# Patient Record
Sex: Male | Born: 1940 | Race: White | Hispanic: No | Marital: Married | Smoking: Former smoker
Health system: Southern US, Community
[De-identification: ages and names within clinical notes are randomized; demographics above are authoritative.]

## PROBLEM LIST (undated history)

## (undated) VITALS — BP 133/73 | HR 70 | Temp 98.0°F | Resp 22 | Ht 68.0 in | Wt 223.6 lb

## (undated) DIAGNOSIS — R519 Headache, unspecified: Secondary | ICD-10-CM

## (undated) DIAGNOSIS — H9190 Unspecified hearing loss, unspecified ear: Secondary | ICD-10-CM

## (undated) DIAGNOSIS — M1990 Unspecified osteoarthritis, unspecified site: Secondary | ICD-10-CM

## (undated) DIAGNOSIS — D649 Anemia, unspecified: Secondary | ICD-10-CM

## (undated) DIAGNOSIS — E119 Type 2 diabetes mellitus without complications: Secondary | ICD-10-CM

## (undated) DIAGNOSIS — I5032 Chronic diastolic (congestive) heart failure: Secondary | ICD-10-CM

## (undated) DIAGNOSIS — I10 Essential (primary) hypertension: Secondary | ICD-10-CM

## (undated) DIAGNOSIS — M791 Myalgia, unspecified site: Secondary | ICD-10-CM

## (undated) DIAGNOSIS — R51 Headache: Secondary | ICD-10-CM

## (undated) DIAGNOSIS — R5383 Other fatigue: Secondary | ICD-10-CM

## (undated) DIAGNOSIS — I219 Acute myocardial infarction, unspecified: Secondary | ICD-10-CM

## (undated) DIAGNOSIS — F329 Major depressive disorder, single episode, unspecified: Secondary | ICD-10-CM

## (undated) DIAGNOSIS — F419 Anxiety disorder, unspecified: Secondary | ICD-10-CM

## (undated) DIAGNOSIS — F32A Depression, unspecified: Secondary | ICD-10-CM

## (undated) DIAGNOSIS — M2040 Other hammer toe(s) (acquired), unspecified foot: Secondary | ICD-10-CM

## (undated) DIAGNOSIS — M109 Gout, unspecified: Secondary | ICD-10-CM

## (undated) DIAGNOSIS — N184 Chronic kidney disease, stage 4 (severe): Secondary | ICD-10-CM

## (undated) DIAGNOSIS — I639 Cerebral infarction, unspecified: Secondary | ICD-10-CM

## (undated) HISTORY — DX: Other fatigue: R53.83

## (undated) HISTORY — DX: Unspecified hearing loss, unspecified ear: H91.90

## (undated) HISTORY — DX: Type 2 diabetes mellitus without complications: E11.9

## (undated) HISTORY — DX: Gout, unspecified: M10.9

## (undated) HISTORY — DX: Chronic diastolic (congestive) heart failure: I50.32

## (undated) HISTORY — DX: Cerebral infarction, unspecified: I63.9

## (undated) HISTORY — DX: Essential (primary) hypertension: I10

## (undated) HISTORY — DX: Other hammer toe(s) (acquired), unspecified foot: M20.40

## (undated) HISTORY — DX: Myalgia, unspecified site: M79.10

## (undated) HISTORY — PX: FOOT SURGERY: SHX648

## (undated) HISTORY — PX: CHOLECYSTECTOMY: SHX55

## (undated) HISTORY — PX: STOMACH SURGERY: SHX791

---

## 2008-02-15 ENCOUNTER — Inpatient Hospital Stay: Payer: Self-pay | Admitting: Internal Medicine

## 2008-02-23 ENCOUNTER — Ambulatory Visit: Payer: Self-pay | Admitting: Family Medicine

## 2008-02-25 ENCOUNTER — Ambulatory Visit: Payer: Self-pay | Admitting: Vascular Surgery

## 2008-03-23 ENCOUNTER — Ambulatory Visit: Payer: Self-pay | Admitting: Family Medicine

## 2008-08-21 ENCOUNTER — Emergency Department: Payer: Self-pay | Admitting: Emergency Medicine

## 2009-03-05 ENCOUNTER — Inpatient Hospital Stay: Payer: Self-pay | Admitting: *Deleted

## 2009-07-25 ENCOUNTER — Ambulatory Visit: Payer: Self-pay | Admitting: Family Medicine

## 2011-11-02 ENCOUNTER — Ambulatory Visit: Payer: Self-pay | Admitting: Family Medicine

## 2012-01-05 LAB — COMPREHENSIVE METABOLIC PANEL
Anion Gap: 9 (ref 7–16)
Bilirubin,Total: 0.3 mg/dL (ref 0.2–1.0)
Calcium, Total: 8.4 mg/dL — ABNORMAL LOW (ref 8.5–10.1)
Co2: 29 mmol/L (ref 21–32)
Creatinine: 1.72 mg/dL — ABNORMAL HIGH (ref 0.60–1.30)
EGFR (Non-African Amer.): 39 — ABNORMAL LOW
Osmolality: 294 (ref 275–301)
Potassium: 4.2 mmol/L (ref 3.5–5.1)
SGOT(AST): 18 U/L (ref 15–37)
SGPT (ALT): 22 U/L (ref 12–78)

## 2012-01-05 LAB — URINALYSIS, COMPLETE
Bacteria: NONE SEEN
Bilirubin,UR: NEGATIVE
Blood: NEGATIVE
Glucose,UR: 150 mg/dL (ref 0–75)
Nitrite: NEGATIVE
Ph: 8 (ref 4.5–8.0)
RBC,UR: 8 /HPF (ref 0–5)
Specific Gravity: 1.017 (ref 1.003–1.030)
Squamous Epithelial: NONE SEEN

## 2012-01-05 LAB — CBC WITH DIFFERENTIAL/PLATELET
Basophil #: 0 10*3/uL (ref 0.0–0.1)
Eosinophil #: 0.1 10*3/uL (ref 0.0–0.7)
HGB: 14 g/dL (ref 13.0–18.0)
Lymphocyte %: 6.5 %
MCH: 30.8 pg (ref 26.0–34.0)
MCHC: 34.8 g/dL (ref 32.0–36.0)
MCV: 89 fL (ref 80–100)
Monocyte %: 10.9 %
Neutrophil #: 5 10*3/uL (ref 1.4–6.5)
Platelet: 193 10*3/uL (ref 150–440)
RDW: 13.3 % (ref 11.5–14.5)
WBC: 6.3 10*3/uL (ref 3.8–10.6)

## 2012-01-05 LAB — LIPASE, BLOOD: Lipase: 113 U/L (ref 73–393)

## 2012-01-06 ENCOUNTER — Observation Stay: Payer: Self-pay | Admitting: General Surgery

## 2012-01-06 LAB — CBC WITH DIFFERENTIAL/PLATELET
Eosinophil #: 0.1 10*3/uL (ref 0.0–0.7)
Eosinophil %: 1.9 %
HCT: 35.7 % — ABNORMAL LOW (ref 40.0–52.0)
Lymphocyte #: 0.4 10*3/uL — ABNORMAL LOW (ref 1.0–3.6)
MCH: 31.1 pg (ref 26.0–34.0)
MCV: 88 fL (ref 80–100)
Monocyte #: 0.4 x10 3/mm (ref 0.2–1.0)
Neutrophil #: 5.3 10*3/uL (ref 1.4–6.5)
Neutrophil %: 84.8 %
Platelet: 180 10*3/uL (ref 150–440)
RBC: 4.06 10*6/uL — ABNORMAL LOW (ref 4.40–5.90)
RDW: 13.3 % (ref 11.5–14.5)

## 2012-01-06 LAB — CREATININE, SERUM
Creatinine: 1.88 mg/dL — ABNORMAL HIGH (ref 0.60–1.30)
EGFR (African American): 41 — ABNORMAL LOW

## 2012-01-07 DIAGNOSIS — I059 Rheumatic mitral valve disease, unspecified: Secondary | ICD-10-CM

## 2012-01-07 LAB — BASIC METABOLIC PANEL
Anion Gap: 9 (ref 7–16)
Calcium, Total: 7.6 mg/dL — ABNORMAL LOW (ref 8.5–10.1)
Chloride: 107 mmol/L (ref 98–107)
Co2: 25 mmol/L (ref 21–32)
EGFR (Non-African Amer.): 39 — ABNORMAL LOW
Osmolality: 286 (ref 275–301)
Potassium: 3.9 mmol/L (ref 3.5–5.1)

## 2012-01-07 LAB — LIPID PANEL
Cholesterol: 221 mg/dL — ABNORMAL HIGH (ref 0–200)
HDL Cholesterol: 31 mg/dL — ABNORMAL LOW (ref 40–60)
Triglycerides: 232 mg/dL — ABNORMAL HIGH (ref 0–200)

## 2012-01-07 LAB — CBC WITH DIFFERENTIAL/PLATELET
Basophil %: 1.3 %
Eosinophil #: 0.2 10*3/uL (ref 0.0–0.7)
HCT: 35.6 % — ABNORMAL LOW (ref 40.0–52.0)
HGB: 12.7 g/dL — ABNORMAL LOW (ref 13.0–18.0)
Lymphocyte %: 16.1 %
MCHC: 35.6 g/dL (ref 32.0–36.0)
Monocyte #: 0.6 x10 3/mm (ref 0.2–1.0)
Monocyte %: 13.1 %
Neutrophil %: 65.6 %
Platelet: 161 10*3/uL (ref 150–440)
RBC: 3.97 10*6/uL — ABNORMAL LOW (ref 4.40–5.90)
WBC: 4.5 10*3/uL (ref 3.8–10.6)

## 2012-01-31 ENCOUNTER — Ambulatory Visit: Payer: Self-pay | Admitting: Family Medicine

## 2013-03-20 ENCOUNTER — Ambulatory Visit: Payer: Self-pay | Admitting: Podiatry

## 2013-04-17 ENCOUNTER — Ambulatory Visit: Payer: Medicare Other | Admitting: Podiatry

## 2013-04-30 ENCOUNTER — Encounter: Payer: Self-pay | Admitting: Podiatry

## 2013-05-01 ENCOUNTER — Encounter: Payer: Self-pay | Admitting: Podiatry

## 2013-05-01 ENCOUNTER — Ambulatory Visit (INDEPENDENT_AMBULATORY_CARE_PROVIDER_SITE_OTHER): Payer: Medicare Other | Admitting: Podiatry

## 2013-05-01 VITALS — BP 177/98 | HR 60 | Resp 16 | Ht 68.0 in | Wt 225.0 lb

## 2013-05-01 DIAGNOSIS — B351 Tinea unguium: Secondary | ICD-10-CM

## 2013-05-01 DIAGNOSIS — M79609 Pain in unspecified limb: Secondary | ICD-10-CM

## 2013-05-01 NOTE — Progress Notes (Signed)
Subjective:     Patient ID: Darrell Richardson, male   DOB: [birth year], 73 y.o.   MRN: [id number]  HPI patient presents with thick incurvated nail beds that are painful 1-5 both feet   Review of Systems     Objective:   Physical Exam Patient's nailbeds are thick and painful when pressed from a dorsal direction 1-5 both feet    Assessment:     Mycotic nail infection with pain 1-5 both feet    Plan:     Debridement painful nail bed 1-5 of both feet with no bleeding noted

## 2013-07-31 ENCOUNTER — Ambulatory Visit: Payer: Medicare Other | Admitting: Podiatry

## 2013-08-13 ENCOUNTER — Ambulatory Visit: Payer: Self-pay | Admitting: Family Medicine

## 2013-11-05 ENCOUNTER — Ambulatory Visit: Payer: Self-pay | Admitting: Family Medicine

## 2014-01-22 ENCOUNTER — Other Ambulatory Visit (HOSPITAL_COMMUNITY): Payer: Self-pay | Admitting: Orthopedic Surgery

## 2014-01-22 DIAGNOSIS — D2110 Benign neoplasm of connective and other soft tissue of unspecified upper limb, including shoulder: Secondary | ICD-10-CM

## 2014-02-02 DIAGNOSIS — I219 Acute myocardial infarction, unspecified: Secondary | ICD-10-CM | POA: Insufficient documentation

## 2014-02-02 DIAGNOSIS — R1030 Lower abdominal pain, unspecified: Secondary | ICD-10-CM | POA: Insufficient documentation

## 2014-02-02 DIAGNOSIS — M109 Gout, unspecified: Secondary | ICD-10-CM | POA: Insufficient documentation

## 2014-02-02 DIAGNOSIS — E559 Vitamin D deficiency, unspecified: Secondary | ICD-10-CM | POA: Insufficient documentation

## 2014-02-02 DIAGNOSIS — E119 Type 2 diabetes mellitus without complications: Secondary | ICD-10-CM | POA: Insufficient documentation

## 2014-02-02 DIAGNOSIS — I639 Cerebral infarction, unspecified: Secondary | ICD-10-CM | POA: Insufficient documentation

## 2014-02-02 DIAGNOSIS — D239 Other benign neoplasm of skin, unspecified: Secondary | ICD-10-CM | POA: Insufficient documentation

## 2014-02-15 ENCOUNTER — Encounter: Payer: Self-pay | Admitting: Neurology

## 2014-02-23 ENCOUNTER — Ambulatory Visit (HOSPITAL_COMMUNITY): Admission: RE | Admit: 2014-02-23 | Payer: Medicare Other | Source: Ambulatory Visit

## 2014-02-23 ENCOUNTER — Encounter (HOSPITAL_COMMUNITY): Admission: RE | Payer: Self-pay | Source: Ambulatory Visit

## 2014-02-23 ENCOUNTER — Other Ambulatory Visit (HOSPITAL_COMMUNITY): Payer: Medicare Other

## 2014-02-23 SURGERY — RADIOLOGY WITH ANESTHESIA
Anesthesia: General | Site: Shoulder | Laterality: Left

## 2014-03-09 ENCOUNTER — Encounter: Payer: Self-pay | Admitting: Neurology

## 2014-03-25 ENCOUNTER — Ambulatory Visit: Payer: Self-pay | Admitting: Family Medicine

## 2014-03-30 ENCOUNTER — Ambulatory Visit (HOSPITAL_COMMUNITY): Payer: Medicare Other

## 2014-03-30 ENCOUNTER — Other Ambulatory Visit (HOSPITAL_COMMUNITY): Payer: Medicare Other

## 2014-05-31 ENCOUNTER — Inpatient Hospital Stay (HOSPITAL_COMMUNITY): Payer: Medicare Other

## 2014-05-31 ENCOUNTER — Emergency Department: Payer: Self-pay | Admitting: Emergency Medicine

## 2014-05-31 ENCOUNTER — Encounter (HOSPITAL_COMMUNITY): Payer: Self-pay | Admitting: Internal Medicine

## 2014-05-31 ENCOUNTER — Inpatient Hospital Stay (HOSPITAL_COMMUNITY)
Admission: AD | Admit: 2014-05-31 | Discharge: 2014-06-11 | DRG: 291 | Disposition: A | Discharge status: Rehabilitation Facility | Payer: Medicare Other | Source: Other Acute Inpatient Hospital | Attending: Internal Medicine | Admitting: Internal Medicine

## 2014-05-31 DIAGNOSIS — T368X5A Adverse effect of other systemic antibiotics, initial encounter: Secondary | ICD-10-CM | POA: Diagnosis not present

## 2014-05-31 DIAGNOSIS — N189 Chronic kidney disease, unspecified: Secondary | ICD-10-CM

## 2014-05-31 DIAGNOSIS — M10041 Idiopathic gout, right hand: Secondary | ICD-10-CM | POA: Diagnosis present

## 2014-05-31 DIAGNOSIS — J9601 Acute respiratory failure with hypoxia: Secondary | ICD-10-CM

## 2014-05-31 DIAGNOSIS — R634 Abnormal weight loss: Secondary | ICD-10-CM

## 2014-05-31 DIAGNOSIS — Z7902 Long term (current) use of antithrombotics/antiplatelets: Secondary | ICD-10-CM

## 2014-05-31 DIAGNOSIS — N184 Chronic kidney disease, stage 4 (severe): Secondary | ICD-10-CM | POA: Diagnosis present

## 2014-05-31 DIAGNOSIS — N183 Chronic kidney disease, stage 3 (moderate): Secondary | ICD-10-CM

## 2014-05-31 DIAGNOSIS — R29898 Other symptoms and signs involving the musculoskeletal system: Secondary | ICD-10-CM | POA: Diagnosis not present

## 2014-05-31 DIAGNOSIS — R338 Other retention of urine: Secondary | ICD-10-CM | POA: Diagnosis not present

## 2014-05-31 DIAGNOSIS — M791 Myalgia, unspecified site: Secondary | ICD-10-CM | POA: Diagnosis present

## 2014-05-31 DIAGNOSIS — IMO0002 Reserved for concepts with insufficient information to code with codable children: Secondary | ICD-10-CM | POA: Diagnosis present

## 2014-05-31 DIAGNOSIS — J111 Influenza due to unidentified influenza virus with other respiratory manifestations: Secondary | ICD-10-CM

## 2014-05-31 DIAGNOSIS — I129 Hypertensive chronic kidney disease with stage 1 through stage 4 chronic kidney disease, or unspecified chronic kidney disease: Secondary | ICD-10-CM | POA: Diagnosis not present

## 2014-05-31 DIAGNOSIS — I639 Cerebral infarction, unspecified: Secondary | ICD-10-CM | POA: Diagnosis present

## 2014-05-31 DIAGNOSIS — I420 Dilated cardiomyopathy: Secondary | ICD-10-CM | POA: Diagnosis not present

## 2014-05-31 DIAGNOSIS — J9621 Acute and chronic respiratory failure with hypoxia: Secondary | ICD-10-CM | POA: Diagnosis not present

## 2014-05-31 DIAGNOSIS — I5042 Chronic combined systolic (congestive) and diastolic (congestive) heart failure: Secondary | ICD-10-CM | POA: Diagnosis not present

## 2014-05-31 DIAGNOSIS — I255 Ischemic cardiomyopathy: Secondary | ICD-10-CM | POA: Diagnosis present

## 2014-05-31 DIAGNOSIS — I159 Secondary hypertension, unspecified: Secondary | ICD-10-CM

## 2014-05-31 DIAGNOSIS — I429 Cardiomyopathy, unspecified: Secondary | ICD-10-CM | POA: Diagnosis present

## 2014-05-31 DIAGNOSIS — M2550 Pain in unspecified joint: Secondary | ICD-10-CM | POA: Diagnosis present

## 2014-05-31 DIAGNOSIS — E1122 Type 2 diabetes mellitus with diabetic chronic kidney disease: Secondary | ICD-10-CM

## 2014-05-31 DIAGNOSIS — I10 Essential (primary) hypertension: Secondary | ICD-10-CM

## 2014-05-31 DIAGNOSIS — R21 Rash and other nonspecific skin eruption: Secondary | ICD-10-CM | POA: Diagnosis not present

## 2014-05-31 DIAGNOSIS — Z79899 Other long term (current) drug therapy: Secondary | ICD-10-CM

## 2014-05-31 DIAGNOSIS — Z8673 Personal history of transient ischemic attack (TIA), and cerebral infarction without residual deficits: Secondary | ICD-10-CM | POA: Diagnosis present

## 2014-05-31 DIAGNOSIS — Z7951 Long term (current) use of inhaled steroids: Secondary | ICD-10-CM

## 2014-05-31 DIAGNOSIS — Z87891 Personal history of nicotine dependence: Secondary | ICD-10-CM

## 2014-05-31 DIAGNOSIS — E119 Type 2 diabetes mellitus without complications: Secondary | ICD-10-CM

## 2014-05-31 DIAGNOSIS — I69354 Hemiplegia and hemiparesis following cerebral infarction affecting left non-dominant side: Secondary | ICD-10-CM

## 2014-05-31 DIAGNOSIS — I5022 Chronic systolic (congestive) heart failure: Secondary | ICD-10-CM

## 2014-05-31 DIAGNOSIS — E1165 Type 2 diabetes mellitus with hyperglycemia: Secondary | ICD-10-CM | POA: Diagnosis present

## 2014-05-31 DIAGNOSIS — E871 Hypo-osmolality and hyponatremia: Secondary | ICD-10-CM | POA: Diagnosis not present

## 2014-05-31 DIAGNOSIS — O10019 Pre-existing essential hypertension complicating pregnancy, unspecified trimester: Secondary | ICD-10-CM | POA: Diagnosis present

## 2014-05-31 DIAGNOSIS — I509 Heart failure, unspecified: Secondary | ICD-10-CM | POA: Diagnosis present

## 2014-05-31 DIAGNOSIS — E875 Hyperkalemia: Secondary | ICD-10-CM | POA: Diagnosis not present

## 2014-05-31 DIAGNOSIS — Z515 Encounter for palliative care: Secondary | ICD-10-CM

## 2014-05-31 DIAGNOSIS — I959 Hypotension, unspecified: Secondary | ICD-10-CM | POA: Diagnosis not present

## 2014-05-31 DIAGNOSIS — H9190 Unspecified hearing loss, unspecified ear: Secondary | ICD-10-CM | POA: Diagnosis present

## 2014-05-31 DIAGNOSIS — E43 Unspecified severe protein-calorie malnutrition: Secondary | ICD-10-CM | POA: Diagnosis present

## 2014-05-31 DIAGNOSIS — N179 Acute kidney failure, unspecified: Secondary | ICD-10-CM | POA: Diagnosis not present

## 2014-05-31 DIAGNOSIS — I5033 Acute on chronic diastolic (congestive) heart failure: Secondary | ICD-10-CM | POA: Diagnosis not present

## 2014-05-31 DIAGNOSIS — I5043 Acute on chronic combined systolic (congestive) and diastolic (congestive) heart failure: Principal | ICD-10-CM | POA: Diagnosis present

## 2014-05-31 DIAGNOSIS — I5023 Acute on chronic systolic (congestive) heart failure: Secondary | ICD-10-CM | POA: Diagnosis not present

## 2014-05-31 DIAGNOSIS — R0602 Shortness of breath: Secondary | ICD-10-CM

## 2014-05-31 DIAGNOSIS — M1000 Idiopathic gout, unspecified site: Secondary | ICD-10-CM | POA: Diagnosis present

## 2014-05-31 DIAGNOSIS — R509 Fever, unspecified: Secondary | ICD-10-CM | POA: Diagnosis present

## 2014-05-31 DIAGNOSIS — M109 Gout, unspecified: Secondary | ICD-10-CM | POA: Diagnosis not present

## 2014-05-31 DIAGNOSIS — I2510 Atherosclerotic heart disease of native coronary artery without angina pectoris: Secondary | ICD-10-CM | POA: Diagnosis not present

## 2014-05-31 DIAGNOSIS — R52 Pain, unspecified: Secondary | ICD-10-CM

## 2014-05-31 DIAGNOSIS — R5381 Other malaise: Secondary | ICD-10-CM | POA: Diagnosis not present

## 2014-05-31 LAB — PROTIME-INR
INR: 1.14 (ref 0.00–1.49)
Prothrombin Time: 14.7 seconds (ref 11.6–15.2)

## 2014-05-31 LAB — GLUCOSE, CAPILLARY
GLUCOSE-CAPILLARY: 134 mg/dL — AB (ref 70–99)
Glucose-Capillary: 125 mg/dL — ABNORMAL HIGH (ref 70–99)
Glucose-Capillary: 132 mg/dL — ABNORMAL HIGH (ref 70–99)

## 2014-05-31 LAB — TSH: TSH: 1.945 u[IU]/mL (ref 0.350–4.500)

## 2014-05-31 LAB — MRSA PCR SCREENING: MRSA by PCR: NEGATIVE

## 2014-05-31 MED ORDER — SODIUM CHLORIDE 0.9 % IV SOLN
INTRAVENOUS | Status: DC | PRN
  Administered 2014-05-31: 17:00:00 via INTRAVENOUS
  Administered 2014-06-02: 1000 mL via INTRAVENOUS

## 2014-05-31 MED ORDER — CEFTRIAXONE SODIUM IN DEXTROSE 40 MG/ML IV SOLN
2.0000 g | INTRAVENOUS | Status: DC
  Administered 2014-06-01: 2 g via INTRAVENOUS
  Filled 2014-05-31: qty 50

## 2014-05-31 MED ORDER — MORPHINE SULFATE 2 MG/ML IJ SOLN
1.0000 mg | INTRAMUSCULAR | Status: DC | PRN
  Administered 2014-06-05 – 2014-06-10 (×6): 1 mg via INTRAVENOUS
  Filled 2014-05-31 (×7): qty 1

## 2014-05-31 MED ORDER — ATORVASTATIN CALCIUM 40 MG PO TABS
40.0000 mg | ORAL_TABLET | Freq: Every day | ORAL | Status: DC
  Administered 2014-05-31 – 2014-06-03 (×4): 40 mg via ORAL
  Filled 2014-05-31 (×5): qty 1

## 2014-05-31 MED ORDER — HYDRALAZINE HCL 20 MG/ML IJ SOLN
10.0000 mg | Freq: Four times a day (QID) | INTRAMUSCULAR | Status: DC | PRN
  Administered 2014-05-31 – 2014-06-02 (×4): 10 mg via INTRAVENOUS
  Filled 2014-05-31 (×4): qty 1

## 2014-05-31 MED ORDER — FUROSEMIDE 10 MG/ML IJ SOLN
80.0000 mg | Freq: Two times a day (BID) | INTRAMUSCULAR | Status: DC
  Administered 2014-05-31: 80 mg via INTRAVENOUS
  Filled 2014-05-31 (×3): qty 8

## 2014-05-31 MED ORDER — CARVEDILOL 3.125 MG PO TABS
3.1250 mg | ORAL_TABLET | Freq: Two times a day (BID) | ORAL | Status: DC
  Filled 2014-05-31 (×2): qty 1

## 2014-05-31 MED ORDER — HEPARIN SODIUM (PORCINE) 5000 UNIT/ML IJ SOLN
5000.0000 [IU] | Freq: Three times a day (TID) | INTRAMUSCULAR | Status: DC
Start: 2014-05-31 — End: 2014-06-11
  Administered 2014-05-31 – 2014-06-11 (×34): 5000 [IU] via SUBCUTANEOUS
  Filled 2014-05-31 (×37): qty 1

## 2014-05-31 MED ORDER — DIPHENHYDRAMINE HCL 25 MG PO CAPS
25.0000 mg | ORAL_CAPSULE | Freq: Four times a day (QID) | ORAL | Status: DC | PRN
  Administered 2014-05-31 – 2014-06-01 (×2): 25 mg via ORAL
  Filled 2014-05-31 (×2): qty 1

## 2014-05-31 MED ORDER — DEXTROSE 5 % IV SOLN
500.0000 mg | INTRAVENOUS | Status: AC
  Administered 2014-06-01: 500 mg via INTRAVENOUS
  Filled 2014-05-31: qty 500

## 2014-05-31 MED ORDER — ACETAMINOPHEN 650 MG RE SUPP: 650.0000 mg | Freq: Four times a day (QID) | RECTAL | Status: DC | PRN

## 2014-05-31 MED ORDER — CLOPIDOGREL BISULFATE 75 MG PO TABS
75.0000 mg | ORAL_TABLET | Freq: Every day | ORAL | Status: DC
  Administered 2014-05-31 – 2014-06-11 (×12): 75 mg via ORAL
  Filled 2014-05-31 (×13): qty 1

## 2014-05-31 MED ORDER — HYDRALAZINE HCL 50 MG PO TABS
50.0000 mg | ORAL_TABLET | Freq: Three times a day (TID) | ORAL | Status: DC
  Administered 2014-05-31 – 2014-06-02 (×6): 50 mg via ORAL
  Filled 2014-05-31 (×9): qty 1

## 2014-05-31 MED ORDER — NITROGLYCERIN IN D5W 200-5 MCG/ML-% IV SOLN
0.0000 ug/min | INTRAVENOUS | Status: DC
  Administered 2014-05-31: 20 ug/min via INTRAVENOUS
  Administered 2014-06-01: 45 ug/min via INTRAVENOUS
  Administered 2014-06-01: 60 ug/min via INTRAVENOUS
  Filled 2014-05-31 (×3): qty 250

## 2014-05-31 MED ORDER — ONDANSETRON HCL 4 MG/2ML IJ SOLN
4.0000 mg | Freq: Four times a day (QID) | INTRAMUSCULAR | Status: DC | PRN
  Administered 2014-06-01 – 2014-06-04 (×5): 4 mg via INTRAVENOUS
  Filled 2014-05-31 (×5): qty 2

## 2014-05-31 MED ORDER — INSULIN ASPART 100 UNIT/ML ~~LOC~~ SOLN
0.0000 [IU] | Freq: Three times a day (TID) | SUBCUTANEOUS | Status: DC
  Administered 2014-05-31 – 2014-06-01 (×4): 1 [IU] via SUBCUTANEOUS
  Administered 2014-06-02 – 2014-06-03 (×3): 2 [IU] via SUBCUTANEOUS
  Administered 2014-06-03: 3 [IU] via SUBCUTANEOUS
  Administered 2014-06-03 – 2014-06-04 (×2): 2 [IU] via SUBCUTANEOUS
  Administered 2014-06-04 (×2): 3 [IU] via SUBCUTANEOUS
  Administered 2014-06-05 – 2014-06-06 (×5): 2 [IU] via SUBCUTANEOUS
  Administered 2014-06-06: 5 [IU] via SUBCUTANEOUS

## 2014-05-31 MED ORDER — HYDROCODONE-ACETAMINOPHEN 5-325 MG PO TABS
1.0000 | ORAL_TABLET | ORAL | Status: DC | PRN
  Administered 2014-06-03 – 2014-06-04 (×2): 1 via ORAL
  Administered 2014-06-04: 2 via ORAL
  Administered 2014-06-04: 1 via ORAL
  Administered 2014-06-04 – 2014-06-10 (×8): 2 via ORAL
  Administered 2014-06-11: 1 via ORAL
  Filled 2014-05-31 (×3): qty 2
  Filled 2014-05-31: qty 1
  Filled 2014-05-31 (×2): qty 2
  Filled 2014-05-31 (×2): qty 1
  Filled 2014-05-31 (×2): qty 2
  Filled 2014-05-31: qty 1
  Filled 2014-05-31 (×3): qty 2

## 2014-05-31 MED ORDER — HYDRALAZINE HCL 25 MG PO TABS: 25.0000 mg | ORAL_TABLET | Freq: Three times a day (TID) | ORAL | Status: DC

## 2014-05-31 MED ORDER — ACETAMINOPHEN 325 MG PO TABS
650.0000 mg | ORAL_TABLET | Freq: Four times a day (QID) | ORAL | Status: DC | PRN
  Administered 2014-05-31 – 2014-06-03 (×6): 650 mg via ORAL
  Administered 2014-06-04: 325 mg via ORAL
  Filled 2014-05-31 (×5): qty 2

## 2014-05-31 MED ORDER — METOLAZONE 2.5 MG PO TABS
2.5000 mg | ORAL_TABLET | ORAL | Status: AC
  Administered 2014-05-31: 2.5 mg via ORAL
  Filled 2014-05-31: qty 1

## 2014-05-31 MED ORDER — CETYLPYRIDINIUM CHLORIDE 0.05 % MT LIQD
7.0000 mL | Freq: Two times a day (BID) | OROMUCOSAL | Status: DC
  Administered 2014-05-31 – 2014-06-11 (×21): 7 mL via OROMUCOSAL

## 2014-05-31 MED ORDER — SODIUM CHLORIDE 0.9 % IJ SOLN
3.0000 mL | Freq: Two times a day (BID) | INTRAMUSCULAR | Status: DC
  Administered 2014-05-31 – 2014-06-11 (×21): 3 mL via INTRAVENOUS

## 2014-05-31 MED ORDER — HYDRALAZINE HCL 25 MG PO TABS
25.0000 mg | ORAL_TABLET | Freq: Three times a day (TID) | ORAL | Status: DC
  Filled 2014-05-31 (×3): qty 1

## 2014-05-31 MED ORDER — ISOSORBIDE MONONITRATE ER 30 MG PO TB24
30.0000 mg | ORAL_TABLET | Freq: Every day | ORAL | Status: DC
  Administered 2014-05-31: 30 mg via ORAL
  Filled 2014-05-31: qty 1

## 2014-05-31 NOTE — H&P (Signed)
[facility] History and Physical  Darrell Richardson NUU:[id number] DOB: [birth year] DOA: 05/31/2014  Referring physician: Selena Lesser regional PCP: [name], MD   Chief Complaint: SOB  HPI: Darrell Richardson is a 74 y.o. male with past medical history of diabetes, CHF, CKD and CVA came in to [facility] complaining about shortness of breath. They don't have beds and stepdown so they transferred him to [facility] for further medical evaluation. This gentleman gets his cardiac care through her clinic, since last night he was complaining about shortness of breath, orthopnea, increased lower extremity edema he couldn't sleep very well so he came into the hospital in a.m. Was found to be in acute respiratory failure secondary to acute CHF exacerbation, placed on BiPAP and sent to [facility] for further evaluation. In the ED troponin was negative, creatinine 2.44, hemoglobin 13.3. BNP is 7363 (reference value 0-125 pg/mL). Patient was given 1 dose of Lasix and sent for further evaluation.  Review of Systems:  Constitutional: negative for anorexia, fevers and sweats Eyes: negative for irritation, redness and visual disturbance Ears, nose, mouth, throat, and face: negative for earaches, epistaxis, nasal congestion and sore throat Respiratory: negative for cough, dyspnea on exertion, sputum and wheezing Cardiovascular: Per history of present illness Gastrointestinal: negative for abdominal pain, constipation, diarrhea, melena, nausea and vomiting Genitourinary:negative for dysuria, frequency and hematuria Hematologic/lymphatic: negative for bleeding, easy bruising and lymphadenopathy Musculoskeletal:negative for arthralgias, muscle weakness and stiff joints Neurological: negative for coordination problems, gait problems, headaches and weakness Endocrine: negative for diabetic symptoms including polydipsia, polyuria and weight loss Allergic/Immunologic: negative for  anaphylaxis, hay fever and urticaria  Past Medical History  Diagnosis Date  . Hearing loss   . Gout   . Diabetes   . Fatigue   . Hypertension   . Stroke     X 2  . Heart failure   . Muscle pain   . Hammertoe    Past Surgical History  Procedure Laterality Date  . Stomach surgery    . Foot surgery Right    Social History:   reports that he has quit smoking. He does not have any smokeless tobacco history on file. He reports that he drinks alcohol. His drug history is not on file.  Allergies  Allergen Reactions  . Cortisone Other (See Comments)    Cold, elevated BP, shaking all over and fever   Family history: Father died of age of 54 in 4 War II, mother alive at age of 55 no issues apart from dementia   Prior to Admission medications   Medication Sig Start Date End Date Taking? Authorizing Provider  albuterol (PROAIR HFA) 108 (90 BASE) MCG/ACT inhaler Inhale 1-2 puffs four times daily as needed    Historical Provider, MD  allopurinol (ZYLOPRIM) 100 MG tablet Take 100 mg by mouth 2 (two) times daily.    Historical Provider, MD  atorvastatin (LIPITOR) 20 MG tablet Take 20 mg by mouth daily.    Historical Provider, MD  clopidogrel (PLAVIX) 75 MG tablet Take 75 mg by mouth daily with breakfast.    Historical Provider, MD  furosemide (LASIX) 80 MG tablet Take 80 mg by mouth daily.    Historical Provider, MD  HYDROcodone-acetaminophen (VICODIN ES) 7.5-750 MG per tablet Take 1/2 - 1 four times a day as needed    Historical Provider, MD  losartan (COZAAR) 50 MG tablet Take 50 mg by mouth daily.    Historical Provider, MD  metoprolol succinate (TOPROL-XL) 25  MG 24 hr tablet Take 25 mg by mouth daily.    Historical Provider, MD  Multiple Vitamin (MULTIVITAMIN) tablet Take 1 tablet by mouth daily.    Historical Provider, MD  NON FORMULARY Stool softner 100 mg, take one by mouth a bedtime    Historical Provider, MD  pregabalin (LYRICA) 75 MG capsule Take 75 mg by mouth 2 (two) times  daily.    Historical Provider, MD  Psyllium (METAMUCIL PO) Take by mouth at bedtime.    Historical Provider, MD  saxagliptin HCl (ONGLYZA) 5 MG TABS tablet Take by mouth daily.    Historical Provider, MD  sertraline (ZOLOFT) 50 MG tablet Take 50 mg by mouth daily.    Historical Provider, MD  vitamin E 200 UNIT capsule Take 200 Units by mouth daily.    Historical Provider, MD   Physical Exam: Filed Vitals:   05/31/14 1130  BP: 182/69  Pulse: 62  Temp: 98.8 F (37.1 C)  Resp:    Constitutional: Oriented to person, place, and time. Well-developed and well-nourished. Cooperative.  Head: Normocephalic and atraumatic.  Nose: Nose normal.  Mouth/Throat: Uvula is midline, oropharynx is clear and moist and mucous membranes are normal.  Eyes: Conjunctivae and EOM are normal. Pupils are equal, round, and reactive to light.  Neck: Trachea normal and normal range of motion. Neck supple.  Cardiovascular: Normal rate, regular rhythm, S1 normal, S2 normal, normal heart sounds and intact distal pulses.   Pulmonary/Chest: Effort normal and breath sounds normal.  Abdominal: Soft. Bowel sounds are normal. There is no hepatosplenomegaly. There is no tenderness.  Musculoskeletal: Normal range of motion.  Neurological: Alert and oriented to person, place, and time. Has normal strength. No cranial nerve deficit or sensory deficit.  Skin: Skin is warm, dry and intact.  Psychiatric: Has a normal mood and affect. Speech is normal and behavior is normal.   Labs on Admission:  Basic Metabolic Panel: No results for input(s): NA, K, CL, CO2, GLUCOSE, BUN, CREATININE, CALCIUM, MG, PHOS in the last 168 hours. Liver Function Tests: No results for input(s): AST, ALT, ALKPHOS, BILITOT, PROT, ALBUMIN in the last 168 hours. No results for input(s): LIPASE, AMYLASE in the last 168 hours. No results for input(s): AMMONIA in the last 168 hours. CBC: No results for input(s): WBC, NEUTROABS, HGB, HCT, MCV, PLT in the last  168 hours. Cardiac Enzymes: No results for input(s): CKTOTAL, CKMB, CKMBINDEX, TROPONINI in the last 168 hours.  BNP (last 3 results) No results for input(s): BNP in the last 8760 hours.  ProBNP (last 3 results) No results for input(s): PROBNP in the last 8760 hours.  CBG: No results for input(s): GLUCAP in the last 168 hours.  Radiological Exams on Admission: No results found.  EKG: Independently reviewed.   Assessment/Plan Principal Problem:   CHF, acute on chronic Active Problems:   Diabetes   Hypertension   Stroke   Acute respiratory failure with hypoxia   CKD (chronic kidney disease), stage III    Acute on chronic CHF Patient presented with SOB, orthopnea, lower extremity edema and BNP of 7300. Placed on BiPAP. Started on Lasix 80 mg twice a day, he might need more than that, monitor intake/output and daily weights. Was on Toprol-XL, switched to Coreg 3.125. Losartan held because of worsening creatinine.  Acute on chronic respiratory failure with hypoxia Secondary to acute CHF, get chest x-ray for baseline. No fever or symptoms of pneumonia, likely this is secondary to CHF. Currently on BiPAP, wean as tolerated.  Hypertension Uncontrolled, along with acute CHF, started on Coreg, hydralazine as needed.  Diabetes mellitus type 2 Check hemoglobin A1c, started on carbohydrate modified diet. Hold oral hypoglycemics, insulin sliding scale.    CKD stage III Per family baseline creatinine between 2.5 and 2.8, currently at baseline with creatinine of 2.44. Likely creatinine will go up with the aggressive diuresis, monitor closely, check BMP in a.m.  History of CVA This is stable, patient is on Plavix, continued.  Code Status: Full code Family Communication: Plan discussed with the patient in the presence of his wife and son/daughter at bedside Disposition Plan: Stepdown  Time spent: 52 minutes  [facility] Pager [phone]

## 2014-05-31 NOTE — Consult Note (Addendum)
Advanced Heart Failure Team Consult Note  Referring Physician: Dr Hartford Poli Primary Physician: Primary Cardiologist:  Dr Marthe Patch  Reason for Consultation: Heart Failure   HPI:    [name] is a 74 year old with a history of diabetes, CHF (EF unknown), CKD creatnine baseline 2.5, and CVA 2009-2010.  Previously followed by Dr. Saralyn Pilar for HF. Has not seen in several years. Denies h/o cath or previous stress test.   Has been doing well. Quite active. About two weeks ago had an hour episode of severe CP that felt like someone was standing on his chest. Resolved spontaneously. Over past several days increasing dyspnea, orthopnea and PND. No CP. Weight unchanged. Has not missed his meds. Weight at home 233-234 pounds.   Presented to [facility] with severe respiratory distress. CXR showed questionable pneumonia but not edema. He was given IV lasix, levaquin, as well as BiPap due to increased dyspnea. Bloodd cultres obtained. He was transferred to [facility] for further evaluation of HF because stepdown beds not available at [facility] .  Given 80 IV lasix and now on 4L [place].  [facility] labs: Creatinine 2.4 , K 4.4   BNP 7363, Troponin 0.04 Hgb 13.3  CXR; At [facility]- no edema with questionable pneumonia.    Review of Systems: [y] = yes, [ ]  = no   General: Weight gain [Y ]; Weight loss [ ] ; Anorexia [ ] ; Fatigue [Y ]; Fever [ ] ; Chills [ ] ; Weakness [ ]   Cardiac: Chest pain/pressure [ ] ; Resting SOB [ ] ; Exertional SOB [ Y]; Orthopnea [Y ]; Pedal Edema [Y ]; Palpitations [ ] ; Syncope [ ] ; Presyncope [ ] ; Paroxysmal nocturnal dyspnea[ ]   Pulmonary: Cough [ ] ; Wheezing[ ] ; Hemoptysis[ ] ; Sputum [ ] ; Snoring [ Y]  GI: Vomiting[ ] ; Dysphagia[ ] ; Melena[ ] ; Hematochezia [ ] ; Heartburn[ ] ; Abdominal pain [ ] ; Constipation [ ] ; Diarrhea [ ] ; BRBPR [ ]   GU: Hematuria[ ] ; Dysuria [ ] ; Nocturia[ ]   Vascular: Pain in legs with walking [ ] ; Pain in feet with lying flat [ ] ; Non-healing sores [ ] ; Stroke [ ] ; TIA [ ] ; Slurred speech [ ] ;   Neuro: Headaches[ ] ; Vertigo[ ] ; Seizures[ ] ; Paresthesias[ ] ;Blurred vision [ ] ; Diplopia [ ] ; Vision changes [ ]   Ortho/Skin: Arthritis [ ] ; Joint pain [ Y]; Muscle pain [ ] ; Joint swelling [ ] ; Back Pain [ ] ; Rash [ ]   Psych: Depression[ ] ; Anxiety[ ]   Heme: Bleeding problems [ ] ; Clotting disorders [ ] ; Anemia [ ]   Endocrine: Diabetes [ Y]; Thyroid dysfunction[ ]   Home Medications Prior to Admission medications   Medication Sig Start Date End Date Taking? Authorizing Provider  albuterol (PROAIR HFA) 108 (90 BASE) MCG/ACT inhaler Inhale 1-2 puffs four times daily as needed    Historical Provider, MD  allopurinol (ZYLOPRIM) 100 MG tablet Take 100 mg by mouth 2 (two) times daily.    Historical Provider, MD  atorvastatin (LIPITOR) 20 MG tablet Take 20 mg by mouth daily.    Historical Provider, MD  clopidogrel (PLAVIX) 75 MG tablet Take 75 mg by mouth daily with breakfast.    Historical Provider, MD  furosemide (LASIX) 80 MG tablet Take 80 mg by mouth daily.    Historical Provider, MD  HYDROcodone-acetaminophen (VICODIN ES) 7.5-750 MG per tablet Take 1/2 - 1 four times a day as needed    Historical Provider, MD  losartan (COZAAR) 50 MG tablet Take 50 mg by mouth daily.    Historical Provider, MD  metoprolol  succinate (TOPROL-XL) 25 MG 24 hr tablet Take 25 mg by mouth daily.    Historical Provider, MD  Multiple Vitamin (MULTIVITAMIN) tablet Take 1 tablet by mouth daily.    Historical Provider, MD  NON FORMULARY Stool softner 100 mg, take one by mouth a bedtime    Historical Provider, MD  pregabalin (LYRICA) 75 MG capsule Take 75 mg by mouth 2 (two) times daily.    Historical Provider, MD  Psyllium (METAMUCIL PO) Take by mouth at bedtime.    Historical Provider, MD  saxagliptin HCl (ONGLYZA) 5 MG TABS tablet Take by mouth daily.    Historical Provider, MD  sertraline (ZOLOFT) 50 MG tablet Take 50 mg by mouth daily.    Historical Provider, MD  vitamin E 200 UNIT capsule Take 200 Units by mouth  daily.    Historical Provider, MD    Past Medical History: Past Medical History  Diagnosis Date  . Hearing loss   . Gout   . Diabetes   . Fatigue   . Hypertension   . Stroke     X 2  . Heart failure   . Muscle pain   . Hammertoe     Past Surgical History: Past Surgical History  Procedure Laterality Date  . Stomach surgery    . Foot surgery Right     Family History: Family History  Problem Relation Age of Onset  . Dementia Mother     Social History: History   Social History  . Marital Status: Married    Spouse Name: N/A  . Number of Children: N/A  . Years of Education: N/A   Social History Main Topics  . Smoking status: Former Research scientist (life sciences)  . Smokeless tobacco: Not on file  . Alcohol Use: Yes  . Drug Use: Not on file  . Sexual Activity: Not on file   Other Topics Concern  . Not on file   Social History Narrative    Allergies:  Allergies  Allergen Reactions  . Cortisone Other (See Comments)    Cold, elevated BP, shaking all over and fever    Objective:    Vital Signs:   Temp:  [98.8 F (37.1 C)] 98.8 F (37.1 C) (02/22 1130) Pulse Rate:  [62-64] 62 (02/22 1130) Resp:  [14-20] 14 (02/22 1100) BP: (154-182)/(57-131) 182/69 mmHg (02/22 1130) SpO2:  [100 %] 100 % (02/22 1208) Weight:  [238 lb 12.1 oz (108.3 kg)] 238 lb 12.1 oz (108.3 kg) (02/22 1020) Last BM Date: 05/30/14  Weight change: Filed Weights   05/31/14 1020  Weight: 238 lb 12.1 oz (108.3 kg)    Intake/Output:   Intake/Output Summary (Last 24 hours) at 05/31/14 1225 Last data filed at 05/31/14 1145  Gross per 24 hour  Intake      3 ml  Output      0 ml  Net      3 ml     Physical Exam: General:  Elderly male lying in bed. Mild dyspnea HEENT: normal Neck: supple. JVP 7-8. Carotids 2+ bilat; no bruits. No lymphadenopathy or thryomegaly appreciated. Cor: PMI nondisplaced. Regular rate & rhythm. No rubs, gallops or murmurs. No S3.  Lungs: crackles in bases on 4 liters  [place] Abdomen: soft, nontender, nondistended. No hepatosplenomegaly. No bruits or masses. Good bowel sounds. Extremities: + cyanosis, clubbing, rash,  R and LLE 1+ edema. DPs trace bialterally Neuro: alert & orientedx3, cranial nerves grossly intact. moves all 4 extremities w/o difficulty. Affect pleasant  Telemetry: SR 60s  Labs: Basic Metabolic Panel: No results for input(s): NA, K, CL, CO2, GLUCOSE, BUN, CREATININE, CALCIUM, MG, PHOS in the last 168 hours.  Liver Function Tests: No results for input(s): AST, ALT, ALKPHOS, BILITOT, PROT, ALBUMIN in the last 168 hours. No results for input(s): LIPASE, AMYLASE in the last 168 hours. No results for input(s): AMMONIA in the last 168 hours.  CBC: No results for input(s): WBC, NEUTROABS, HGB, HCT, MCV, PLT in the last 168 hours.  Cardiac Enzymes: No results for input(s): CKTOTAL, CKMB, CKMBINDEX, TROPONINI in the last 168 hours.  BNP: BNP (last 3 results) No results for input(s): BNP in the last 8760 hours.  ProBNP (last 3 results) No results for input(s): PROBNP in the last 8760 hours.   CBG: No results for input(s): GLUCAP in the last 168 hours.  Coagulation Studies: No results for input(s): LABPROT, INR in the last 72 hours.  Other results: EKG: Imaging:  No results found.   Medications:     Current Medications: . atorvastatin  40 mg Oral q1800  . carvedilol  3.125 mg Oral BID WC  . clopidogrel  75 mg Oral Daily  . furosemide  80 mg Intravenous BID  . heparin  5,000 Units Subcutaneous 3 times per day  . insulin aspart  0-9 Units Subcutaneous TID WC  . sodium chloride  3 mL Intravenous Q12H     Infusions:      Assessment:  1. A/C Systolic  HF 2. Acute Respiratory Failure- on Bipap now weaned to nasal cannula.  3. Hypertensive crisis 4. DM 5. CKD-  Creatinine baseline 2.5 6. 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 Chest Pain 1 week ago   Plan/Discussion:   Mr Sabado is a 74 year old with a history of HF, CKD, DM, and  CVA admitted from [facility] with increased dyspnea requiring short term Bipap.   On exam he has mild volume overload so we will continue lasix 80 mg bid. Respiratory status improved with one dose of 80 mg IV lasix. Cardiac markers ok but had chest pain last week. There is concern that this may have been ischemic in nature but with elevated creatinine will hold off on cardiac cath for now. Check ECHO today. Hold bb for now. Add hydralazine 50 mg tid and imdur 30 mg daily. Continue prn hydralazine. No Ace or spiro due to CKD.   Check renal ultrasound.   Had CVA 2009-2010. On statin and plavix.   He also blood cultures at [facility].  WBC 6.9. No fever or chills prior to admit.   Length of Stay: 0 CLEGG,AMY NP-C  05/31/2014, 12:25 PM  Advanced Heart Failure Team Pager [phone] (M-F; 7a - 4p)  Please contact [facility] for night-coverage after hours (4p -7a ) and weekends on [email or web address]  Patient seen and examined with Darrick Grinder, NP. We discussed all aspects of the encounter. I agree with the assessment and plan as stated above.   He presents with acute respiratory failure due to pulmonary edema. Now improved with IV diuresis. I am very concerned he likely has an ischemic cardiomyopathy with possible acute coronary event 2 weeks ago that precipitated his HF flare. However troponin is normal and ECG is ok. Will get echo and continue IV diuresis. We discussed the possibility of cath but given CKD will likely need to start with non-invasive imaging first. Afterload reduce with hydralazine. Avoid ACE and b-blockers for now.   Benay Spice 2:33 PM

## 2014-05-31 NOTE — Progress Notes (Signed)
  Echocardiogram 2D Echocardiogram has been performed.  Donata Clay 05/31/2014, 3:11 PM

## 2014-05-31 NOTE — Progress Notes (Signed)
  Patient developed maculopapular rash on back and arms. Per nursing staff this was already partially present on arrival so likely due to abx he received at [facility] and not hydralazine that was started here.   Will switch abx. Start NTG gtt for HTN. OK to continue hydralazine.   Benay Spice 5:32 PM

## 2014-06-01 ENCOUNTER — Encounter (HOSPITAL_COMMUNITY): Payer: Self-pay | Admitting: *Deleted

## 2014-06-01 DIAGNOSIS — E1165 Type 2 diabetes mellitus with hyperglycemia: Secondary | ICD-10-CM

## 2014-06-01 DIAGNOSIS — IMO0002 Reserved for concepts with insufficient information to code with codable children: Secondary | ICD-10-CM | POA: Diagnosis present

## 2014-06-01 DIAGNOSIS — Z8673 Personal history of transient ischemic attack (TIA), and cerebral infarction without residual deficits: Secondary | ICD-10-CM | POA: Diagnosis present

## 2014-06-01 DIAGNOSIS — I429 Cardiomyopathy, unspecified: Secondary | ICD-10-CM | POA: Diagnosis present

## 2014-06-01 DIAGNOSIS — N179 Acute kidney failure, unspecified: Secondary | ICD-10-CM

## 2014-06-01 DIAGNOSIS — I10 Essential (primary) hypertension: Secondary | ICD-10-CM | POA: Diagnosis present

## 2014-06-01 DIAGNOSIS — J208 Acute bronchitis due to other specified organisms: Secondary | ICD-10-CM

## 2014-06-01 DIAGNOSIS — I5043 Acute on chronic combined systolic (congestive) and diastolic (congestive) heart failure: Secondary | ICD-10-CM | POA: Diagnosis present

## 2014-06-01 DIAGNOSIS — J9621 Acute and chronic respiratory failure with hypoxia: Secondary | ICD-10-CM

## 2014-06-01 LAB — CBC
HEMATOCRIT: 31.2 % — AB (ref 39.0–52.0)
HEMOGLOBIN: 10.5 g/dL — AB (ref 13.0–17.0)
MCH: 29.1 pg (ref 26.0–34.0)
MCHC: 33.7 g/dL (ref 30.0–36.0)
MCV: 86.4 fL (ref 78.0–100.0)
PLATELETS: 171 10*3/uL (ref 150–400)
RBC: 3.61 MIL/uL — AB (ref 4.22–5.81)
RDW: 13.3 % (ref 11.5–15.5)
WBC: 5 10*3/uL (ref 4.0–10.5)

## 2014-06-01 LAB — GLUCOSE, CAPILLARY
GLUCOSE-CAPILLARY: 138 mg/dL — AB (ref 70–99)
GLUCOSE-CAPILLARY: 133 mg/dL — AB (ref 70–99)
Glucose-Capillary: 120 mg/dL — ABNORMAL HIGH (ref 70–99)
Glucose-Capillary: 149 mg/dL — ABNORMAL HIGH (ref 70–99)

## 2014-06-01 LAB — HEMOGLOBIN A1C
Hgb A1c MFr Bld: 7.5 % — ABNORMAL HIGH (ref 4.8–5.6)
Mean Plasma Glucose: 169 mg/dL

## 2014-06-01 LAB — BASIC METABOLIC PANEL
ANION GAP: 8 (ref 5–15)
BUN: 38 mg/dL — AB (ref 6–23)
CALCIUM: 7.8 mg/dL — AB (ref 8.4–10.5)
CO2: 28 mmol/L (ref 19–32)
CREATININE: 2.95 mg/dL — AB (ref 0.50–1.35)
Chloride: 100 mmol/L (ref 96–112)
GFR calc Af Amer: 23 mL/min — ABNORMAL LOW (ref >90)
GFR calc non Af Amer: 20 mL/min — ABNORMAL LOW (ref >90)
Glucose, Bld: 154 mg/dL — ABNORMAL HIGH (ref 70–99)
Potassium: 4.7 mmol/L (ref 3.5–5.1)
Sodium: 136 mmol/L (ref 135–145)

## 2014-06-01 MED ORDER — ALLOPURINOL 100 MG PO TABS
100.0000 mg | ORAL_TABLET | Freq: Two times a day (BID) | ORAL | Status: DC
  Administered 2014-06-01 – 2014-06-05 (×9): 100 mg via ORAL
  Filled 2014-06-01 (×10): qty 1

## 2014-06-01 MED ORDER — HYDRALAZINE HCL 20 MG/ML IJ SOLN
10.0000 mg | Freq: Once | INTRAMUSCULAR | Status: AC
  Administered 2014-06-01: 10 mg via INTRAVENOUS
  Filled 2014-06-01: qty 1

## 2014-06-01 MED ORDER — OXYMETAZOLINE HCL 0.05 % NA SOLN
1.0000 | Freq: Two times a day (BID) | NASAL | Status: DC | PRN
  Filled 2014-06-01: qty 15

## 2014-06-01 MED ORDER — HYDROCORTISONE 1 % EX CREA
TOPICAL_CREAM | Freq: Four times a day (QID) | CUTANEOUS | Status: DC | PRN
  Filled 2014-06-01: qty 28

## 2014-06-01 MED ORDER — VANCOMYCIN HCL 10 G IV SOLR
1500.0000 mg | INTRAVENOUS | Status: DC
  Filled 2014-06-01: qty 1500

## 2014-06-01 MED ORDER — PSYLLIUM 95 % PO PACK
1.0000 | PACK | Freq: Every day | ORAL | Status: DC
  Administered 2014-06-01 – 2014-06-09 (×6): 1 via ORAL
  Filled 2014-06-01 (×12): qty 1

## 2014-06-01 MED ORDER — AMLODIPINE BESYLATE 10 MG PO TABS
10.0000 mg | ORAL_TABLET | Freq: Every day | ORAL | Status: DC
  Administered 2014-06-01 – 2014-06-02 (×2): 10 mg via ORAL
  Filled 2014-06-01 (×2): qty 1

## 2014-06-01 MED ORDER — PREGABALIN 50 MG PO CAPS
75.0000 mg | ORAL_CAPSULE | Freq: Two times a day (BID) | ORAL | Status: DC
  Administered 2014-06-01 – 2014-06-06 (×12): 75 mg via ORAL
  Filled 2014-06-01 (×24): qty 1

## 2014-06-01 MED ORDER — VANCOMYCIN HCL 10 G IV SOLR
1500.0000 mg | INTRAVENOUS | Status: DC
  Administered 2014-06-01: 1500 mg via INTRAVENOUS
  Filled 2014-06-01: qty 1500

## 2014-06-01 MED ORDER — DEXTROSE 5 % IV SOLN
500.0000 mg | Freq: Three times a day (TID) | INTRAVENOUS | Status: DC
  Administered 2014-06-01 – 2014-06-02 (×2): 500 mg via INTRAVENOUS
  Filled 2014-06-01 (×4): qty 0.5

## 2014-06-01 MED ORDER — SERTRALINE HCL 50 MG PO TABS
50.0000 mg | ORAL_TABLET | Freq: Every day | ORAL | Status: DC
Start: 2014-06-01 — End: 2014-06-11
  Administered 2014-06-01 – 2014-06-10 (×11): 50 mg via ORAL
  Filled 2014-06-01 (×12): qty 1

## 2014-06-01 MED ORDER — SODIUM CHLORIDE 0.9 % IV BOLUS (SEPSIS)
250.0000 mL | Freq: Once | INTRAVENOUS | Status: AC
  Administered 2014-06-01: 250 mL via INTRAVENOUS

## 2014-06-01 MED ORDER — DEXTROSE 5 % IV SOLN: 500.0000 mg | Freq: Three times a day (TID) | INTRAVENOUS | Status: DC

## 2014-06-01 MED ORDER — ACETAMINOPHEN 325 MG PO TABS
325.0000 mg | ORAL_TABLET | Freq: Once | ORAL | Status: AC
  Administered 2014-06-01: 325 mg via ORAL
  Filled 2014-06-01: qty 1

## 2014-06-01 NOTE — Significant Event (Signed)
Patient expresses increased weakness more this evening than earlier today.  Temp increased to 101.4 oral, tylenol given.  Nitro gtt increased to 76mcg with 1 dose of IV hydralizine given 10mg  for hypertension.  Attempted to page on call Cardiology @[phone]  X2  and [facility] @1850  X2  with no return phone call at this time.

## 2014-06-01 NOTE — Progress Notes (Signed)
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  Admitted with a/c HF, HTN and respiratory distress.  Yesterday he was diuresed with IV lasix + metolazone. Poor urine output. Weight up 1 pound. Started on hydralazine and IV NTG. Also had rash but this was thought to be from antibiotics. Switched to ceftriaxone and azithro,   Feels better this am. Denies dyspnea. Fever resolved.   Creatinine at [facility] 2.4 >2.9   Renal Ultrasound- No hydronephrosis. Small bilateral cysts. L>R ECHO EF 40-45% Akinesis inferior and inferolateral wall. Grade II DD.   Objective:   Weight Range:  Vital Signs:   Temp:  [97.7 F (36.5 C)-102.1 F (38.9 C)] 98.3 F (36.8 C) (02/23 0741) Pulse Rate:  [60-85] 63 (02/23 0741) Resp:  [14-28] 20 (02/23 0741) BP: (124-198)/(39-131) 141/53 mmHg (02/23 0741) SpO2:  [96 %-100 %] 100 % (02/23 0741) Weight:  [238 lb 12.1 oz (108.3 kg)-239 lb 14.4 oz (108.818 kg)] 239 lb 14.4 oz (108.818 kg) (02/23 0500) Last BM Date: 05/30/14  Weight change: Filed Weights   05/31/14 1020 06/01/14 0500  Weight: 238 lb 12.1 oz (108.3 kg) 239 lb 14.4 oz (108.818 kg)    Intake/Output:   Intake/Output Summary (Last 24 hours) at 06/01/14 0753 Last data filed at 06/01/14 0700  Gross per 24 hour  Intake 1429.53 ml  Output   1025 ml  Net 404.53 ml     Physical Exam: General: Elderly male lying in bed.No dyspnea HEENT: normal Neck: supple. JVP 7-8. Carotids 2+ bilat; no bruits. No lymphadenopathy or thryomegaly appreciated. Cor: PMI nondisplaced. Regular rate & rhythm. No rubs, gallops or murmurs. No S3.  Lungs: crackles in bases on 4 liters [place] Abdomen: soft, nontender, nondistended. No hepatosplenomegaly. No bruits or masses. Good bowel sounds. Extremities: + cyanosis, clubbing, rash, R and LLE tr edema. DPs trace bialterally Neuro: alert &  orientedx3, cranial nerves grossly intact. moves all 4 extremities w/o difficulty. Affect pleasant  Telemetry:  SR   Labs: Basic Metabolic Panel:  Recent Labs Lab 06/01/14 0315  NA 136  K 4.7  CL 100  CO2 28  GLUCOSE 154*  BUN 38*  CREATININE 2.95*  CALCIUM 7.8*    Liver Function Tests: No results for input(s): AST, ALT, ALKPHOS, BILITOT, PROT, ALBUMIN in the last 168 hours. No results for input(s): LIPASE, AMYLASE in the last 168 hours. No results for input(s): AMMONIA in the last 168 hours.  CBC:  Recent Labs Lab 06/01/14 0315  WBC 5.0  HGB 10.5*  HCT 31.2*  MCV 86.4  PLT 171    Cardiac Enzymes: No results for input(s): CKTOTAL, CKMB, CKMBINDEX, TROPONINI in the last 168 hours.  BNP: BNP (last 3 results) No results for input(s): BNP in the last 8760 hours.  ProBNP (last 3 results) No results for input(s): PROBNP in the last 8760 hours.    Other results:   Imaging: US Renal  05/31/2014   CLINICAL DATA:  Hypertension.  History of diabetes.  EXAM: RENAL/URINARY TRACT ULTRASOUND COMPLETE  COMPARISON:  None.  FINDINGS: Right Kidney:  Length: 12.4 cm. Borderline increased parenchymal echogenicity and mild diffuse cortical thinning. Normal overall renal parenchymal echogenicity. Several small cysts, largest arising from the cortex of the upper pole measuring 2 cm. No solid renal masses. No hydronephrosis.  Left Kidney:  Length: 12.7 cm. Lobulated contour with mild diffuse cortical  thinning. Normal parenchymal echogenicity. 2 cm lower pole cortical cyst. Smaller cysts lies adjacent to this measuring 13 mm. No solid renal masses. No hydronephrosis.  Bladder:  Appears normal for degree of bladder distention.  IMPRESSION: 1. No hydronephrosis. 2. Small bilateral renal cysts. Borderline increased parenchymal echogenicity from the right kidney. Bilateral, left greater than right, renal cortical thinning.   Electronically Signed   By: Lajean Manes M.D.   On: 05/31/2014  17:32   Dg Chest Port 1 View  05/31/2014   CLINICAL DATA:  Shortness of breath and congestion.  History of CHF.  EXAM: PORTABLE CHEST - 1 VIEW  COMPARISON:  None.  FINDINGS: Trachea is midline. Heart is enlarged. There is perihilar and basilar predominant mixed interstitial and airspace opacification. Probable bilateral effusions.  Degenerative changes are seen in the shoulders.  IMPRESSION: Congestive heart failure.   Electronically Signed   By: Lorin Picket M.D.   On: 05/31/2014 18:29      Medications:     Scheduled Medications: . antiseptic oral rinse  7 mL Mouth Rinse BID  . atorvastatin  40 mg Oral q1800  . azithromycin  500 mg Intravenous Q24H  . cefTRIAXone (ROCEPHIN)  IV  2 g Intravenous Q24H  . clopidogrel  75 mg Oral Daily  . furosemide  80 mg Intravenous BID  . heparin  5,000 Units Subcutaneous 3 times per day  . hydrALAZINE  50 mg Oral 3 times per day  . insulin aspart  0-9 Units Subcutaneous TID WC  . sodium chloride  3 mL Intravenous Q12H     Infusions: . nitroGLYCERIN 10 mcg/min (06/01/14 0700)     PRN Medications:  sodium chloride, acetaminophen **OR** acetaminophen, diphenhydrAMINE, hydrALAZINE, HYDROcodone-acetaminophen, morphine injection, ondansetron (ZOFRAN) IV   Assessment:   1. A/C Systolic HF 2. Acute Respiratory Failure- on Bipap now weaned to nasal cannula.  3. Hypertensive crisis 4. DM 5. Acute on CKD, stage IV - Creatinine baseline 2.5 6. 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 Chest Pain 1 week ago 8. Fever - ? Acute bronchitis   Plan/Discussion:     Length of Stay: 1 CLEGG,AMY NP-C  06/01/2014, 7:53 AM  Advanced Heart Failure Team Pager [phone] (M-F; [place])  Please contact [facility] for night-coverage after hours (4p -7a ) and weekends on [email or web address]  Patient seen and examined with Darrick Grinder, NP. We discussed all aspects of the encounter. I agree with the assessment and plan as stated above.   Somewhat improved but hasn't diuresed  much. Respiratory status is better with control of BP. Renal function slightly worse with medicorenal disease on u/s. Echo is suggestive of underlying CAD and suspect this may be severe but not cath candidate due to renal failure. Given fevers and cough suspect he may have component of acute bronchitis.  Will stop lasix today - resume po tomorrow. Resume home amlodipine. Continue hydralazine. Attempt to wean NTG gtt to off. Goal SBP 120-140. Plan Myoview in am. Treat bronchitis. Would keep in SDU today.   Daniel Bensimhon,MD 8:25 AM

## 2014-06-01 NOTE — Progress Notes (Signed)
[place] TEAM 1 - Stepdown/ICU TEAM Progress Note  Darrell Richardson YPP:[id number] DOB: 1940-08-20 DOA: 05/31/2014 PCP: [name], MD  Admit HPI / Brief Narrative: Darrell Richardson is a 74 y.o. WM PMHx  diabetes type II uncontrolled, CHF, HTN, CKD and CVA Residual Lt Hemiparesis came in to [facility] complaining about shortness of breath. They don't have beds and stepdown so they transferred him to [facility] for further medical evaluation. This gentleman gets his cardiac care through her clinic, since last night he was complaining about shortness of breath, orthopnea, increased lower extremity edema he couldn't sleep very well so he came into the hospital in a.m. Was found to be in acute respiratory failure secondary to acute CHF exacerbation, placed on BiPAP and sent to [facility] for further evaluation. In the ED troponin was negative, creatinine 2.44, hemoglobin 13.3. BNP is 7363 (reference value 0-125 pg/mL). Patient was given 1 dose of Lasix and sent for further evaluation.   HPI/Subjective: 2/23 A/O 4, distraught concerning future outcome. Recent stroke with current illness.  Assessment/Plan:  Cardiomyopathy/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 CHF -Patient presented with SOB, orthopnea, lower extremity edema and BNP of 7300. -Continue BiPAP.PRN -D/Ced Lasix per Cardiology -Strict In and Out; Since Admission +647ml - Daily Am Weight;Admission wt= 108.3 Kg             2/23 Standing wt= 108.8 Kg -Amlodipine 10 mg daily -Continue hydralazine 50 mg TID -Losartan held because of worsening creatinine.  Acute on chronic respiratory failure with hypoxia/bronchitis? - Positive fever on Admission, negative leukocytosis, bands or left shift  -PE? CT scan pending  -Titrate O2/BiPAP to maintain SPO2 89-93%   Hypertension -Uncontrolled, cardiology adjusting medications.  Diabetes mellitus type 2 uncontrolled -2/22 hemoglobin A1c = 7.5 . -Continue  sensitive SSI .   CKD stage III (2.5 and 2.8),  -Currently Cr= 2.95 which is slightly high. Diuretic stopped by cardiology .  History of CVA -Residual left hemiparesis. Per wife did not attend inpatient rehabilitation  -PT/OT evaluate for CIR  vs SNF This is stable, patient is on Plavix, continued.    Code Status: FULL Family Communication:  family present at time of exam Disposition Plan: Per cardiology    Consultants: Dr.Daniel R Bensimhon (cardiology heart failure team)    Procedure/Significant Events: 2/22 echocardiogram;- Left ventricle: mild LVH. -LVEF= 40% to 45%. akinesis of inferolateral and inferior myocardium.  -(grade 2 diastolic dysfunction).  - Left atrium: severely dilated. - Pericardium, extracardiac: A small pericardial effusion was identified.  2/22 PCXR;Congestive heart failure.   Culture 2/22 MRSA by PCR negative 2/23 sputum pending 2/23 respiratory virus panel pending   Antibiotics: Azithromycin 2/23>> Vancomycin 2/24>>   DVT prophylaxis: Subcutaneous heparin   Devices NA   LINES / TUBES:  NA    Continuous Infusions: . nitroGLYCERIN 35 mcg/min (06/01/14 1433)    Objective: VITAL SIGNS: Temp: 98.1 F (36.7 C) (02/23 1200) Temp Source: Oral (02/23 1200) BP: 171/59 mmHg (02/23 1400) Pulse Rate: 76 (02/23 1400) SPO2; FIO2:   Intake/Output Summary (Last 24 hours) at 06/01/14 1540 Last data filed at 06/01/14 1433  Gross per 24 hour  Intake 1915.05 ml  Output   1350 ml  Net 565.05 ml     Exam: General: A/O x 4 sitting in chair, No acute respiratory distress Lungs: Clear to auscultation bilaterally without wheezes or crackles Cardiovascular: Regular rate and rhythm without murmur gallop or rub normal S1 and S2 Abdomen: Nontender, nondistended, soft,  bowel sounds positive, no rebound, no ascites, no appreciable mass Extremities: No significant cyanosis, clubbing, or edema bilateral lower extremities Neurologic; cranial  nerves II through XII intact, tongue/uvula midline, pupils equal round reactive to light and accommodation, right side extremity strength 5/5, Lt Hemiparesis, did not ambulate patient  Data Reviewed: Basic Metabolic Panel:  Recent Labs Lab 06/01/14 0315  NA 136  K 4.7  CL 100  CO2 28  GLUCOSE 154*  BUN 38*  CREATININE 2.95*  CALCIUM 7.8*   Liver Function Tests: No results for input(s): AST, ALT, ALKPHOS, BILITOT, PROT, ALBUMIN in the last 168 hours. No results for input(s): LIPASE, AMYLASE in the last 168 hours. No results for input(s): AMMONIA in the last 168 hours. CBC:  Recent Labs Lab 06/01/14 0315  WBC 5.0  HGB 10.5*  HCT 31.2*  MCV 86.4  PLT 171   Cardiac Enzymes: No results for input(s): CKTOTAL, CKMB, CKMBINDEX, TROPONINI in the last 168 hours. BNP (last 3 results) No results for input(s): BNP in the last 8760 hours.  ProBNP (last 3 results) No results for input(s): PROBNP in the last 8760 hours.  CBG:  Recent Labs Lab 05/31/14 1133 05/31/14 1627 05/31/14 2057 06/01/14 0740 06/01/14 1121  GLUCAP 134* 125* 132* 138* 120*    Recent Results (from the past 240 hour(s))  MRSA PCR Screening     Status: None   Collection Time: 05/31/14 10:55 AM  Result Value Ref Range Status   MRSA by PCR NEGATIVE NEGATIVE Final    Comment:        The GeneXpert MRSA Assay (FDA approved for NASAL specimens only), is one component of a comprehensive MRSA colonization surveillance program. It is not intended to diagnose MRSA infection nor to guide or monitor treatment for MRSA infections.      Studies:  Recent x-ray studies have been reviewed in detail by the Attending Physician  Scheduled Meds:  Scheduled Meds: . allopurinol  100 mg Oral BID  . amLODipine  10 mg Oral Daily  . antiseptic oral rinse  7 mL Mouth Rinse BID  . atorvastatin  40 mg Oral q1800  . [START ON 06/02/2014] aztreonam  500 mg Intravenous 3 times per day  . clopidogrel  75 mg Oral Daily   . heparin  5,000 Units Subcutaneous 3 times per day  . hydrALAZINE  50 mg Oral 3 times per day  . insulin aspart  0-9 Units Subcutaneous TID WC  . pregabalin  75 mg Oral BID  . psyllium  1 packet Oral QHS  . sertraline  50 mg Oral QHS  . sodium chloride  3 mL Intravenous Q12H  . [START ON 06/02/2014] vancomycin  1,500 mg Intravenous Q48H    Time spent on care of this patient: 40 mins   [facility]  [facility] Office  [phone] Pager - [phone]  On-Call/Text Page:      [email or web address]      password [email or web address]  If 7PM-7AM, please contact night-coverage [email or web address] Password [email or web address] 06/01/2014, 3:40 PM   LOS: 1 day   Care during the described time interval was provided by me .  I have reviewed this patient's available data, including medical history, events of note, physical examination, radiology studies and test results as part of my evaluation  Dia Crawford, MD [phone] Pager

## 2014-06-01 NOTE — Progress Notes (Signed)
CARDIAC REHAB PHASE I   PRE:  Rate/Rhythm: 84 SR  BP:  Supine:   Sitting: 144/54  Standing:    SaO2: 100 4L 100 2L  MODE:  Ambulation: few steps in room ft   POST:  Rate/Rhythm: 98 SR  BP:  Supine:   Sitting: 153/52  Standing:    SaO2: 94 RA 1440-1520 On arrival pt in bed sleeping, aroused easily. He had a difficult time getting to sitting position and hold him own weight sitting on side of bed he would lean to the right. Assisted X 2 used walker and gait belt to ambulate. Pt only took a few steps in room. He  became tearful and c/o of feeling weak. Sat pt in recliner.He states I think I had another stroke.Pt seemed to had generalized weakness with no specific deficit. Pt's wife and son in room, they say that pt is able to walk short distances at home in the house and outside in the yard. Believe that his weakness may be from the benadryl. VS stable Pt in recliner with family present and call light in reach. RN aware of his attempt to ambulate.RA sat after walk 94%, left O2 off. Rodney Langton RN 06/01/2014 3:19 PM

## 2014-06-01 NOTE — Plan of Care (Signed)
Problem: Phase I Progression Outcomes Goal: EF % per last Echo/documented,Core Reminder form on chart Outcome: Progressing EF 40-45% Goal: Up in chair, BRP Outcome: Not Progressing Patient refuses to ambulate and get out of bed, will continue to encourage  Goal: Voiding-avoid urinary catheter unless indicated Outcome: Progressing Condom Cath d/c.  Patient able to voice when he has to urinate Goal: Hemodynamically stable Outcome: Not Progressing Patient requiring IV Nitro for BP control as well as PO meds.  Hydralzine given x1 PRN

## 2014-06-01 NOTE — Care Management Note (Addendum)
    Page 1 of 1   06/07/2014     9:30:52 AM CARE MANAGEMENT NOTE 06/07/2014  Patient:  Darrell Richardson, Darrell Richardson   Account Number:  [id number]  Date Initiated:  05/31/2014  Documentation initiated by:  Elissa Hefty  Subjective/Objective Assessment:   adm w heart failure, shortness of breath     Action/Plan:   lives w fam, pcp dr mark crissmon   Anticipated DC Date:     Anticipated DC Plan:  [place]   Choice offered to / List presented to:  C-3 Spouse        HH arranged  HH-1 RN  [place].   Status of service:   Medicare Important Message given?  YES (If response is "NO", the following Medicare IM given date fields will be blank) Date Medicare IM given:  06/07/2014 Medicare IM given by:  Elissa Hefty Date Additional Medicare IM given:   Additional Medicare IM given by:    Discharge Disposition:  [place]  Per UR Regulation:  Reviewed for med. necessity/level of care/duration of stay  If discussed at [place] of Stay Meetings, dates discussed:   06/08/2014    Comments:  2/23 1511 debbie Jessicia Napolitano rn,bsn spoke w pt and wife reg hhc. they had dealt w hhc agency that was more of hospice and only take chf if 76mos or less. wife decided to use different agency and chose [facility] hhrn. ref to donna for hhrn for chf and dis mangtmt.

## 2014-06-01 NOTE — Progress Notes (Signed)
ANTIBIOTIC CONSULT NOTE - INITIAL  Pharmacy Consult for vancomycin, aztreonam Indication: bronchitis  Allergies  Allergen Reactions  . Cortisone Anaphylaxis and Other (See Comments)    Cold, elevated BP, shaking all over and fever  . Levofloxacin Rash    May not be allergic to levaquin - had rash after receiving ceftriaxone and levaquin together, and then recurrence of rash after next dose of ceftriaxone (rash likely due to ceftriaxone)  . Ambien [Zolpidem Tartrate]     Did not tolerate well or remembered anything from previous night  . Rocephin [Ceftriaxone] Hives    Patient Measurements: Height: 5\' 8"  (172.7 cm) Weight: 239 lb 14.4 oz (108.818 kg) IBW/kg (Calculated) : 68.4 Adjusted Body Weight: n/a  Vital Signs: Temp: 98.3 F (36.8 C) (02/23 1119) Temp Source: Oral (02/23 1119) BP: 151/48 mmHg (02/23 1119) Pulse Rate: 67 (02/23 1119) Intake/Output from previous day: 02/22 0701 - 02/23 0700 In: 1429.5 [P.O.:1140; I.V.:289.5] Out: 1025 [Urine:1025] Intake/Output from this shift: Total I/O In: 579 [P.O.:240; I.V.:39; IV Piggyback:300] Out: 200 [Urine:200]  Labs:  Recent Labs  06/01/14 0315  WBC 5.0  HGB 10.5*  PLT 171  CREATININE 2.95*   Estimated Creatinine Clearance: 26.7 mL/min (by C-G formula based on Cr of 2.95). No results for input(s): VANCOTROUGH, VANCOPEAK, VANCORANDOM, GENTTROUGH, GENTPEAK, GENTRANDOM, TOBRATROUGH, TOBRAPEAK, TOBRARND, AMIKACINPEAK, AMIKACINTROU, AMIKACIN in the last 72 hours.   Microbiology: Recent Results (from the past 720 hour(s))  MRSA PCR Screening     Status: None   Collection Time: 05/31/14 10:55 AM  Result Value Ref Range Status   MRSA by PCR NEGATIVE NEGATIVE Final    Comment:        The GeneXpert MRSA Assay (FDA approved for NASAL specimens only), is one component of a comprehensive MRSA colonization surveillance program. It is not intended to diagnose MRSA infection nor to guide or monitor treatment for MRSA  infections.     Medical History: Past Medical History  Diagnosis Date  . Hearing loss   . Gout   . Diabetes   . Fatigue   . Hypertension   . Stroke     X 2  . Heart failure   . Muscle pain   . Hammertoe     Assessment: 74 yo male admitted with CHF, suspected PNA, but bronchitis appears more likely.  Was given Levaquin and ceftriaxone at [facility] and developed rash after.  Suspected Levaquin allergy, but had occurrence of hives today when rechallenged with ceftriaxone.  Pharmacy asked to change empiric coverage to vancomycin and aztreonam for now.  Febrile overnight, but WBC WNL.  No cultures.  Received all of ceftriaxone dose except for 10 ml prior to stopping for hives.  Azithromycin currently infusing.  Scr 2.95 - Est CrCl ~ 26 ml/min  Goal of Therapy:  Vancomycin trough level 15-20 mcg/ml  Plan:  1. Vancomycin 1500 mg IV q 48 hrs. 2. Aztreonam 500 mg IV q 8 hrs. 3. Will start BOTH new antibiotics tomorrow since he did receive doses of ceftriaxone and azithromycin today.  Should be therapeutic through tomorrow.  Uvaldo Rising, BCPS  Clinical Pharmacist Pager [phone]  06/01/2014 12:11 PM

## 2014-06-01 NOTE — Significant Event (Signed)
Rocephin started at 1035 through new PIV site.  Patient received 41ml of 44ml when started getting large hives on back, flank, and left arm.  Infusion stopped, NS started, benadryl given, MD notified.  Rocephin added to allergy.  Patient and family instructed to call immediately if having increased difficulty of breathing.  Will continue to monitor closely

## 2014-06-02 ENCOUNTER — Inpatient Hospital Stay (HOSPITAL_COMMUNITY): Payer: Medicare Other

## 2014-06-02 DIAGNOSIS — I509 Heart failure, unspecified: Secondary | ICD-10-CM

## 2014-06-02 DIAGNOSIS — I5023 Acute on chronic systolic (congestive) heart failure: Secondary | ICD-10-CM

## 2014-06-02 LAB — URINALYSIS, ROUTINE W REFLEX MICROSCOPIC
Bilirubin Urine: NEGATIVE
Glucose, UA: 250 mg/dL — AB
Hgb urine dipstick: NEGATIVE
Ketones, ur: NEGATIVE mg/dL
Leukocytes, UA: NEGATIVE
Nitrite: NEGATIVE
Protein, ur: >300 mg/dL — AB
Specific Gravity, Urine: 1.017 (ref 1.005–1.030)
Urobilinogen, UA: 0.2 mg/dL (ref 0.0–1.0)
pH: 6 (ref 5.0–8.0)

## 2014-06-02 LAB — BASIC METABOLIC PANEL
ANION GAP: 6 (ref 5–15)
BUN: 46 mg/dL — ABNORMAL HIGH (ref 6–23)
CALCIUM: 7.3 mg/dL — AB (ref 8.4–10.5)
CO2: 26 mmol/L (ref 19–32)
Chloride: 100 mmol/L (ref 96–112)
Creatinine, Ser: 3.49 mg/dL — ABNORMAL HIGH (ref 0.50–1.35)
GFR calc Af Amer: 19 mL/min — ABNORMAL LOW (ref >90)
GFR calc non Af Amer: 16 mL/min — ABNORMAL LOW (ref >90)
Glucose, Bld: 190 mg/dL — ABNORMAL HIGH (ref 70–99)
Potassium: 4.4 mmol/L (ref 3.5–5.1)
Sodium: 132 mmol/L — ABNORMAL LOW (ref 135–145)

## 2014-06-02 LAB — CBC
HCT: 32.3 % — ABNORMAL LOW (ref 39.0–52.0)
HEMOGLOBIN: 10.9 g/dL — AB (ref 13.0–17.0)
MCH: 29.4 pg (ref 26.0–34.0)
MCHC: 33.7 g/dL (ref 30.0–36.0)
MCV: 87.1 fL (ref 78.0–100.0)
Platelets: 170 10*3/uL (ref 150–400)
RBC: 3.71 MIL/uL — AB (ref 4.22–5.81)
RDW: 13.4 % (ref 11.5–15.5)
WBC: 6.7 10*3/uL (ref 4.0–10.5)

## 2014-06-02 LAB — URINE MICROSCOPIC-ADD ON

## 2014-06-02 LAB — GLUCOSE, CAPILLARY
GLUCOSE-CAPILLARY: 200 mg/dL — AB (ref 70–99)
Glucose-Capillary: 168 mg/dL — ABNORMAL HIGH (ref 70–99)
Glucose-Capillary: 167 mg/dL — ABNORMAL HIGH (ref 70–99)

## 2014-06-02 MED ORDER — REGADENOSON 0.4 MG/5ML IV SOLN
INTRAVENOUS | Status: AC
  Administered 2014-06-02: 0.4 mg via INTRAVENOUS
  Filled 2014-06-02: qty 5

## 2014-06-02 MED ORDER — HYDRALAZINE HCL 50 MG PO TABS
75.0000 mg | ORAL_TABLET | Freq: Three times a day (TID) | ORAL | Status: DC
  Administered 2014-06-02 – 2014-06-03 (×4): 75 mg via ORAL
  Filled 2014-06-02 (×6): qty 1

## 2014-06-02 MED ORDER — TECHNETIUM TC 99M SESTAMIBI GENERIC - CARDIOLITE
10.0000 | Freq: Once | INTRAVENOUS | Status: AC | PRN
  Administered 2014-06-02: 10 via INTRAVENOUS

## 2014-06-02 MED ORDER — OSELTAMIVIR PHOSPHATE 30 MG PO CAPS
30.0000 mg | ORAL_CAPSULE | Freq: Every day | ORAL | Status: AC
  Administered 2014-06-02 – 2014-06-06 (×5): 30 mg via ORAL
  Filled 2014-06-02 (×5): qty 1

## 2014-06-02 MED ORDER — ONDANSETRON HCL 4 MG/2ML IJ SOLN
INTRAMUSCULAR | Status: AC
  Administered 2014-06-02: 4 mg
  Filled 2014-06-02: qty 2

## 2014-06-02 MED ORDER — TECHNETIUM TC 99M SESTAMIBI GENERIC - CARDIOLITE
30.0000 | Freq: Once | INTRAVENOUS | Status: AC | PRN
  Administered 2014-06-02: 30 via INTRAVENOUS

## 2014-06-02 MED ORDER — SODIUM CHLORIDE 0.9 % IV SOLN
INTRAVENOUS | Status: DC | PRN
  Administered 2014-06-02: 22:00:00 via INTRAVENOUS

## 2014-06-02 MED ORDER — REGADENOSON 0.4 MG/5ML IV SOLN
0.4000 mg | Freq: Once | INTRAVENOUS | Status: AC
  Administered 2014-06-02: 0.4 mg via INTRAVENOUS
  Filled 2014-06-02: qty 5

## 2014-06-02 MED ORDER — OSELTAMIVIR PHOSPHATE 75 MG PO CAPS: 75.0000 mg | ORAL_CAPSULE | Freq: Two times a day (BID) | ORAL | Status: DC

## 2014-06-02 MED ORDER — SODIUM CHLORIDE 0.9 % IV BOLUS (SEPSIS)
500.0000 mL | Freq: Once | INTRAVENOUS | Status: AC
  Administered 2014-06-02: 500 mL via INTRAVENOUS

## 2014-06-02 MED ORDER — GI COCKTAIL ~~LOC~~
30.0000 mL | Freq: Once | ORAL | Status: AC
  Administered 2014-06-02: 30 mL via ORAL
  Filled 2014-06-02: qty 30

## 2014-06-02 NOTE — Progress Notes (Signed)
PT Cancellation Note  Patient Details Name: FLEETWOOD PIERRON MRN: [id number] DOB: 1940/12/19   Cancelled Treatment:    Reason Eval/Treat Not Completed: Medical issues which prohibited therapy, Pt fatigued with fever per RN. Will continue to follow.   Duncan Dull 06/02/2014, 2:41 PM Alben Deeds, [place] DPT  [phone]'

## 2014-06-02 NOTE — Progress Notes (Signed)
Noted events and spoke to RN. Will hold ambulation today. Yves Dill CES, ACSM 2:17 PM 06/02/2014

## 2014-06-02 NOTE — Progress Notes (Signed)
Md aware of pt's temp up to 101.9A.  Gave pt bath and placed ice under arms.  Temp down to 100.9 oral.  SBP = 176 increase NTG gtt.  Last SBP = 149.  Wife at bedside. Will continue to monitor. Saunders Revel T

## 2014-06-02 NOTE — Progress Notes (Signed)
PT Cancellation Note  Patient Details Name: Darrell Richardson MRN: [id number] DOB: 05-13-1940   Cancelled Treatment:    Reason Eval/Treat Not Completed: Patient at procedure or test/unavailable (at nuclear med)   Duncan Dull 06/02/2014, 9:31 AM Alben Deeds, PT DPT  [phone]

## 2014-06-02 NOTE — Progress Notes (Signed)
   Lexiscan Cardiolite completed.  Pt developed nausea with copious amounts of dark brown emesis during testing.  No obvious blood/clots.  All symptoms resolved within 5 minutes of lexiscan injection.  VSS.  No acute ECG changes.  Perfusion imaging pending.  [name], NP 06/02/2014, 10:08 AM

## 2014-06-02 NOTE — Progress Notes (Signed)
Per Md order for I&o cath for urine culture.   pt requested to have condom cath placed first and if unable then ok for I&o cath.  Pt requesting something for indigestion.   SBP 153 PRN given.  Will continue to monitor. Saunders Revel T

## 2014-06-02 NOTE — Progress Notes (Signed)
Called Darrell Richardson's nurse to inform her of emesis/stool/nausea/zofran IV given - she was busy, but the secretary took the message and will pass on this information.

## 2014-06-02 NOTE — Progress Notes (Signed)
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  Admitted with a/c HF, HTN and respiratory distress.  Febrile last night to 101. Had urinary retention with 600cc out on I/O. UA negative.  Abx switched. (had rash with rocephin). Flu swab sent.   Feels weak. Fatigued. Getting stress test. Remains on IV NTG for hypertension  Creatinine at [facility] 2.4 >2.9 > 3.5  Renal Ultrasound- No hydronephrosis. Small bilateral cysts. L>R ECHO EF 40-45% Akinesis inferior and inferolateral wall. Grade II DD.  Lexiscan Myvoiew 06/02/14: There is a large and severe inferior and inferoseptal perfusion defect with minimal reversibility around the defect edges. This is consistent with a large infarction in the right coronary artery distribution with mild peri-infarct ischemia.  Objective:   Weight Range:  Vital Signs:   Temp:  [98.1 F (36.7 C)-102 F (38.9 C)] 99.4 F (37.4 C) (02/24 0740) Pulse Rate:  [67-96] 89 (02/24 0949) Resp:  [14-27] 24 (02/24 0949) BP: (104-192)/(38-81) 163/45 mmHg (02/24 0918) SpO2:  [90 %-100 %] 97 % (02/24 0949) Weight:  [110.3 kg (243 lb 2.7 oz)] 110.3 kg (243 lb 2.7 oz) (02/24 0335) Last BM Date: 06/01/14  Weight change: Filed Weights   05/31/14 1020 06/01/14 0500 06/02/14 0335  Weight: 108.3 kg (238 lb 12.1 oz) 108.818 kg (239 lb 14.4 oz) 110.3 kg (243 lb 2.7 oz)    Intake/Output:   Intake/Output Summary (Last 24 hours) at 06/02/14 0950 Last data filed at 06/02/14 0900  Gross per 24 hour  Intake 2389.02 ml  Output   1801 ml  Net 588.02 ml     Physical Exam: General: Elderly male lying in bed.No dyspnea HEENT: normal Neck: supple. JVP 7-8. Carotids 2+ bilat; no bruits. No lymphadenopathy or thryomegaly appreciated. Cor: PMI nondisplaced. Regular rate & rhythm. No rubs, gallops or murmurs. No S3.  Lungs: crackles in bases on 4  liters [place] Abdomen: soft, nontender, nondistended. No hepatosplenomegaly. No bruits or masses. Good bowel sounds. Extremities: + cyanosis, clubbing, rash, R and LLE tr edema. DPs trace bialterally Neuro: alert & orientedx3, cranial nerves grossly intact. moves all 4 extremities w/o difficulty. Affect pleasant  Telemetry:  SR   Labs: Basic Metabolic Panel:  Recent Labs Lab 06/01/14 0315 06/02/14 0310  NA 136 132*  K 4.7 4.4  CL 100 100  CO2 28 26  GLUCOSE 154* 190*  BUN 38* 46*  CREATININE 2.95* 3.49*  CALCIUM 7.8* 7.3*    Liver Function Tests: No results for input(s): AST, ALT, ALKPHOS, BILITOT, PROT, ALBUMIN in the last 168 hours. No results for input(s): LIPASE, AMYLASE in the last 168 hours. No results for input(s): AMMONIA in the last 168 hours.  CBC:  Recent Labs Lab 06/01/14 0315 06/02/14 0310  WBC 5.0 6.7  HGB 10.5* 10.9*  HCT 31.2* 32.3*  MCV 86.4 87.1  PLT 171 170    Cardiac Enzymes: No results for input(s): CKTOTAL, CKMB, CKMBINDEX, TROPONINI in the last 168 hours.  BNP: BNP (last 3 results) No results for input(s): BNP in the last 8760 hours.  ProBNP (last 3 results) No results for input(s): PROBNP in the last 8760 hours.    Other results:   Imaging: US Renal  05/31/2014   CLINICAL DATA:  Hypertension.  History of diabetes.  EXAM: RENAL/URINARY TRACT ULTRASOUND COMPLETE  COMPARISON:  None.  FINDINGS: Right Kidney:  Length: 12.4  cm. Borderline increased parenchymal echogenicity and mild diffuse cortical thinning. Normal overall renal parenchymal echogenicity. Several small cysts, largest arising from the cortex of the upper pole measuring 2 cm. No solid renal masses. No hydronephrosis.  Left Kidney:  Length: 12.7 cm. Lobulated contour with mild diffuse cortical thinning. Normal parenchymal echogenicity. 2 cm lower pole cortical cyst. Smaller cysts lies adjacent to this measuring 13 mm. No solid renal masses. No hydronephrosis.  Bladder:  Appears  normal for degree of bladder distention.  IMPRESSION: 1. No hydronephrosis. 2. Small bilateral renal cysts. Borderline increased parenchymal echogenicity from the right kidney. Bilateral, left greater than right, renal cortical thinning.   Electronically Signed   By: Lajean Manes M.D.   On: 05/31/2014 17:32   Dg Chest Port 1 View  05/31/2014   CLINICAL DATA:  Shortness of breath and congestion.  History of CHF.  EXAM: PORTABLE CHEST - 1 VIEW  COMPARISON:  None.  FINDINGS: Trachea is midline. Heart is enlarged. There is perihilar and basilar predominant mixed interstitial and airspace opacification. Probable bilateral effusions.  Degenerative changes are seen in the shoulders.  IMPRESSION: Congestive heart failure.   Electronically Signed   By: Lorin Picket M.D.   On: 05/31/2014 18:29     Medications:     Scheduled Medications: . allopurinol  100 mg Oral BID  . amLODipine  10 mg Oral Daily  . antiseptic oral rinse  7 mL Mouth Rinse BID  . atorvastatin  40 mg Oral q1800  . aztreonam  500 mg Intravenous 3 times per day  . clopidogrel  75 mg Oral Daily  . heparin  5,000 Units Subcutaneous 3 times per day  . hydrALAZINE  50 mg Oral 3 times per day  . insulin aspart  0-9 Units Subcutaneous TID WC  . pregabalin  75 mg Oral BID  . psyllium  1 packet Oral QHS  . sertraline  50 mg Oral QHS  . sodium chloride  3 mL Intravenous Q12H  . vancomycin  1,500 mg Intravenous Q48H    Infusions: . nitroGLYCERIN 65 mcg/min (06/02/14 0700)    PRN Medications: sodium chloride, acetaminophen **OR** acetaminophen, diphenhydrAMINE, hydrALAZINE, HYDROcodone-acetaminophen, hydrocortisone cream, morphine injection, ondansetron (ZOFRAN) IV, oxymetazoline, technetium sestamibi generic   Assessment:   1. A/C Systolic HF 2. Acute Respiratory Failure- on Bipap now weaned to nasal cannula.  3. Hypertensive crisis 4. DM 5. Acute on CKD, stage IV - Creatinine baseline 2.5 6. 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 Chest  Pain 1 week ago 8. Fever - ? Acute bronchitis vs flu   Plan/Discussion:    My suspicion is that most of his presentation is related to infectious process. I worry he may have the flu. Swab sent. Continue broad spectrum abx. Await cx from [facility].  Renal function worse. Will bolus with NS 500 x 1 with plan to repeat as needed. Hold all diuretics.  Will increase hyrdralazine.    Echo is suggestive of underlying CAD and suspect this may be severe but not cath candidate due to renal failure. Lexiscan confirms but no significant ischemia. Med management.   Luretta Everly,MD 9:50 AM

## 2014-06-02 NOTE — Progress Notes (Signed)
I&O cath pt due to unable to void.  steril tech. With 2nd RN.  Bladder scan 500 cc. Urine output = 600 sent urine for UA.  Pt tolerated well. Wife at bedside. Will continue to monitor. Saunders Revel T

## 2014-06-02 NOTE — Progress Notes (Signed)
Notified Md about pt's temp of 102.  New orders received.  Educated pt's family about not piling blankets on pt when he has temp. Awaiting lab draw for [facility] then will hang ATBX.  Will continue to monitor. Saunders Revel T

## 2014-06-02 NOTE — Progress Notes (Signed)
OT Cancellation Note  Patient Details Name: Darrell Richardson MRN: [id number] DOB: [birth year]   Cancelled Treatment:    Reason Eval/Treat Not Completed: Patient at procedure or test/ unavailable. Will continue to follow.  Malka So 06/02/2014, 9:27 AM

## 2014-06-02 NOTE — Progress Notes (Signed)
[place] TEAM 1 - Stepdown/ICU TEAM Progress Note  Darrell Richardson PYK:[id number] DOB: 1941-03-27 DOA: 05/31/2014 PCP: [name], MD  Admit HPI / Brief Narrative: 74 y.o. M Hx diabetes type II uncontrolled, CHF, HTN, CKD, and CVA w/ residual Lt Hemiparesis who presented to [facility] complaining of shortness of breath. He was found to be in acute respiratory failure secondary to an acute CHF exacerbation, placed on BiPAP and sent to [facility] for further evaluation.  In the ED troponin was negative, creatinine 2.44, hemoglobin 13.3. BNP was 7363. Patient was given 1 dose of Lasix and sent for further evaluation.  HPI/Subjective: Pt has has recurring fevers, nausea, vomiting, and diarrhea.  He denies current cp, or sob.  He is quiet lethargic.    Assessment/Plan:  Fever / dyspnea / N+V /diarrhea / Acute on chronic respiratory failure with hypoxia  -this sounds very suspicious for a viral illness to me, with influenze being high on the differential  -his signs/sx are not convincing for a bacterial infection - I will stop his abx -cont droplet isolation and begin trial of Tamiflu -viral resp panel already obtained and pending   Cardiomyopathy / Chronic diastolic and systolic CHF -care as per CHF Team - CHF does not appear to be the cause of his resp sx - clinically he appears dry at the moment, and diuresis has not improved sx and only lead to renal insuff   Hypertension -BP currently low - rehydrating  - follow  Diabetes mellitus type 2 uncontrolled -A1c = 7.5 - CBG reasonably controlled - follow w/o change in tx today   Urinary retention  -foley place - empiric tx for presumed BPH - voiding trial once renal fxn stable    CKD stage III  -renal fxn has worsened w/ diuresis - discussed w/ CHF Team - holding diuretic and gently hydrating today   History of CVA -Residual left hemiparesis - per wife did not attend inpatient rehabilitation  -PT/OT  evaluate for CIR  vs SNF once clinically stable   Code Status: FULL Family Communication:  Spoke w/ wife and son at bedside  Disposition Plan: SDU  Consultants: Heart Failure Team   Procedure/Significant Events: 2/22 echocardiogram- Left ventricle: mild LVH. -LVEF= 40% to 45%. akinesis of inferolateral and inferior myocardium -grade 2 diastolic dysfunction - Left atrium: severely dilated - Pericardium, extracardiac: A small pericardial effusion was identified.  Antibiotics: Azithromycin 2/23 > 2/24 Vancomycin 2/23 > 2/24  DVT prophylaxis: Subcutaneous heparin  Objective: Blood pressure 94/40, pulse 94, temperature 100.9 F (38.3 C), temperature source Oral, resp. rate 25, height 5\' 8"  (1.727 m), weight 110.3 kg (243 lb 2.7 oz), SpO2 93 %.  Intake/Output Summary (Last 24 hours) at 06/02/14 1411 Last data filed at 06/02/14 1130  Gross per 24 hour  Intake 1745.7 ml  Output   1201 ml  Net  544.7 ml   Exam: General: lethargic in bed - no acute resp distress at rest presently  Lungs: Clear to auscultation bilaterally without wheezes or crackles Cardiovascular: Regular rate and rhythm without murmur gallop or rub  Abdomen: Nontender, nondistended, soft, bowel sounds positive, no rebound, no ascites, no appreciable mass Extremities: No significant cyanosis, clubbing, or edema bilateral lower extremities  Data Reviewed: Basic Metabolic Panel:  Recent Labs Lab 06/01/14 0315 06/02/14 0310  NA 136 132*  K 4.7 4.4  CL 100 100  CO2 28 26  GLUCOSE 154* 190*  BUN 38* 46*  CREATININE 2.95* 3.49*  CALCIUM  7.8* 7.3*   Liver Function Tests: No results for input(s): AST, ALT, ALKPHOS, BILITOT, PROT, ALBUMIN in the last 168 hours. No results for input(s): LIPASE, AMYLASE in the last 168 hours. No results for input(s): AMMONIA in the last 168 hours.   CBC:  Recent Labs Lab 06/01/14 0315 06/02/14 0310  WBC 5.0 6.7  HGB 10.5* 10.9*  HCT 31.2* 32.3*  MCV 86.4 87.1  PLT 171  170   CBG:  Recent Labs Lab 06/01/14 1121 06/01/14 1657 06/01/14 2135 06/02/14 0739 06/02/14 1122  GLUCAP 120* 133* 149* 200* 168*    Recent Results (from the past 240 hour(s))  MRSA PCR Screening     Status: None   Collection Time: 05/31/14 10:55 AM  Result Value Ref Range Status   MRSA by PCR NEGATIVE NEGATIVE Final    Comment:        The GeneXpert MRSA Assay (FDA approved for NASAL specimens only), is one component of a comprehensive MRSA colonization surveillance program. It is not intended to diagnose MRSA infection nor to guide or monitor treatment for MRSA infections.     Studies:  Recent x-ray studies have been reviewed in detail by the Attending Physician  Scheduled Meds:  Scheduled Meds: . allopurinol  100 mg Oral BID  . amLODipine  10 mg Oral Daily  . antiseptic oral rinse  7 mL Mouth Rinse BID  . atorvastatin  40 mg Oral q1800  . aztreonam  500 mg Intravenous 3 times per day  . clopidogrel  75 mg Oral Daily  . heparin  5,000 Units Subcutaneous 3 times per day  . hydrALAZINE  75 mg Oral 3 times per day  . insulin aspart  0-9 Units Subcutaneous TID WC  . pregabalin  75 mg Oral BID  . psyllium  1 packet Oral QHS  . sertraline  50 mg Oral QHS  . sodium chloride  3 mL Intravenous Q12H  . vancomycin  1,500 mg Intravenous Q48H    Time spent on care of this patient: 35 mins  Cherene Altes, MD [facility] For Consults/Admissions - Flow Manager - [phone] Office  [phone]  Contact MD directly via text page:      [email or web address]      password [facility]  06/02/2014, 2:11 PM   LOS: 2 days

## 2014-06-02 NOTE — Progress Notes (Signed)
OT Cancellation Note  Patient Details Name: SHANDY VI MRN: [id number] DOB: May 13, 1940   Cancelled Treatment:    Reason Eval/Treat Not Completed: Medical issues which prohibited therapy. Pt fatigued with fever per RN.  Will continue to follow.  Malka So 06/02/2014, 2:37 PM

## 2014-06-03 ENCOUNTER — Inpatient Hospital Stay (HOSPITAL_COMMUNITY): Payer: Medicare Other

## 2014-06-03 DIAGNOSIS — R509 Fever, unspecified: Secondary | ICD-10-CM

## 2014-06-03 DIAGNOSIS — I420 Dilated cardiomyopathy: Secondary | ICD-10-CM

## 2014-06-03 LAB — URINALYSIS, ROUTINE W REFLEX MICROSCOPIC
Glucose, UA: 250 mg/dL — AB
KETONES UR: NEGATIVE mg/dL
Nitrite: NEGATIVE
PH: 5 (ref 5.0–8.0)
Specific Gravity, Urine: 1.021 (ref 1.005–1.030)
Urobilinogen, UA: 0.2 mg/dL (ref 0.0–1.0)

## 2014-06-03 LAB — DIFFERENTIAL
BASOS ABS: 0 10*3/uL (ref 0.0–0.1)
Basophils Relative: 0 % (ref 0–1)
EOS ABS: 0 10*3/uL (ref 0.0–0.7)
EOS PCT: 1 % (ref 0–5)
Lymphocytes Relative: 6 % — ABNORMAL LOW (ref 12–46)
Lymphs Abs: 0.5 10*3/uL — ABNORMAL LOW (ref 0.7–4.0)
Monocytes Absolute: 1.3 10*3/uL — ABNORMAL HIGH (ref 0.1–1.0)
Monocytes Relative: 15 % — ABNORMAL HIGH (ref 3–12)
Neutro Abs: 6.3 10*3/uL (ref 1.7–7.7)
Neutrophils Relative %: 78 % — ABNORMAL HIGH (ref 43–77)

## 2014-06-03 LAB — CBC
HEMATOCRIT: 32.1 % — AB (ref 39.0–52.0)
HEMATOCRIT: 29.9 % — AB (ref 39.0–52.0)
HEMOGLOBIN: 10.1 g/dL — AB (ref 13.0–17.0)
Hemoglobin: 10.5 g/dL — ABNORMAL LOW (ref 13.0–17.0)
MCH: 28.5 pg (ref 26.0–34.0)
MCH: 29.1 pg (ref 26.0–34.0)
MCHC: 32.7 g/dL (ref 30.0–36.0)
MCHC: 33.8 g/dL (ref 30.0–36.0)
MCV: 87 fL (ref 78.0–100.0)
MCV: 86.2 fL (ref 78.0–100.0)
PLATELETS: 189 10*3/uL (ref 150–400)
Platelets: 174 10*3/uL (ref 150–400)
RBC: 3.69 MIL/uL — AB (ref 4.22–5.81)
RBC: 3.47 MIL/uL — ABNORMAL LOW (ref 4.22–5.81)
RDW: 13.5 % (ref 11.5–15.5)
RDW: 13.5 % (ref 11.5–15.5)
WBC: 8.9 10*3/uL (ref 4.0–10.5)
WBC: 8.1 10*3/uL (ref 4.0–10.5)

## 2014-06-03 LAB — COMPREHENSIVE METABOLIC PANEL
ALT: 13 U/L (ref 0–53)
ALT: 17 U/L (ref 0–53)
AST: 21 U/L (ref 0–37)
AST: 27 U/L (ref 0–37)
Albumin: 2.1 g/dL — ABNORMAL LOW (ref 3.5–5.2)
Albumin: 1.9 g/dL — ABNORMAL LOW (ref 3.5–5.2)
Alkaline Phosphatase: 74 U/L (ref 39–117)
Alkaline Phosphatase: 75 U/L (ref 39–117)
Anion gap: 12 (ref 5–15)
Anion gap: 10 (ref 5–15)
BILIRUBIN TOTAL: 1 mg/dL (ref 0.3–1.2)
BUN: 51 mg/dL — ABNORMAL HIGH (ref 6–23)
BUN: 59 mg/dL — ABNORMAL HIGH (ref 6–23)
CALCIUM: 7.3 mg/dL — AB (ref 8.4–10.5)
CALCIUM: 7.3 mg/dL — AB (ref 8.4–10.5)
CHLORIDE: 98 mmol/L (ref 96–112)
CO2: 22 mmol/L (ref 19–32)
CO2: 23 mmol/L (ref 19–32)
CREATININE: 4.71 mg/dL — AB (ref 0.50–1.35)
Chloride: 98 mmol/L (ref 96–112)
Creatinine, Ser: 3.92 mg/dL — ABNORMAL HIGH (ref 0.50–1.35)
GFR calc Af Amer: 13 mL/min — ABNORMAL LOW (ref >90)
GFR, EST AFRICAN AMERICAN: 16 mL/min — AB (ref >90)
GFR, EST NON AFRICAN AMERICAN: 14 mL/min — AB (ref >90)
GFR, EST NON AFRICAN AMERICAN: 11 mL/min — AB (ref >90)
Glucose, Bld: 192 mg/dL — ABNORMAL HIGH (ref 70–99)
Glucose, Bld: 161 mg/dL — ABNORMAL HIGH (ref 70–99)
Potassium: 4 mmol/L (ref 3.5–5.1)
Potassium: 4 mmol/L (ref 3.5–5.1)
SODIUM: 132 mmol/L — AB (ref 135–145)
Sodium: 131 mmol/L — ABNORMAL LOW (ref 135–145)
Total Bilirubin: 0.6 mg/dL (ref 0.3–1.2)
Total Protein: 5.6 g/dL — ABNORMAL LOW (ref 6.0–8.3)
Total Protein: 5.5 g/dL — ABNORMAL LOW (ref 6.0–8.3)

## 2014-06-03 LAB — URIC ACID: URIC ACID, SERUM: 8.3 mg/dL — AB (ref 4.0–7.8)

## 2014-06-03 LAB — CREATININE, URINE, RANDOM: Creatinine, Urine: 157.53 mg/dL

## 2014-06-03 LAB — URINE MICROSCOPIC-ADD ON

## 2014-06-03 LAB — PHOSPHORUS: PHOSPHORUS: 3.9 mg/dL (ref 2.3–4.6)

## 2014-06-03 LAB — GLUCOSE, CAPILLARY
GLUCOSE-CAPILLARY: 160 mg/dL — AB (ref 70–99)
GLUCOSE-CAPILLARY: 190 mg/dL — AB (ref 70–99)
GLUCOSE-CAPILLARY: 202 mg/dL — AB (ref 70–99)
Glucose-Capillary: 156 mg/dL — ABNORMAL HIGH (ref 70–99)
Glucose-Capillary: 163 mg/dL — ABNORMAL HIGH (ref 70–99)

## 2014-06-03 LAB — SODIUM, URINE, RANDOM: SODIUM UR: 15 mmol/L

## 2014-06-03 LAB — CK: CK TOTAL: 205 U/L (ref 7–232)

## 2014-06-03 LAB — LACTATE DEHYDROGENASE: LDH: 171 U/L (ref 94–250)

## 2014-06-03 LAB — TSH: TSH: 1.724 u[IU]/mL (ref 0.350–4.500)

## 2014-06-03 MED ORDER — FUROSEMIDE 80 MG PO TABS
160.0000 mg | ORAL_TABLET | Freq: Three times a day (TID) | ORAL | Status: DC
  Administered 2014-06-03: 160 mg via ORAL
  Filled 2014-06-03: qty 4
  Filled 2014-06-03 (×4): qty 2

## 2014-06-03 MED ORDER — HYDRALAZINE HCL 25 MG PO TABS
25.0000 mg | ORAL_TABLET | Freq: Two times a day (BID) | ORAL | Status: DC
  Administered 2014-06-04 – 2014-06-05 (×3): 25 mg via ORAL
  Filled 2014-06-03 (×5): qty 1

## 2014-06-03 MED ORDER — HYDRALAZINE HCL 50 MG PO TABS
60.0000 mg | ORAL_TABLET | Freq: Three times a day (TID) | ORAL | Status: DC
  Filled 2014-06-03 (×2): qty 1

## 2014-06-03 MED ORDER — ISOSORBIDE MONONITRATE 15 MG HALF TABLET
15.0000 mg | ORAL_TABLET | Freq: Every day | ORAL | Status: DC
  Administered 2014-06-03 – 2014-06-11 (×9): 15 mg via ORAL
  Filled 2014-06-03 (×9): qty 1

## 2014-06-03 NOTE — Progress Notes (Signed)
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  Admitted with a/c HF, HTN and respiratory distress.  Tmax 100.8 . Abx stopped by [facility]. Suspected viral source.  Flu swab sent (still pending)  Yesterday he was given fluid bolus due to worsening renal function and hydralazine was increased for ongoing HTN. Complaining of leg pain. Denies SOB   Creatinine at [facility] 2.4 >2.9 > 3.5>3.92   Stress Test 06/02/14  1. Scar with mild peri-infarct ischemia consistent with infarction in the entire RCA distribution 2. Inferior wall akinesis. 3. Left ventricular ejection fraction 34% 4. High-risk stress test findings*. Minimal reversible ischemia. Moderately severe ischemic cardiomyopathy  Renal Ultrasound- No hydronephrosis. Small bilateral cysts. L>R ECHO EF 40-45% Akinesis inferior and inferolateral wall. Grade II DD.  Lexiscan Myvoiew 06/02/14: There is a large and severe inferior and inferoseptal perfusion defect with minimal reversibility around the defect edges. This is consistent with a large infarction in the right coronary artery distribution with mild peri-infarct ischemia.  Objective:   Weight Range:  Vital Signs:   Temp:  [98.4 F (36.9 C)-102.3 F (39.1 C)] 100.8 F (38.2 C) (02/25 0402) Pulse Rate:  [83-114] 108 (02/25 0500) Resp:  [18-28] 26 (02/25 0500) BP: (94-193)/(40-93) 118/53 mmHg (02/25 0500) SpO2:  [90 %-100 %] 91 % (02/25 0500) Weight:  [237 lb 10.5 oz (107.8 kg)] 237 lb 10.5 oz (107.8 kg) (02/25 0500) Last BM Date: 06/02/14  Weight change: Filed Weights   06/01/14 0500 06/02/14 0335 06/03/14 0500  Weight: 239 lb 14.4 oz (108.818 kg) 243 lb 2.7 oz (110.3 kg) 237 lb 10.5 oz (107.8 kg)    Intake/Output:   Intake/Output Summary (Last 24 hours) at 06/03/14 0726 Last data filed at 06/03/14 0700  Gross per 24 hour  Intake 1345.5 ml   Output   1525 ml  Net -179.5 ml     Physical Exam: General: Elderly male lying flat in bed.No dyspnea HEENT: normal Neck: supple. JVP 7-8. Carotids 2+ bilat; no bruits. No lymphadenopathy or thryomegaly appreciated. Cor: PMI nondisplaced. Regular rate & rhythm. No rubs, gallops or murmurs. No S3.  Lungs: crackles in bases on 4 liters [place] Abdomen: soft, nontender, nondistended. No hepatosplenomegaly. No bruits or masses. Good bowel sounds. Extremities: + cyanosis, clubbing, rash, R and LLE tr edema. DPs trace bialterally Neuro: alert & orientedx3, cranial nerves grossly intact. moves all 4 extremities w/o difficulty. Affect pleasant  Telemetry:  SR   Labs: Basic Metabolic Panel:  Recent Labs Lab 06/01/14 0315 06/02/14 0310 06/03/14 0314  NA 136 132* 132*  K 4.7 4.4 4.0  CL 100 100 98  CO2 28 26 22   GLUCOSE 154* 190* 192*  BUN 38* 46* 51*  CREATININE 2.95* 3.49* 3.92*  CALCIUM 7.8* 7.3* 7.3*    Liver Function Tests:  Recent Labs Lab 06/03/14 0314  AST 21  ALT 13  ALKPHOS 74  BILITOT 1.0  PROT 5.6*  ALBUMIN 2.1*   No results for input(s): LIPASE, AMYLASE in the last 168 hours. No results for input(s): AMMONIA in the last 168 hours.  CBC:  Recent Labs Lab 06/01/14 0315 06/02/14 0310 06/03/14 0314  WBC 5.0 6.7 8.9  HGB 10.5* 10.9* 10.5*  HCT 31.2* 32.3* 32.1*  MCV 86.4 87.1 87.0  PLT 171 170 189    Cardiac Enzymes: No results for input(s): CKTOTAL, CKMB, CKMBINDEX, TROPONINI in  the last 168 hours.  BNP: BNP (last 3 results) No results for input(s): BNP in the last 8760 hours.  ProBNP (last 3 results) No results for input(s): PROBNP in the last 8760 hours.    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  EXAM: MYOCARDIAL IMAGING WITH SPECT (PHARMACOLOGIC-STRESS)  GATED  LEFT VENTRICULAR WALL MOTION STUDY  LEFT VENTRICULAR EJECTION FRACTION  TECHNIQUE: Intravenous infusion of Lexiscan was performed under the supervision of the Cardiology staff. At peak effect of the drug, 10 mCi Tc-4m sestamibi was injected intravenously and standard myocardial SPECT imaging was performed. Quantitative gated imaging was also performed to evaluate left ventricular wall motion, and estimate left ventricular ejection fraction.  COMPARISON:  None.  FINDINGS: Perfusion: There is a large and severe inferior and inferoseptal perfusion defect with minimal reversibility around the defect edges. This is consistent with a large infarction in the right coronary artery distribution with mild peri-infarct ischemia.  Wall Motion: Mild global hypokinesis with inferior wall akinesis. Moderate left ventricular dilation.  Left Ventricular Ejection Fraction: 34 %  End diastolic volume 326 ml  End systolic volume 712 ml  IMPRESSION: 1. Scar with mild peri-infarct ischemia consistent with infarction in the entire RCA distribution  2.  Inferior wall akinesis.  3. Left ventricular ejection fraction 34%  4. High-risk stress test findings*. Minimal reversible ischemia. Moderately severe ischemic cardiomyopathy  *2012 Appropriate Use Criteria for Coronary Revascularization Focused Update: J Am Coll Cardiol. 4580;99(8):338-250. http://content.airportbarriers.com.aspx?articleid=1201161   Electronically Signed   By: Sanda Klein   On: 06/02/2014 13:42     Medications:     Scheduled Medications: . allopurinol  100 mg Oral BID  . antiseptic oral rinse  7 mL Mouth Rinse BID  . atorvastatin  40 mg Oral q1800  . clopidogrel  75 mg Oral Daily  . heparin  5,000 Units Subcutaneous 3 times per day  . hydrALAZINE  75 mg Oral 3 times per day  . insulin aspart  0-9 Units Subcutaneous TID WC  . oseltamivir  30 mg Oral Daily  . pregabalin  75 mg Oral BID  . psyllium  1 packet Oral QHS  . sertraline  50 mg Oral QHS  .  sodium chloride  3 mL Intravenous Q12H    Infusions:    PRN Medications: sodium chloride, acetaminophen **OR** acetaminophen, diphenhydrAMINE, hydrALAZINE, HYDROcodone-acetaminophen, hydrocortisone cream, morphine injection, ondansetron (ZOFRAN) IV, oxymetazoline   Assessment:   1. A/C Systolic HF 2. Acute Respiratory Failure- on Bipap now weaned to nasal cannula.  3. Hypertensive crisis 4. DM 5. Acute on CKD, stage IV - Creatinine baseline 2.5 6. 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 Chest Pain 1 week ago 8. Fever - ? Acute bronchitis vs flu 9. Leg Pain    Plan/Discussion:   ? Infectious process. Concern for the flu. Results pending. Antibiotics stopped per [facility] thought to be viral in nature. Tmax 100.8. Await cx from [facility]  Renal function continues to get worse off diuretics. Creatinine 2.95>3.49>3.9. Continue hydralazine 75 mg. Add 15 mg  imdur  daily.    Echo is suggestive of underlying CAD and suspect this may be severe but not cath candidate due to renal failure. Lexiscan confirms but no significant ischemia. Given lack of anginal symptoms and worsening renal function will continue with med management.  PT following.   CLEGG,AMY, NP-C  7:26 AM   Patient seen and examined with Darrick Grinder, NP. We discussed all aspects of the encounter. I agree with the assessment and plan as stated above.   Remains febrile. Renal function worsening. Agree with holding diuretics. Volume status looks ok now can stop IVF and let him equilibrate. [facility] working up fevers. Agree it is likely viral. Await flu swab results. Continue medical management of CAD.   Daniel Bensimhon,MD 8:49 AM

## 2014-06-03 NOTE — Progress Notes (Signed)
[place] TEAM 1 - Stepdown/ICU TEAM Progress Note  ANCHOR DWAN WJX:[id number] DOB: 12-03-1940 DOA: 05/31/2014 PCP: [name], MD  Admit HPI / Brief Narrative: Darrell Richardson is a 74 y.o. WM PMHx  diabetes type II uncontrolled, CHF, HTN, CKD and CVA Residual Lt Hemiparesis came in to [facility] complaining about shortness of breath. They don't have beds and stepdown so they transferred him to [facility] for further medical evaluation. This gentleman gets his cardiac care through her clinic, since last night he was complaining about shortness of breath, orthopnea, increased lower extremity edema he couldn't sleep very well so he came into the hospital in a.m. Was found to be in acute respiratory failure secondary to acute CHF exacerbation, placed on BiPAP and sent to [facility] for further evaluation. In the ED troponin was negative, creatinine 2.44, hemoglobin 13.3. BNP is 7363 (reference value 0-125 pg/mL). Patient was given 1 dose of Lasix and sent for further evaluation.   HPI/Subjective: 2/25 Lethargic, will wake with stimulation but will not interact much before dosing offf. .  Assessment/Plan:  Cardiomyopathy/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 CHF -Patient presented with SOB, orthopnea, lower extremity edema and BNP of 7300. -Continue BiPAP.PRN -Strict In and Out; Since Admission +952ml - Daily Am Weight;Admission wt= 108.3 Kg             2/25 Standing wt= 107.8 Kg -Losartan held because of worsening creatinine.  Acute on chronic respiratory failure with hypoxia/bronchitis? - Positive fever on Admission, negative leukocytosis, bands or left shift. Continues spike fevers.  -PE?  -Titrate O2/BiPAP to maintain SPO2 89-93%   Hypertension -Pt now Hypotensive -Consult nephrology - Decrease Hydralazine to 25mg  BID  -Lasix 160 mg TID -Imdur 15 mg daily -Check labs ordered by nephrology  Diabetes mellitus type 2 uncontrolled -2/22  hemoglobin A1c = 7.5 . -Continue sensitive SSI .   Acute on chronic renal failure  (baseline 2.5 and 2.8),  -2/25 Currently Cr= 3.9, continue to increase consult nephrology  History of CVA -Residual left hemiparesis. Per wife did not attend inpatient rehabilitation  -PT/OT evaluate for CIR  vs SNF This is stable, patient is on Plavix, continued.    Code Status: FULL Family Communication:  family present at time of exam Disposition Plan: Per cardiology and nephrology    Consultants: Dr.Daniel R Bensimhon (cardiology heart failure team) Dr.James L Deterding (nephrology)    Procedure/Significant Events: 2/22 echocardiogram;- Left ventricle: mild LVH. -LVEF= 40% to 45%. akinesis of inferolateral and inferior myocardium.  -(grade 2 diastolic dysfunction).  - Left atrium: severely dilated. - Pericardium, extracardiac: A small pericardial effusion was identified.  2/22 PCXR;Congestive heart failure.   Culture 2/22 MRSA by PCR negative 2/23 sputum pending 2/23 respiratory virus panel pending   Antibiotics: Azithromycin 2/23>> stopped 2/24 Vancomycin 2/24>> stopped 2/24 Tamiflu 2/24>>   DVT prophylaxis: Subcutaneous heparin   Devices NA   LINES / TUBES:  NA    Continuous Infusions:    Objective: VITAL SIGNS: Temp: 100.2 F (37.9 C) (02/25 1600) Temp Source: Axillary (02/25 1600) BP: 100/69 mmHg (02/25 1800) Pulse Rate: 106 (02/25 1800) SPO2; FIO2:   Intake/Output Summary (Last 24 hours) at 06/03/14 2005 Last data filed at 06/03/14 1800  Gross per 24 hour  Intake   1210 ml  Output   1025 ml  Net    185 ml     Exam: General: Lethargic, arousable, however falls back to sleep a soon is you stop directly engaging.  clearly uncomfortable A/O x 4 sitting in chair, No acute respiratory distress Lungs: Clear to auscultation bilaterally without wheezes or crackles Cardiovascular: Tachycardic, Regular rhythm without murmur gallop or rub normal S1 and  S2 Abdomen: Obese, Nontender, nondistended, soft, bowel sounds positive, no rebound, no ascites, no appreciable mass Extremities: No significant cyanosis, clubbing, or edema bilateral lower extremities, bilateral pain to palpation lower extremity, new erythematous rash bilateral calves Neurologic;    Data Reviewed: Basic Metabolic Panel:  Recent Labs Lab 06/01/14 0315 06/02/14 0310 06/03/14 0314  NA 136 132* 132*  K 4.7 4.4 4.0  CL 100 100 98  CO2 28 26 22   GLUCOSE 154* 190* 192*  BUN 38* 46* 51*  CREATININE 2.95* 3.49* 3.92*  CALCIUM 7.8* 7.3* 7.3*   Liver Function Tests:  Recent Labs Lab 06/03/14 0314  AST 21  ALT 13  ALKPHOS 74  BILITOT 1.0  PROT 5.6*  ALBUMIN 2.1*   No results for input(s): LIPASE, AMYLASE in the last 168 hours. No results for input(s): AMMONIA in the last 168 hours. CBC:  Recent Labs Lab 06/01/14 0315 06/02/14 0310 06/03/14 0314  WBC 5.0 6.7 8.9  HGB 10.5* 10.9* 10.5*  HCT 31.2* 32.3* 32.1*  MCV 86.4 87.1 87.0  PLT 171 170 189   Cardiac Enzymes: No results for input(s): CKTOTAL, CKMB, CKMBINDEX, TROPONINI in the last 168 hours. BNP (last 3 results) No results for input(s): BNP in the last 8760 hours.  ProBNP (last 3 results) No results for input(s): PROBNP in the last 8760 hours.  CBG:  Recent Labs Lab 06/02/14 1641 06/02/14 2128 06/03/14 0820 06/03/14 1155 06/03/14 1614  GLUCAP 167* 160* 156* 190* 202*    Recent Results (from the past 240 hour(s))  MRSA PCR Screening     Status: None   Collection Time: 05/31/14 10:55 AM  Result Value Ref Range Status   MRSA by PCR NEGATIVE NEGATIVE Final    Comment:        The GeneXpert MRSA Assay (FDA approved for NASAL specimens only), is one component of a comprehensive MRSA colonization surveillance program. It is not intended to diagnose MRSA infection nor to guide or monitor treatment for MRSA infections.   Culture, blood (routine x 2)     Status: None (Preliminary  result)   Collection Time: 06/01/14 10:33 PM  Result Value Ref Range Status   Specimen Description BLOOD RIGHT HAND  Final   Special Requests BOTTLES DRAWN AEROBIC ONLY 5CC  Final   Culture   Final           BLOOD CULTURE RECEIVED NO GROWTH TO DATE CULTURE WILL BE HELD FOR 5 DAYS BEFORE ISSUING A FINAL NEGATIVE REPORT Performed at [facility]    Report Status PENDING  Incomplete  Culture, blood (routine x 2)     Status: None (Preliminary result)   Collection Time: 06/01/14 10:45 PM  Result Value Ref Range Status   Specimen Description BLOOD LEFT HAND  Final   Special Requests BOTTLES DRAWN AEROBIC ONLY 4CC  Final   Culture   Final           BLOOD CULTURE RECEIVED NO GROWTH TO DATE CULTURE WILL BE HELD FOR 5 DAYS BEFORE ISSUING A FINAL NEGATIVE REPORT Performed at [facility]    Report Status PENDING  Incomplete     Studies:  Recent x-ray studies have been reviewed in detail by the Attending Physician  Scheduled Meds:  Scheduled Meds: . allopurinol  100 mg Oral  BID  . antiseptic oral rinse  7 mL Mouth Rinse BID  . clopidogrel  75 mg Oral Daily  . furosemide  160 mg Oral TID  . heparin  5,000 Units Subcutaneous 3 times per day  . hydrALAZINE  25 mg Oral BID  . insulin aspart  0-9 Units Subcutaneous TID WC  . isosorbide mononitrate  15 mg Oral Daily  . oseltamivir  30 mg Oral Daily  . pregabalin  75 mg Oral BID  . psyllium  1 packet Oral QHS  . sertraline  50 mg Oral QHS  . sodium chloride  3 mL Intravenous Q12H    Time spent on care of this patient: 40 mins   Allie Bossier [facility]  [facility] Office  [phone] Pager - [phone]  On-Call/Text Page:      [email or web address]      password [email or web address]  If 7PM-7AM, please contact night-coverage [email or web address] Password [email or web address] 06/03/2014, 8:05 PM   LOS: 3 days   Care during the described time interval was provided by me .  I have reviewed this patient's available data, including medical history,  events of note, physical examination, radiology studies and test results as part of my evaluation  Dia Crawford, MD [phone] Pager

## 2014-06-03 NOTE — Consult Note (Signed)
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 Antonietta Richardson is an 74 y.o. male.  HPI: 74 yr male with at least 8 yr DM and had ^ BS prior to that. Also that long HTN but not measured prior.  Presented with R hemiparesis, with also speech and swallowing prob at that time. Subsequently another CVA in same distribution.  Has cardiomyopathy, EF 40-45 % and over 30 yr of gout.  Presented with progressive SOB to [facility] and transferred here on BIPAP . Cr has gone from 2.4 to 3.9.  With bp control and mild diuresis.  The family knows of renal dz, but never eval, was to see Nephrology.  No hx stones, hematuria, UTIs, or recent NSAIDs.  No FH of inherited hearing or eye defects or ms skel.  Has rx to AB at [facility] and here Rocephin, and Levaquin.  Also past rx to Cortizone and Ambien.  He is very confused and agitated and most of hx from family. Had urinary retention here. Constitutional: not active, aching all over esp feet and legs.   Eyes: some prob , no eye Dr. in over 2 yr Ears, nose, mouth, throat, and face: HOH, dentures,  Respiratory: AS above, SOB with minimal activity prior Cardiovascular: signif edema, no CP Gastrointestinal: recent indigestion Genitourinary:as above, usually can void, noct x 3-4 Integument/breast: rash with AB Musculoskeletal:diffuse ms aches, esp feet and calves Neurological: as above, R sided weak, confused now Behavioral/Psych: as above, very  stubborn Endocrine: DM Allergic/Immunologic: see above   Past Medical History  Diagnosis Date  . Hearing loss   . Gout   . Diabetes   . Fatigue   . Hypertension   . Stroke     X 2  . Heart failure   . Muscle pain   . Hammertoe     Past Surgical History  Procedure Laterality Date  . Stomach surgery    . Foot surgery Right     Family History  Problem Relation Age of Onset  . Dementia Mother     Social History:  reports that he has quit smoking. He does not have any smokeless tobacco history on  file. He reports that he drinks alcohol. His drug history is not on file.  Allergies:  Allergies  Allergen Reactions  . Cortisone Anaphylaxis and Other (See Comments)    Cold, elevated BP, shaking all over and fever  Wife says tolerates hydrocortisone cream at home  . Ambien [Zolpidem Tartrate]     Did not tolerate well or remembered anything from previous night  . Rocephin [Ceftriaxone] Hives  . Levaquin [Levofloxacin In D5w] Rash    Medications:  I have reviewed the patient's current medications. Prior to Admission:  Prescriptions prior to admission  Medication Sig Dispense Refill Last Dose  . albuterol (PROAIR HFA) 108 (90 BASE) MCG/ACT inhaler Inhale 2 puffs into the lungs every 4 (four) hours as needed for wheezing or shortness of breath. Inhale 1-2 puffs four times daily as needed   05/30/2014 at Unknown time  . allopurinol (ZYLOPRIM) 100 MG tablet Take 100 mg by mouth 2 (two) times daily.   05/30/2014 at Unknown time  . amLODipine (NORVASC) 5 MG tablet Take 5 mg by mouth 2 (two) times daily.   05/30/2014 at Unknown time  . atorvastatin (LIPITOR) 20 MG tablet Take 40 mg by mouth daily.    05/30/2014 at Unknown time  . Cholecalciferol (VITAMIN D3) 1000 UNITS CAPS Take 1,000 Units by mouth daily.  05/30/2014 at Unknown time  . clopidogrel (PLAVIX) 75 MG tablet Take 75 mg by mouth daily with breakfast.   05/30/2014 at Unknown time  . docusate sodium (COLACE) 100 MG capsule Take 100 mg by mouth at bedtime.   05/30/2014 at Unknown time  . furosemide (LASIX) 80 MG tablet Take 80 mg by mouth daily.   05/30/2014 at Unknown time  . HYDROcodone-acetaminophen (VICODIN ES) 7.5-750 MG per tablet Take 1 tablet by mouth every 6 (six) hours as needed for pain. Take 1/2 - 1 four times a day as needed   unknown  . losartan (COZAAR) 50 MG tablet Take 50 mg by mouth daily.   05/30/2014 at Unknown time  . metoprolol succinate (TOPROL-XL) 25 MG 24 hr tablet Take 25 mg by mouth 2 (two) times daily.    05/30/2014  at 1900  . Multiple Vitamin (MULTIVITAMIN) tablet Take 1 tablet by mouth daily.   05/30/2014 at Unknown time  . pregabalin (LYRICA) 75 MG capsule Take 75 mg by mouth 2 (two) times daily.   05/30/2014 at Unknown time  . Psyllium (METAMUCIL PO) Take by mouth at bedtime.   05/30/2014 at Unknown time  . sertraline (ZOLOFT) 50 MG tablet Take 50 mg by mouth at bedtime.   05/30/2014 at Unknown time  . sitaGLIPtin (JANUVIA) 50 MG tablet Take 50 mg by mouth daily.   05/30/2014 at Unknown time  . vitamin E 200 UNIT capsule Take 200 Units by mouth daily.   05/30/2014 at Unknown time     Results for orders placed or performed during the hospital encounter of 05/31/14 (from the past 48 hour(s))  Glucose, capillary     Status: Abnormal   Collection Time: 06/01/14  9:35 PM  Result Value Ref Range   Glucose-Capillary 149 (H) 70 - 99 mg/dL   Comment 1 Capillary Specimen   Culture, blood (routine x 2)     Status: None (Preliminary result)   Collection Time: 06/01/14 10:33 PM  Result Value Ref Range   Specimen Description BLOOD RIGHT HAND    Special Requests BOTTLES DRAWN AEROBIC ONLY 5CC    Culture             BLOOD CULTURE RECEIVED NO GROWTH TO DATE CULTURE WILL BE HELD FOR 5 DAYS BEFORE ISSUING A FINAL NEGATIVE REPORT Performed at [facility]    Report Status PENDING   Culture, blood (routine x 2)     Status: None (Preliminary result)   Collection Time: 06/01/14 10:45 PM  Result Value Ref Range   Specimen Description BLOOD LEFT HAND    Special Requests BOTTLES DRAWN AEROBIC ONLY 4CC    Culture             BLOOD CULTURE RECEIVED NO GROWTH TO DATE CULTURE WILL BE HELD FOR 5 DAYS BEFORE ISSUING A FINAL NEGATIVE REPORT Performed at [facility]    Report Status PENDING   Basic metabolic panel     Status: Abnormal   Collection Time: 06/02/14  3:10 AM  Result Value Ref Range   Sodium 132 (Richardson) 135 - 145 mmol/Richardson   Potassium 4.4 3.5 - 5.1 mmol/Richardson   Chloride 100 96 - 112 mmol/Richardson   CO2 26 19 -  32 mmol/Richardson   Glucose, Bld 190 (H) 70 - 99 mg/dL   BUN 46 (H) 6 - 23 mg/dL   Creatinine, Ser 3.49 (H) 0.50 - 1.35 mg/dL   Calcium 7.3 (Richardson) 8.4 - 10.5 mg/dL   GFR calc non  Af Amer 16 (Richardson) >90 mL/min   GFR calc Af Amer 19 (Richardson) >90 mL/min    Comment: (NOTE) The eGFR has been calculated using the CKD EPI equation. This calculation has not been validated in all clinical situations. eGFR's persistently <90 mL/min signify possible Chronic Kidney Disease.    Anion gap 6 5 - 15  CBC     Status: Abnormal   Collection Time: 06/02/14  3:10 AM  Result Value Ref Range   WBC 6.7 4.0 - 10.5 K/uL   RBC 3.71 (Richardson) 4.22 - 5.81 MIL/uL   Hemoglobin 10.9 (Richardson) 13.0 - 17.0 g/dL   HCT 32.3 (Richardson) 39.0 - 52.0 %   MCV 87.1 78.0 - 100.0 fL   MCH 29.4 26.0 - 34.0 pg   MCHC 33.7 30.0 - 36.0 g/dL   RDW 13.4 11.5 - 15.5 %   Platelets 170 150 - 400 K/uL  Urinalysis, Routine w reflex microscopic     Status: Abnormal   Collection Time: 06/02/14  5:14 AM  Result Value Ref Range   Color, Urine YELLOW YELLOW   APPearance CLEAR CLEAR   Specific Gravity, Urine 1.017 1.005 - 1.030   pH 6.0 5.0 - 8.0   Glucose, UA 250 (A) NEGATIVE mg/dL   Hgb urine dipstick NEGATIVE NEGATIVE   Bilirubin Urine NEGATIVE NEGATIVE   Ketones, ur NEGATIVE NEGATIVE mg/dL   Protein, ur >300 (A) NEGATIVE mg/dL   Urobilinogen, UA 0.2 0.0 - 1.0 mg/dL   Nitrite NEGATIVE NEGATIVE   Leukocytes, UA NEGATIVE NEGATIVE  Urine microscopic-add on     Status: Abnormal   Collection Time: 06/02/14  5:14 AM  Result Value Ref Range   Squamous Epithelial / LPF FEW (A) RARE   WBC, UA 0-2 <3 WBC/hpf   RBC / HPF 0-2 <3 RBC/hpf   Bacteria, UA FEW (A) RARE   Casts HYALINE CASTS (A) NEGATIVE  Glucose, capillary     Status: Abnormal   Collection Time: 06/02/14  7:39 AM  Result Value Ref Range   Glucose-Capillary 200 (H) 70 - 99 mg/dL   Comment 1 Notify RN   Glucose, capillary     Status: Abnormal   Collection Time: 06/02/14 11:22 AM  Result Value Ref Range    Glucose-Capillary 168 (H) 70 - 99 mg/dL   Comment 1 Capillary Specimen   Glucose, capillary     Status: Abnormal   Collection Time: 06/02/14  4:41 PM  Result Value Ref Range   Glucose-Capillary 167 (H) 70 - 99 mg/dL   Comment 1 Capillary Specimen   Glucose, capillary     Status: Abnormal   Collection Time: 06/02/14  9:28 PM  Result Value Ref Range   Glucose-Capillary 160 (H) 70 - 99 mg/dL  Comprehensive metabolic panel     Status: Abnormal   Collection Time: 06/03/14  3:14 AM  Result Value Ref Range   Sodium 132 (Richardson) 135 - 145 mmol/Richardson   Potassium 4.0 3.5 - 5.1 mmol/Richardson   Chloride 98 96 - 112 mmol/Richardson   CO2 22 19 - 32 mmol/Richardson   Glucose, Bld 192 (H) 70 - 99 mg/dL   BUN 51 (H) 6 - 23 mg/dL   Creatinine, Ser 3.92 (H) 0.50 - 1.35 mg/dL   Calcium 7.3 (Richardson) 8.4 - 10.5 mg/dL   Total Protein 5.6 (Richardson) 6.0 - 8.3 g/dL   Albumin 2.1 (Richardson) 3.5 - 5.2 g/dL   AST 21 0 - 37 U/Richardson   ALT 13 0 - 53 U/Richardson  Alkaline Phosphatase 74 39 - 117 U/Richardson   Total Bilirubin 1.0 0.3 - 1.2 mg/dL   GFR calc non Af Amer 14 (Richardson) >90 mL/min   GFR calc Af Amer 16 (Richardson) >90 mL/min    Comment: (NOTE) The eGFR has been calculated using the CKD EPI equation. This calculation has not been validated in all clinical situations. eGFR's persistently <90 mL/min signify possible Chronic Kidney Disease.    Anion gap 12 5 - 15  CBC     Status: Abnormal   Collection Time: 06/03/14  3:14 AM  Result Value Ref Range   WBC 8.9 4.0 - 10.5 K/uL   RBC 3.69 (Richardson) 4.22 - 5.81 MIL/uL   Hemoglobin 10.5 (Richardson) 13.0 - 17.0 g/dL   HCT 32.1 (Richardson) 39.0 - 52.0 %   MCV 87.0 78.0 - 100.0 fL   MCH 28.5 26.0 - 34.0 pg   MCHC 32.7 30.0 - 36.0 g/dL   RDW 13.5 11.5 - 15.5 %   Platelets 189 150 - 400 K/uL  Glucose, capillary     Status: Abnormal   Collection Time: 06/03/14  8:20 AM  Result Value Ref Range   Glucose-Capillary 156 (H) 70 - 99 mg/dL   Comment 1 Capillary Specimen   Glucose, capillary     Status: Abnormal   Collection Time: 06/03/14 11:55 AM  Result  Value Ref Range   Glucose-Capillary 190 (H) 70 - 99 mg/dL   Comment 1 Notify RN   Glucose, capillary     Status: Abnormal   Collection Time: 06/03/14  4:14 PM  Result Value Ref Range   Glucose-Capillary 202 (H) 70 - 99 mg/dL    Nm Myocar Multi W/spect W/wall Motion / Ef  06/02/2014   CLINICAL DATA:  Darrell Richardson is a 74 year old with a history of diabetes, CHF (EF unknown), CKD creatnine baseline 2.5, and CVA 2009-2010.  EXAM: MYOCARDIAL IMAGING WITH SPECT (PHARMACOLOGIC-STRESS)  GATED LEFT VENTRICULAR WALL MOTION STUDY  LEFT VENTRICULAR EJECTION FRACTION  TECHNIQUE: Intravenous infusion of Lexiscan was performed under the supervision of the Cardiology staff. At peak effect of the drug, 10 mCi Tc-26msestamibi was injected intravenously and standard myocardial SPECT imaging was performed. Quantitative gated imaging was also performed to evaluate left ventricular wall motion, and estimate left ventricular ejection fraction.  COMPARISON:  None.  FINDINGS: Perfusion: There is a large and severe inferior and inferoseptal perfusion defect with minimal reversibility around the defect edges. This is consistent with a large infarction in the right coronary artery distribution with mild peri-infarct ischemia.  Wall Motion: Mild global hypokinesis with inferior wall akinesis. Moderate left ventricular dilation.  Left Ventricular Ejection Fraction: 34 %  End diastolic volume 2449ml  End systolic volume 1675ml  IMPRESSION: 1. Scar with mild peri-infarct ischemia consistent with infarction in the entire RCA distribution  2.  Inferior wall akinesis.  3. Left ventricular ejection fraction 34%  4. High-risk stress test findings*. Minimal reversible ischemia. Moderately severe ischemic cardiomyopathy  *2012 Appropriate Use Criteria for Coronary Revascularization Focused Update: J Am Coll Cardiol. 29163;84(6):659-935 http://content.oairportbarriers.comaspx?articleid=1201161   Electronically Signed   By: MSanda Klein   On: 06/02/2014 13:42   Dg Chest Port 1 View  06/03/2014   CLINICAL DATA:  Shortness of breath, flu symptoms  EXAM: PORTABLE CHEST - 1 VIEW  COMPARISON:  Portable chest x-ray of May 31, 2014  FINDINGS: The lungs are adequately inflated. The interstitial markings are less congested today. The cardiac silhouette remains enlarged. The  pulmonary vascularity remains engorged but there is less cephalization of the vascular pattern. The retrocardiac region on the left is less dense.  IMPRESSION: Findings consistent with mild improvement in pulmonary interstitial edema secondary to CHF. When the patient can tolerate the procedure, a PA and lateral chest x-ray would be useful.   Electronically Signed   By: David  Martinique   On: 06/03/2014 07:30    ROS Blood pressure 100/69, pulse 106, temperature 100.2 F (37.9 C), temperature source Axillary, resp. rate 25, height 5' 8"  (1.727 m), weight 107.8 kg (237 lb 10.5 oz), SpO2 95 %. Physical Exam Physical Examination: General appearance - overweight, uncooperative and confused Mental status - only occ appropriate answer, aggitated, confused Eyes - pupils equal and reactive, extraocular eye movements intact, funduscopic exam abnormal DM retinopathy,, not well seen Mouth - mucous membranes moist, pharynx normal without lesions and upper and lower plates Neck - supple, , adenopathy noted PCL Lymphatics - posterior cervical nodes Chest - decreased bs, rales in bases Heart - S1 and S2 normal, systolic murmur PP9/4 at apex Abdomen - obese, pos bs, soft, mod distension, liver down 6 cm Extremities - peripheral pulses abnormal no DP, trophic changes in feet.  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  Vol xs, proteinuria, mild acidemia.  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 Scarring on U/S c/w some component of HTn  or vasc dz.  Need to R/O AIN with rash, and fevers.  Also eval for secondary complications.  Very hgh liklihood will progress.  Need to eval proteinuria 2 CHF diruese but allow bp to ris 3 Hypertension: as above 4. Anemia eval and watch at this time 5. Metabolic Bone Disease: check PTH 6 Hx CVA 7 PVD signif changes in legs, need to eval 8 Gout check Uric Acid 9 DM  Control 10 Proteinuria  Suspect DM P lasix, decrease bp meds, PTH, SPEP. UPEP, serologies, check CK, LDH,  Urine eos, diff  Darrell Richardson 06/03/2014, 7:16 PM

## 2014-06-04 DIAGNOSIS — M1000 Idiopathic gout, unspecified site: Secondary | ICD-10-CM

## 2014-06-04 LAB — CBC WITH DIFFERENTIAL/PLATELET
BASOS ABS: 0 10*3/uL (ref 0.0–0.1)
BASOS PCT: 0 % (ref 0–1)
EOS ABS: 0.1 10*3/uL (ref 0.0–0.7)
Eosinophils Relative: 1 % (ref 0–5)
HCT: 30.7 % — ABNORMAL LOW (ref 39.0–52.0)
HEMOGLOBIN: 10.2 g/dL — AB (ref 13.0–17.0)
Lymphocytes Relative: 7 % — ABNORMAL LOW (ref 12–46)
Lymphs Abs: 0.6 10*3/uL — ABNORMAL LOW (ref 0.7–4.0)
MCH: 28.8 pg (ref 26.0–34.0)
MCHC: 33.2 g/dL (ref 30.0–36.0)
MCV: 86.7 fL (ref 78.0–100.0)
Monocytes Absolute: 1 10*3/uL (ref 0.1–1.0)
Monocytes Relative: 11 % (ref 3–12)
Neutro Abs: 7.5 10*3/uL (ref 1.7–7.7)
Neutrophils Relative %: 81 % — ABNORMAL HIGH (ref 43–77)
Platelets: 191 10*3/uL (ref 150–400)
RBC: 3.54 MIL/uL — ABNORMAL LOW (ref 4.22–5.81)
RDW: 13.4 % (ref 11.5–15.5)
WBC: 9.3 10*3/uL (ref 4.0–10.5)

## 2014-06-04 LAB — COMPREHENSIVE METABOLIC PANEL
ALK PHOS: 79 U/L (ref 39–117)
ALT: 21 U/L (ref 0–53)
AST: 28 U/L (ref 0–37)
Albumin: 1.9 g/dL — ABNORMAL LOW (ref 3.5–5.2)
Anion gap: 15 (ref 5–15)
BUN: 65 mg/dL — ABNORMAL HIGH (ref 6–23)
CALCIUM: 7.5 mg/dL — AB (ref 8.4–10.5)
CO2: 22 mmol/L (ref 19–32)
Chloride: 97 mmol/L (ref 96–112)
Creatinine, Ser: 5.01 mg/dL — ABNORMAL HIGH (ref 0.50–1.35)
GFR calc Af Amer: 12 mL/min — ABNORMAL LOW (ref >90)
GFR calc non Af Amer: 10 mL/min — ABNORMAL LOW (ref >90)
Glucose, Bld: 191 mg/dL — ABNORMAL HIGH (ref 70–99)
POTASSIUM: 4.3 mmol/L (ref 3.5–5.1)
SODIUM: 134 mmol/L — AB (ref 135–145)
Total Bilirubin: 0.8 mg/dL (ref 0.3–1.2)
Total Protein: 5.8 g/dL — ABNORMAL LOW (ref 6.0–8.3)

## 2014-06-04 LAB — RESPIRATORY VIRUS PANEL
Adenovirus: NEGATIVE
INFLUENZA A: NEGATIVE
Influenza B: NEGATIVE
Metapneumovirus: NEGATIVE
PARAINFLUENZA 1 A: NEGATIVE
PARAINFLUENZA 2 A: NEGATIVE
Parainfluenza 3: NEGATIVE
RHINOVIRUS: NEGATIVE
Respiratory Syncytial Virus A: NEGATIVE
Respiratory Syncytial Virus B: NEGATIVE

## 2014-06-04 LAB — GLUCOSE, CAPILLARY
GLUCOSE-CAPILLARY: 168 mg/dL — AB (ref 70–99)
GLUCOSE-CAPILLARY: 220 mg/dL — AB (ref 70–99)
GLUCOSE-CAPILLARY: 201 mg/dL — AB (ref 70–99)
Glucose-Capillary: 172 mg/dL — ABNORMAL HIGH (ref 70–99)

## 2014-06-04 LAB — PROTEIN / CREATININE RATIO, URINE
Creatinine, Urine: 23.63 mg/dL
PROTEIN CREATININE RATIO: 6.6 — AB (ref 0.00–0.15)
TOTAL PROTEIN, URINE: 156 mg/dL

## 2014-06-04 LAB — MAGNESIUM: Magnesium: 2.1 mg/dL (ref 1.5–2.5)

## 2014-06-04 MED ORDER — PREDNISONE 20 MG PO TABS
40.0000 mg | ORAL_TABLET | Freq: Every day | ORAL | Status: DC
  Administered 2014-06-04 – 2014-06-06 (×3): 40 mg via ORAL
  Filled 2014-06-04 (×5): qty 2

## 2014-06-04 NOTE — Progress Notes (Signed)
PT Cancellation Note  Patient Details Name: Darrell Richardson MRN: [id number] DOB: 1940-11-21   Cancelled Treatment:    Reason Eval/Treat Not Completed: Pain limiting ability to participate, spoke with nursing to confirm, will hold PT at this time and plan to evaluate as patient tolerance improves.   Darrell Richardson 06/04/2014, 2:09 PM  Alben Deeds, [place] DPT  [phone]

## 2014-06-04 NOTE — Progress Notes (Signed)
1330 We are following pt but will let PT evaluate first and begin seeing when he can ambulate safely with safety device. Graylon Good RN BSN 06/04/2014 1:28 PM

## 2014-06-04 NOTE — Progress Notes (Signed)
Patient was feeling sick on his stomach. RT took pt off BIPAP and placed him on 3L [place]. Patient is tolerating 3L [place] with spO2 of 97%. RT will continue to monitor.

## 2014-06-04 NOTE — Progress Notes (Signed)
Dr. Clydene Laming aware that urine output has diminished this afternoon to 15-20 cc/hr. No new orders. Will continue to monitor.  Sandre Kitty

## 2014-06-04 NOTE — Progress Notes (Signed)
[facility] TEAM 1 - Stepdown/ICU TEAM Progress Note  Darrell Richardson LFY:[id number] DOB: [birth year] DOA: 05/31/2014 PCP: [name], MD  Admit HPI / Brief Narrative: Darrell Richardson is a 74 y.o. WM PMHx  diabetes type II uncontrolled, CHF, HTN, CKD and CVA Residual Lt Hemiparesis came in to [facility] complaining about shortness of breath. They don't have beds and stepdown so they transferred him to [facility] for further medical evaluation. This gentleman gets his cardiac care through her clinic, since last night he was complaining about shortness of breath, orthopnea, increased lower extremity edema he couldn't sleep very well so he came into the hospital in a.m. Was found to be in acute respiratory failure secondary to acute CHF exacerbation, placed on BiPAP and sent to [facility] for further evaluation. In the ED troponin was negative, creatinine 2.44, hemoglobin 13.3. BNP is 7363 (reference value 0-125 pg/mL). Patient was given 1 dose of Lasix and sent for further evaluation.   HPI/Subjective: 2/26 A/O x 4, joking with staff and family. Able to state he has pain in feet and hand joints. Dr Shaune Pascal Bensimhon (Cardiology) started Pt on  Prednisone 40mg  this Am. States can feel the difference in feet but hands still hurt  Assessment/Plan:  Cardiomyopathy/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 CHF -Patient presented with SOB, orthopnea, lower extremity edema and BNP of 7300. -Continue BiPAP.PRN -Strict In and Out; Since Admission +671ml - Daily Am Weight;Admission wt= 108.3 Kg             2/26 Standing wt= 108.9 Kg -Losartan held because of worsening creatinine.  Acute on chronic respiratory failure with hypoxia/bronchitis? - Positive fever on Admission, negative leukocytosis, bands or left shift. Continues spike fevers.  -Continued SOB. Patient not on O2 at home. Order VQ scan  Hpertension -Pt now Hypotensive -Consult nephrology -Decrease  Hydralazine to 25mg  BID  -Lasix 160 mg TID -Imdur 15 mg daily -Check labs ordered by nephrology  Diabetes mellitus type 2 uncontrolled -2/22 hemoglobin A1c = 7.5 . -Continue sensitive SSI .   Acute on chronic renal failure  (baseline 2.5 and 2.8),  -2/25 Currently Cr= 3.9, continue to increase consult nephrology  History of CVA -Residual left hemiparesis. Per wife did not attend inpatient rehabilitation  -PT/OT evaluate for CIR  vs SNF This is stable, patient is on Plavix, continued.  Gout -Uric acid pending -Continue prednisone 40 mg daily    Code Status: FULL Family Communication:  family present at time of exam Disposition Plan: Per cardiology and nephrology    Consultants: Dr.Daniel R Bensimhon (cardiology heart failure team) Dr.James L Deterding (nephrology)    Procedure/Significant Events: 2/22 echocardiogram;- Left ventricle: mild LVH. -LVEF= 40% to 45%. akinesis of inferolateral and inferior myocardium.  -(grade 2 diastolic dysfunction).  - Left atrium: severely dilated. - Pericardium, extracardiac: A small pericardial effusion was identified.  2/22 PCXR;Congestive heart failure. 2/22 renal ultrasound;Marland Kitchen No hydronephrosis.- Small bilateral renal cysts. Borderline increased parenchymal echogenicity right kidney. -Bilateral, Lt >>Rt  renal cortical thinning.   Culture 2/22 MRSA by PCR negative 2/23 sputum pending 2/23 respiratory virus panel pending   Antibiotics: Azithromycin 2/23>> stopped 2/24 Vancomycin 2/24>> stopped 2/24 Tamiflu 2/24>>   DVT prophylaxis: Subcutaneous heparin   Devices NA   LINES / TUBES:  NA    Continuous Infusions:    Objective: VITAL SIGNS: Temp: 98.6 F (37 C) (02/26 1244) Temp Source: Oral (02/26 1244) BP: 131/38 mmHg (02/26 1244) Pulse Rate: 82 (02/26 1244)  SPO2; FIO2:   Intake/Output Summary (Last 24 hours) at 06/04/14 1605 Last data filed at 06/04/14 1400  Gross per 24 hour  Intake    220 ml    Output    655 ml  Net   -435 ml     Exam: General A/O x 4, joking with staff and familyNo acute respiratory distress Lungs: Clear to auscultation bilaterally without wheezes or crackles Cardiovascular: Tachycardic, Regular rhythm without murmur gallop or rub normal S1 and S2 Abdomen: Obese, Nontender, nondistended, soft, bowel sounds positive, no rebound, no ascites, no appreciable mass Extremities: No significant cyanosis, clubbing, Positive swellling bilat hands, warm to touch, erythema Rt>> Lt negative bilateral lower extremities edema, erythematous rash bilateral calves resolving. Still tenderness in feet  Neurologic;    Data Reviewed: Basic Metabolic Panel:  Recent Labs Lab 06/01/14 0315 06/02/14 0310 06/03/14 0314 06/03/14 2100 06/04/14 0327  NA 136 132* 132* 131* 134*  K 4.7 4.4 4.0 4.0 4.3  CL 100 100 98 98 97  CO2 28 26 22 23 22   GLUCOSE 154* 190* 192* 161* 191*  BUN 38* 46* 51* 59* 65*  CREATININE 2.95* 3.49* 3.92* 4.71* 5.01*  CALCIUM 7.8* 7.3* 7.3* 7.3* 7.5*  MG  --   --   --   --  2.1  PHOS  --   --   --  3.9  --    Liver Function Tests:  Recent Labs Lab 06/03/14 0314 06/03/14 2100 06/04/14 0327  AST 21 27 28   ALT 13 17 21   ALKPHOS 74 75 79  BILITOT 1.0 0.6 0.8  PROT 5.6* 5.5* 5.8*  ALBUMIN 2.1* 1.9* 1.9*   No results for input(s): LIPASE, AMYLASE in the last 168 hours. No results for input(s): AMMONIA in the last 168 hours. CBC:  Recent Labs Lab 06/01/14 0315 06/02/14 0310 06/03/14 0314 06/03/14 2100 06/04/14 0327  WBC 5.0 6.7 8.9 8.1 9.3  NEUTROABS  --   --   --  6.3 7.5  HGB 10.5* 10.9* 10.5* 10.1* 10.2*  HCT 31.2* 32.3* 32.1* 29.9* 30.7*  MCV 86.4 87.1 87.0 86.2 86.7  PLT 171 170 189 174 191   Cardiac Enzymes:  Recent Labs Lab 06/03/14 2100  CKTOTAL 205   BNP (last 3 results) No results for input(s): BNP in the last 8760 hours.  ProBNP (last 3 results) No results for input(s): PROBNP in the last 8760  hours.  CBG:  Recent Labs Lab 06/03/14 1155 06/03/14 1614 06/03/14 2130 06/04/14 0835 06/04/14 1245  GLUCAP 190* 202* 163* 168* 172*    Recent Results (from the past 240 hour(s))  MRSA PCR Screening     Status: None   Collection Time: 05/31/14 10:55 AM  Result Value Ref Range Status   MRSA by PCR NEGATIVE NEGATIVE Final    Comment:        The GeneXpert MRSA Assay (FDA approved for NASAL specimens only), is one component of a comprehensive MRSA colonization surveillance program. It is not intended to diagnose MRSA infection nor to guide or monitor treatment for MRSA infections.   Respiratory virus panel (routine influenza)     Status: None   Collection Time: 06/01/14  9:01 PM  Result Value Ref Range Status   Source - RVPAN NASAL SWAB  Corrected   Respiratory Syncytial Virus A Negative Negative Final   Respiratory Syncytial Virus B Negative Negative Final   Influenza A Negative Negative Final   Influenza B Negative Negative Final   Parainfluenza 1 Negative Negative  Final   Parainfluenza 2 Negative Negative Final   Parainfluenza 3 Negative Negative Final   Metapneumovirus Negative Negative Final   Rhinovirus Negative Negative Final   Adenovirus Negative Negative Final    Comment: (NOTE) Performed At: [facility] [place], [place] [id number] Lindon Romp MD OV:[id number]   Culture, blood (routine x 2)     Status: None (Preliminary result)   Collection Time: 06/01/14 10:33 PM  Result Value Ref Range Status   Specimen Description BLOOD RIGHT HAND  Final   Special Requests BOTTLES DRAWN AEROBIC ONLY 5CC  Final   Culture   Final           BLOOD CULTURE RECEIVED NO GROWTH TO DATE CULTURE WILL BE HELD FOR 5 DAYS BEFORE ISSUING A FINAL NEGATIVE REPORT Performed at [facility]    Report Status PENDING  Incomplete  Culture, blood (routine x 2)     Status: None (Preliminary result)   Collection Time: 06/01/14 10:45 PM  Result Value  Ref Range Status   Specimen Description BLOOD LEFT HAND  Final   Special Requests BOTTLES DRAWN AEROBIC ONLY 4CC  Final   Culture   Final           BLOOD CULTURE RECEIVED NO GROWTH TO DATE CULTURE WILL BE HELD FOR 5 DAYS BEFORE ISSUING A FINAL NEGATIVE REPORT Performed at [facility]    Report Status PENDING  Incomplete     Studies:  Recent x-ray studies have been reviewed in detail by the Attending Physician  Scheduled Meds:  Scheduled Meds: . allopurinol  100 mg Oral BID  . antiseptic oral rinse  7 mL Mouth Rinse BID  . clopidogrel  75 mg Oral Daily  . furosemide  160 mg Oral TID  . heparin  5,000 Units Subcutaneous 3 times per day  . hydrALAZINE  25 mg Oral BID  . insulin aspart  0-9 Units Subcutaneous TID WC  . isosorbide mononitrate  15 mg Oral Daily  . oseltamivir  30 mg Oral Daily  . predniSONE  40 mg Oral Q breakfast  . pregabalin  75 mg Oral BID  . psyllium  1 packet Oral QHS  . sertraline  50 mg Oral QHS  . sodium chloride  3 mL Intravenous Q12H    Time spent on care of this patient: 40 mins   Allie Bossier [facility]  [facility] Office  [phone] Pager - [phone]  On-Call/Text Page:      [email or web address]      password [email or web address]  If 7PM-7AM, please contact night-coverage [email or web address] Password [email or web address] 06/04/2014, 4:05 PM   LOS: 4 days   Care during the described time interval was provided by me .  I have reviewed this patient's available data, including medical history, events of note, physical examination, radiology studies and test results as part of my evaluation  Dia Crawford, MD [phone] Pager

## 2014-06-04 NOTE — Progress Notes (Signed)
Assessment/Plan: 1 CKD4 with AKI worsening caused by hemodynamic change, ie low bp. Vol xs, proteinuria, mild acidemia. 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 Scarring on U/S Richardson/w some component of HTn or vasc dz. Need to R/O AIN with rash, and fevers. Also eval for secondary complications. Very hgh liklihood will progress. Need to eval proteinuria 2 CHF/Vol overload Plan : Supportive therapy, I explained to wife that renal function will likely worsen before it gets better. Diuretics on hold per cardiology.  Subjective: Interval History: none.  Objective: Vital signs in last 24 hours: Temp:  [98 F (36.7 Richardson)-100.2 F (37.9 Richardson)] 98 F (36.7 Richardson) (02/26 0645) Pulse Rate:  [86-106] 86 (02/26 0800) Resp:  [14-25] 20 (02/26 0800) BP: (91-158)/(45-81) 158/49 mmHg (02/26 0800) SpO2:  [93 %-100 %] 100 % (02/26 0800) FiO2 (%):  [40 %] 40 % (02/25 2255) Weight:  [108.9 kg (240 lb 1.3 oz)] 108.9 kg (240 lb 1.3 oz) (02/26 0500) Weight change: 1.1 kg (2 lb 6.8 oz)  Intake/Output from previous day: 02/25 0701 - 02/26 0700 In: 655 [P.O.:580; I.V.:75] Out: 640 [Urine:640] Intake/Output this shift: Total I/O In: -  Out: 125 [Urine:125]  General appearance: alert Resp: clear anteriorly Chest wall: no tenderness Cardio: regular rate and rhythm, S1, S2 normal, no murmur, click, rub or gallop Extremities: edema 1-2+  Lab Results:  Recent Labs  06/03/14 2100 06/04/14 0327  WBC 8.1 9.3  HGB 10.1* 10.2*  HCT 29.9* 30.7*  PLT 174 191   BMET:  Recent Labs  06/03/14 2100 06/04/14 0327  NA 131* 134*  K 4.0 4.3  CL 98 97  CO2 23 22  GLUCOSE 161* 191*  BUN 59* 65*  CREATININE 4.71* 5.01*  CALCIUM 7.3* 7.5*   No results for input(s): PTH in the last 72 hours. Iron Studies: No results for input(s): IRON, TIBC, TRANSFERRIN, FERRITIN in the last 72 hours. Studies/Results: Nm Myocar Multi W/spect W/wall Motion / Ef  06/02/2014   CLINICAL DATA:  Darrell Richardson is a 74 year old with a  history of diabetes, CHF (EF unknown), CKD creatnine baseline 2.5, and CVA 2009-2010.  EXAM: MYOCARDIAL IMAGING WITH SPECT (PHARMACOLOGIC-STRESS)  GATED LEFT VENTRICULAR WALL MOTION STUDY  LEFT VENTRICULAR EJECTION FRACTION  TECHNIQUE: Intravenous infusion of Lexiscan was performed under the supervision of the Cardiology staff. At peak effect of the drug, 10 mCi Tc-43m sestamibi was injected intravenously and standard myocardial SPECT imaging was performed. Quantitative gated imaging was also performed to evaluate left ventricular wall motion, and estimate left ventricular ejection fraction.  COMPARISON:  None.  FINDINGS: Perfusion: There is a large and severe inferior and inferoseptal perfusion defect with minimal reversibility around the defect edges. This is consistent with a large infarction in the right coronary artery distribution with mild peri-infarct ischemia.  Wall Motion: Mild global hypokinesis with inferior wall akinesis. Moderate left ventricular dilation.  Left Ventricular Ejection Fraction: 34 %  End diastolic volume 433 ml  End systolic volume 295 ml  IMPRESSION: 1. Scar with mild peri-infarct ischemia consistent with infarction in the entire RCA distribution  2.  Inferior wall akinesis.  3. Left ventricular ejection fraction 34%  4. High-risk stress test findings*. Minimal reversible ischemia. Moderately severe ischemic cardiomyopathy  *2012 Appropriate Use Criteria for Coronary Revascularization Focused Update: J Am Coll Cardiol. 1884;16(6):063-016. http://[email or web address]?articleid=1201161   Electronically Signed   By: Sanda Klein   On: 06/02/2014 13:42   Dg Chest Port 1 View  06/03/2014   CLINICAL DATA:  Shortness of breath,  flu symptoms  EXAM: PORTABLE CHEST - 1 VIEW  COMPARISON:  Portable chest x-ray of May 31, 2014  FINDINGS: The lungs are adequately inflated. The interstitial markings are less congested today. The cardiac silhouette remains enlarged. The  pulmonary vascularity remains engorged but there is less cephalization of the vascular pattern. The retrocardiac region on the left is less dense.  IMPRESSION: Findings consistent with mild improvement in pulmonary interstitial edema secondary to CHF. When the patient can tolerate the procedure, a PA and lateral chest x-ray would be useful.   Electronically Signed   By: David  Martinique   On: 06/03/2014 07:30    Scheduled: . allopurinol  100 mg Oral BID  . antiseptic oral rinse  7 mL Mouth Rinse BID  . clopidogrel  75 mg Oral Daily  . furosemide  160 mg Oral TID  . heparin  5,000 Units Subcutaneous 3 times per day  . hydrALAZINE  25 mg Oral BID  . insulin aspart  0-9 Units Subcutaneous TID WC  . isosorbide mononitrate  15 mg Oral Daily  . oseltamivir  30 mg Oral Daily  . predniSONE  40 mg Oral Q breakfast  . pregabalin  75 mg Oral BID  . psyllium  1 packet Oral QHS  . sertraline  50 mg Oral QHS  . sodium chloride  3 mL Intravenous Q12H      LOS: 4 days   Darrell Richardson 06/04/2014,9:24 AM

## 2014-06-04 NOTE — Progress Notes (Signed)
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  Admitted with a/c HF, HTN and respiratory distress.  Tmax 100.2 . Abx stopped. Suspected viral source.  Viral panel negative. Renal function continue to worsen. Seen by Nephrology. Feels bad. Aches all over. Says it is his gout in all his joints.    Creatinine at [facility] 2.4 >2.9 > 3.5>3.92 > 5.1  Stress Test 06/02/14  1. Scar with mild peri-infarct ischemia consistent with infarction in the entire RCA distribution 2. Inferior wall akinesis. 3. Left ventricular ejection fraction 34% 4. High-risk stress test findings*. Minimal reversible ischemia. Moderately severe ischemic cardiomyopathy  Renal Ultrasound- No hydronephrosis. Small bilateral cysts. L>R ECHO EF 40-45% Akinesis inferior and inferolateral wall. Grade II DD.  Lexiscan Myvoiew 06/02/14: There is a large and severe inferior and inferoseptal perfusion defect with minimal reversibility around the defect edges. This is consistent with a large infarction in the right coronary artery distribution with mild peri-infarct ischemia.  Objective:   Weight Range:  Vital Signs:   Temp:  [98 F (36.7 C)-100.2 F (37.9 C)] 98 F (36.7 C) (02/26 0645) Pulse Rate:  [87-106] 87 (02/26 0400) Resp:  [14-25] 14 (02/26 0400) BP: (91-132)/(45-81) 91/66 mmHg (02/26 0400) SpO2:  [93 %-99 %] 94 % (02/26 0400) FiO2 (%):  [40 %] 40 % (02/25 2255) Weight:  [108.9 kg (240 lb 1.3 oz)] 108.9 kg (240 lb 1.3 oz) (02/26 0500) Last BM Date: 06/03/14  Weight change: Filed Weights   06/02/14 0335 06/03/14 0500 06/04/14 0500  Weight: 110.3 kg (243 lb 2.7 oz) 107.8 kg (237 lb 10.5 oz) 108.9 kg (240 lb 1.3 oz)    Intake/Output:   Intake/Output Summary (Last 24 hours) at 06/04/14 0737 Last data filed at 06/04/14 0300  Gross per 24 hour  Intake    655 ml  Output    640 ml   Net     15 ml     Physical Exam: General: Elderly male lying flat in bed.No dyspnea HEENT: normal Neck: supple. JVP hard to see. Carotids 2+ bilat; no bruits. No lymphadenopathy or thryomegaly appreciated. Cor: PMI nondisplaced. Regular rate & rhythm. No rubs, gallops or murmurs. No S3.  Lungs: crackles in bases on 4 liters [place] Abdomen: soft, nontender, nondistended. No hepatosplenomegaly. No bruits or masses. Good bowel sounds. Extremities no cyanosis, clubbing, rash, R and LLE 1+ edema. DPs trace bialterally Neuro: alert & orientedx3, cranial nerves grossly intact. moves all 4 extremities w/o difficulty. Affect pleasant  Telemetry:  SR   Labs: Basic Metabolic Panel:  Recent Labs Lab 06/01/14 0315 06/02/14 0310 06/03/14 0314 06/03/14 2100 06/04/14 0327  NA 136 132* 132* 131* 134*  K 4.7 4.4 4.0 4.0 4.3  CL 100 100 98 98 97  CO2 28 26 22 23 22   GLUCOSE 154* 190* 192* 161* 191*  BUN 38* 46* 51* 59* 65*  CREATININE 2.95* 3.49* 3.92* 4.71* 5.01*  CALCIUM 7.8* 7.3* 7.3* 7.3* 7.5*  MG  --   --   --   --  2.1  PHOS  --   --   --  3.9  --     Liver Function Tests:  Recent Labs Lab 06/03/14 0314 06/03/14 2100 06/04/14 0327  AST 21 27 28   ALT 13 17 21   ALKPHOS 74 75 79  BILITOT 1.0 0.6 0.8  PROT 5.6* 5.5*  5.8*  ALBUMIN 2.1* 1.9* 1.9*   No results for input(s): LIPASE, AMYLASE in the last 168 hours. No results for input(s): AMMONIA in the last 168 hours.  CBC:  Recent Labs Lab 06/01/14 0315 06/02/14 0310 06/03/14 0314 06/03/14 2100 06/04/14 0327  WBC 5.0 6.7 8.9 8.1 9.3  NEUTROABS  --   --   --  6.3 7.5  HGB 10.5* 10.9* 10.5* 10.1* 10.2*  HCT 31.2* 32.3* 32.1* 29.9* 30.7*  MCV 86.4 87.1 87.0 86.2 86.7  PLT 171 170 189 174 191    Cardiac Enzymes:  Recent Labs Lab 06/03/14 2100  CKTOTAL 205    BNP: BNP (last 3 results) No results for input(s): BNP in the last 8760 hours.  ProBNP (last 3 results) No results for input(s): PROBNP in the last  8760 hours.    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  EXAM: MYOCARDIAL IMAGING WITH SPECT (PHARMACOLOGIC-STRESS)  GATED LEFT VENTRICULAR WALL MOTION STUDY  LEFT VENTRICULAR EJECTION FRACTION  TECHNIQUE: Intravenous infusion of Lexiscan was performed under the supervision of the Cardiology staff. At peak effect of the drug, 10 mCi Tc-51m sestamibi was injected intravenously and standard myocardial SPECT imaging was performed. Quantitative gated imaging was also performed to evaluate left ventricular wall motion, and estimate left ventricular ejection fraction.  COMPARISON:  None.  FINDINGS: Perfusion: There is a large and severe inferior and inferoseptal perfusion defect with minimal reversibility around the defect edges. This is consistent with a large infarction in the right coronary artery distribution with mild peri-infarct ischemia.  Wall Motion: Mild global hypokinesis with inferior wall akinesis. Moderate left ventricular dilation.  Left Ventricular Ejection Fraction: 34 %  End diastolic volume 725 ml  End systolic volume 366 ml  IMPRESSION: 1. Scar with mild peri-infarct ischemia consistent with infarction in the entire RCA distribution  2.  Inferior wall akinesis.  3. Left ventricular ejection fraction 34%  4. High-risk stress test findings*. Minimal reversible ischemia. Moderately severe ischemic cardiomyopathy  *2012 Appropriate Use Criteria for Coronary Revascularization Focused Update: J Am Coll Cardiol. 4403;47(4):259-563. http://[email or web address]?articleid=[email or web address]   Electronically Signed   By: Sanda Klein   On: 06/02/2014 13:42   Dg Chest Port 1 View  06/03/2014   CLINICAL DATA:  Shortness of breath, flu symptoms  EXAM: PORTABLE CHEST - 1 VIEW  COMPARISON:  Portable chest x-ray of May 31, 2014  FINDINGS: The lungs are adequately inflated. The interstitial markings are less congested today. The cardiac silhouette remains enlarged. The pulmonary vascularity remains engorged but there is less cephalization of the vascular pattern. The retrocardiac region on the left is less dense.  IMPRESSION: Findings consistent with mild improvement in pulmonary interstitial edema secondary to CHF. When the patient can tolerate the procedure, a PA and lateral chest x-ray would be useful.   Electronically Signed   By: David  Martinique   On: 06/03/2014 07:30     Medications:     Scheduled Medications: . allopurinol  100 mg Oral BID  . antiseptic oral rinse  7 mL Mouth Rinse BID  . clopidogrel  75 mg Oral Daily  . furosemide  160 mg Oral TID  . heparin  5,000 Units Subcutaneous 3 times per day  . hydrALAZINE  25 mg Oral BID  . insulin aspart  0-9  Units Subcutaneous TID WC  . isosorbide mononitrate  15 mg Oral Daily  . oseltamivir  30 mg Oral Daily  . pregabalin  75 mg Oral BID  . psyllium  1 packet Oral QHS  . sertraline  50 mg Oral QHS  . sodium chloride  3 mL Intravenous Q12H    Infusions:    PRN Medications: acetaminophen **OR** acetaminophen, diphenhydrAMINE, HYDROcodone-acetaminophen, hydrocortisone cream, morphine injection, ondansetron (ZOFRAN) IV, oxymetazoline   Assessment:   1. A/C Systolic HF 2. Acute Respiratory Failure- on Bipap now weaned to nasal cannula.  3. Hypertensive crisis 4. DM 5. Acute on CKD, stage IV - Creatinine baseline 2.5 6. 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 Chest Pain 1 week ago 8. Fever - ? Acute bronchitis vs flu 9. Acute gout   Plan/Discussion:    Continues with low grade fevers. Renal function worsening being seen by Nephrology. Now with what seems to be acute polyarticular gout.   From a HF standpoint he is stable. Appears fluid replete but not markedly overloaded. Would continue to hold diuretics. Doubt he is low output. Continue medical management  of CAD.   He has allergy to IV hydrocortisone. Will try prednisone carefully for gout. Check uric acid.   Will follow peripherally. Please call us as needed.   Darrell Richardson 7:37 AM

## 2014-06-05 ENCOUNTER — Inpatient Hospital Stay (HOSPITAL_COMMUNITY): Payer: Medicare Other

## 2014-06-05 DIAGNOSIS — M10041 Idiopathic gout, right hand: Secondary | ICD-10-CM | POA: Diagnosis present

## 2014-06-05 DIAGNOSIS — M1000 Idiopathic gout, unspecified site: Secondary | ICD-10-CM | POA: Diagnosis present

## 2014-06-05 DIAGNOSIS — N189 Chronic kidney disease, unspecified: Secondary | ICD-10-CM

## 2014-06-05 DIAGNOSIS — N179 Acute kidney failure, unspecified: Secondary | ICD-10-CM | POA: Diagnosis present

## 2014-06-05 DIAGNOSIS — O10019 Pre-existing essential hypertension complicating pregnancy, unspecified trimester: Secondary | ICD-10-CM | POA: Diagnosis present

## 2014-06-05 LAB — GLUCOSE, CAPILLARY
Glucose-Capillary: 162 mg/dL — ABNORMAL HIGH (ref 70–99)
Glucose-Capillary: 152 mg/dL — ABNORMAL HIGH (ref 70–99)
Glucose-Capillary: 189 mg/dL — ABNORMAL HIGH (ref 70–99)
Glucose-Capillary: 209 mg/dL — ABNORMAL HIGH (ref 70–99)

## 2014-06-05 LAB — COMPREHENSIVE METABOLIC PANEL
ALT: 27 U/L (ref 0–53)
AST: 31 U/L (ref 0–37)
Albumin: 1.8 g/dL — ABNORMAL LOW (ref 3.5–5.2)
Alkaline Phosphatase: 81 U/L (ref 39–117)
Anion gap: 13 (ref 5–15)
BUN: 84 mg/dL — ABNORMAL HIGH (ref 6–23)
CALCIUM: 7.3 mg/dL — AB (ref 8.4–10.5)
CO2: 23 mmol/L (ref 19–32)
CREATININE: 5.56 mg/dL — AB (ref 0.50–1.35)
Chloride: 96 mmol/L (ref 96–112)
GFR, EST AFRICAN AMERICAN: 11 mL/min — AB (ref >90)
GFR, EST NON AFRICAN AMERICAN: 9 mL/min — AB (ref >90)
Glucose, Bld: 184 mg/dL — ABNORMAL HIGH (ref 70–99)
POTASSIUM: 4.7 mmol/L (ref 3.5–5.1)
SODIUM: 132 mmol/L — AB (ref 135–145)
Total Bilirubin: 0.4 mg/dL (ref 0.3–1.2)
Total Protein: 5.6 g/dL — ABNORMAL LOW (ref 6.0–8.3)

## 2014-06-05 LAB — CBC WITH DIFFERENTIAL/PLATELET
BASOS ABS: 0 10*3/uL (ref 0.0–0.1)
BASOS PCT: 0 % (ref 0–1)
EOS PCT: 0 % (ref 0–5)
Eosinophils Absolute: 0 10*3/uL (ref 0.0–0.7)
HCT: 29.1 % — ABNORMAL LOW (ref 39.0–52.0)
Hemoglobin: 10 g/dL — ABNORMAL LOW (ref 13.0–17.0)
LYMPHS ABS: 0.4 10*3/uL — AB (ref 0.7–4.0)
Lymphocytes Relative: 5 % — ABNORMAL LOW (ref 12–46)
MCH: 29.2 pg (ref 26.0–34.0)
MCHC: 34.4 g/dL (ref 30.0–36.0)
MCV: 85.1 fL (ref 78.0–100.0)
Monocytes Absolute: 0.6 10*3/uL (ref 0.1–1.0)
Monocytes Relative: 8 % (ref 3–12)
NEUTROS ABS: 6.9 10*3/uL (ref 1.7–7.7)
NEUTROS PCT: 87 % — AB (ref 43–77)
PLATELETS: 204 10*3/uL (ref 150–400)
RBC: 3.42 MIL/uL — ABNORMAL LOW (ref 4.22–5.81)
RDW: 13.1 % (ref 11.5–15.5)
WBC: 8 10*3/uL (ref 4.0–10.5)

## 2014-06-05 LAB — ANTISTREPTOLYSIN O TITER: ASO: 44.4 [IU]/mL (ref 0.0–200.0)

## 2014-06-05 LAB — CREATININE CLEARANCE, URINE, 24 HOUR
CREATININE 24H UR: 649 mg/d — AB (ref 800–2000)
Collection Interval-CRCL: 24 hours
Creatinine Clearance: 8 mL/min — ABNORMAL LOW (ref 75–125)
Creatinine, Urine: 129.88 mg/dL
URINE TOTAL VOLUME-CRCL: 500 mL

## 2014-06-05 LAB — PROTEIN, URINE, 24 HOUR
Collection Interval-UPROT: 24 hours
Protein, 24H Urine: 2190 mg/d — ABNORMAL HIGH (ref 50–100)
Protein, Urine: 438 mg/dL
Urine Total Volume-UPROT: 500 mL

## 2014-06-05 LAB — PARATHYROID HORMONE, INTACT (NO CA): PTH: 176 pg/mL — ABNORMAL HIGH (ref 15–65)

## 2014-06-05 LAB — C3 COMPLEMENT: C3 Complement: 119 mg/dL (ref 82–167)

## 2014-06-05 LAB — C4 COMPLEMENT: Complement C4, Body Fluid: 30 mg/dL (ref 14–44)

## 2014-06-05 LAB — HEPATITIS B SURFACE ANTIBODY,QUALITATIVE: Hep B S Ab: NONREACTIVE

## 2014-06-05 LAB — MAGNESIUM: Magnesium: 2.3 mg/dL (ref 1.5–2.5)

## 2014-06-05 MED ORDER — TECHNETIUM TO 99M ALBUMIN AGGREGATED
6.0000 | Freq: Once | INTRAVENOUS | Status: AC | PRN
  Administered 2014-06-05: 6 via INTRAVENOUS

## 2014-06-05 MED ORDER — HYDRALAZINE HCL 10 MG PO TABS
10.0000 mg | ORAL_TABLET | Freq: Two times a day (BID) | ORAL | Status: DC
  Administered 2014-06-05 – 2014-06-06 (×2): 10 mg via ORAL
  Filled 2014-06-05 (×3): qty 1

## 2014-06-05 MED ORDER — TECHNETIUM TC 99M DIETHYLENETRIAME-PENTAACETIC ACID: 40.0000 | Freq: Once | INTRAVENOUS | Status: AC | PRN

## 2014-06-05 NOTE — Progress Notes (Signed)
Advanced Heart Failure Rounding Note   Subjective:    Says he feels a bit better. Slept well for first time. Afebrile. Gout feels some better with prednisone but still hurts all over. Cr worse. Now 5.5. Still making some urine. Weight stable.   Objective:   Weight Range:  Vital Signs:   Temp:  [97.3 F (36.3 C)-98.6 F (37 C)] 97.3 F (36.3 C) (02/27 0733) Pulse Rate:  [75-82] 77 (02/27 0800) Resp:  [13-18] 17 (02/27 0800) BP: (109-144)/(38-69) 126/69 mmHg (02/27 0800) SpO2:  [92 %-100 %] 100 % (02/27 0800) Weight:  [108.7 kg (239 lb 10.2 oz)] 108.7 kg (239 lb 10.2 oz) (02/27 0330) Last BM Date: 06/04/14  Weight change: Filed Weights   06/03/14 0500 06/04/14 0500 06/05/14 0330  Weight: 107.8 kg (237 lb 10.5 oz) 108.9 kg (240 lb 1.3 oz) 108.7 kg (239 lb 10.2 oz)    Intake/Output:   Intake/Output Summary (Last 24 hours) at 06/05/14 1127 Last data filed at 06/05/14 9758  Gross per 24 hour  Intake   2000 ml  Output    745 ml  Net   1255 ml     Physical Exam: General: Elderly male sitting in chair .No dyspnea HEENT: normal Neck: supple. JVP hard to see.  Lookg 6-8 Carotids 2+ bilat; no bruits. No lymphadenopathy or thryomegaly appreciated. Cor: PMI nondisplaced. Regular rate & rhythm. No rubs, gallops or murmurs. No S3.  Lungs: crackles in bases on 4 liters [place] Abdomen: soft, nontender, nondistended. No hepatosplenomegaly. No bruits or masses. Good bowel sounds. Extremities no cyanosis, clubbing, rash, R and LLE tr edema. DPs trace bialterally Neuro: alert & orientedx3, cranial nerves grossly intact. moves all 4 extremities w/o difficulty. Affect pleasant  Telemetry:  SR   Labs: Basic Metabolic Panel:  Recent Labs Lab 06/02/14 0310 06/03/14 0314 06/03/14 2100 06/04/14 0327 06/05/14 0300  NA 132* 132* 131* 134* 132*  K 4.4 4.0 4.0 4.3 4.7  CL 100 98 98 97 96  CO2 _0 GLUCOSE 190* 192* 161* 191* 184*  BUN 46* 51* 59* 65* 84*  CREATININE 3.49*  3.92* 4.71* 5.01* 5.56*  CALCIUM 7.3* 7.3* 7.3* 7.5* 7.3*  MG  --   --   --  2.1 2.3  PHOS  --   --  3.9  --   --     Liver Function Tests:  Recent Labs Lab 06/03/14 0314 06/03/14 2100 06/04/14 0327 06/05/14 0300  AST _1 ALT _2 ALKPHOS 74 75 79 81  BILITOT 1.0 0.6 0.8 0.4  PROT 5.6* 5.5* 5.8* 5.6*  ALBUMIN 2.1* 1.9* 1.9* 1.8*   No results for input(s): LIPASE, AMYLASE in the last 168 hours. No results for input(s): AMMONIA in the last 168 hours.  CBC:  Recent Labs Lab 06/02/14 0310 06/03/14 0314 06/03/14 2100 06/04/14 0327 06/05/14 0300  WBC 6.7 8.9 8.1 9.3 8.0  NEUTROABS  --   --  6.3 7.5 6.9  HGB 10.9* 10.5* 10.1* 10.2* 10.0*  HCT 32.3* 32.1* 29.9* 30.7* 29.1*  MCV 87.1 87.0 86.2 86.7 85.1  PLT 170 189 174 191 204    Cardiac Enzymes:  Recent Labs Lab 06/03/14 2100  CKTOTAL 205    BNP: BNP (last 3 results) No results for input(s): BNP in the last 8760 hours.  ProBNP (last 3 results) No results for input(s): PROBNP in the last 8760 hours.    Other results:   Imaging: No  results found.   Medications:     Scheduled Medications: . allopurinol  100 mg Oral BID  . antiseptic oral rinse  7 mL Mouth Rinse BID  . clopidogrel  75 mg Oral Daily  . heparin  5,000 Units Subcutaneous 3 times per day  . hydrALAZINE  25 mg Oral BID  . insulin aspart  0-9 Units Subcutaneous TID WC  . isosorbide mononitrate  15 mg Oral Daily  . oseltamivir  30 mg Oral Daily  . predniSONE  40 mg Oral Q breakfast  . pregabalin  75 mg Oral BID  . psyllium  1 packet Oral QHS  . sertraline  50 mg Oral QHS  . sodium chloride  3 mL Intravenous Q12H    Infusions:    PRN Medications: acetaminophen **OR** acetaminophen, diphenhydrAMINE, HYDROcodone-acetaminophen, hydrocortisone cream, morphine injection, ondansetron (ZOFRAN) IV, oxymetazoline   Assessment:   1. A/C Systolic HF 2. Acute Respiratory Failure- on Bipap now weaned to nasal cannula.   3. Hypertensive crisis 4. DM 5. Acute on CKD, stage IV - Creatinine baseline 2.5 6. 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 Chest Pain 1 week ago 8. Fever - ? Acute bronchitis vs flu 9. Acute gout - uric acid 8.3   Plan/Discussion:    Seems to be improving slowly. Though still weak and muscles sore. Suspect had viral illness. CK only 202. Will check ESR.   Creatine getting worse but seems to be plateauing. No uremia.   From a HF standpoint he is stable. Appears fluid replete but not markedly overloaded. (renal feels he is overloaded). Weight is stable Would continue to hold diuretics unless Renal feels otherwise. Doubt he is low output. Continue medical management of CAD.   Continue prednisone for gout.  Will follow peripherally. Will defer diuretic management to Renal.  Please call us as needed.   Ahmar Pickrell,MD 11:27 AM

## 2014-06-05 NOTE — Evaluation (Signed)
Physical Therapy Evaluation Patient Details Name: Darrell Richardson MRN: [id number] DOB: 07-30-1940 Today's Date: 06/05/2014   History of Present Illness  74 y.o. M Hx diabetes type II uncontrolled, CHF, HTN, CKD, and CVA w/ residual Lt Hemiparesis who presented to [facility] complaining of shortness of breath. He was found to be in acute respiratory failure secondary to an acute CHF exacerbation, placed on BiPAP and sent to [facility] for further evaluation.  Clinical Impression  Patient demonstrates deficits in functional mobility as indicated below. WIll need continued skilled PT to address deficits and maximize functional recovery. Will see as indicated and progress as tolerated. At this time pain (gout) severely limited progression, if pain becomes controlled anticipate patient will do well with intense CIR, if pain does not improve may need to consider less intense setting such as SNF. Will follow closely.    Follow Up Recommendations CIR    Equipment Recommendations  Other (comment) (tbd)    Recommendations for Other Services Rehab consult     Precautions / Restrictions Precautions Precautions: Fall Precaution Comments: significant pain ZM:OQHU flareup Restrictions Weight Bearing Restrictions: No      Mobility  Bed Mobility Overal bed mobility: Needs Assistance;+2 for physical assistance Bed Mobility: Supine to Sit     Supine to sit: Max assist;+2 for physical assistance     General bed mobility comments: ised pillow to support BLE (feet) during transitional movements secondary to pain gfrom gout, increased physical assist to elevate trunk to EOB due to stiffness and pain.  Transfers Overall transfer level: Needs assistance Equipment used: 2 person hand held assist (face to face with chuck pad and gait belt) Transfers: Sit to/from [employer] Transfers Sit to Stand: Max assist;+2 physical assistance Stand pivot transfers: Total assist;+2  physical assistance       General transfer comment: Patient with poor physical strenghth and inability to bear weight through BLEs secondary to pain.  Ambulation/Gait             General Gait Details: unable to perform  Stairs            Wheelchair Mobility    Modified Rankin (Stroke Patients Only)       Balance Overall balance assessment: Needs assistance Sitting-balance support: No upper extremity supported;Feet supported Sitting balance-Leahy Scale: Poor Sitting balance - Comments: initally requiring assist for posterior support, progressed to EOB self supported with ocassional cues for midline positioning Postural control: Posterior lean Standing balance support: During functional activity Standing balance-Leahy Scale: Zero Standing balance comment: Could not maintain static standing                             Pertinent Vitals/Pain Pain Assessment: 0-10 Pain Score: 6  (10/10 with movement) Pain Location: BLE feet as well as hands (gout) Pain Descriptors / Indicators: Sharp;Stabbing Pain Intervention(s): Limited activity within patient's tolerance;Repositioned;Relaxation;RN gave pain meds during session    [place] expects to be discharged to:: Private residence Living Arrangements: Spouse/significant other Available Help at Discharge: Family Type of Home: House Home Access: Ramped entrance     [place]: One [place]: Environmental consultant - 2 wheels;Walker - 4 wheels;Cane - quad;Cane - single point;Shower seat;Grab bars - toilet      Prior Function Level of Independence: Independent         Comments: occassional use of cane/walker if bad day     Hand Dominance   Dominant Hand:  Right    Extremity/Trunk Assessment               Lower Extremity Assessment: Generalized weakness;LLE deficits/detail   LLE Deficits / Details: modest residual stroke deficits, 3-/5     Communication      Cognition  Arousal/Alertness: Awake/alert Behavior During Therapy: WFL for tasks assessed/performed;Anxious Overall Cognitive Status: Within Functional Limits for tasks assessed                      General Comments      Exercises        Assessment/Plan    PT Assessment Patient needs continued PT services  PT Diagnosis Difficulty walking;Abnormality of gait;Generalized weakness;Acute pain   PT Problem List Decreased strength;Decreased activity tolerance;Decreased balance;Decreased mobility;Decreased coordination;Cardiopulmonary status limiting activity;Obesity;Pain  PT Treatment Interventions DME instruction;Gait training;Functional mobility training;Therapeutic activities;Therapeutic exercise;Balance training;Patient/family education   PT Goals (Current goals can be found in the Care Plan section) Acute Rehab PT Goals Patient Stated Goal: to get home PT Goal Formulation: With patient/family Time For Goal Achievement: 06/19/14 Potential to Achieve Goals: Good    Frequency Min 3X/week   Barriers to discharge        Co-evaluation               End of Session Equipment Utilized During Treatment: Gait belt;Oxygen Activity Tolerance: Patient limited by fatigue;Patient limited by pain Patient left: in chair;with call bell/phone within reach;with family/visitor present Nurse Communication: Mobility status;Need for lift equipment;Patient requests pain meds         Time: 5364-6803 PT Time Calculation (min) (ACUTE ONLY): 24 min   Charges:   PT Evaluation $Initial PT Evaluation Tier I: 1 Procedure PT Treatments $Therapeutic Activity: 8-22 mins   PT G CodesDuncan Dull 15-Jun-2014, 11:55 AM Alben Deeds, PT DPT  [phone]

## 2014-06-05 NOTE — Progress Notes (Signed)
Assessment/Plan: 1 CKD4 with AKI worsening caused by hemodynamic change, ie low bp. 2 CHF/Vol overload, improved Plan : Supportive therapy, Diuretics on hold per cardiology. Labs pending  Subjective: Interval History: Comfortable  Objective: Vital signs in last 24 hours: Temp:  [97.3 F (36.3 C)-98.6 F (37 C)] 97.3 F (36.3 C) (02/27 0733) Pulse Rate:  [75-86] 77 (02/27 0733) Resp:  [13-18] 14 (02/27 0733) BP: (109-145)/(38-65) 118/62 mmHg (02/27 0733) SpO2:  [92 %-100 %] 100 % (02/27 0733) Weight:  [108.7 kg (239 lb 10.2 oz)] 108.7 kg (239 lb 10.2 oz) (02/27 0330) Weight change: -0.2 kg (-7.1 oz)  Intake/Output from previous day: 02/26 0701 - 02/27 0700 In: 2000 [P.O.:2000] Out: 615 [Urine:615] Intake/Output this shift:    General appearance: alert and cooperative Resp: clear to auscultation bilaterally, anteriorly Chest wall: no tenderness Cardio: regular rate and rhythm, S1, S2 normal, no murmur, click, rub or gallop Extremities: extremities normal, atraumatic, no cyanosis or edema  Lab Results:  Recent Labs  06/04/14 0327 06/05/14 0300  WBC 9.3 8.0  HGB 10.2* 10.0*  HCT 30.7* 29.1*  PLT 191 204   BMET:  Recent Labs  06/04/14 0327 06/05/14 0300  NA 134* 132*  K 4.3 4.7  CL 97 96  CO2 22 23  GLUCOSE 191* 184*  BUN 65* 84*  CREATININE 5.01* 5.56*  CALCIUM 7.5* 7.3*    Recent Labs  06/03/14 2100  PTH 176*   Iron Studies: No results for input(s): IRON, TIBC, TRANSFERRIN, FERRITIN in the last 72 hours. Studies/Results: No results found.  Scheduled: . allopurinol  100 mg Oral BID  . antiseptic oral rinse  7 mL Mouth Rinse BID  . clopidogrel  75 mg Oral Daily  . heparin  5,000 Units Subcutaneous 3 times per day  . hydrALAZINE  25 mg Oral BID  . insulin aspart  0-9 Units Subcutaneous TID WC  . isosorbide mononitrate  15 mg Oral Daily  . oseltamivir  30 mg Oral Daily  . predniSONE  40 mg Oral Q breakfast  . pregabalin  75 mg Oral BID  .  psyllium  1 packet Oral QHS  . sertraline  50 mg Oral QHS  . sodium chloride  3 mL Intravenous Q12H     LOS: 5 days   Bailea Beed C 06/05/2014,8:50 AM

## 2014-06-05 NOTE — Progress Notes (Signed)
[place] TEAM 1 - Stepdown/ICU TEAM Progress Note  Darrell Richardson VHQ:[id number] DOB: [birth year] DOA: 05/31/2014 PCP: [name], MD  Admit HPI / Brief Narrative: Darrell Richardson is a 74 y.o. WM PMHx  diabetes type II uncontrolled, CHF, HTN, CKD and CVA Residual Lt Hemiparesis came in to [facility] complaining about shortness of breath. They don't have beds and stepdown so they transferred him to [facility] for further medical evaluation. This gentleman gets his cardiac care through her clinic, since last night he was complaining about shortness of breath, orthopnea, increased lower extremity edema he couldn't sleep very well so he came into the hospital in a.m. Was found to be in acute respiratory failure secondary to acute CHF exacerbation, placed on BiPAP and sent to [facility] for further evaluation. In the ED troponin was negative, creatinine 2.44, hemoglobin 13.3. BNP is 7363 (reference value 0-125 pg/mL). Patient was given 1 dose of Lasix and sent for further evaluation.   HPI/Subjective: 2/27 A/O x 4, joking with staff and family. However patient upset secondary to the way the overhead lift was used to position him in a wheelchair. Patient and wife have filed complaint with charge nurse that he was moved an inappropriate manner. Hands and feet less swelling today.    Assessment/Plan:  Cardiomyopathy/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 CHF -Patient presented with SOB, orthopnea, lower extremity edema and BNP of 7300. -Continue BiPAP.PRN -Strict In and Out; Since Admission + 1.9 L  - Daily Am Weight;Admission wt= 108.3 Kg             2/27 bed Standing wt= 108.7 Kg -Losartan held because of worsening creatinine.  Acute on chronic respiratory failure with hypoxia/bronchitis? - Positive fever on Admission, negative leukocytosis, bands or left shift. Continues spike fevers.  -Continued SOB. Patient not on O2 at home. -VQ scan;  normal  Hpertension -Pt still  Hypotensive -Consult nephrology -Decrease Hydralazine to 10 mg BID  -Continue Imdur 15 mg daily -labs ordered by nephrology pending  Diabetes mellitus type 2 uncontrolled -2/22 hemoglobin A1c = 7.5 . -Continue sensitive SSI .   Acute on chronic renal failure  (baseline 2.5 and 2.8),  -2/27 Currently Cr= 5.56, continue to increase consult nephrology  History of CVA -Residual left hemiparesis. Per wife did not attend inpatient rehabilitation  -PT/OT evaluate for CIR  vs SNF -This is stable, patient is on Plavix, continued.  Gout -Uric acid= 8.3, goal <6  -Continue prednisone 40 mg daily -Secondary to patient's worsening renal function will DC allopurinol, in addition can exacerbate an acute flare.     Code Status: FULL Family Communication:  family present at time of exam Disposition Plan: Per cardiology and nephrology    Consultants: Dr.Daniel R Bensimhon (cardiology heart failure team) Dr.James L Deterding (nephrology)    Procedure/Significant Events: 2/22 echocardiogram;- Left ventricle: mild LVH. -LVEF= 40% to 45%. akinesis of inferolateral and inferior myocardium.  -(grade 2 diastolic dysfunction).  - Left atrium: severely dilated. - Pericardium, extracardiac: A small pericardial effusion was identified.  2/22 PCXR;Congestive heart failure. 2/22 renal ultrasound;Marland Kitchen No hydronephrosis.- Small bilateral renal cysts. Borderline increased parenchymal echogenicity right kidney. -Bilateral, Lt >>Rt  renal cortical thinning. 2/27 VQ scan; normal    Culture 2/22 MRSA by PCR negative 2/23 sputum pending 2/23 respiratory virus panel pending   Antibiotics: Azithromycin 2/23>> stopped 2/24 Vancomycin 2/24>> stopped 2/24 Tamiflu 2/24>>   DVT prophylaxis: Subcutaneous heparin   Devices NA   LINES / TUBES:  NA    Continuous Infusions:    Objective: VITAL SIGNS: Temp: 97.8 F (36.6 C) (02/27 1500) Temp Source: Oral  (02/27 1500) BP: 119/58 mmHg (02/27 1600) Pulse Rate: 75 (02/27 1600) SPO2; FIO2:   Intake/Output Summary (Last 24 hours) at 06/05/14 1709 Last data filed at 06/05/14 1501  Gross per 24 hour  Intake   2000 ml  Output    745 ml  Net   1255 ml     Exam: General A/O x 4, joking with staff and family, No acute respiratory distress Lungs: Clear to auscultation bilaterally without wheezes or crackles Cardiovascular:  Regular rhythm and rate without murmur gallop or rub normal S1 and S2 Abdomen: Obese, Nontender, nondistended, soft, bowel sounds positive, no rebound, no ascites, no appreciable mass Extremities: No significant cyanosis, clubbing, Positive swellling bilat hands (decreased from 2/26), erythema Rt>> Lt negative decreased, bilateral lower extremities edema decreased, erythematous rash bilateral calves resolved. Still tenderness in feet  Neurologic; within normal limit   Data Reviewed: Basic Metabolic Panel:  Recent Labs Lab 06/02/14 0310 06/03/14 0314 06/03/14 2100 06/04/14 0327 06/05/14 0300  NA 132* 132* 131* 134* 132*  K 4.4 4.0 4.0 4.3 4.7  CL 100 98 98 97 96  CO2 26 22 23 22 23   GLUCOSE 190* 192* 161* 191* 184*  BUN 46* 51* 59* 65* 84*  CREATININE 3.49* 3.92* 4.71* 5.01* 5.56*  CALCIUM 7.3* 7.3* 7.3* 7.5* 7.3*  MG  --   --   --  2.1 2.3  PHOS  --   --  3.9  --   --    Liver Function Tests:  Recent Labs Lab 06/03/14 0314 06/03/14 2100 06/04/14 0327 06/05/14 0300  AST 21 27 28 31   ALT 13 17 21 27   ALKPHOS 74 75 79 81  BILITOT 1.0 0.6 0.8 0.4  PROT 5.6* 5.5* 5.8* 5.6*  ALBUMIN 2.1* 1.9* 1.9* 1.8*   No results for input(s): LIPASE, AMYLASE in the last 168 hours. No results for input(s): AMMONIA in the last 168 hours. CBC:  Recent Labs Lab 06/02/14 0310 06/03/14 0314 06/03/14 2100 06/04/14 0327 06/05/14 0300  WBC 6.7 8.9 8.1 9.3 8.0  NEUTROABS  --   --  6.3 7.5 6.9  HGB 10.9* 10.5* 10.1* 10.2* 10.0*  HCT 32.3* 32.1* 29.9* 30.7* 29.1*   MCV 87.1 87.0 86.2 86.7 85.1  PLT 170 189 174 191 204   Cardiac Enzymes:  Recent Labs Lab 06/03/14 2100  CKTOTAL 205   BNP (last 3 results) No results for input(s): BNP in the last 8760 hours.  ProBNP (last 3 results) No results for input(s): PROBNP in the last 8760 hours.  CBG:  Recent Labs Lab 06/04/14 1651 06/04/14 2217 06/05/14 0733 06/05/14 1127 06/05/14 1609  GLUCAP 220* 201* 162* 152* 189*    Recent Results (from the past 240 hour(s))  MRSA PCR Screening     Status: None   Collection Time: 05/31/14 10:55 AM  Result Value Ref Range Status   MRSA by PCR NEGATIVE NEGATIVE Final    Comment:        The GeneXpert MRSA Assay (FDA approved for NASAL specimens only), is one component of a comprehensive MRSA colonization surveillance program. It is not intended to diagnose MRSA infection nor to guide or monitor treatment for MRSA infections.   Respiratory virus panel (routine influenza)     Status: None   Collection Time: 06/01/14  9:01 PM  Result Value Ref Range Status   Source -  RVPAN NASAL SWAB  Corrected   Respiratory Syncytial Virus A Negative Negative Final   Respiratory Syncytial Virus B Negative Negative Final   Influenza A Negative Negative Final   Influenza B Negative Negative Final   Parainfluenza 1 Negative Negative Final   Parainfluenza 2 Negative Negative Final   Parainfluenza 3 Negative Negative Final   Metapneumovirus Negative Negative Final   Rhinovirus Negative Negative Final   Adenovirus Negative Negative Final    Comment: (NOTE) Performed At: [facility] [place], [place] [id number] Lindon Romp MD XN:[id number]   Culture, blood (routine x 2)     Status: None (Preliminary result)   Collection Time: 06/01/14 10:33 PM  Result Value Ref Range Status   Specimen Description BLOOD RIGHT HAND  Final   Special Requests BOTTLES DRAWN AEROBIC ONLY 5CC  Final   Culture   Final           BLOOD CULTURE RECEIVED NO  GROWTH TO DATE CULTURE WILL BE HELD FOR 5 DAYS BEFORE ISSUING A FINAL NEGATIVE REPORT Performed at [facility]    Report Status PENDING  Incomplete  Culture, blood (routine x 2)     Status: None (Preliminary result)   Collection Time: 06/01/14 10:45 PM  Result Value Ref Range Status   Specimen Description BLOOD LEFT HAND  Final   Special Requests BOTTLES DRAWN AEROBIC ONLY 4CC  Final   Culture   Final           BLOOD CULTURE RECEIVED NO GROWTH TO DATE CULTURE WILL BE HELD FOR 5 DAYS BEFORE ISSUING A FINAL NEGATIVE REPORT Performed at [facility]    Report Status PENDING  Incomplete     Studies:  Recent x-ray studies have been reviewed in detail by the Attending Physician  Scheduled Meds:  Scheduled Meds: . antiseptic oral rinse  7 mL Mouth Rinse BID  . clopidogrel  75 mg Oral Daily  . heparin  5,000 Units Subcutaneous 3 times per day  . hydrALAZINE  10 mg Oral BID  . insulin aspart  0-9 Units Subcutaneous TID WC  . isosorbide mononitrate  15 mg Oral Daily  . oseltamivir  30 mg Oral Daily  . predniSONE  40 mg Oral Q breakfast  . pregabalin  75 mg Oral BID  . psyllium  1 packet Oral QHS  . sertraline  50 mg Oral QHS  . sodium chloride  3 mL Intravenous Q12H    Time spent on care of this patient: 40 mins   Allie Bossier [facility]  [facility] Office  [phone] Pager - [phone]  On-Call/Text Page:      [email or web address]      password [email or web address]  If 7PM-7AM, please contact night-coverage [email or web address] Password [email or web address] 06/05/2014, 5:09 PM   LOS: 5 days   Care during the described time interval was provided by me .  I have reviewed this patient's available data, including medical history, events of note, physical examination, radiology studies and test results as part of my evaluation  Dia Crawford, MD [phone] Pager

## 2014-06-06 ENCOUNTER — Inpatient Hospital Stay (HOSPITAL_COMMUNITY): Payer: Medicare Other

## 2014-06-06 LAB — CBC WITH DIFFERENTIAL/PLATELET
BASOS ABS: 0 10*3/uL (ref 0.0–0.1)
BASOS PCT: 0 % (ref 0–1)
EOS ABS: 0 10*3/uL (ref 0.0–0.7)
EOS PCT: 0 % (ref 0–5)
HEMATOCRIT: 29.3 % — AB (ref 39.0–52.0)
HEMOGLOBIN: 10.1 g/dL — AB (ref 13.0–17.0)
LYMPHS PCT: 6 % — AB (ref 12–46)
Lymphs Abs: 0.4 10*3/uL — ABNORMAL LOW (ref 0.7–4.0)
MCH: 29 pg (ref 26.0–34.0)
MCHC: 34.5 g/dL (ref 30.0–36.0)
MCV: 84.2 fL (ref 78.0–100.0)
Monocytes Absolute: 0.4 10*3/uL (ref 0.1–1.0)
Monocytes Relative: 6 % (ref 3–12)
NEUTROS ABS: 5.9 10*3/uL (ref 1.7–7.7)
NEUTROS PCT: 88 % — AB (ref 43–77)
PLATELETS: 239 10*3/uL (ref 150–400)
RBC: 3.48 MIL/uL — AB (ref 4.22–5.81)
RDW: 12.7 % (ref 11.5–15.5)
WBC: 6.7 10*3/uL (ref 4.0–10.5)

## 2014-06-06 LAB — COMPREHENSIVE METABOLIC PANEL
ALK PHOS: 83 U/L (ref 39–117)
ALT: 37 U/L (ref 0–53)
AST: 36 U/L (ref 0–37)
Albumin: 1.9 g/dL — ABNORMAL LOW (ref 3.5–5.2)
Anion gap: 17 — ABNORMAL HIGH (ref 5–15)
BILIRUBIN TOTAL: 0.4 mg/dL (ref 0.3–1.2)
BUN: 104 mg/dL — AB (ref 6–23)
CO2: 19 mmol/L (ref 19–32)
CREATININE: 5.71 mg/dL — AB (ref 0.50–1.35)
Calcium: 7.3 mg/dL — ABNORMAL LOW (ref 8.4–10.5)
Chloride: 94 mmol/L — ABNORMAL LOW (ref 96–112)
GFR calc Af Amer: 10 mL/min — ABNORMAL LOW (ref >90)
GFR calc non Af Amer: 9 mL/min — ABNORMAL LOW (ref >90)
Glucose, Bld: 204 mg/dL — ABNORMAL HIGH (ref 70–99)
Potassium: 5 mmol/L (ref 3.5–5.1)
Sodium: 130 mmol/L — ABNORMAL LOW (ref 135–145)
TOTAL PROTEIN: 5.7 g/dL — AB (ref 6.0–8.3)

## 2014-06-06 LAB — GLUCOSE, CAPILLARY
GLUCOSE-CAPILLARY: 187 mg/dL — AB (ref 70–99)
GLUCOSE-CAPILLARY: 281 mg/dL — AB (ref 70–99)
Glucose-Capillary: 175 mg/dL — ABNORMAL HIGH (ref 70–99)
Glucose-Capillary: 255 mg/dL — ABNORMAL HIGH (ref 70–99)

## 2014-06-06 LAB — MAGNESIUM: MAGNESIUM: 2.4 mg/dL (ref 1.5–2.5)

## 2014-06-06 LAB — SEDIMENTATION RATE: Sed Rate: 135 mm/hr — ABNORMAL HIGH (ref 0–16)

## 2014-06-06 MED ORDER — INSULIN ASPART 100 UNIT/ML ~~LOC~~ SOLN
0.0000 [IU] | Freq: Three times a day (TID) | SUBCUTANEOUS | Status: DC
Start: 2014-06-07 — End: 2014-06-08
  Administered 2014-06-07: 3 [IU] via SUBCUTANEOUS
  Administered 2014-06-07: 2 [IU] via SUBCUTANEOUS

## 2014-06-06 MED ORDER — IPRATROPIUM-ALBUTEROL 0.5-2.5 (3) MG/3ML IN SOLN: 3.0000 mL | RESPIRATORY_TRACT | Status: DC

## 2014-06-06 MED ORDER — INSULIN ASPART 100 UNIT/ML ~~LOC~~ SOLN
0.0000 [IU] | Freq: Every day | SUBCUTANEOUS | Status: DC
Start: 2014-06-06 — End: 2014-06-08
  Administered 2014-06-07: 3 [IU] via SUBCUTANEOUS
  Administered 2014-06-07: 5 [IU] via SUBCUTANEOUS

## 2014-06-06 MED ORDER — IPRATROPIUM-ALBUTEROL 0.5-2.5 (3) MG/3ML IN SOLN
3.0000 mL | Freq: Three times a day (TID) | RESPIRATORY_TRACT | Status: DC
  Administered 2014-06-07 – 2014-06-08 (×3): 3 mL via RESPIRATORY_TRACT
  Filled 2014-06-06 (×4): qty 3

## 2014-06-06 MED ORDER — IPRATROPIUM-ALBUTEROL 0.5-2.5 (3) MG/3ML IN SOLN: 3.0000 mL | RESPIRATORY_TRACT | Status: DC | PRN

## 2014-06-06 MED ORDER — IPRATROPIUM-ALBUTEROL 0.5-2.5 (3) MG/3ML IN SOLN
3.0000 mL | RESPIRATORY_TRACT | Status: DC
  Administered 2014-06-06: 3 mL via RESPIRATORY_TRACT
  Filled 2014-06-06: qty 3

## 2014-06-06 MED ORDER — HYDRALAZINE HCL 10 MG PO TABS
5.0000 mg | ORAL_TABLET | Freq: Two times a day (BID) | ORAL | Status: DC
  Administered 2014-06-07 – 2014-06-11 (×9): 5 mg via ORAL
  Filled 2014-06-06 (×13): qty 1

## 2014-06-06 NOTE — Progress Notes (Signed)
[facility] TEAM 1 - Stepdown/ICU TEAM Progress Note  Darrell Richardson IEP:[id number] DOB: March 14, 1941 DOA: 05/31/2014 PCP: [name], MD  Admit HPI / Brief Narrative: Darrell Richardson is a 74 y.o. WM PMHx  diabetes type II uncontrolled, CHF, HTN, CKD and CVA Residual Lt Hemiparesis came in to [facility] complaining about shortness of breath. They don't have beds and stepdown so they transferred him to [facility] for further medical evaluation. This gentleman gets his cardiac care through her clinic, since last night he was complaining about shortness of breath, orthopnea, increased lower extremity edema he couldn't sleep very well so he came into the hospital in a.m. Was found to be in acute respiratory failure secondary to acute CHF exacerbation, placed on BiPAP and sent to [facility] for further evaluation. In the ED troponin was negative, creatinine 2.44, hemoglobin 13.3. BNP is 7363 (reference value 0-125 pg/mL). Patient was given 1 dose of Lasix and sent for further evaluation.   HPI/Subjective: 2/28 A/O x 4, joking with staff and family. Hands and feet swelling/pain almost completely resolved today.    Assessment/Plan:  Cardiomyopathy/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 CHF -Patient presented with SOB, orthopnea, lower extremity edema and BNP of 7300. -Continue BiPAP.PRN -Strict In and Out; Since Admission + 1.7 L  - Daily Am Weight;Admission wt= 108.3 Kg             2/28 bed wt= 110.5 Kg -Losartan held because of worsening creatinine.  Acute on chronic respiratory failure with hypoxia/bronchitis? - Positive fever on Admission, negative leukocytosis, bands or left shift. Continues spike fevers.  -Continued SOB. Patient not on O2 at home. -VQ scan; normal  Hpertension -Pt still  Hypotensive -Decrease Hydralazine to 5 mg BID  -Continue Imdur 15 mg daily   Diabetes mellitus type 2 uncontrolled -2/22 hemoglobin A1c = 7.5 . -Continue  sensitive SSI .   Acute on chronic renal failure  (baseline 2.5 and 2.8),  -2/28 Currently Cr= 5.71, continues to increase; nephrology following -Nephrology feels most like secondary to low BP; -See hypertension  History of CVA -Residual left hemiparesis. Per wife did not attend inpatient rehabilitation  -PT recommends CIR   -OT evaluation pending -This is stable, patient is on Plavix, continued.  Gout -Uric acid= 8.3, goal <6  -Continue prednisone 40 mg daily -Secondary to patient's worsening renal function will DC allopurinol, in addition can exacerbate an acute flare.     Code Status: FULL Family Communication:  family present at time of exam Disposition Plan: Per cardiology and nephrology    Consultants: Dr.Daniel R Bensimhon (cardiology heart failure team) Dr.James L Deterding (nephrology)    Procedure/Significant Events: 2/22 echocardiogram;- Left ventricle: mild LVH. -LVEF= 40% to 45%. akinesis of inferolateral and inferior myocardium.  -(grade 2 diastolic dysfunction).  - Left atrium: severely dilated. - Pericardium, extracardiac: A small pericardial effusion was identified.  2/22 PCXR;Congestive heart failure. 2/22 renal ultrasound;Marland Kitchen No hydronephrosis.- Small bilateral renal cysts. Borderline increased parenchymal echogenicity right kidney. -Bilateral, Lt >>Rt  renal cortical thinning. 2/27 VQ scan; normal    Culture 2/22 MRSA by PCR negative 2/23 sputum pending 2/23 respiratory virus panel negative 2/23 blood right/left hand NGTD   Antibiotics: Azithromycin 2/23>> stopped 2/24 Vancomycin 2/24>> stopped 2/24 Tamiflu 2/24>>   DVT prophylaxis: Subcutaneous heparin   Devices NA   LINES / TUBES:  NA    Continuous Infusions:    Objective: VITAL SIGNS: Temp: 97.3 F (36.3 C) (02/28 1200) Temp Source: Oral (  02/28 1200) BP: 119/49 mmHg (02/28 1400) Pulse Rate: 90 (02/28 1400) SPO2; FIO2:   Intake/Output Summary (Last 24 hours) at  06/06/14 1652 Last data filed at 06/06/14 1300  Gross per 24 hour  Intake    780 ml  Output    835 ml  Net    -55 ml     Exam: General A/O x 4, joking with staff and family, No acute respiratory distress Lungs: Clear to auscultation bilaterally without wheezes or crackles Cardiovascular:  Regular rhythm and rate without murmur gallop or rub normal S1 and S2 Abdomen: Obese, Nontender, nondistended, soft, bowel sounds positive, no rebound, no ascites, no appreciable mass Extremities: No significant cyanosis, clubbing, swellling bilat hands resolved , erythema resolved, bilateral lower extremities edema decreased, erythematous rash bilateral calves resolved. tenderness in feet resolved Neurologic; within normal limit   Data Reviewed: Basic Metabolic Panel:  Recent Labs Lab 06/03/14 0314 06/03/14 2100 06/04/14 0327 06/05/14 0300 06/06/14 0230  NA 132* 131* 134* 132* 130*  K 4.0 4.0 4.3 4.7 5.0  CL 98 98 97 96 94*  CO2 22 23 22 23 19   GLUCOSE 192* 161* 191* 184* 204*  BUN 51* 59* 65* 84* 104*  CREATININE 3.92* 4.71* 5.01* 5.56* 5.71*  CALCIUM 7.3* 7.3* 7.5* 7.3* 7.3*  MG  --   --  2.1 2.3 2.4  PHOS  --  3.9  --   --   --    Liver Function Tests:  Recent Labs Lab 06/03/14 0314 06/03/14 2100 06/04/14 0327 06/05/14 0300 06/06/14 0230  AST 21 27 28 31  36  ALT 13 17 21 27  37  ALKPHOS 74 75 79 81 83  BILITOT 1.0 0.6 0.8 0.4 0.4  PROT 5.6* 5.5* 5.8* 5.6* 5.7*  ALBUMIN 2.1* 1.9* 1.9* 1.8* 1.9*   No results for input(s): LIPASE, AMYLASE in the last 168 hours. No results for input(s): AMMONIA in the last 168 hours. CBC:  Recent Labs Lab 06/03/14 0314 06/03/14 2100 06/04/14 0327 06/05/14 0300 06/06/14 0230  WBC 8.9 8.1 9.3 8.0 6.7  NEUTROABS  --  6.3 7.5 6.9 5.9  HGB 10.5* 10.1* 10.2* 10.0* 10.1*  HCT 32.1* 29.9* 30.7* 29.1* 29.3*  MCV 87.0 86.2 86.7 85.1 84.2  PLT 189 174 191 204 239   Cardiac Enzymes:  Recent Labs Lab 06/03/14 2100  CKTOTAL 205   BNP  (last 3 results) No results for input(s): BNP in the last 8760 hours.  ProBNP (last 3 results) No results for input(s): PROBNP in the last 8760 hours.  CBG:  Recent Labs Lab 06/05/14 1127 06/05/14 1609 06/05/14 2144 06/06/14 0757 06/06/14 1222  GLUCAP 152* 189* 209* 187* 175*    Recent Results (from the past 240 hour(s))  MRSA PCR Screening     Status: None   Collection Time: 05/31/14 10:55 AM  Result Value Ref Range Status   MRSA by PCR NEGATIVE NEGATIVE Final    Comment:        The GeneXpert MRSA Assay (FDA approved for NASAL specimens only), is one component of a comprehensive MRSA colonization surveillance program. It is not intended to diagnose MRSA infection nor to guide or monitor treatment for MRSA infections.   Respiratory virus panel (routine influenza)     Status: None   Collection Time: 06/01/14  9:01 PM  Result Value Ref Range Status   Source - RVPAN NASAL SWAB  Corrected   Respiratory Syncytial Virus A Negative Negative Final   Respiratory Syncytial Virus B Negative Negative  Final   Influenza A Negative Negative Final   Influenza B Negative Negative Final   Parainfluenza 1 Negative Negative Final   Parainfluenza 2 Negative Negative Final   Parainfluenza 3 Negative Negative Final   Metapneumovirus Negative Negative Final   Rhinovirus Negative Negative Final   Adenovirus Negative Negative Final    Comment: (NOTE) Performed At: [facility] [place], [place] [id number] Lindon Romp MD WN:[id number]   Culture, blood (routine x 2)     Status: None (Preliminary result)   Collection Time: 06/01/14 10:33 PM  Result Value Ref Range Status   Specimen Description BLOOD RIGHT HAND  Final   Special Requests BOTTLES DRAWN AEROBIC ONLY 5CC  Final   Culture   Final           BLOOD CULTURE RECEIVED NO GROWTH TO DATE CULTURE WILL BE HELD FOR 5 DAYS BEFORE ISSUING A FINAL NEGATIVE REPORT Performed at [facility]    Report  Status PENDING  Incomplete  Culture, blood (routine x 2)     Status: None (Preliminary result)   Collection Time: 06/01/14 10:45 PM  Result Value Ref Range Status   Specimen Description BLOOD LEFT HAND  Final   Special Requests BOTTLES DRAWN AEROBIC ONLY 4CC  Final   Culture   Final           BLOOD CULTURE RECEIVED NO GROWTH TO DATE CULTURE WILL BE HELD FOR 5 DAYS BEFORE ISSUING A FINAL NEGATIVE REPORT Performed at [facility]    Report Status PENDING  Incomplete     Studies:  Recent x-ray studies have been reviewed in detail by the Attending Physician  Scheduled Meds:  Scheduled Meds: . antiseptic oral rinse  7 mL Mouth Rinse BID  . clopidogrel  75 mg Oral Daily  . heparin  5,000 Units Subcutaneous 3 times per day  . hydrALAZINE  10 mg Oral BID  . insulin aspart  0-9 Units Subcutaneous TID WC  . isosorbide mononitrate  15 mg Oral Daily  . predniSONE  40 mg Oral Q breakfast  . pregabalin  75 mg Oral BID  . psyllium  1 packet Oral QHS  . sertraline  50 mg Oral QHS  . sodium chloride  3 mL Intravenous Q12H    Time spent on care of this patient: 40 mins   Allie [facility]  [facility] Office  [phone] Pager - [phone]  On-Call/Text Page:      [email or web address]      password [email or web address]  If 7PM-7AM, please contact night-coverage [email or web address] Password [facility] 06/06/2014, 4:52 PM   LOS: 6 days   Care during the described time interval was provided by me .  I have reviewed this patient's available data, including medical history, events of note, physical examination, radiology studies and test results as part of my evaluation  Dia Crawford, MD [phone] Pager

## 2014-06-06 NOTE — Progress Notes (Signed)
Assessment/Plan: 1 CKD4 with AKI worsening caused by hemodynamic change, ie low bp. 2 CHF/Vol overload- improved 3 Prob OSA Plan : CXR, POs, May need IVF  Subjective: Interval History: Better, ox sats drop with snoring  Objective: Vital signs in last 24 hours: Temp:  [97.4 F (36.3 C)-98.6 F (37 C)] 97.4 F (36.3 C) (02/28 0758) Pulse Rate:  [68-79] 75 (02/28 1000) Resp:  [13-24] 23 (02/28 1000) BP: (100-136)/(50-86) 107/50 mmHg (02/28 1000) SpO2:  [91 %-100 %] 92 % (02/28 1000) Weight:  [110.5 kg (243 lb 9.7 oz)] 110.5 kg (243 lb 9.7 oz) (02/28 0400) Weight change: 1.8 kg (3 lb 15.5 oz)  Intake/Output from previous day: 02/27 0701 - 02/28 0700 In: -  Out: 960 [Urine:960] Intake/Output this shift: Total I/O In: -  Out: 100 [Urine:100]  General appearance: alert and cooperative Back: symmetric, no curvature. ROM normal. No CVA tenderness., no pitting Resp: clear to auscultation bilaterally Cardio: regular rate and rhythm, S1, S2 normal, no murmur, click, rub or gallop Extremities: extremities normal, atraumatic, no cyanosis or edema  Lab Results:  Recent Labs  06/05/14 0300 06/06/14 0230  WBC 8.0 6.7  HGB 10.0* 10.1*  HCT 29.1* 29.3*  PLT 204 239   BMET:  Recent Labs  06/05/14 0300 06/06/14 0230  NA 132* 130*  K 4.7 5.0  CL 96 94*  CO2 23 19  GLUCOSE 184* 204*  BUN 84* 104*  CREATININE 5.56* 5.71*  CALCIUM 7.3* 7.3*    Recent Labs  06/03/14 2100  PTH 176*   Iron Studies: No results for input(s): IRON, TIBC, TRANSFERRIN, FERRITIN in the last 72 hours. Studies/Results: Nm Pulmonary Perf And Vent  06/05/2014   CLINICAL DATA:  Dyspnea. History of gout, diabetes, hypertension, stroke, heart failure.  EXAM: NUCLEAR MEDICINE VENTILATION - PERFUSION LUNG SCAN  TECHNIQUE: Ventilation images were obtained in multiple projections using inhaled aerosol technetium 99 M DTPA. Perfusion images were obtained in multiple projections after intravenous injection  of Tc-74m MAA.  RADIOPHARMACEUTICALS:  40.0 mCi Tc-17m DTPA aerosol and 6.0 mCi Tc-63m MAA  COMPARISON:  Chest x-ray 06/03/2014  FINDINGS: Ventilation: No focal ventilation defect.  Perfusion: No wedge shaped peripheral perfusion defects to suggest acute pulmonary embolism.  IMPRESSION: Normal ventilation perfusion scan.   Electronically Signed   By: Nolon Nations M.D.   On: 06/05/2014 14:52    Scheduled: . antiseptic oral rinse  7 mL Mouth Rinse BID  . clopidogrel  75 mg Oral Daily  . heparin  5,000 Units Subcutaneous 3 times per day  . hydrALAZINE  10 mg Oral BID  . insulin aspart  0-9 Units Subcutaneous TID WC  . isosorbide mononitrate  15 mg Oral Daily  . predniSONE  40 mg Oral Q breakfast  . pregabalin  75 mg Oral BID  . psyllium  1 packet Oral QHS  . sertraline  50 mg Oral QHS  . sodium chloride  3 mL Intravenous Q12H    LOS: 6 days   Dalvin Clipper C 06/06/2014,10:16 AM

## 2014-06-06 NOTE — Progress Notes (Signed)
  Chart reviewed. ESR back today at 135.   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 Will need to discuss with [facility]. Myeloma and endocarditis are also on the differential. If not responding well to current prednisone dose may be worth considering TEE to evaluate for endocarditis given ongoing fevers. SPEP and UPEP are pending.   Kaulana Brindle,MD 10:14 PM

## 2014-06-07 ENCOUNTER — Encounter (HOSPITAL_COMMUNITY): Payer: Self-pay | Admitting: *Deleted

## 2014-06-07 ENCOUNTER — Encounter (HOSPITAL_COMMUNITY)
Admission: AD | Disposition: A | Discharge status: Rehabilitation Facility | Payer: Self-pay | Source: Other Acute Inpatient Hospital | Attending: Internal Medicine

## 2014-06-07 DIAGNOSIS — I340 Nonrheumatic mitral (valve) insufficiency: Secondary | ICD-10-CM

## 2014-06-07 DIAGNOSIS — R509 Fever, unspecified: Secondary | ICD-10-CM | POA: Diagnosis present

## 2014-06-07 DIAGNOSIS — I509 Heart failure, unspecified: Secondary | ICD-10-CM | POA: Diagnosis present

## 2014-06-07 HISTORY — PX: TEE WITHOUT CARDIOVERSION: SHX5443

## 2014-06-07 LAB — BASIC METABOLIC PANEL
Anion gap: 12 (ref 5–15)
BUN: 133 mg/dL — ABNORMAL HIGH (ref 6–23)
CALCIUM: 6.8 mg/dL — AB (ref 8.4–10.5)
CO2: 21 mmol/L (ref 19–32)
Chloride: 93 mmol/L — ABNORMAL LOW (ref 96–112)
Creatinine, Ser: 5.37 mg/dL — ABNORMAL HIGH (ref 0.50–1.35)
GFR calc Af Amer: 11 mL/min — ABNORMAL LOW (ref >90)
GFR, EST NON AFRICAN AMERICAN: 10 mL/min — AB (ref >90)
GLUCOSE: 336 mg/dL — AB (ref 70–99)
Potassium: 5.6 mmol/L — ABNORMAL HIGH (ref 3.5–5.1)
Sodium: 126 mmol/L — ABNORMAL LOW (ref 135–145)

## 2014-06-07 LAB — CBC WITH DIFFERENTIAL/PLATELET
Basophils Absolute: 0 10*3/uL (ref 0.0–0.1)
Basophils Relative: 0 % (ref 0–1)
EOS ABS: 0 10*3/uL (ref 0.0–0.7)
Eosinophils Relative: 0 % (ref 0–5)
HCT: 31.1 % — ABNORMAL LOW (ref 39.0–52.0)
Hemoglobin: 10.9 g/dL — ABNORMAL LOW (ref 13.0–17.0)
Lymphocytes Relative: 6 % — ABNORMAL LOW (ref 12–46)
Lymphs Abs: 0.3 10*3/uL — ABNORMAL LOW (ref 0.7–4.0)
MCH: 29.4 pg (ref 26.0–34.0)
MCHC: 35 g/dL (ref 30.0–36.0)
MCV: 83.8 fL (ref 78.0–100.0)
MONO ABS: 0.6 10*3/uL (ref 0.1–1.0)
Monocytes Relative: 9 % (ref 3–12)
NEUTROS ABS: 5 10*3/uL (ref 1.7–7.7)
Neutrophils Relative %: 85 % — ABNORMAL HIGH (ref 43–77)
PLATELETS: ADEQUATE 10*3/uL (ref 150–400)
RBC: 3.71 MIL/uL — AB (ref 4.22–5.81)
RDW: 12.7 % (ref 11.5–15.5)
WBC: 5.9 10*3/uL (ref 4.0–10.5)

## 2014-06-07 LAB — GLUCOSE, CAPILLARY
GLUCOSE-CAPILLARY: 221 mg/dL — AB (ref 70–99)
Glucose-Capillary: 178 mg/dL — ABNORMAL HIGH (ref 70–99)
Glucose-Capillary: 194 mg/dL — ABNORMAL HIGH (ref 70–99)
Glucose-Capillary: 359 mg/dL — ABNORMAL HIGH (ref 70–99)

## 2014-06-07 LAB — CREATININE, URINE, RANDOM: Creatinine, Urine: 86.61 mg/dL

## 2014-06-07 LAB — COMPREHENSIVE METABOLIC PANEL
ALT: 36 U/L (ref 0–53)
AST: 33 U/L (ref 0–37)
Albumin: 1.9 g/dL — ABNORMAL LOW (ref 3.5–5.2)
Alkaline Phosphatase: 79 U/L (ref 39–117)
Anion gap: 12 (ref 5–15)
BUN: 126 mg/dL — AB (ref 6–23)
CHLORIDE: 92 mmol/L — AB (ref 96–112)
CO2: 23 mmol/L (ref 19–32)
Calcium: 7 mg/dL — ABNORMAL LOW (ref 8.4–10.5)
Creatinine, Ser: 5.59 mg/dL — ABNORMAL HIGH (ref 0.50–1.35)
GFR calc Af Amer: 10 mL/min — ABNORMAL LOW (ref >90)
GFR, EST NON AFRICAN AMERICAN: 9 mL/min — AB (ref >90)
GLUCOSE: 191 mg/dL — AB (ref 70–99)
Potassium: 5.5 mmol/L — ABNORMAL HIGH (ref 3.5–5.1)
Sodium: 127 mmol/L — ABNORMAL LOW (ref 135–145)
Total Bilirubin: 0.7 mg/dL (ref 0.3–1.2)
Total Protein: 5.9 g/dL — ABNORMAL LOW (ref 6.0–8.3)

## 2014-06-07 LAB — SODIUM, URINE, RANDOM: SODIUM UR: 21 mmol/L

## 2014-06-07 LAB — MAGNESIUM: Magnesium: 2.7 mg/dL — ABNORMAL HIGH (ref 1.5–2.5)

## 2014-06-07 LAB — TSH: TSH: 1.183 u[IU]/mL (ref 0.350–4.500)

## 2014-06-07 SURGERY — ECHOCARDIOGRAM, TRANSESOPHAGEAL
Anesthesia: Moderate Sedation

## 2014-06-07 MED ORDER — PREGABALIN 75 MG PO CAPS
75.0000 mg | ORAL_CAPSULE | Freq: Every day | ORAL | Status: DC
Start: 2014-06-08 — End: 2014-06-11
  Administered 2014-06-08 – 2014-06-11 (×4): 75 mg via ORAL
  Filled 2014-06-07 (×6): qty 1

## 2014-06-07 MED ORDER — METHYLPREDNISOLONE SODIUM SUCC 125 MG IJ SOLR
125.0000 mg | INTRAMUSCULAR | Status: DC
Start: 2014-06-07 — End: 2014-06-09
  Administered 2014-06-07 – 2014-06-08 (×2): 125 mg via INTRAVENOUS
  Filled 2014-06-07 (×3): qty 2

## 2014-06-07 MED ORDER — LIDOCAINE VISCOUS 2 % MT SOLN
OROMUCOSAL | Status: AC
  Filled 2014-06-07: qty 15

## 2014-06-07 MED ORDER — SODIUM POLYSTYRENE SULFONATE 15 GM/60ML PO SUSP
30.0000 g | Freq: Once | ORAL | Status: AC
  Administered 2014-06-07: 30 g via ORAL
  Filled 2014-06-07: qty 120

## 2014-06-07 MED ORDER — FUROSEMIDE 10 MG/ML IJ SOLN
80.0000 mg | Freq: Once | INTRAMUSCULAR | Status: AC
  Administered 2014-06-07: 80 mg via INTRAVENOUS
  Filled 2014-06-07: qty 8

## 2014-06-07 MED ORDER — INSULIN GLARGINE 100 UNIT/ML ~~LOC~~ SOLN
10.0000 [IU] | Freq: Every day | SUBCUTANEOUS | Status: DC
  Filled 2014-06-07 (×3): qty 0.1

## 2014-06-07 MED ORDER — MIDAZOLAM HCL 5 MG/ML IJ SOLN
INTRAMUSCULAR | Status: AC
  Filled 2014-06-07: qty 2

## 2014-06-07 MED ORDER — FENTANYL CITRATE 0.05 MG/ML IJ SOLN
INTRAMUSCULAR | Status: DC | PRN
  Administered 2014-06-07: 50 ug via INTRAVENOUS

## 2014-06-07 MED ORDER — METHYLPREDNISOLONE SODIUM SUCC 125 MG IJ SOLR: 125.0000 mg | INTRAMUSCULAR | Status: DC

## 2014-06-07 MED ORDER — FENTANYL CITRATE 0.05 MG/ML IJ SOLN
INTRAMUSCULAR | Status: AC
  Filled 2014-06-07: qty 2

## 2014-06-07 MED ORDER — SODIUM CHLORIDE 0.9 % IV SOLN: INTRAVENOUS | Status: DC

## 2014-06-07 MED ORDER — MIDAZOLAM HCL 10 MG/2ML IJ SOLN
INTRAMUSCULAR | Status: DC | PRN
Start: 2014-06-07 — End: 2014-06-07
  Administered 2014-06-07: 3 mg via INTRAVENOUS

## 2014-06-07 MED ORDER — LIDOCAINE VISCOUS 2 % MT SOLN
OROMUCOSAL | Status: DC | PRN
Start: 2014-06-07 — End: 2014-06-07
  Administered 2014-06-07: 10 mL via OROMUCOSAL

## 2014-06-07 NOTE — Progress Notes (Addendum)
Advanced Heart Failure Rounding Note   Subjective:    Afebrile. Continues to have muscle and joint aches. Mild response to prednisone.  BMET pending. ESR 135. No orthopnea or PND.  All cultures negative including bcx from [facility]  Diuresis on hold. Weight up 1 pound.    Objective:   Weight Range:  Vital Signs:   Temp:  [97.2 F (36.2 C)-97.9 F (36.6 C)] 97.3 F (36.3 C) (02/29 0735) Pulse Rate:  [61-90] 61 (02/29 0735) Resp:  [14-28] 22 (02/29 0735) BP: (92-136)/(49-91) 124/68 mmHg (02/29 0735) SpO2:  [92 %-100 %] 99 % (02/29 0735) Weight:  [244 lb 11.4 oz (111 kg)] 244 lb 11.4 oz (111 kg) (02/29 0500) Last BM Date: 06/04/14  Weight change: Filed Weights   06/05/14 0330 06/06/14 0400 06/07/14 0500  Weight: 239 lb 10.2 oz (108.7 kg) 243 lb 9.7 oz (110.5 kg) 244 lb 11.4 oz (111 kg)    Intake/Output:   Intake/Output Summary (Last 24 hours) at 06/07/14 0749 Last data filed at 06/07/14 0600  Gross per 24 hour  Intake   1260 ml  Output   1545 ml  Net   -285 ml     Physical Exam: General: Elderly lying in bed .No dyspnea HEENT: normal Neck: supple. JVP hard to see. Carotids 2+ bilat; no bruits. No lymphadenopathy or thryomegaly appreciated. Cor: PMI nondisplaced. Regular rate & rhythm. No rubs, gallops or murmurs. No S3.  Lungs: crackles in bases on 4 liters [place] Abdomen: soft, nontender, nondistended. No hepatosplenomegaly. No bruits or masses. Good bowel sounds. Extremities no cyanosis, clubbing, rash, R and LLE tr edema. DPs trace bialterally Neuro: alert & orientedx3, cranial nerves grossly intact. moves all 4 extremities w/o difficulty. Affect pleasant  Telemetry:  SR   Labs: Basic Metabolic Panel:  Recent Labs Lab 06/03/14 0314 06/03/14 2100 06/04/14 0327 06/05/14 0300 06/06/14 0230  NA 132* 131* 134* 132* 130*  K 4.0 4.0 4.3 4.7 5.0  CL 98 98 97 96 94*  CO2 _0 GLUCOSE 192* 161* 191* 184* 204*  BUN 51* 59* 65* 84* 104*  CREATININE  3.92* 4.71* 5.01* 5.56* 5.71*  CALCIUM 7.3* 7.3* 7.5* 7.3* 7.3*  MG  --   --  2.1 2.3 2.4  PHOS  --  3.9  --   --   --     Liver Function Tests:  Recent Labs Lab 06/03/14 0314 06/03/14 2100 06/04/14 0327 06/05/14 0300 06/06/14 0230  AST _1 36  ALT _2 37  ALKPHOS 74 75 79 81 83  BILITOT 1.0 0.6 0.8 0.4 0.4  PROT 5.6* 5.5* 5.8* 5.6* 5.7*  ALBUMIN 2.1* 1.9* 1.9* 1.8* 1.9*   No results for input(s): LIPASE, AMYLASE in the last 168 hours. No results for input(s): AMMONIA in the last 168 hours.  CBC:  Recent Labs Lab 06/03/14 0314 06/03/14 2100 06/04/14 0327 06/05/14 0300 06/06/14 0230  WBC 8.9 8.1 9.3 8.0 6.7  NEUTROABS  --  6.3 7.5 6.9 5.9  HGB 10.5* 10.1* 10.2* 10.0* 10.1*  HCT 32.1* 29.9* 30.7* 29.1* 29.3*  MCV 87.0 86.2 86.7 85.1 84.2  PLT 189 174 191 204 239    Cardiac Enzymes:  Recent Labs Lab 06/03/14 2100  CKTOTAL 205    BNP: BNP (last 3 results) No results for input(s): BNP in the last 8760 hours.  ProBNP (last 3 results) No results for input(s): PROBNP in the last 8760 hours.    Other results:  Imaging: Nm Pulmonary Perf And Vent  06/05/2014   CLINICAL DATA:  Dyspnea. History of gout, diabetes, hypertension, stroke, heart failure.  EXAM: NUCLEAR MEDICINE VENTILATION - PERFUSION LUNG SCAN  TECHNIQUE: Ventilation images were obtained in multiple projections using inhaled aerosol technetium 99 M DTPA. Perfusion images were obtained in multiple projections after intravenous injection of Tc-34mMAA.  RADIOPHARMACEUTICALS:  40.0 mCi Tc-934mTPA aerosol and 6.0 mCi Tc-9949mA  COMPARISON:  Chest x-ray 06/03/2014  FINDINGS: Ventilation: No focal ventilation defect.  Perfusion: No wedge shaped peripheral perfusion defects to suggest acute pulmonary embolism.  IMPRESSION: Normal ventilation perfusion scan.   Electronically Signed   By: EliNolon NationsD.   On: 06/05/2014 14:52   Dg Chest Port 1 View  06/06/2014   CLINICAL DATA:   [age]ar old male with congestive heart failure  EXAM: PORTABLE CHEST - 1 VIEW  COMPARISON:  Prior chest x-ray 06/03/2014  FINDINGS: Stable cardiomegaly with left heart prominence. Decreased interstitial pulmonary edema but persistent pulmonary vascular congestion. Left retrocardiac opacity remains unchanged. No large pleural effusion and no evidence of pneumothorax. No acute osseous abnormality.  IMPRESSION: 1. Resolving pulmonary interstitial edema with persistent vascular congestion. 2. Unchanged left retrocardiac opacity favored to reflect a combination of atelectasis and perhaps pleural effusion.   Electronically Signed   By: HeaJacqulynn CadetD.   On: 06/06/2014 12:06     Medications:     Scheduled Medications: . antiseptic oral rinse  7 mL Mouth Rinse BID  . clopidogrel  75 mg Oral Daily  . heparin  5,000 Units Subcutaneous 3 times per day  . hydrALAZINE  5 mg Oral BID  . insulin aspart  0-5 Units Subcutaneous QHS  . insulin aspart  0-9 Units Subcutaneous TID WC  . ipratropium-albuterol  3 mL Nebulization TID  . isosorbide mononitrate  15 mg Oral Daily  . predniSONE  40 mg Oral Q breakfast  . pregabalin  75 mg Oral BID  . psyllium  1 packet Oral QHS  . sertraline  50 mg Oral QHS  . sodium chloride  3 mL Intravenous Q12H    Infusions:    PRN Medications: acetaminophen **OR** acetaminophen, diphenhydrAMINE, HYDROcodone-acetaminophen, hydrocortisone cream, ipratropium-albuterol, morphine injection, ondansetron (ZOFRAN) IV, oxymetazoline   Assessment:   1. A/C Systolic HF 2. Acute Respiratory Failure- on Bipap now weaned to nasal cannula.  3. Hypertensive crisis 4. DM 5. Acute on CKD, stage IV - Creatinine baseline 2.5 6. 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 Chest Pain 1 week ago 8. Fever - ? Acute bronchitis vs flu 9. Acute gout - uric acid 8.3 10. Hyponatremia   Plan/Discussion:    [facility] still [facility] and muscles sore. Suspect had viral  illness. CK only 202. ESR 135 on prednisone.  ? Endocarditis. May need TEE.    BMET pending.    From a HF standpoint he is stable. Appears fluid replete but not markedly overloaded. (renal feels he is overloaded). Weight up one pound.  Would continue to hold diuretics unless Renal feels otherwise. Doubt he is low output. Continue medical management of CAD.   CLEGG,AMY,NP-C  7:49 AM  Patient seen and examined with AmyDarrick GrinderP. We discussed all aspects of the encounter. I agree with the assessment and plan as stated above.   Stable from HF perspective. However continues to have severe join and muscle aches with ESR 135. I spoke with Dr. AndOuida [facility] about differential including: PMR, myeloma, vasculitis, dermatomyositis, endocarditis, late-onset  RA, etc.   If PMR would expect more rapid response to steroids. CK levels ok. Will switch prednisone to solumedrol 125 daily. Will check: Rheumatoid factor, CCP, Lyme disease abs, parvo B19, ANCA, ANA.  SPEP/UPEP already pending. Will plan TEE today.   Renal function seems like it may be  starting to recover slowly.   Benay Spice 8:38 AM

## 2014-06-07 NOTE — H&P (View-Only) (Signed)
Advanced Heart Failure Rounding Note   Subjective:    Afebrile. Continues to have muscle and joint aches. Mild response to prednisone.  BMET pending. ESR 135. No orthopnea or PND.  All cultures negative including bcx from [facility]  Diuresis on hold. Weight up 1 pound.    Objective:   Weight Range:  Vital Signs:   Temp:  [97.2 F (36.2 C)-97.9 F (36.6 C)] 97.3 F (36.3 C) (02/29 0735) Pulse Rate:  [61-90] 61 (02/29 0735) Resp:  [14-28] 22 (02/29 0735) BP: (92-136)/(49-91) 124/68 mmHg (02/29 0735) SpO2:  [92 %-100 %] 99 % (02/29 0735) Weight:  [244 lb 11.4 oz (111 kg)] 244 lb 11.4 oz (111 kg) (02/29 0500) Last BM Date: 06/04/14  Weight change: Filed Weights   06/05/14 0330 06/06/14 0400 06/07/14 0500  Weight: 239 lb 10.2 oz (108.7 kg) 243 lb 9.7 oz (110.5 kg) 244 lb 11.4 oz (111 kg)    Intake/Output:   Intake/Output Summary (Last 24 hours) at 06/07/14 0749 Last data filed at 06/07/14 0600  Gross per 24 hour  Intake   1260 ml  Output   1545 ml  Net   -285 ml     Physical Exam: General: Elderly lying in bed .No dyspnea HEENT: normal Neck: supple. JVP hard to see. Carotids 2+ bilat; no bruits. No lymphadenopathy or thryomegaly appreciated. Cor: PMI nondisplaced. Regular rate & rhythm. No rubs, gallops or murmurs. No S3.  Lungs: crackles in bases on 4 liters Tower Abdomen: soft, nontender, nondistended. No hepatosplenomegaly. No bruits or masses. Good bowel sounds. Extremities no cyanosis, clubbing, rash, R and LLE tr edema. DPs trace bialterally Neuro: alert & orientedx3, cranial nerves grossly intact. moves all 4 extremities w/o difficulty. Affect pleasant  Telemetry:  SR   Labs: Basic Metabolic Panel:  Recent Labs Lab 06/03/14 0314 06/03/14 2100 06/04/14 0327 06/05/14 0300 06/06/14 0230  NA 132* 131* 134* 132* 130*  K 4.0 4.0 4.3 4.7 5.0  CL 98 98 97 96 94*  CO2 _0 GLUCOSE 192* 161* 191* 184* 204*  BUN 51* 59* 65* 84* 104*  CREATININE  3.92* 4.71* 5.01* 5.56* 5.71*  CALCIUM 7.3* 7.3* 7.5* 7.3* 7.3*  MG  --   --  2.1 2.3 2.4  PHOS  --  3.9  --   --   --     Liver Function Tests:  Recent Labs Lab 06/03/14 0314 06/03/14 2100 06/04/14 0327 06/05/14 0300 06/06/14 0230  AST _1 36  ALT _2 37  ALKPHOS 74 75 79 81 83  BILITOT 1.0 0.6 0.8 0.4 0.4  PROT 5.6* 5.5* 5.8* 5.6* 5.7*  ALBUMIN 2.1* 1.9* 1.9* 1.8* 1.9*   No results for input(s): LIPASE, AMYLASE in the last 168 hours. No results for input(s): AMMONIA in the last 168 hours.  CBC:  Recent Labs Lab 06/03/14 0314 06/03/14 2100 06/04/14 0327 06/05/14 0300 06/06/14 0230  WBC 8.9 8.1 9.3 8.0 6.7  NEUTROABS  --  6.3 7.5 6.9 5.9  HGB 10.5* 10.1* 10.2* 10.0* 10.1*  HCT 32.1* 29.9* 30.7* 29.1* 29.3*  MCV 87.0 86.2 86.7 85.1 84.2  PLT 189 174 191 204 239    Cardiac Enzymes:  Recent Labs Lab 06/03/14 2100  CKTOTAL 205    BNP: BNP (last 3 results) No results for input(s): BNP in the last 8760 hours.  ProBNP (last 3 results) No results for input(s): PROBNP in the last 8760 hours.    Other results:  Imaging: Nm Pulmonary Perf And Vent  06/05/2014   CLINICAL DATA:  Dyspnea. History of gout, diabetes, hypertension, stroke, heart failure.  EXAM: NUCLEAR MEDICINE VENTILATION - PERFUSION LUNG SCAN  TECHNIQUE: Ventilation images were obtained in multiple projections using inhaled aerosol technetium 99 M DTPA. Perfusion images were obtained in multiple projections after intravenous injection of Tc-18m MAA.  RADIOPHARMACEUTICALS:  40.0 mCi Tc-68m DTPA aerosol and 6.0 mCi Tc-29m MAA  COMPARISON:  Chest x-ray 06/03/2014  FINDINGS: Ventilation: No focal ventilation defect.  Perfusion: No wedge shaped peripheral perfusion defects to suggest acute pulmonary embolism.  IMPRESSION: Normal ventilation perfusion scan.   Electronically Signed   By: Nolon Nations M.D.   On: 06/05/2014 14:52   Dg Chest Port 1 View  06/06/2014   CLINICAL DATA:   74 year old male with congestive heart failure  EXAM: PORTABLE CHEST - 1 VIEW  COMPARISON:  Prior chest x-ray 06/03/2014  FINDINGS: Stable cardiomegaly with left heart prominence. Decreased interstitial pulmonary edema but persistent pulmonary vascular congestion. Left retrocardiac opacity remains unchanged. No large pleural effusion and no evidence of pneumothorax. No acute osseous abnormality.  IMPRESSION: 1. Resolving pulmonary interstitial edema with persistent vascular congestion. 2. Unchanged left retrocardiac opacity favored to reflect a combination of atelectasis and perhaps pleural effusion.   Electronically Signed   By: Jacqulynn Cadet M.D.   On: 06/06/2014 12:06     Medications:     Scheduled Medications: . antiseptic oral rinse  7 mL Mouth Rinse BID  . clopidogrel  75 mg Oral Daily  . heparin  5,000 Units Subcutaneous 3 times per day  . hydrALAZINE  5 mg Oral BID  . insulin aspart  0-5 Units Subcutaneous QHS  . insulin aspart  0-9 Units Subcutaneous TID WC  . ipratropium-albuterol  3 mL Nebulization TID  . isosorbide mononitrate  15 mg Oral Daily  . predniSONE  40 mg Oral Q breakfast  . pregabalin  75 mg Oral BID  . psyllium  1 packet Oral QHS  . sertraline  50 mg Oral QHS  . sodium chloride  3 mL Intravenous Q12H    Infusions:    PRN Medications: acetaminophen **OR** acetaminophen, diphenhydrAMINE, HYDROcodone-acetaminophen, hydrocortisone cream, ipratropium-albuterol, morphine injection, ondansetron (ZOFRAN) IV, oxymetazoline   Assessment:   1. A/C Systolic HF 2. Acute Respiratory Failure- on Bipap now weaned to nasal cannula.  3. Hypertensive crisis 4. DM 5. Acute on CKD, stage IV - Creatinine baseline 2.5 6. 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 Chest Pain 1 week ago 8. Fever - ? Acute bronchitis vs flu 9. Acute gout - uric acid 8.3 10. Hyponatremia   Plan/Discussion:    Blood cultures negative on 05/31/14 at [facility]. Though still weak and muscles sore. Suspect had viral  illness. CK only 202. ESR 135 on prednisone.  ? Endocarditis. May need TEE.    BMET pending.    From a HF standpoint he is stable. Appears fluid replete but not markedly overloaded. (renal feels he is overloaded). Weight up one pound.  Would continue to hold diuretics unless Renal feels otherwise. Doubt he is low output. Continue medical management of CAD.   CLEGG,AMY,NP-C  7:49 AM  Patient seen and examined with Darrick Grinder, NP. We discussed all aspects of the encounter. I agree with the assessment and plan as stated above.   Stable from HF perspective. However continue to have severe join and muscle aches with ESR 135. I spoke with Dr. Ouida Sills in Rheumatology about differential including: PMR, myeloma, vasculitis, dermatomyositis, endocarditis, late-onset  RA, etc.   If PMR would expect more rapid response to steroids. CK levels. Will switch prednisone to solumedrol 125 daily. Will check: Rheumatoid factor, CCP, Lyme disease abs, parvo B19, ANCA, ANA.  SPEP/UPEP already pending. Will plan TEE today.   Renal function seems like it may be  starting to recover slowly.   Benay Spice 8:38 AM

## 2014-06-07 NOTE — Progress Notes (Signed)
Rehab Admissions Coordinator Note:  Patient was screened by Retta Diones for appropriateness for an Inpatient Acute Rehab Consult.  At this time, we are recommending Inpatient Rehab consult.  Jodell Cipro M 06/07/2014, 8:29 AM  I can be reached at [phone].

## 2014-06-07 NOTE — Progress Notes (Signed)
PT Cancellation Note  Patient Details Name: Darrell Richardson MRN: [id number] DOB: 03-09-1941   Cancelled Treatment:    Reason Eval/Treat Not Completed: Patient at procedure or test/unavailable. TEE.   Duncan Dull 06/07/2014, 2:27 PM Alben Deeds, [place] DPT  [phone]

## 2014-06-07 NOTE — Progress Notes (Signed)
  Echocardiogram Echocardiogram Transesophageal has been performed.  Bobbye Charleston 06/07/2014, 2:36 PM

## 2014-06-07 NOTE — Progress Notes (Signed)
Inpatient Diabetes Program Recommendations  AACE/ADA: New Consensus Statement on Inpatient Glycemic Control (2013)  Target Ranges:  Prepandial:   less than 140 mg/dL      Peak postprandial:   less than 180 mg/dL (1-2 hours)      Critically ill patients:  140 - 180 mg/dL   Results for CODEY, BURLING (MRN [id number]) as of 06/07/2014 09:43  Ref. Range 06/06/2014 07:57 06/06/2014 12:22 06/06/2014 17:15 06/06/2014 23:12 06/07/2014 07:36  Glucose-Capillary Latest Range: 70-99 mg/dL 187 (H) 175 (H) 255 (H) 281 (H) 178 (H)   Diabetes history: DM2 Outpatient Diabetes medications: Januvia 50 mg daily Current orders for Inpatient glycemic control: Novolog 0-9 units TID with meals, Novolog 0-5 units HS  Inpatient Diabetes Program Recommendations Correction (SSI): Please consider increasing Novolog correction to moderate scale. Insulin - Meal Coverage: Once diet is resumed, please consider ordering Novolog 3 units TID with meals for meal coverage (in addition to Novolog correction).  Note: Patient is currently NPO for TEE today. Noted steroids increased to Solumedrol 125 mg Q24H today which is likely to further increase glucose.   Thanks, Barnie Alderman, RN, MSN, CCRN, CDE Diabetes Coordinator Inpatient Diabetes Program [phone] (Team Pager) [phone] ([facility] office) [phone] [facility] office)'

## 2014-06-07 NOTE — Progress Notes (Signed)
Patient ID: Darrell Richardson, male   DOB: 03-Dec-1940, 74 y.o.   MRN: [id number] S:feels well, but still has arthralgias O:BP 124/68 mmHg  Pulse 61  Temp(Src) 97.3 F (36.3 C) (Oral)  Resp 22  Ht 5\' 8"  (1.727 m)  Wt 111 kg (244 lb 11.4 oz)  BMI 37.22 kg/m2  SpO2 99%  Intake/Output Summary (Last 24 hours) at 06/07/14 0833 Last data filed at 06/07/14 0600  Gross per 24 hour  Intake   1260 ml  Output   1445 ml  Net   -185 ml   Intake/Output: I/O last 3 completed shifts: In: 1260 [P.O.:1260] Out: 2105 [Urine:2105]  Intake/Output this shift:    Weight change: 0.5 kg (1 lb 1.6 oz) Gen:WD WM in NAD CVS:no rub Resp:decreased BS at bases Abd:+BS, soft, NT Ext:+pitting edema L>R lower ext, 1+ presacral edema   Recent Labs Lab 06/02/14 0310 06/03/14 0314 06/03/14 2100 06/04/14 0327 06/05/14 0300 06/06/14 0230 06/07/14 0653  NA 132* 132* 131* 134* 132* 130* 127*  K 4.4 4.0 4.0 4.3 4.7 5.0 5.5*  CL 100 98 98 97 96 94* 92*  CO2 26 22 23 22 23 19 23   GLUCOSE 190* 192* 161* 191* 184* 204* 191*  BUN 46* 51* 59* 65* 84* 104* 126*  CREATININE 3.49* 3.92* 4.71* 5.01* 5.56* 5.71* 5.59*  ALBUMIN  --  2.1* 1.9* 1.9* 1.8* 1.9* 1.9*  CALCIUM 7.3* 7.3* 7.3* 7.5* 7.3* 7.3* 7.0*  PHOS  --   --  3.9  --   --   --   --   AST  --  21 27 28 31  36 33  ALT  --  13 17 21 27  37 36   Liver Function Tests:  Recent Labs Lab 06/05/14 0300 06/06/14 0230 06/07/14 0653  AST 31 36 33  ALT 27 37 36  ALKPHOS 81 83 79  BILITOT 0.4 0.4 0.7  PROT 5.6* 5.7* 5.9*  ALBUMIN 1.8* 1.9* 1.9*   No results for input(s): LIPASE, AMYLASE in the last 168 hours. No results for input(s): AMMONIA in the last 168 hours. CBC:  Recent Labs Lab 06/03/14 2100 06/04/14 0327 06/05/14 0300 06/06/14 0230 06/07/14 0653  WBC 8.1 9.3 8.0 6.7 PENDING  NEUTROABS 6.3 7.5 6.9 5.9 PENDING  HGB 10.1* 10.2* 10.0* 10.1* 10.9*  HCT 29.9* 30.7* 29.1* 29.3* 31.1*  MCV 86.2 86.7 85.1 84.2 83.8  PLT 174 191 204 239 PENDING    Cardiac Enzymes:  Recent Labs Lab 06/03/14 2100  CKTOTAL 205   CBG:  Recent Labs Lab 06/05/14 2144 06/06/14 0757 06/06/14 1222 06/06/14 1715 06/06/14 2312  GLUCAP 209* 187* 175* 255* 281*    Iron Studies: No results for input(s): IRON, TIBC, TRANSFERRIN, FERRITIN in the last 72 hours. Studies/Results: Nm Pulmonary Perf And Vent  06/05/2014   CLINICAL DATA:  Dyspnea. History of gout, diabetes, hypertension, stroke, heart failure.  EXAM: NUCLEAR MEDICINE VENTILATION - PERFUSION LUNG SCAN  TECHNIQUE: Ventilation images were obtained in multiple projections using inhaled aerosol technetium 99 M DTPA. Perfusion images were obtained in multiple projections after intravenous injection of Tc-19m MAA.  RADIOPHARMACEUTICALS:  40.0 mCi Tc-95m DTPA aerosol and 6.0 mCi Tc-19m MAA  COMPARISON:  Chest x-ray 06/03/2014  FINDINGS: Ventilation: No focal ventilation defect.  Perfusion: No wedge shaped peripheral perfusion defects to suggest acute pulmonary embolism.  IMPRESSION: Normal ventilation perfusion scan.   Electronically Signed   By: Nolon Nations M.D.   On: 06/05/2014 14:52   Dg Chest Port 1  View  06/06/2014   CLINICAL DATA:  74 year old male with congestive heart failure  EXAM: PORTABLE CHEST - 1 VIEW  COMPARISON:  Prior chest x-ray 06/03/2014  FINDINGS: Stable cardiomegaly with left heart prominence. Decreased interstitial pulmonary edema but persistent pulmonary vascular congestion. Left retrocardiac opacity remains unchanged. No large pleural effusion and no evidence of pneumothorax. No acute osseous abnormality.  IMPRESSION: 1. Resolving pulmonary interstitial edema with persistent vascular congestion. 2. Unchanged left retrocardiac opacity favored to reflect a combination of atelectasis and perhaps pleural effusion.   Electronically Signed   By: Jacqulynn Cadet M.D.   On: 06/06/2014 12:06   . antiseptic oral rinse  7 mL Mouth Rinse BID  . clopidogrel  75 mg Oral Daily  . heparin   5,000 Units Subcutaneous 3 times per day  . hydrALAZINE  5 mg Oral BID  . insulin aspart  0-5 Units Subcutaneous QHS  . insulin aspart  0-9 Units Subcutaneous TID WC  . ipratropium-albuterol  3 mL Nebulization TID  . isosorbide mononitrate  15 mg Oral Daily  . predniSONE  40 mg Oral Q breakfast  . pregabalin  75 mg Oral BID  . psyllium  1 packet Oral QHS  . sertraline  50 mg Oral QHS  . sodium chloride  3 mL Intravenous Q12H    BMET    Component Value Date/Time   NA 127* 06/07/2014 0653   K 5.5* 06/07/2014 0653   CL 92* 06/07/2014 0653   CO2 23 06/07/2014 0653   GLUCOSE 191* 06/07/2014 0653   BUN 126* 06/07/2014 0653   CREATININE 5.59* 06/07/2014 0653   CALCIUM 7.0* 06/07/2014 0653   GFRNONAA 9* 06/07/2014 0653   GFRAA 10* 06/07/2014 0653   CBC    Component Value Date/Time   WBC PENDING 06/07/2014 0653   RBC 3.71* 06/07/2014 0653   HGB 10.9* 06/07/2014 0653   HCT 31.1* 06/07/2014 0653   PLT PENDING 06/07/2014 0653   MCV 83.8 06/07/2014 0653   MCH 29.4 06/07/2014 0653   MCHC 35.0 06/07/2014 0653   RDW 12.7 06/07/2014 0653   LYMPHSABS PENDING 06/07/2014 0653   MONOABS PENDING 06/07/2014 0653   EOSABS PENDING 06/07/2014 0653   BASOSABS PENDING 06/07/2014 0653     Assessment/Plan:  1. AKI/CKD- in setting of decompensated CHF and hypotension.  Non-oliguric.  BUN rising out of proportion to Scr,presumably due to prednisone therapy. 2. Hyperkalemia- will try IV lasix 80mg  x 1 to see if this improves and recheck later today, if still elevated may need kayexalate and/or IV saline to improve distal Na delivery 3. Hyponatremia- as above, pt with edema, will follow with IV Lasix x 1 and recheck later today, check cortisol, tsh, UNa, Ucr, and follow. 4. Acute on chronic systolic CHF- +edema, weight increased, IV lasix and follow 5. Severe protein malnutrition 6. Proteinuria- non-nephrotic, due to underlying CKD stage 4 related to diabetes and HTN 7. Arthralgias/myalgias-  possible RA, also possible PMR, no significant improvement with steroids.  Agree with TEE to r/oSBE.  Also need to consider drug-induced lupus with hydralazine.  Will add anti-histone ab.  Triana A

## 2014-06-07 NOTE — Interval H&P Note (Signed)
History and Physical Interval Note:  06/07/2014 2:03 PM  Darrell Richardson  has presented today for surgery, with the diagnosis of r.o endocarditis  The various methods of treatment have been discussed with the patient and family. After consideration of risks, benefits and other options for treatment, the patient has consented to  Procedure(s): TRANSESOPHAGEAL ECHOCARDIOGRAM (TEE) (N/A) as a surgical intervention .  The patient's history has been reviewed, patient examined, no change in status, stable for surgery.  I have reviewed the patient's chart and labs.  Questions were answered to the patient's satisfaction.     Gu[place]ford Shannahan

## 2014-06-07 NOTE — CV Procedure (Signed)
    TRANSESOPHAGEAL ECHOCARDIOGRAM   NAME:  Darrell Richardson   MRN: [id number] DOB:  [birth year]   ADMIT DATE: 05/31/2014  INDICATIONS: Fever. ESR > 100. Rule out endocarditis  PROCEDURE:   Informed consent was obtained prior to the procedure. The risks, benefits and alternatives for the procedure were discussed and the patient comprehended these risks.  Risks include, but are not limited to, cough, sore throat, vomiting, nausea, somnolence, esophageal and stomach trauma or perforation, bleeding, low blood pressure, aspiration, pneumonia, infection, trauma to the teeth and death.    After a procedural time-out, the patient was given 3 mg versed and 50 mcg fentanyl for moderate sedation.  The oropharynx was anesthetized 10 cc of topical 1% viscous lidocaine.  The transesophageal probe was inserted in the esophagus and stomach without difficulty and multiple views were obtained.    COMPLICATIONS:    There were no immediate complications.  FINDINGS:  LEFT VENTRICLE: EF = 40-45%. Akinesis of the basilar to mid inferior and inferolateral walls  RIGHT VENTRICLE: Normal size and function.   LEFT ATRIUM: Dilated  LEFT ATRIAL APPENDAGE: Not visualized  RIGHT ATRIUM: Normal  AORTIC VALVE:  Trileaflet. Mildly thickened. No AI/AS. No vegetation.   MITRAL VALVE:    Normal. Mild MR. No vegetation.   TRICUSPID VALVE: Normal. Mild TR. No vegetation.   PULMONIC VALVE: Normal. No PI. No vegetation  INTERATRIAL SEPTUM: Moderate sized PFO with left to right shunting at rest.  PERICARDIUM: No effusion  DESCENDING AORTA: moderate to severe plaque   CONCLUSION:   No TEE evidence of endocarditis.   Adryan Shin,MD 2:21 PM

## 2014-06-07 NOTE — Progress Notes (Signed)
TEAM 1 - Stepdown/ICU TEAM Progress Note  Darrell Richardson VOZ:[id number] DOB: December 21, 1940 DOA: 05/31/2014 PCP: [name], MD  Admit HPI / Brief Narrative: 73 y.o. M Hx diabetes type II uncontrolled, CHF, HTN, CKD, and CVA w/ residual Lt Hemiparesis who presented to [facility] complaining of shortness of breath. He was found to be in acute respiratory failure secondary to an acute CHF exacerbation, placed on BiPAP, and sent to [facility] for further evaluation.  In the ED troponin was negative, creatinine 2.44, hemoglobin 13.3. BNP was 7363. Patient was given 1 dose of Lasix and sent for further evaluation.  HPI/Subjective: Pt is much more alert and in much less resp distress compared to my last visit 4-5 days ago.  He c/o "feeling bad in general" and specifically B ankle and knee aches, but denies cp, sob, n/v, or abdom pain, and admits he feels much better than at admit.    Assessment/Plan:  Fever / Arthralgias / myalgias -differential includes PMR v/s RA v/s hydralazine induced lupus - serum w/u underway  -resp viral panel negative for influenza or URI -ESR 135  -it is also possible that his overall feeling of unwellness is due to his BUN of 126, and that his arthralgias are due to his known gout  -he has not had a true fever since 2/24  Acute on chronic respiratory failure with hypoxia  -BIPAP now weaned to [place] O2 - much improved from resp standpoint   Acute renal failure on CKD stage III  Baseline crt 2.5-2.8 - renal fxn has worsened significantly - Nephrology now following  Hyperkalemia  -Neph is dosing w/ lasix x1 - follow trend - may require kayexelate  Hyponatremia -lasix today - appears volume overloaded on exam   Cardiomyopathy / 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 CHF -care as per CHF Team - CHF does not appear to be the cause of his resp sx -   Hypertension -BP currently stable   Diabetes mellitus type 2 uncontrolled -A1c  7.5 - CBG climbing w/ use of steroids - adjust tx and follow   Urinary retention  -empiric tx for presumed BPH    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 -LVEF= 40% to 45%. akinesis of inferolateral and inferior myocardium -grade 2 diastolic dysfunction - Left atrium: severely dilated - Pericardium, extracardiac: A small pericardial effusion was identified 2/29 TEE - pending   Antibiotics: Azithromycin 2/23 > 2/24 Vancomycin 2/23 > 2/24  DVT prophylaxis: Subcutaneous heparin  Objective: Blood pressure 124/68, pulse 72, temperature 97.3 F (36.3 C), temperature source Oral, resp. rate 18, height 5' 8"  (1.727 m), weight 111 kg (244 lb 11.4 oz), SpO2 95 %.  Intake/Output Summary (Last 24 hours) at 06/07/14 1000 Last data filed at 06/07/14 0800  Gross per 24 hour  Intake   1260 ml  Output   1445 ml  Net   -185 ml   Exam: General: alert and conversant - no acute resp distress  Lungs: Clear to auscultation bilaterally without wheezes or crackles Cardiovascular: Regular rate and rhythm without  murmur gallop or rub  Abdomen: Nontender, nondistended, soft, bowel sounds positive, no rebound, no ascites, no appreciable mass Extremities: No significant cyanosis, clubbing, or edema bilateral lower extremities - no joint erythema or effusions noted B LE   Data Reviewed: Basic Metabolic Panel:  Recent Labs Lab 06/03/14 2100 06/04/14 0327 06/05/14 0300 06/06/14 0230 06/07/14 0653  NA 131* 134* 132* 130* 127*  K 4.0 4.3 4.7 5.0 5.5*  CL 98  97 96 94* 92*  CO2 23 22 23 19 23   GLUCOSE 161* 191* 184* 204* 191*  BUN 59* 65* 84* 104* 126*  CREATININE 4.71* 5.01* 5.56* 5.71* 5.59*  CALCIUM 7.3* 7.5* 7.3* 7.3* 7.0*  MG  --  2.1 2.3 2.4 2.7*  PHOS 3.9  --   --   --   --    Liver Function Tests:  Recent Labs Lab 06/03/14 2100 06/04/14 0327 06/05/14 0300 06/06/14 0230 06/07/14 0653  AST 27 28 31  36 33  ALT 17 21 27  37 36  ALKPHOS 75 79 81 83 79  BILITOT 0.6 0.8 0.4 0.4 0.7  PROT 5.5* 5.8* 5.6* 5.7* 5.9*  ALBUMIN 1.9* 1.9* 1.8* 1.9* 1.9*    CBC:  Recent Labs Lab 06/03/14 2100 06/04/14 0327 06/05/14 0300 06/06/14 0230 06/07/14 0653  WBC 8.1 9.3 8.0 6.7 5.9  NEUTROABS 6.3 7.5 6.9 5.9 5.0  HGB 10.1* 10.2* 10.0* 10.1* 10.9*  HCT 29.9* 30.7* 29.1* 29.3* 31.1*  MCV 86.2 86.7 85.1 84.2 83.8  PLT 174 191 204 239 PLATELET CLUMPS NOTED ON SMEAR, COUNT APPEARS ADEQUATE   CBG:  Recent Labs Lab 06/06/14 0757 06/06/14 1222 06/06/14 1715 06/06/14 2312 06/07/14 0736  GLUCAP 187* 175* 255* 281* 178*    Recent Results (from the past 240 hour(s))  MRSA PCR Screening     Status: None   Collection Time: 05/31/14 10:55 AM  Result Value Ref Range Status   MRSA by PCR NEGATIVE NEGATIVE Final    Comment:        The GeneXpert MRSA Assay (FDA approved for NASAL specimens only), is one component of a comprehensive MRSA colonization surveillance program. It is not intended to diagnose MRSA infection nor to guide or monitor treatment for MRSA infections.   Respiratory virus panel (routine influenza)     Status: None   Collection Time: 06/01/14  9:01 PM  Result Value Ref Range Status   Source - RVPAN NASAL SWAB  Corrected   Respiratory Syncytial Virus A Negative Negative Final   Respiratory Syncytial Virus B Negative Negative Final   Influenza A Negative Negative Final   Influenza B Negative Negative Final   Parainfluenza 1 Negative Negative Final   Parainfluenza 2 Negative Negative Final   Parainfluenza 3 Negative  Negative Final   Metapneumovirus Negative Negative Final   Rhinovirus Negative Negative Final   Adenovirus Negative Negative Final    Comment: (NOTE) Performed At: [facility] [address] [place], [place] [id number] Lindon Romp MD DH:[id number]   Culture, blood (routine x 2)     Status: None (Preliminary result)   Collection Time: 06/01/14 10:33 PM  Result Value Ref Range Status   Specimen Description BLOOD RIGHT HAND  Final   Special Requests BOTTLES DRAWN AEROBIC ONLY 5CC  Final   Culture   Final           BLOOD CULTURE RECEIVED NO GROWTH TO DATE CULTURE WILL BE HELD FOR 5 DAYS BEFORE ISSUING A FINAL NEGATIVE REPORT Performed at [facility]  Report Status PENDING  Incomplete  Culture, blood (routine x 2)     Status: None (Preliminary result)   Collection Time: 06/01/14 10:45 PM  Result Value Ref Range Status   Specimen Description BLOOD LEFT HAND  Final   Special Requests BOTTLES DRAWN AEROBIC ONLY 4CC  Final   Culture   Final           BLOOD CULTURE RECEIVED NO GROWTH TO DATE CULTURE WILL BE HELD FOR 5 DAYS BEFORE ISSUING A FINAL NEGATIVE REPORT Performed at [facility]    Report Status PENDING  Incomplete    Studies:  Recent x-ray studies have been reviewed in detail by the Attending Physician  Scheduled Meds:  Scheduled Meds: . antiseptic oral rinse  7 mL Mouth Rinse BID  . clopidogrel  75 mg Oral Daily  . furosemide  80 mg Intravenous Once  . heparin  5,000 Units Subcutaneous 3 times per day  . hydrALAZINE  5 mg Oral BID  . insulin aspart  0-5 Units Subcutaneous QHS  . insulin aspart  0-9 Units Subcutaneous TID WC  . ipratropium-albuterol  3 mL Nebulization TID  . isosorbide mononitrate  15 mg Oral Daily  . methylPREDNISolone (SOLU-MEDROL) injection  125 mg Intravenous Q24H  . pregabalin  75 mg Oral BID  . psyllium  1 packet Oral QHS  . sertraline  50 mg Oral QHS  . sodium chloride  3 mL Intravenous Q12H    Time spent on  care of this patient: 35 mins  Cherene Altes, MD [facility] For Consults/Admissions - Flow Manager - [phone] Office  [phone]  Contact MD directly via text page:      [email or web address]      password [email or web address]  06/07/2014, 10:00 AM   LOS: 7 days

## 2014-06-08 ENCOUNTER — Encounter (HOSPITAL_COMMUNITY): Payer: Self-pay | Admitting: Internal Medicine

## 2014-06-08 DIAGNOSIS — M791 Myalgia, unspecified site: Secondary | ICD-10-CM | POA: Diagnosis present

## 2014-06-08 DIAGNOSIS — R5381 Other malaise: Secondary | ICD-10-CM

## 2014-06-08 DIAGNOSIS — R700 Elevated erythrocyte sedimentation rate: Secondary | ICD-10-CM

## 2014-06-08 DIAGNOSIS — M2550 Pain in unspecified joint: Secondary | ICD-10-CM

## 2014-06-08 LAB — RENAL FUNCTION PANEL
Albumin: 1.9 g/dL — ABNORMAL LOW (ref 3.5–5.2)
Anion gap: 15 (ref 5–15)
BUN: 140 mg/dL — AB (ref 6–23)
CALCIUM: 6.9 mg/dL — AB (ref 8.4–10.5)
CO2: 22 mmol/L (ref 19–32)
Chloride: 91 mmol/L — ABNORMAL LOW (ref 96–112)
Creatinine, Ser: 5.27 mg/dL — ABNORMAL HIGH (ref 0.50–1.35)
GFR calc Af Amer: 11 mL/min — ABNORMAL LOW (ref >90)
GFR, EST NON AFRICAN AMERICAN: 10 mL/min — AB (ref >90)
Glucose, Bld: 345 mg/dL — ABNORMAL HIGH (ref 70–99)
Phosphorus: 8 mg/dL — ABNORMAL HIGH (ref 2.3–4.6)
Potassium: 5.1 mmol/L (ref 3.5–5.1)
SODIUM: 128 mmol/L — AB (ref 135–145)

## 2014-06-08 LAB — GLUCOSE, CAPILLARY
GLUCOSE-CAPILLARY: 282 mg/dL — AB (ref 70–99)
Glucose-Capillary: 224 mg/dL — ABNORMAL HIGH (ref 70–99)
Glucose-Capillary: 422 mg/dL — ABNORMAL HIGH (ref 70–99)
Glucose-Capillary: 373 mg/dL — ABNORMAL HIGH (ref 70–99)

## 2014-06-08 LAB — CBC
HCT: 27.7 % — ABNORMAL LOW (ref 39.0–52.0)
HEMOGLOBIN: 9.6 g/dL — AB (ref 13.0–17.0)
MCH: 28.6 pg (ref 26.0–34.0)
MCHC: 34.7 g/dL (ref 30.0–36.0)
MCV: 82.4 fL (ref 78.0–100.0)
PLATELETS: 259 10*3/uL (ref 150–400)
RBC: 3.36 MIL/uL — AB (ref 4.22–5.81)
RDW: 12.4 % (ref 11.5–15.5)
WBC: 4.4 10*3/uL (ref 4.0–10.5)

## 2014-06-08 LAB — UIFE/LIGHT CHAINS/TP QN, 24-HR UR
ALPHA 2 UR: DETECTED — AB
Albumin, U: DETECTED
Alpha 1, Urine: DETECTED — AB
BETA UR: DETECTED — AB
Gamma Globulin, Urine: DETECTED — AB
Total Protein, Urine: 178 mg/dL — ABNORMAL HIGH (ref 5–25)

## 2014-06-08 LAB — RHEUMATOID FACTOR: Rhuematoid fact SerPl-aCnc: 12.4 IU/mL (ref 0.0–13.9)

## 2014-06-08 LAB — CULTURE, BLOOD (ROUTINE X 2)
Culture: NO GROWTH
Culture: NO GROWTH

## 2014-06-08 LAB — PARVOVIRUS B19 ANTIBODY, IGG AND IGM
PAROVIRUS B19 IGM ABS: 0.3 {index} (ref <0.9)
Parovirus B19 IgG Abs: 2.6 index — ABNORMAL HIGH (ref <0.9)

## 2014-06-08 LAB — PROTEIN ELECTROPHORESIS, SERUM
ALBUMIN ELP: 41.8 % — AB (ref 55.8–66.1)
Alpha-1-Globulin: 11.4 % — ABNORMAL HIGH (ref 2.9–4.9)
Alpha-2-Globulin: 22.6 % — ABNORMAL HIGH (ref 7.1–11.8)
BETA 2: 10.3 % — AB (ref 3.2–6.5)
BETA GLOBULIN: 5.6 % (ref 4.7–7.2)
GAMMA GLOBULIN: 8.3 % — AB (ref 11.1–18.8)
M-SPIKE, %: NOT DETECTED g/dL
Total Protein ELP: 5.1 g/dL — ABNORMAL LOW (ref 6.0–8.3)

## 2014-06-08 LAB — ANCA TITERS
C-ANCA: <1:20 {titer}
P-ANCA: <1:20 {titer}

## 2014-06-08 LAB — HISTONE ANTIBODIES, IGG, BLOOD: DNA-Histone: <1

## 2014-06-08 LAB — CORTISOL: CORTISOL PLASMA: 31.6 ug/dL

## 2014-06-08 MED ORDER — IPRATROPIUM-ALBUTEROL 0.5-2.5 (3) MG/3ML IN SOLN: 3.0000 mL | Freq: Four times a day (QID) | RESPIRATORY_TRACT | Status: DC | PRN

## 2014-06-08 MED ORDER — DOXYCYCLINE HYCLATE 100 MG PO TABS
100.0000 mg | ORAL_TABLET | Freq: Two times a day (BID) | ORAL | Status: DC
  Administered 2014-06-08 – 2014-06-11 (×7): 100 mg via ORAL
  Filled 2014-06-08 (×8): qty 1

## 2014-06-08 MED ORDER — INSULIN ASPART 100 UNIT/ML ~~LOC~~ SOLN
0.0000 [IU] | SUBCUTANEOUS | Status: DC
  Administered 2014-06-08: 5 [IU] via SUBCUTANEOUS
  Administered 2014-06-08: 8 [IU] via SUBCUTANEOUS

## 2014-06-08 MED ORDER — INSULIN GLARGINE 100 UNIT/ML ~~LOC~~ SOLN
10.0000 [IU] | Freq: Every day | SUBCUTANEOUS | Status: DC
  Administered 2014-06-08: 10 [IU] via SUBCUTANEOUS
  Filled 2014-06-08: qty 0.1

## 2014-06-08 MED ORDER — INSULIN ASPART 100 UNIT/ML ~~LOC~~ SOLN
0.0000 [IU] | SUBCUTANEOUS | Status: DC
  Administered 2014-06-08: 20 [IU] via SUBCUTANEOUS
  Administered 2014-06-08: 25 [IU] via SUBCUTANEOUS
  Administered 2014-06-09: 11 [IU] via SUBCUTANEOUS
  Administered 2014-06-09 (×2): 4 [IU] via SUBCUTANEOUS

## 2014-06-08 MED ORDER — INSULIN GLARGINE 100 UNIT/ML ~~LOC~~ SOLN
15.0000 [IU] | Freq: Every day | SUBCUTANEOUS | Status: DC
  Filled 2014-06-08: qty 0.15

## 2014-06-08 NOTE — Progress Notes (Signed)
Advanced Heart Failure Rounding Note   Subjective:    Afebrile. Prednisone switched to solumedrol yesterday. Feeling better. Still a bit sore in ankles. Has not walked yet.   TEE negative for endocarditis 2/29. EF 40-45%. Renal function starting to recover. Renal restarted diuretics. Urine output picking up.  Rheumatoid factor in normal range. Potassium improved.   Objective:   Weight Range:  Vital Signs:   Temp:  [97.3 F (36.3 C)-97.6 F (36.4 C)] 97.4 F (36.3 C) (03/01 0400) Pulse Rate:  [61-78] 77 (03/01 0400) Resp:  [10-22] 21 (03/01 0400) BP: (105-157)/(25-107) 132/66 mmHg (03/01 0400) SpO2:  [89 %-100 %] 91 % (03/01 0400) Weight:  [109.7 kg (241 lb 13.5 oz)] 109.7 kg (241 lb 13.5 oz) (03/01 0500) Last BM Date: 06/04/14  Weight change: Filed Weights   06/06/14 0400 06/07/14 0500 06/08/14 0500  Weight: 110.5 kg (243 lb 9.7 oz) 111 kg (244 lb 11.4 oz) 109.7 kg (241 lb 13.5 oz)    Intake/Output:   Intake/Output Summary (Last 24 hours) at 06/08/14 0546 Last data filed at 06/08/14 0000  Gross per 24 hour  Intake   1230 ml  Output   2560 ml  Net  -1330 ml     Physical Exam: General: Elderly lying in bed .No dyspnea HEENT: normal Neck: supple. JVP hard to see. Carotids 2+ bilat; no bruits. No lymphadenopathy or thryomegaly appreciated. Cor: PMI nondisplaced. Regular rate & rhythm. No rubs, gallops or murmurs. No S3.  Lungs: crackles in bases on 4 liters [place] Abdomen: soft, nontender, nondistended. No hepatosplenomegaly. No bruits or masses. Good bowel sounds. Extremities no cyanosis, clubbing, rash, R and LLE 1+edema. DPs trace bialterally Neuro: alert & orientedx3, cranial nerves grossly intact. moves all 4 extremities w/o difficulty. Affect pleasant  Telemetry:  SR   Labs: Basic Metabolic Panel:  Recent Labs Lab 06/03/14 2100 06/04/14 0327 06/05/14 0300 06/06/14 0230 06/07/14 0653 06/07/14 1820 06/08/14 0205  NA 131* 134* 132* 130* 127* 126* 128*   K 4.0 4.3 4.7 5.0 5.5* 5.6* 5.1  CL 98 97 96 94* 92* 93* 91*  CO2 _0 GLUCOSE 161* 191* 184* 204* 191* 336* 345*  BUN 59* 65* 84* 104* 126* 133* 140*  CREATININE 4.71* 5.01* 5.56* 5.71* 5.59* 5.37* 5.27*  CALCIUM 7.3* 7.5* 7.3* 7.3* 7.0* 6.8* 6.9*  MG  --  2.1 2.3 2.4 2.7*  --   --   PHOS 3.9  --   --   --   --   --  8.0*    Liver Function Tests:  Recent Labs Lab 06/03/14 2100 06/04/14 0327 06/05/14 0300 06/06/14 0230 06/07/14 0653 06/08/14 0205  AST _1 36 33  --   ALT _2 37 36  --   ALKPHOS 75 79 81 83 79  --   BILITOT 0.6 0.8 0.4 0.4 0.7  --   PROT 5.5* 5.8* 5.6* 5.7* 5.9*  --   ALBUMIN 1.9* 1.9* 1.8* 1.9* 1.9* 1.9*   No results for input(s): LIPASE, AMYLASE in the last 168 hours. No results for input(s): AMMONIA in the last 168 hours.  CBC:  Recent Labs Lab 06/03/14 2100 06/04/14 0327 06/05/14 0300 06/06/14 0230 06/07/14 0653 06/08/14 0205  WBC 8.1 9.3 8.0 6.7 5.9 4.4  NEUTROABS 6.3 7.5 6.9 5.9 5.0  --   HGB 10.1* 10.2* 10.0* 10.1* 10.9* 9.6*  HCT 29.9* 30.7* 29.1* 29.3* 31.1* 27.7*  MCV 86.2 86.7 85.1 84.2 83.8  82.4  PLT 174 191 204 239 PLATELET CLUMPS NOTED ON SMEAR, COUNT APPEARS ADEQUATE 259    Cardiac Enzymes:  Recent Labs Lab 06/03/14 2100  CKTOTAL 205    BNP: BNP (last 3 results) No results for input(s): BNP in the last 8760 hours.  ProBNP (last 3 results) No results for input(s): PROBNP in the last 8760 hours.    Other results:   Imaging: Dg Chest Port 1 View  06/06/2014   CLINICAL DATA:  74 year old male with congestive heart failure  EXAM: PORTABLE CHEST - 1 VIEW  COMPARISON:  Prior chest x-ray 06/03/2014  FINDINGS: Stable cardiomegaly with left heart prominence. Decreased interstitial pulmonary edema but persistent pulmonary vascular congestion. Left retrocardiac opacity remains unchanged. No large pleural effusion and no evidence of pneumothorax. No acute osseous abnormality.  IMPRESSION: 1.  Resolving pulmonary interstitial edema with persistent vascular congestion. 2. Unchanged left retrocardiac opacity favored to reflect a combination of atelectasis and perhaps pleural effusion.   Electronically Signed   By: Jacqulynn Cadet M.D.   On: 06/06/2014 12:06     Medications:     Scheduled Medications: . antiseptic oral rinse  7 mL Mouth Rinse BID  . clopidogrel  75 mg Oral Daily  . heparin  5,000 Units Subcutaneous 3 times per day  . hydrALAZINE  5 mg Oral BID  . insulin aspart  0-5 Units Subcutaneous QHS  . insulin aspart  0-9 Units Subcutaneous TID WC  . insulin glargine  10 Units Subcutaneous QHS  . ipratropium-albuterol  3 mL Nebulization TID  . isosorbide mononitrate  15 mg Oral Daily  . methylPREDNISolone (SOLU-MEDROL) injection  125 mg Intravenous Q24H  . pregabalin  75 mg Oral Daily  . psyllium  1 packet Oral QHS  . sertraline  50 mg Oral QHS  . sodium chloride  3 mL Intravenous Q12H    Infusions:    PRN Medications: acetaminophen **OR** acetaminophen, diphenhydrAMINE, HYDROcodone-acetaminophen, hydrocortisone cream, ipratropium-albuterol, morphine injection, ondansetron (ZOFRAN) IV, oxymetazoline   Assessment:   1. A/C Systolic HF    --EF 70-01% 2. Acute Respiratory Failure- on Bipap now weaned to nasal cannula.  3. Hypertensive crisis 4. DM 5. Acute on CKD, stage IV - Creatinine baseline 2.5 6. 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 Chest Pain 1 week ago 8. Fever - ? Acute bronchitis vs flu 9. Acute gout - uric acid 8.3 10. Hyponatremia 11. Joint/muscle aches with ESR 135    -CK, RF, complement, ASO, blood cx negative.     - ANA, ANCA, lyme titers, SPEP/UPEP, CCP, parvo B19 (IgM) pending   Plan/Discussion:    Stable from HF perspective. Renal function improving and starting to diurese.   On going work-up for fevers, severe joint and muscle aches with ESR 135. I spoke with Dr. Ouida Sills in Rheumatology about differential including: PMR, myeloma, vasculitis,  dermatomyositis, endocarditis, late-onset RA, lyme diseaseetc. TEE negative for endocarditis. RF negative. Other serologies pending. Will also send titers for RMSF (he has had before) and ehrlichiosis. Will start empiric doxy for now. May be worth ID consult to help sort out.  Continue solumedrol for at least 3 days. Await serologic work-up. CIR consult in progress.   Daniel Bensimhon,MD 5:46 AM

## 2014-06-08 NOTE — Progress Notes (Signed)
Physical Therapy Treatment Patient Details Name: Darrell Richardson MRN: [id number] DOB: [birth year] Today's Date: 06/08/2014    History of Present Illness 74 y.o. M Hx diabetes type II uncontrolled, CHF, HTN, CKD, and CVA w/ residual Lt Hemiparesis who presented to [facility] complaining of shortness of breath. He was found to be in acute respiratory failure secondary to an acute CHF exacerbation, placed on BiPAP and sent to [facility] for further evaluation.    PT Comments    Pt making slow progress. Remains weak with significant mobility deficits.  Follow Up Recommendations  CIR     Equipment Recommendations  Other (comment) (to be determined)    Recommendations for Other Services       Precautions / Restrictions Precautions Precautions: Fall    Mobility  Bed Mobility Overal bed mobility: Needs Assistance Bed Mobility: Supine to Sit     Supine to sit: +2 for physical assistance;Mod assist     General bed mobility comments: Assist to elevate trunk and bring hips to EOB.  Transfers Overall transfer level: Needs assistance Equipment used: Rolling walker (2 wheeled);Ambulation equipment used Clarise Cruz Plus) Transfers: Sit to/from [facility] Sit to Stand: +2 physical assistance;Total assist Stand pivot transfers: +2 physical assistance;Total assist       General transfer comment: Unable to come to standing with rolling walker and 2 person assist. Then used Clarise Cruz Plus for standing and pivot to chair. Even with Clarise Cruz Plus pt with difficulty extending hips and hips and knees flexing during pivoting of Sara Plus.  Ambulation/Gait                 Stairs            Wheelchair Mobility    Modified Rankin (Stroke Patients Only)       Balance Overall balance assessment: Needs assistance Sitting-balance support: Bilateral upper extremity supported;Feet supported Sitting balance-Leahy Scale: Poor Sitting balance - Comments:  Initially pt with significant posterior lean requiring mod A to maintain balance. Pt unable to correct posterior lean even with verbal/tactile cues. Placed bedside table in front of pt and had him prop his forearms on table to facilitate anterior wt shift. Pt able to maintain this position with supervision. After 5 minutes removed table to and pt able to maintain static sitting posture with  upper extremity support with supervision.   Standing balance support: Bilateral upper extremity supported Standing balance-Leahy Scale: Zero Standing balance comment: Clarise Cruz Plus                    Cognition Arousal/Alertness: Awake/alert Behavior During Therapy: WFL for tasks assessed/performed;Anxious Overall Cognitive Status: Within Functional Limits for tasks assessed                      Exercises      General Comments        Pertinent Vitals/Pain Pain Assessment: Faces Faces Pain Scale: Hurts even more Pain Location: bilateral feet and legs with weight bearing and moving Pain Descriptors / Indicators: Grimacing Pain Intervention(s): Limited activity within patient's tolerance;Monitored during session;Repositioned    Home Living                      Prior Function            PT Goals (current goals can now be found in the care plan section) Progress towards PT goals: Progressing toward goals    Frequency  Min 3X/week  PT Plan Current plan remains appropriate    Co-evaluation             End of Session Equipment Utilized During Treatment: Gait belt;Other (comment) Clarise Cruz Plus) Activity Tolerance: Patient tolerated treatment well Patient left: in chair;with call bell/phone within reach;with family/visitor present     Time: 4627-0350 PT Time Calculation (min) (ACUTE ONLY): 34 min  Charges:  $Gait Training: 23-37 mins                    G Codes:      Saburo Luger 07/05/2014, 11:13 AM  [employer] PT [phone]

## 2014-06-08 NOTE — Progress Notes (Signed)
[facility] TEAM 1 - Stepdown/ICU TEAM Progress Note  Darrell Richardson NKN:[id number] DOB: 1940-12-07 DOA: 05/31/2014 PCP: [name], MD  Admit HPI / Brief Narrative: Darrell Richardson is a 74 y.o. WM PMHx  diabetes type II uncontrolled, CHF, HTN, CKD and CVA Residual Lt Hemiparesis came in to [facility] complaining about shortness of breath. They don't have beds and stepdown so they transferred him to [facility] for further medical evaluation. This gentleman gets his cardiac care through her clinic, since last night he was complaining about shortness of breath, orthopnea, increased lower extremity edema he couldn't sleep very well so he came into the hospital in a.m. Was found to be in acute respiratory failure secondary to acute CHF exacerbation, placed on BiPAP and sent to [facility] for further evaluation. In the ED troponin was negative, creatinine 2.44, hemoglobin 13.3. BNP is 7363 (reference value 0-125 pg/mL). Patient was given 1 dose of Lasix and sent for further evaluation.   HPI/Subjective: 3/1 A/O x 4, although patient's renal function slightly improved, states emphatically he does not want HD. Has agreed to speak with palliative care.     Assessment/Plan: Fever / Arthralgias / myalgias -differential includes PMR v/s RA v/s hydralazine induced lupus - serum w/u underway  -resp viral panel negative for influenza or URI -ESR 135; prednisone DC'd, and Solu-Medrol daily started for possible PMR -it is also possible that his overall feeling of unwellness is due to his BUN of 126, and that his arthralgias are due to his known gout  -BUN continues to rise although creatinine trended down slightly. 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 -Continue BiPAP.PRN -Strict In and Out; Since Admission -1.4 L  - Daily Am Weight;Admission wt= 108.3 Kg             3/1 bed wt= 109.7 Kg -Losartan held because of worsening creatinine.  Acute on chronic respiratory failure with hypoxia/bronchitis? - Positive fever on Admission, negative leukocytosis, bands or left shift. Continues spike fevers.  -Continued SOB. Patient not on O2 at home. -VQ scan; normal  Hpertension -Continue Hydralazine to 5 mg BID  -Continue Imdur 15 mg daily -Patient's BP has improved,   Diabetes mellitus type 2 uncontrolled -2/22 hemoglobin A1c = 7.5 . -Continue resistant SSI . -Lantus 15 units QHS   Acute on chronic renal failure  (baseline 2.5 and 2.8),  -3/1 Currently Cr decreasing = 5.27,  -Improvement in creatinine most likely secondary to improved BP  -See hypertension  History of CVA -Residual left hemiparesis. Per wife did not attend inpatient rehabilitation  -PT recommends CIR   -OT evaluation pending -This is stable, patient is on Plavix, continued.  Gout -Uric acid= 8.3, goal <6  -See myalgias/arthralgias above -Secondary to patient's worsening renal function will DC allopurinol, in addition can exacerbate an acute flare.     Code Status: FULL Family Communication:  family present at time of exam Disposition Plan: Per cardiology and nephrology    Consultants: Dr.Daniel R Bensimhon (cardiology heart failure team) Dr.James L Deterding (nephrology)    Procedure/Significant Events: 2/22 echocardiogram;- Left ventricle: mild LVH. -LVEF= 40% to 45%. akinesis of inferolateral and inferior myocardium.  -(grade 2 diastolic dysfunction).  - Left atrium: severely  dilated. - Pericardium, extracardiac: A small pericardial effusion was identified.  2/22 PCXR;Congestive heart failure. 2/22 renal ultrasound;Marland Kitchen No hydronephrosis.- Small bilateral renal cysts. Borderline increased  parenchymal echogenicity right kidney. -Bilateral, Lt >>Rt  renal cortical thinning. 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   Culture 2/22 MRSA by PCR negative 2/23 sputum pending 2/23 respiratory virus panel negative 2/23 blood right/left hand NGTD   Antibiotics: Azithromycin 2/23>> stopped 2/24 Vancomycin 2/24>> stopped 2/24 Tamiflu 2/24>>   DVT prophylaxis: Subcutaneous heparin   Devices NA   LINES / TUBES:  NA    Continuous Infusions:    Objective: VITAL SIGNS: Temp: 97.3 F (36.3 C) (03/01 1628) Temp Source: Oral (03/01 1628) BP: 141/68 mmHg (03/01 1628) Pulse Rate: 70 (03/01 1628) SPO2; FIO2:   Intake/Output Summary (Last 24 hours) at 06/08/14 2149 Last data filed at 06/08/14 1800  Gross per 24 hour  Intake    520 ml  Output   2275 ml  Net  -1755 ml     Exam: General A/O x 4, joking with staff and family, No acute respiratory distress Lungs: Clear to auscultation bilaterally without wheezes or crackles Cardiovascular:  Regular rhythm and rate without murmur gallop or rub normal S1 and S2 Abdomen: Obese, Nontender, nondistended, soft, bowel sounds positive, no rebound, no ascites, no appreciable mass Extremities: No significant cyanosis, clubbing, swellling bilat hands resolved , erythema resolved, bilateral lower extremities edema decreased, erythematous rash bilateral calves resolved. tenderness in feet resolved Neurologic; within normal limit   Data Reviewed: Basic Metabolic Panel:  Recent Labs Lab 06/03/14 2100 06/04/14 0327 06/05/14 0300 06/06/14 0230 06/07/14 0653 06/07/14 1820 06/08/14 0205  NA 131* 134* 132* 130* 127* 126* 128*  K 4.0 4.3 4.7 5.0 5.5* 5.6* 5.1  CL 98 97 96 94* 92* 93* 91*  CO2 _0 GLUCOSE 161* 191* 184* 204* 191* 336* 345*  BUN 59* 65* 84* 104* 126* 133* 140*  CREATININE 4.71* 5.01* 5.56* 5.71* 5.59* 5.37* 5.27*  CALCIUM 7.3* 7.5* 7.3* 7.3* 7.0* 6.8* 6.9*  MG  --  2.1 2.3  2.4 2.7*  --   --   PHOS 3.9  --   --   --   --   --  8.0*   Liver Function Tests:  Recent Labs Lab 06/03/14 2100 06/04/14 0327 06/05/14 0300 06/06/14 0230 06/07/14 0653 06/08/14 0205  AST _1 36 33  --   ALT _2 37 36  --   ALKPHOS 75 79 81 83 79  --   BILITOT 0.6 0.8 0.4 0.4 0.7  --   PROT 5.5* 5.8* 5.6* 5.7* 5.9*  --   ALBUMIN 1.9* 1.9* 1.8* 1.9* 1.9* 1.9*   No results for input(s): LIPASE, AMYLASE in the last 168 hours. No results for input(s): AMMONIA in the last 168 hours. CBC:  Recent Labs Lab 06/03/14 2100 06/04/14 0327 06/05/14 0300 06/06/14 0230 06/07/14 0653 06/08/14 0205  WBC 8.1 9.3 8.0 6.7 5.9 4.4  NEUTROABS 6.3 7.5 6.9 5.9 5.0  --   HGB 10.1* 10.2* 10.0* 10.1* 10.9* 9.6*  HCT 29.9* 30.7* 29.1* 29.3* 31.1* 27.7*  MCV 86.2 86.7 85.1 84.2 83.8 82.4  PLT 174 191 204 239 PLATELET CLUMPS NOTED ON SMEAR, COUNT APPEARS ADEQUATE 259   Cardiac Enzymes:  Recent Labs Lab 06/03/14 2100  CKTOTAL 205   BNP (last 3 results) No results for input(s): BNP in the last 8760 hours.  ProBNP (last 3 results)  No results for input(s): PROBNP in the last 8760 hours.  CBG:  Recent Labs Lab 06/07/14 2054 06/08/14 0755 06/08/14 1109 06/08/14 1631 06/08/14 2025  GLUCAP 359* 224* 282* 422* 373*    Recent Results (from the past 240 hour(s))  MRSA PCR Screening     Status: None   Collection Time: 05/31/14 10:55 AM  Result Value Ref Range Status   MRSA by PCR NEGATIVE NEGATIVE Final    Comment:        The GeneXpert MRSA Assay (FDA approved for NASAL specimens only), is one component of a comprehensive MRSA colonization surveillance program. It is not intended to diagnose MRSA infection nor to guide or monitor treatment for MRSA infections.   Respiratory virus panel (routine influenza)     Status: None   Collection Time: 06/01/14  9:01 PM  Result Value Ref Range Status   Source - RVPAN NASAL SWAB  Corrected   Respiratory Syncytial Virus A  Negative Negative Final   Respiratory Syncytial Virus B Negative Negative Final   Influenza A Negative Negative Final   Influenza B Negative Negative Final   Parainfluenza 1 Negative Negative Final   Parainfluenza 2 Negative Negative Final   Parainfluenza 3 Negative Negative Final   Metapneumovirus Negative Negative Final   Rhinovirus Negative Negative Final   Adenovirus Negative Negative Final    Comment: (NOTE) Performed At: [facility] [address] [place], [place] [id number] Lindon Romp MD WY:[id number]   Culture, blood (routine x 2)     Status: None   Collection Time: 06/01/14 10:33 PM  Result Value Ref Range Status   Specimen Description BLOOD RIGHT HAND  Final   Special Requests BOTTLES DRAWN AEROBIC ONLY 5CC  Final   Culture   Final    NO GROWTH 5 DAYS Performed at [facility]    Report Status 06/08/2014 FINAL  Final  Culture, blood (routine x 2)     Status: None   Collection Time: 06/01/14 10:45 PM  Result Value Ref Range Status   Specimen Description BLOOD LEFT HAND  Final   Special Requests BOTTLES DRAWN AEROBIC ONLY 4CC  Final   Culture   Final    NO GROWTH 5 DAYS Performed at [facility]    Report Status 06/08/2014 FINAL  Final     Studies:  Recent x-ray studies have been reviewed in detail by the Attending Physician  Scheduled Meds:  Scheduled Meds: . antiseptic oral rinse  7 mL Mouth Rinse BID  . clopidogrel  75 mg Oral Daily  . doxycycline  100 mg Oral Q12H  . heparin  5,000 Units Subcutaneous 3 times per day  . hydrALAZINE  5 mg Oral BID  . insulin aspart  0-20 Units Subcutaneous 6 times per day  . [START ON 06/09/2014] insulin glargine  15 Units Subcutaneous Daily  . isosorbide mononitrate  15 mg Oral Daily  . methylPREDNISolone (SOLU-MEDROL) injection  125 mg Intravenous Q24H  . pregabalin  75 mg Oral Daily  . psyllium  1 packet Oral QHS  . sertraline  50 mg Oral QHS  . sodium chloride  3 mL Intravenous Q12H     Time spent on care of this patient: 40 mins   Allie Bossier [facility]  [facility] Office  [phone] Pager - [phone]  On-Call/Text Page:      [email or web address]      password [email or web address]  If 7PM-7AM, please contact night-coverage [email or web address] Password [facility] 06/08/2014, 9:49 PM  LOS: 8 days   Care during the described time interval was provided by me .  I have reviewed this patient's available data, including medical history, events of note, physical examination, radiology studies and test results as part of my evaluation  Dia Crawford, MD [phone] Pager

## 2014-06-08 NOTE — Consult Note (Signed)
Physical Medicine and Rehabilitation Consult Reason for Consult: Multi-medical/congestive heart failure/question RA versus PMR history of CVA Referring Physician: [facility]ad   HPI: Darrell Richardson is a 74 y.o. right handed male with history of CVA with residual left hemiparesis, diabetes mellitus peripheral neuropathy, gout, diastolic congestive heart failure, chronic renal insufficiency 2.44. Lives with his wife and used a cane prior to admission. Presented to [facility] 05/31/2014 with increasing nonspecific chest pain and shortness of breath and transferred to [facility] for further medical evaluation. Complaints of increased shortness of breath and lower extremity edema. He was found to have acute respiratory failure secondary to acute CHF exacerbation placed on BiPAP. Troponin was negative. BNP 7363 Patient placed on Lasix. Echocardiogram with ejection fraction of 45% with akinesis of the inferior lateral and inferior myocardium. Progressive increase in creatinine function from baseline 2.4-3.9. Renal service is consulted. Renal ultrasound showed no hydronephrosis. Complaints of joint pain low-grade fever with ESR 135 as well as uric acid level 8.3. Patient had been placed on prednisone therapy with workup concerning for PMR versus RA. It was felt prednisone was inducing increased renal function. TEE completed 06/07/2014 showing no vegetation moderate sized PFO with left-to-right shunting at rest. Full workups remain underway. Subcutaneous heparin for DVT prophylaxis. Physical therapy evaluation completed 06/05/2014 with recommendations of physical medicine rehabilitation consult.   Review of Systems  HENT: Positive for hearing loss.   Respiratory: Positive for shortness of breath.   Cardiovascular: Positive for leg swelling.  Gastrointestinal: Positive for constipation.  Musculoskeletal: Positive for myalgias and joint pain.  Psychiatric/Behavioral: Positive for  depression.  All other systems reviewed and are negative.  Past Medical History  Diagnosis Date  . Hearing loss   . Gout   . Diabetes   . Fatigue   . Hypertension   . Stroke     X 2  . Heart failure   . Muscle pain   . Hammertoe    Past Surgical History  Procedure Laterality Date  . Stomach surgery    . Foot surgery Right    Family History  Problem Relation Age of Onset  . Dementia Mother    Social History:  reports that he has quit smoking. He does not have any smokeless tobacco history on file. He reports that he drinks alcohol. He reports that he does not use illicit drugs. Allergies:  Allergies  Allergen Reactions  . Cortisone Anaphylaxis and Other (See Comments)    Cold, elevated BP, shaking all over and fever  Wife says tolerates hydrocortisone cream at home  . Ambien [Zolpidem Tartrate]     Did not tolerate well or remembered anything from previous night  . Rocephin [Ceftriaxone] Hives  . Levaquin [Levofloxacin In D5w] Rash   Medications Prior to Admission  Medication Sig Dispense Refill  . albuterol (PROAIR HFA) 108 (90 BASE) MCG/ACT inhaler Inhale 2 puffs into the lungs every 4 (four) hours as needed for wheezing or shortness of breath. Inhale 1-2 puffs four times daily as needed    . allopurinol (ZYLOPRIM) 100 MG tablet Take 100 mg by mouth 2 (two) times daily.    Marland Kitchen amLODipine (NORVASC) 5 MG tablet Take 5 mg by mouth 2 (two) times daily.    Marland Kitchen atorvastatin (LIPITOR) 20 MG tablet Take 40 mg by mouth daily.     . Cholecalciferol (VITAMIN D3) 1000 UNITS CAPS Take 1,000 Units by mouth daily.    . clopidogrel (PLAVIX) 75 MG tablet Take  75 mg by mouth daily with breakfast.    . docusate sodium (COLACE) 100 MG capsule Take 100 mg by mouth at bedtime.    . furosemide (LASIX) 80 MG tablet Take 80 mg by mouth daily.    Marland Kitchen HYDROcodone-acetaminophen (VICODIN ES) 7.5-750 MG per tablet Take 1 tablet by mouth every 6 (six) hours as needed for pain. Take 1/2 - 1 four times a  day as needed    . losartan (COZAAR) 50 MG tablet Take 50 mg by mouth daily.    . metoprolol succinate (TOPROL-XL) 25 MG 24 hr tablet Take 25 mg by mouth 2 (two) times daily.     . Multiple Vitamin (MULTIVITAMIN) tablet Take 1 tablet by mouth daily.    . pregabalin (LYRICA) 75 MG capsule Take 75 mg by mouth 2 (two) times daily.    . Psyllium (METAMUCIL PO) Take by mouth at bedtime.    . sertraline (ZOLOFT) 50 MG tablet Take 50 mg by mouth at bedtime.    . sitaGLIPtin (JANUVIA) 50 MG tablet Take 50 mg by mouth daily.    . vitamin E 200 UNIT capsule Take 200 Units by mouth daily.      Home: Home Living Family/patient expects to be discharged to:: Private residence Living Arrangements: Spouse/significant other Available Help at Discharge: Family Type of Home: House Home Access: [place]: One [place]: Environmental consultant - 2 wheels, Environmental consultant - 4 wheels, [place] - quad, [place] - single point, Civil engineer, contracting, Grab bars - toilet  Functional History: Prior Function Level of Independence: Independent Comments: occassional use of cane/walker if bad day Functional Status:  Mobility: Bed Mobility Overal bed mobility: Needs Assistance, +2 for physical assistance Bed Mobility: Supine to Sit Supine to sit: Max assist, +2 for physical assistance General bed mobility comments: ised pillow to support BLE (feet) during transitional movements secondary to pain gfrom gout, increased physical assist to elevate trunk to EOB due to stiffness and pain. Transfers Overall transfer level: Needs assistance Equipment used: 2 person hand held assist (face to face with chuck pad and gait belt) Transfers: Sit to/from Stand, Stand Pivot Transfers Sit to Stand: Max assist, +2 physical assistance Stand pivot transfers: Total assist, +2 physical assistance General transfer comment: Patient with poor physical strenghth and inability to bear weight through BLEs secondary to pain. Ambulation/Gait General  Gait Details: unable to perform    ADL:    Cognition: Cognition Overall Cognitive Status: Within Functional Limits for tasks assessed Orientation Level: Oriented X4 Cognition Arousal/Alertness: Awake/alert Behavior During Therapy: WFL for tasks assessed/performed, Anxious Overall Cognitive Status: Within Functional Limits for tasks assessed  Blood pressure 132/66, pulse 77, temperature 97.4 F (36.3 C), temperature source Oral, resp. rate 21, height _0  (1.727 m), weight 109.7 kg (241 lb 13.5 oz), SpO2 91 %. Physical Exam  Constitutional: He is oriented to person, place, and time.  HENT:  Head: Normocephalic.  Eyes: EOM are normal.  Neck: Normal range of motion. Neck supple. No thyromegaly present.  Cardiovascular:  Cardiac rate controlled  Respiratory:  Good inspiratory effort clear to auscultation  GI: Soft. Bowel sounds are normal. He exhibits no distension.  Musculoskeletal:  Weak grip due to pain in fingers, swelling. Mild swelling at knees and bilateral ankles with associated mild erythema and warmth. Has pain with AROM and PROm of both knees and ankles.   Neurological: He is alert and oriented to person, place, and time.  Moves all 4's. Strength and ROM limited due to  pain. UE's 3+ to 4 delt,bicep,tricep, grip 3. LE: 2+ to 3- HF, KE and 3/5 ankles. No sensory changes  Skin: Skin is warm and dry.  Psychiatric: He has a normal mood and affect. His behavior is normal.    Results for orders placed or performed during the hospital encounter of 05/31/14 (from the past 24 hour(s))  Comprehensive metabolic panel     Status: Abnormal   Collection Time: 06/07/14  6:53 AM  Result Value Ref Range   Sodium 127 (L) 135 - 145 mmol/L   Potassium 5.5 (H) 3.5 - 5.1 mmol/L   Chloride 92 (L) 96 - 112 mmol/L   CO2 23 19 - 32 mmol/L   Glucose, Bld 191 (H) 70 - 99 mg/dL   BUN 126 (H) 6 - 23 mg/dL   Creatinine, Ser 5.59 (H) 0.50 - 1.35 mg/dL   Calcium 7.0 (L) 8.4 - 10.5 mg/dL   Total  Protein 5.9 (L) 6.0 - 8.3 g/dL   Albumin 1.9 (L) 3.5 - 5.2 g/dL   AST 33 0 - 37 U/L   ALT 36 0 - 53 U/L   Alkaline Phosphatase 79 39 - 117 U/L   Total Bilirubin 0.7 0.3 - 1.2 mg/dL   GFR calc non Af Amer 9 (L) >90 mL/min   GFR calc Af Amer 10 (L) >90 mL/min   Anion gap 12 5 - 15  Magnesium     Status: Abnormal   Collection Time: 06/07/14  6:53 AM  Result Value Ref Range   Magnesium 2.7 (H) 1.5 - 2.5 mg/dL  CBC with Differential/Platelet     Status: Abnormal   Collection Time: 06/07/14  6:53 AM  Result Value Ref Range   WBC 5.9 4.0 - 10.5 K/uL   RBC 3.71 (L) 4.22 - 5.81 MIL/uL   Hemoglobin 10.9 (L) 13.0 - 17.0 g/dL   HCT 31.1 (L) 39.0 - 52.0 %   MCV 83.8 78.0 - 100.0 fL   MCH 29.4 26.0 - 34.0 pg   MCHC 35.0 30.0 - 36.0 g/dL   RDW 12.7 11.5 - 15.5 %   Platelets  150 - 400 K/uL    PLATELET CLUMPS NOTED ON SMEAR, COUNT APPEARS ADEQUATE   Neutro Abs 5.0 1.7 - 7.7 K/uL   Lymphs Abs 0.3 (L) 0.7 - 4.0 K/uL   Monocytes Absolute 0.6 0.1 - 1.0 K/uL   Eosinophils Absolute 0.0 0.0 - 0.7 K/uL   Basophils Absolute 0.0 0.0 - 0.1 K/uL   Neutrophils Relative % 85 (H) 43 - 77 %   Lymphocytes Relative 6 (L) 12 - 46 %   Monocytes Relative 9 3 - 12 %   Eosinophils Relative 0 0 - 5 %   Basophils Relative 0 0 - 1 %   RBC Morphology POLYCHROMASIA PRESENT   Glucose, capillary     Status: Abnormal   Collection Time: 06/07/14  7:36 AM  Result Value Ref Range   Glucose-Capillary 178 (H) 70 - 99 mg/dL   Comment 1 Notify RN   B. burgdorfi antibodies by WB *Canceled*     Status: None ()   Collection Time: 06/07/14  8:38 AM   Narrative   LIS Cancel (ORR/DE = Data Error)  Creatinine, urine, random     Status: None   Collection Time: 06/07/14 11:25 AM  Result Value Ref Range   Creatinine, Urine 86.61 mg/dL  Sodium, urine, random     Status: None   Collection Time: 06/07/14 11:25 AM  Result Value Ref Range  Sodium, Ur 21 mmol/L  Glucose, capillary     Status: Abnormal   Collection Time: 06/07/14  11:51 AM  Result Value Ref Range   Glucose-Capillary 194 (H) 70 - 99 mg/dL  Cortisol     Status: None   Collection Time: 06/07/14 12:14 PM  Result Value Ref Range   Cortisol, Plasma 31.6 ug/dL  TSH     Status: None   Collection Time: 06/07/14 12:14 PM  Result Value Ref Range   TSH 1.183 0.350 - 4.500 uIU/mL  Rheumatoid factor     Status: None   Collection Time: 06/07/14 12:14 PM  Result Value Ref Range   Rhuematoid fact SerPl-aCnc 12.4 0.0 - 13.9 IU/mL  Glucose, capillary     Status: Abnormal   Collection Time: 06/07/14  4:11 PM  Result Value Ref Range   Glucose-Capillary 221 (H) 70 - 99 mg/dL   Comment 1 Capillary Specimen   Basic metabolic panel     Status: Abnormal   Collection Time: 06/07/14  6:20 PM  Result Value Ref Range   Sodium 126 (L) 135 - 145 mmol/L   Potassium 5.6 (H) 3.5 - 5.1 mmol/L   Chloride 93 (L) 96 - 112 mmol/L   CO2 21 19 - 32 mmol/L   Glucose, Bld 336 (H) 70 - 99 mg/dL   BUN 133 (H) 6 - 23 mg/dL   Creatinine, Ser 5.37 (H) 0.50 - 1.35 mg/dL   Calcium 6.8 (L) 8.4 - 10.5 mg/dL   GFR calc non Af Amer 10 (L) >90 mL/min   GFR calc Af Amer 11 (L) >90 mL/min   Anion gap 12 5 - 15  Glucose, capillary     Status: Abnormal   Collection Time: 06/07/14  8:54 PM  Result Value Ref Range   Glucose-Capillary 359 (H) 70 - 99 mg/dL   Comment 1 Capillary Specimen   Renal function panel     Status: Abnormal   Collection Time: 06/08/14  2:05 AM  Result Value Ref Range   Sodium 128 (L) 135 - 145 mmol/L   Potassium 5.1 3.5 - 5.1 mmol/L   Chloride 91 (L) 96 - 112 mmol/L   CO2 22 19 - 32 mmol/L   Glucose, Bld 345 (H) 70 - 99 mg/dL   BUN 140 (H) 6 - 23 mg/dL   Creatinine, Ser 5.27 (H) 0.50 - 1.35 mg/dL   Calcium 6.9 (L) 8.4 - 10.5 mg/dL   Phosphorus 8.0 (H) 2.3 - 4.6 mg/dL   Albumin 1.9 (L) 3.5 - 5.2 g/dL   GFR calc non Af Amer 10 (L) >90 mL/min   GFR calc Af Amer 11 (L) >90 mL/min   Anion gap 15 5 - 15  CBC     Status: Abnormal   Collection Time: 06/08/14  2:05 AM    Result Value Ref Range   WBC 4.4 4.0 - 10.5 K/uL   RBC 3.36 (L) 4.22 - 5.81 MIL/uL   Hemoglobin 9.6 (L) 13.0 - 17.0 g/dL   HCT 27.7 (L) 39.0 - 52.0 %   MCV 82.4 78.0 - 100.0 fL   MCH 28.6 26.0 - 34.0 pg   MCHC 34.7 30.0 - 36.0 g/dL   RDW 12.4 11.5 - 15.5 %   Platelets 259 150 - 400 K/uL   Dg Chest Port 1 View  06/06/2014   CLINICAL DATA:  74 year old male with congestive heart failure  EXAM: PORTABLE CHEST - 1 VIEW  COMPARISON:  Prior chest x-ray 06/03/2014  FINDINGS: Stable cardiomegaly with  left heart prominence. Decreased interstitial pulmonary edema but persistent pulmonary vascular congestion. Left retrocardiac opacity remains unchanged. No large pleural effusion and no evidence of pneumothorax. No acute osseous abnormality.  IMPRESSION: 1. Resolving pulmonary interstitial edema with persistent vascular congestion. 2. Unchanged left retrocardiac opacity favored to reflect a combination of atelectasis and perhaps pleural effusion.   Electronically Signed   By: Jacqulynn Cadet M.D.   On: 06/06/2014 12:06    Assessment/Plan: Diagnosis: debility after CHF/respiratory failure. ?polyarticular gout 1. Does the need for close, 24 hr/day medical supervision in concert with the patient's rehab needs make it unreasonable for this patient to be served in a less intensive setting? Yes 2. Co-Morbidities requiring supervision/potential complications: dm2, htn,  3. Due to bladder management, bowel management, safety, skin/wound care, disease management, medication administration, pain management and patient education, does the patient require 24 hr/day rehab nursing? Yes 4. Does the patient require coordinated care of a physician, rehab nurse, PT (1-2 hrs/day, 5 days/week) and OT (1-2 hrs/day, 5 days/week) to address physical and functional deficits in the context of the above medical diagnosis(es)? Yes Addressing deficits in the following areas: balance, endurance, locomotion, strength, transferring,  bowel/bladder control, bathing, dressing, feeding, grooming, toileting and psychosocial support 5. Can the patient actively participate in an intensive therapy program of at least 3 hrs of therapy per day at least 5 days per week? Yes 6. The potential for patient to make measurable gains while on inpatient rehab is excellent 7. Anticipated functional outcomes upon discharge from inpatient rehab are supervision and min assist  with PT, supervision and min assist with OT, n/a with SLP. 8. Estimated rehab length of stay to reach the above functional goals is: 15-20 days 9. Does the patient have adequate social supports and living environment to accommodate these discharge functional goals? Yes 10. Anticipated D/C setting: Home 11. Anticipated post D/C treatments: [place] therapy 12. Overall Rehab/Functional Prognosis: excellent  RECOMMENDATIONS: This patient's condition is appropriate for continued rehabilitative care in the following setting: CIR Patient has agreed to participate in recommended program. Yes Note that insurance prior authorization may be required for reimbursement for recommended care.  Comment: Rehab Admissions Coordinator to follow up.  Thanks,  Meredith Staggers, MD, Mellody Drown     06/08/2014

## 2014-06-08 NOTE — Progress Notes (Signed)
Inpatient Diabetes Program Recommendations  AACE/ADA: New Consensus Statement on Inpatient Glycemic Control (2013)  Target Ranges:  Prepandial:   less than 140 mg/dL      Peak postprandial:   less than 180 mg/dL (1-2 hours)      Critically ill patients:  140 - 180 mg/dL   Results for Darrell Richardson, Darrell Richardson (MRN [id number]) as of 06/08/2014 11:23  Ref. Range 06/07/2014 16:11 06/07/2014 20:54 06/08/2014 07:55  Glucose-Capillary Latest Range: 70-99 mg/dL 221 (H) 359 (H) 224 (H)   Elevated CBGs with steroids on board. Pt eating 100%.  Recommendations: Change Novolog moderate to tidwc and hs Add Novolog 3 units tidwc for meal coverage insulin while on steroids.  Will follow. Thank you. Lorenda Peck, RD, LDN, CDE Inpatient Diabetes Coordinator [phone]

## 2014-06-08 NOTE — Progress Notes (Signed)
I met with pt and his wife at bedside. We discussed an inpt rehab admission when pt medically ready. They are in agreement. I will follow. [phone]

## 2014-06-08 NOTE — Progress Notes (Signed)
Patient ID: Darrell Richardson, male   DOB: 1940-05-12, 74 y.o.   MRN: [id number] S:feels weak, can't move my legs like I want. O:BP 124/56 mmHg  Pulse 72  Temp(Src) 97.5 F (36.4 C) (Oral)  Resp 15  Ht 5\' 8"  (1.727 m)  Wt 109.7 kg (241 lb 13.5 oz)  BMI 36.78 kg/m2  SpO2 96%  Intake/Output Summary (Last 24 hours) at 06/08/14 0944 Last data filed at 06/08/14 0500  Gross per 24 hour  Intake    870 ml  Output   2575 ml  Net  -1705 ml   Intake/Output: I/O last 3 completed shifts: In: 7591 [P.O.:1470] Out: 6384 [Urine:3595]  Intake/Output this shift:    Weight change: -1.3 kg (-2 lb 13.9 oz) Gen:WD WN WM in NAD CVS:no rub Resp:occ rhonchi YKZ:LDJTTS VXB:LTJQZES pedal edema    Recent Labs Lab 06/03/14 0314 06/03/14 2100 06/04/14 0327 06/05/14 0300 06/06/14 0230 06/07/14 0653 06/07/14 1820 06/08/14 0205  NA 132* 131* 134* 132* 130* 127* 126* 128*  K 4.0 4.0 4.3 4.7 5.0 5.5* 5.6* 5.1  CL 98 98 97 96 94* 92* 93* 91*  CO2 22 23 22 23 19 23 21 22   GLUCOSE 192* 161* 191* 184* 204* 191* 336* 345*  BUN 51* 59* 65* 84* 104* 126* 133* 140*  CREATININE 3.92* 4.71* 5.01* 5.56* 5.71* 5.59* 5.37* 5.27*  ALBUMIN 2.1* 1.9* 1.9* 1.8* 1.9* 1.9*  --  1.9*  CALCIUM 7.3* 7.3* 7.5* 7.3* 7.3* 7.0* 6.8* 6.9*  PHOS  --  3.9  --   --   --   --   --  8.0*  AST 21 27 28 31  36 33  --   --   ALT 13 17 21 27  37 36  --   --    Liver Function Tests:  Recent Labs Lab 06/05/14 0300 06/06/14 0230 06/07/14 0653 06/08/14 0205  AST 31 36 33  --   ALT 27 37 36  --   ALKPHOS 81 83 79  --   BILITOT 0.4 0.4 0.7  --   PROT 5.6* 5.7* 5.9*  --   ALBUMIN 1.8* 1.9* 1.9* 1.9*   No results for input(s): LIPASE, AMYLASE in the last 168 hours. No results for input(s): AMMONIA in the last 168 hours. CBC:  Recent Labs Lab 06/04/14 0327 06/05/14 0300 06/06/14 0230 06/07/14 0653 06/08/14 0205  WBC 9.3 8.0 6.7 5.9 4.4  NEUTROABS 7.5 6.9 5.9 5.0  --   HGB 10.2* 10.0* 10.1* 10.9* 9.6*  HCT 30.7* 29.1*  29.3* 31.1* 27.7*  MCV 86.7 85.1 84.2 83.8 82.4  PLT 191 204 239 PLATELET CLUMPS NOTED ON SMEAR, COUNT APPEARS ADEQUATE 259   Cardiac Enzymes:  Recent Labs Lab 06/03/14 2100  CKTOTAL 205   CBG:  Recent Labs Lab 06/07/14 0736 06/07/14 1151 06/07/14 1611 06/07/14 2054 06/08/14 0755  GLUCAP 178* 194* 221* 359* 224*    Iron Studies: No results for input(s): IRON, TIBC, TRANSFERRIN, FERRITIN in the last 72 hours. Studies/Results: Dg Chest Port 1 View  06/06/2014   CLINICAL DATA:  74 year old male with congestive heart failure  EXAM: PORTABLE CHEST - 1 VIEW  COMPARISON:  Prior chest x-ray 06/03/2014  FINDINGS: Stable cardiomegaly with left heart prominence. Decreased interstitial pulmonary edema but persistent pulmonary vascular congestion. Left retrocardiac opacity remains unchanged. No large pleural effusion and no evidence of pneumothorax. No acute osseous abnormality.  IMPRESSION: 1. Resolving pulmonary interstitial edema with persistent vascular congestion. 2. Unchanged left retrocardiac opacity favored to  reflect Richardson combination of atelectasis and perhaps pleural effusion.   Electronically Signed   By: Darrell Richardson M.D.   On: 06/06/2014 12:06   . antiseptic oral rinse  7 mL Mouth Rinse BID  . clopidogrel  75 mg Oral Daily  . doxycycline  100 mg Oral Q12H  . heparin  5,000 Units Subcutaneous 3 times per day  . hydrALAZINE  5 mg Oral BID  . insulin aspart  0-15 Units Subcutaneous 6 times per day  . insulin aspart  0-9 Units Subcutaneous TID WC  . insulin glargine  10 Units Subcutaneous Daily  . isosorbide mononitrate  15 mg Oral Daily  . methylPREDNISolone (SOLU-MEDROL) injection  125 mg Intravenous Q24H  . pregabalin  75 mg Oral Daily  . psyllium  1 packet Oral QHS  . sertraline  50 mg Oral QHS  . sodium chloride  3 mL Intravenous Q12H    BMET    Component Value Date/Time   NA 128* 06/08/2014 0205   K 5.1 06/08/2014 0205   CL 91* 06/08/2014 0205   CO2 22 06/08/2014  0205   GLUCOSE 345* 06/08/2014 0205   BUN 140* 06/08/2014 0205   CREATININE 5.27* 06/08/2014 0205   CALCIUM 6.9* 06/08/2014 0205   GFRNONAA 10* 06/08/2014 0205   GFRAA 11* 06/08/2014 0205   CBC    Component Value Date/Time   WBC 4.4 06/08/2014 0205   RBC 3.36* 06/08/2014 0205   HGB 9.6* 06/08/2014 0205   HCT 27.7* 06/08/2014 0205   PLT 259 06/08/2014 0205   MCV 82.4 06/08/2014 0205   MCH 28.6 06/08/2014 0205   MCHC 34.7 06/08/2014 0205   RDW 12.4 06/08/2014 0205   LYMPHSABS 0.3* 06/07/2014 0653   MONOABS 0.6 06/07/2014 0653   EOSABS 0.0 06/07/2014 0653   BASOSABS 0.0 06/07/2014 0653   Assessment/Plan:  1. AKI/CKD- in setting of decompensated CHF and hypotension. Non-oliguric. BUN rising out of proportion to Scr,presumably due to prednisone therapy and diuresis.  Will need to follow closely for any signs of uremia.  Discussed the chronicity and severity of his CKD with he and his wife.  He would be willing to proceed with HD if indicated 2. Hyperkalemia- responded very well to IV lasix 80mg  x 1 with UF of 1.7 liters.  Would switch to po and follow. 3. Hyponatremia- as above, pt with edema, and improved after IV Lasix x 1 and recheck later tomorrow. 1. recheck cortisol, tsh, UNa, Ucr, and follow. 4. Acute on chronic systolic CHF- improved edema and weight 5. Lower extremity weakness- PT/OT. Workup per Cards and Primary svc 6. Severe protein malnutrition 7. 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 Arthralgias/myalgias- possible RA, also possible PMR, no significant improvement with steroids. negative TEE to r/oSBE. Also need to consider drug-induced lupus with hydralazine. Will add anti-histone ab.  Elevated cortisol despite po prednisone, consider Cushing's syndrome work up.  Agree with ID eval. 9.    Darrell Richardson

## 2014-06-09 ENCOUNTER — Inpatient Hospital Stay (HOSPITAL_COMMUNITY): Payer: Medicare Other

## 2014-06-09 DIAGNOSIS — Z515 Encounter for palliative care: Secondary | ICD-10-CM

## 2014-06-09 DIAGNOSIS — R29898 Other symptoms and signs involving the musculoskeletal system: Secondary | ICD-10-CM | POA: Diagnosis present

## 2014-06-09 LAB — GLUCOSE, CAPILLARY
GLUCOSE-CAPILLARY: 148 mg/dL — AB (ref 70–99)
GLUCOSE-CAPILLARY: 209 mg/dL — AB (ref 70–99)
Glucose-Capillary: 189 mg/dL — ABNORMAL HIGH (ref 70–99)
Glucose-Capillary: 195 mg/dL — ABNORMAL HIGH (ref 70–99)
Glucose-Capillary: 208 mg/dL — ABNORMAL HIGH (ref 70–99)
Glucose-Capillary: 271 mg/dL — ABNORMAL HIGH (ref 70–99)

## 2014-06-09 LAB — BASIC METABOLIC PANEL
Anion gap: 15 (ref 5–15)
BUN: 148 mg/dL — ABNORMAL HIGH (ref 6–23)
CHLORIDE: 96 mmol/L (ref 96–112)
CO2: 21 mmol/L (ref 19–32)
Calcium: 7.1 mg/dL — ABNORMAL LOW (ref 8.4–10.5)
Creatinine, Ser: 4.36 mg/dL — ABNORMAL HIGH (ref 0.50–1.35)
GFR calc Af Amer: 14 mL/min — ABNORMAL LOW (ref >90)
GFR, EST NON AFRICAN AMERICAN: 12 mL/min — AB (ref >90)
Glucose, Bld: 212 mg/dL — ABNORMAL HIGH (ref 70–99)
Potassium: 4.6 mmol/L (ref 3.5–5.1)
SODIUM: 132 mmol/L — AB (ref 135–145)

## 2014-06-09 LAB — IRON AND TIBC
Iron: <10 ug/dL — ABNORMAL LOW (ref 42–165)
UIBC: 114 ug/dL — AB (ref 125–400)

## 2014-06-09 LAB — CBC
HEMATOCRIT: 30.7 % — AB (ref 39.0–52.0)
Hemoglobin: 10.8 g/dL — ABNORMAL LOW (ref 13.0–17.0)
MCH: 29.3 pg (ref 26.0–34.0)
MCHC: 35.2 g/dL (ref 30.0–36.0)
MCV: 83.4 fL (ref 78.0–100.0)
Platelets: 313 10*3/uL (ref 150–400)
RBC: 3.68 MIL/uL — ABNORMAL LOW (ref 4.22–5.81)
RDW: 12.5 % (ref 11.5–15.5)
WBC: 7.6 10*3/uL (ref 4.0–10.5)

## 2014-06-09 LAB — ROCKY MTN SPOTTED FVR ABS PNL(IGG+IGM)
RMSF IGG: NEGATIVE
RMSF IgM: 0.5 index (ref 0.00–0.89)

## 2014-06-09 LAB — HEPATITIS B SURFACE ANTIGEN: Hepatitis B Surface Ag: NEGATIVE

## 2014-06-09 MED ORDER — INSULIN ASPART 100 UNIT/ML ~~LOC~~ SOLN
0.0000 [IU] | Freq: Three times a day (TID) | SUBCUTANEOUS | Status: DC
  Administered 2014-06-09: 4 [IU] via SUBCUTANEOUS
  Administered 2014-06-09: 7 [IU] via SUBCUTANEOUS
  Administered 2014-06-10: 11 [IU] via SUBCUTANEOUS
  Administered 2014-06-10: 4 [IU] via SUBCUTANEOUS
  Administered 2014-06-10: 3 [IU] via SUBCUTANEOUS
  Administered 2014-06-11: 7 [IU] via SUBCUTANEOUS
  Administered 2014-06-11: 3 [IU] via SUBCUTANEOUS
  Administered 2014-06-11: 4 [IU] via SUBCUTANEOUS

## 2014-06-09 MED ORDER — METHYLPREDNISOLONE SODIUM SUCC 125 MG IJ SOLR
60.0000 mg | INTRAMUSCULAR | Status: DC
  Administered 2014-06-09 – 2014-06-10 (×2): 60 mg via INTRAVENOUS
  Filled 2014-06-09 (×2): qty 0.96

## 2014-06-09 MED ORDER — INSULIN GLARGINE 100 UNIT/ML ~~LOC~~ SOLN
18.0000 [IU] | Freq: Every day | SUBCUTANEOUS | Status: DC
  Administered 2014-06-09 – 2014-06-11 (×3): 18 [IU] via SUBCUTANEOUS
  Filled 2014-06-09 (×3): qty 0.18

## 2014-06-09 MED ORDER — INSULIN GLARGINE 100 UNIT/ML ~~LOC~~ SOLN: 20.0000 [IU] | Freq: Every day | SUBCUTANEOUS | Status: DC

## 2014-06-09 NOTE — Progress Notes (Signed)
[facility] TEAM 1 - Stepdown/ICU TEAM Progress Note  Darrell Richardson BTD:[id number] DOB: 06/02/1940 DOA: 05/31/2014 PCP: [name], MD  Admit HPI / Brief Narrative: 74 y.o. M Hx diabetes type II uncontrolled, CHF, HTN, CKD, and CVA w/ residual Lt Hemiparesis who presented to [facility] complaining of shortness of breath. He was found to be in acute respiratory failure secondary to an acute CHF exacerbation, placed on BiPAP, and sent to [facility] for further evaluation.  In the ED troponin was negative, creatinine 2.44, hemoglobin 13.3. BNP was 7363. Patient was given 1 dose of Lasix and sent for further evaluation.  HPI/Subjective: Today the pt denies pain.  No joint pain, no chest pain, no abdom pain.  He states he feels that both legs are "very very weak."  He feels he has trouble even standing.  He can move his legs, and has not lost feeling in his legs.  He denies sob, n/v, or HA.    Assessment/Plan:  Initial Fever / Arthralgias / myalgias -differential is very wide, and includes PMR - RA not likely w/ normal RF - hydralazine induced lupus not likely w/ negative histone ab - RMSF v/s Lyme v/s ehrlichiosis seems reasonable, and I agree w/ empiric tx while studies pending - ANCA studies normal - in that pt is afebrile, and improving, I do not see that ID would add to his care at this time - if his Lyme, erlichia, or RMSF titers are + I will ask them to advise on length of tx, etc -resp viral panel negative for influenza or URI -ESR 135 - repeat in AM -it is also possible that his overall feeling of unwellness is due to his BUN of 143, and that his arthralgias are due to his known gout  -he has not had a true fever since 2/24 -his focal LE weakness is new to me, compared to c/o generalized weakness during prior visits - he has no back pain, and no urologic or bowel control sx, but I feel MRI of the lumbar and sacral spine is warrented (w/o contrast due to renal  failure)  Acute on chronic respiratory failure with hypoxia  -BIPAP now weaned to [place] O2, which has since been weaned to RA   Acute renal failure on CKD stage III  Baseline crt 2.5-2.8 - overall renal fxn has worsened significantly during this hospital stay, but his crt is presently slowly improving - Nephrology following  Hyperkalemia  -improved w/ diuresis - cont to follow   Hyponatremia -improved w/ diuresis - cont to follow   Cardiomyopathy / 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 CHF -care as per CHF Team - appears well compensated at present   Hypertension -BP currently stable, but climbing - follow w/o change today, but may soon require titration of meds   Diabetes mellitus type 2 uncontrolled -A1c 7.5 - CBG climbing w/ use of steroids - adjust tx again and follow   Urinary retention  -empiric tx for presumed BPH - begin voiding trials after renal function improves further    History 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 - per wife did not attend inpatient rehabilitation  -plan is for CIR placement once acute issues resolved   Gout -Uric acid 8.3 - goal <6  -Continue steroid, but begin to taper from high current dose  -Secondary to patient's worsening renal function DC'd allopurinol  Code Status: FULL Family Communication:  No family present at time of exam  Disposition Plan: transfer to  tele bed - cont PT/OT - cont eval for LE weakness/systemic syndrome - follow renal fxn - for eventual transfer to CIR   Consultants: Heart Failure Team  Nephrology   Procedure/Significant Events: 2/22 TTE - Left ventricle: mild LVH. -LVEF= 40% to 45%. akinesis of inferolateral and inferior myocardium -grade 2 diastolic dysfunction - Left atrium: severely dilated - Pericardium, extracardiac: A small pericardial effusion was identified 2/29 TEE - No TEE evidence of endocarditis - EF = 40-45%  Antibiotics: Azithromycin 2/23 > 2/24 Vancomycin 2/23 > 2/24 Doxy 3/1 >  DVT  prophylaxis: Subcutaneous heparin  Objective: Blood pressure 151/71, pulse 72, temperature 97.5 F (36.4 C), temperature source Oral, resp. rate 15, height _0  (1.727 m), weight 109.5 kg (241 lb 6.5 oz), SpO2 97 %.  Intake/Output Summary (Last 24 hours) at 06/09/14 0852 Last data filed at 06/09/14 0559  Gross per 24 hour  Intake    200 ml  Output   2780 ml  Net  -2580 ml   Exam: General: alert and conversant - no acute resp distress  Lungs: Clear to auscultation bilaterally without wheezes or crackles Cardiovascular: Regular rate and rhythm without murmur gallop or rub  Abdomen: Nontender, nondistended, soft, bowel sounds positive, no rebound, no ascites, no appreciable mass Extremities: No significant cyanosis, clubbing, or edema bilateral lower extremities - no joint erythema or effusions noted B LE  Neuro:  4/5 strength B LE - intact sensation touch th/o   Data Reviewed: Basic Metabolic Panel:  Recent Labs Lab 06/03/14 2100 06/04/14 0327 06/05/14 0300 06/06/14 0230 06/07/14 0653 06/07/14 1820 06/08/14 0205 06/09/14 0309  NA 131* 134* 132* 130* 127* 126* 128* 132*  K 4.0 4.3 4.7 5.0 5.5* 5.6* 5.1 4.6  CL 98 97 96 94* 92* 93* 91* 96  CO2 _1 GLUCOSE 161* 191* 184* 204* 191* 336* 345* 212*  BUN 59* 65* 84* 104* 126* 133* 140* 148*  CREATININE 4.71* 5.01* 5.56* 5.71* 5.59* 5.37* 5.27* 4.36*  CALCIUM 7.3* 7.5* 7.3* 7.3* 7.0* 6.8* 6.9* 7.1*  MG  --  2.1 2.3 2.4 2.7*  --   --   --   PHOS 3.9  --   --   --   --   --  8.0*  --    Liver Function Tests:  Recent Labs Lab 06/03/14 2100 06/04/14 0327 06/05/14 0300 06/06/14 0230 06/07/14 0653 06/08/14 0205  AST _2 36 33  --   ALT _3 37 36  --   ALKPHOS 75 79 81 83 79  --   BILITOT 0.6 0.8 0.4 0.4 0.7  --   PROT 5.5* 5.8* 5.6* 5.7* 5.9*  --   ALBUMIN 1.9* 1.9* 1.8* 1.9* 1.9* 1.9*    CBC:  Recent Labs Lab 06/03/14 2100 06/04/14 0327 06/05/14 0300 06/06/14 0230 06/07/14 0653  06/08/14 0205 06/09/14 0309  WBC 8.1 9.3 8.0 6.7 5.9 4.4 7.6  NEUTROABS 6.3 7.5 6.9 5.9 5.0  --   --   HGB 10.1* 10.2* 10.0* 10.1* 10.9* 9.6* 10.8*  HCT 29.9* 30.7* 29.1* 29.3* 31.1* 27.7* 30.7*  MCV 86.2 86.7 85.1 84.2 83.8 82.4 83.4  PLT 174 191 204 239 PLATELET CLUMPS NOTED ON SMEAR, COUNT APPEARS ADEQUATE 259 313   CBG:  Recent Labs Lab 06/08/14 1631 06/08/14 2025 06/09/14 0027 06/09/14 0523 06/09/14 0816  GLUCAP 422* 373* 271* 189* 148*    Recent Results (from the past 240 hour(s))  MRSA PCR  Screening     Status: None   Collection Time: 05/31/14 10:55 AM  Result Value Ref Range Status   MRSA by PCR NEGATIVE NEGATIVE Final    Comment:        The GeneXpert MRSA Assay (FDA approved for NASAL specimens only), is one component of a comprehensive MRSA colonization surveillance program. It is not intended to diagnose MRSA infection nor to guide or monitor treatment for MRSA infections.   Respiratory virus panel (routine influenza)     Status: None   Collection Time: 06/01/14  9:01 PM  Result Value Ref Range Status   Source - RVPAN NASAL SWAB  Corrected   Respiratory Syncytial Virus A Negative Negative Final   Respiratory Syncytial Virus B Negative Negative Final   Influenza A Negative Negative Final   Influenza B Negative Negative Final   Parainfluenza 1 Negative Negative Final   Parainfluenza 2 Negative Negative Final   Parainfluenza 3 Negative Negative Final   Metapneumovirus Negative Negative Final   Rhinovirus Negative Negative Final   Adenovirus Negative Negative Final    Comment: (NOTE) Performed At: [facility] [address] [place], [place] [id number] Lindon Romp MD LP:[id number]   Culture, blood (routine x 2)     Status: None   Collection Time: 06/01/14 10:33 PM  Result Value Ref Range Status   Specimen Description BLOOD RIGHT HAND  Final   Special Requests BOTTLES DRAWN AEROBIC ONLY 5CC  Final   Culture   Final    NO GROWTH 5  DAYS Performed at [facility]    Report Status 06/08/2014 FINAL  Final  Culture, blood (routine x 2)     Status: None   Collection Time: 06/01/14 10:45 PM  Result Value Ref Range Status   Specimen Description BLOOD LEFT HAND  Final   Special Requests BOTTLES DRAWN AEROBIC ONLY 4CC  Final   Culture   Final    NO GROWTH 5 DAYS Performed at [facility]    Report Status 06/08/2014 FINAL  Final    Studies:  Recent x-ray studies have been reviewed in detail by the Attending Physician  Scheduled Meds:  Scheduled Meds: . antiseptic oral rinse  7 mL Mouth Rinse BID  . clopidogrel  75 mg Oral Daily  . doxycycline  100 mg Oral Q12H  . heparin  5,000 Units Subcutaneous 3 times per day  . hydrALAZINE  5 mg Oral BID  . insulin aspart  0-20 Units Subcutaneous 6 times per day  . insulin glargine  15 Units Subcutaneous Daily  . isosorbide mononitrate  15 mg Oral Daily  . methylPREDNISolone (SOLU-MEDROL) injection  125 mg Intravenous Q24H  . pregabalin  75 mg Oral Daily  . psyllium  1 packet Oral QHS  . sertraline  50 mg Oral QHS  . sodium chloride  3 mL Intravenous Q12H    Time spent on care of this patient: 35 mins  Cherene Altes, MD [facility] For Consults/Admissions - Flow Manager - [phone] Office  [phone]  Contact MD directly via text page:      [email or web address]      password [facility]  06/09/2014, 8:52 AM   LOS: 9 days

## 2014-06-09 NOTE — Progress Notes (Signed)
Physical Therapy Treatment Patient Details Name: Darrell Richardson MRN: [id number] DOB: 1940/12/26 Today's Date: 06/09/2014    History of Present Illness 74 y.o. M Hx diabetes type II uncontrolled, CHF, HTN, CKD, and CVA w/ residual Lt Hemiparesis who presented to [facility] complaining of shortness of breath. He was found to be in acute respiratory failure secondary to an acute CHF exacerbation, placed on BiPAP and sent to [facility] for further evaluation.    PT Comments    Pt making slow but steady progress. Pt remains very motivated to participate and improve.  Follow Up Recommendations  CIR     Equipment Recommendations  Other (comment) (to be determined)    Recommendations for Other Services       Precautions / Restrictions Precautions Precautions: Fall    Mobility  Bed Mobility Overal bed mobility: Needs Assistance Bed Mobility: Supine to Sit     Supine to sit: Mod assist;HOB elevated     General bed mobility comments: Pt able to move legs off EOB. Assist to elevate trunk and bring hips to EOB.  Transfers Overall transfer level: Needs assistance Equipment used: Ambulation equipment used (Stedy and [employer]) Transfers: Sit to/from [employer] to Stand: +2 physical assistance;Total assist;From elevated surface Stand pivot transfers: +2 physical assistance;Total assist;From elevated surface       General transfer comment: Attempted x 5 to stand with Bariatric Stedy and pt able to clear hips from bed 8-10 inches but unable to achieve full stand. Used Clarise Cruz Plus to transfer from bed to chair and with extra support at hips while pivoting due to hips/knees flexing.  Ambulation/Gait                 Stairs            Wheelchair Mobility    Modified Rankin (Stroke Patients Only)       Balance Overall balance assessment: Needs assistance Sitting-balance support: Feet supported;Bilateral upper extremity  supported;No upper extremity supported;Feet unsupported Sitting balance-Leahy Scale: Poor Sitting balance - Comments: Pt sat EOB x 20 minutes. Initially able to sit statically with supervision. Increased assist needed with functional activities and fatigue. Postural control: Posterior lean Standing balance support: Bilateral upper extremity supported Standing balance-Leahy Scale: Zero                      Cognition Arousal/Alertness: Awake/alert Behavior During Therapy: WFL for tasks assessed/performed Overall Cognitive Status: Within Functional Limits for tasks assessed                      Exercises      General Comments        Pertinent Vitals/Pain Pain Assessment: Faces Faces Pain Scale: Hurts even more Pain Location: bilateral ankles with weight bearing Pain Descriptors / Indicators: Grimacing Pain Intervention(s): Limited activity within patient's tolerance;Monitored during session;Repositioned    Home Living                      Prior Function            PT Goals (current goals can now be found in the care plan section) Progress towards PT goals: Progressing toward goals    Frequency  Min 3X/week    PT Plan Current plan remains appropriate    Co-evaluation PT/OT/SLP Co-Evaluation/Treatment: Yes Reason for Co-Treatment: For patient/therapist safety PT goals addressed during session: Mobility/safety with mobility;Balance;Proper use of DME  End of Session Equipment Utilized During Treatment: Gait belt;Other (comment) Charlaine Dalton, Sara Plus) Activity Tolerance: Patient tolerated treatment well Patient left: in chair;with call bell/phone within reach     Time: 0832-0914 PT Time Calculation (min) (ACUTE ONLY): 42 min  Charges:  $Gait Training: 23-37 mins                    G Codes:      Darrell Richardson 2014-06-22, 10:21 AM  Suanne Marker PT [phone]

## 2014-06-09 NOTE — Evaluation (Signed)
Occupational Therapy Evaluation Patient Details Name: Darrell Richardson MRN: [id number] DOB: 08/18/1940 Today's Date: 06/09/2014    History of Present Illness 74 y.o. M Hx diabetes type II uncontrolled, CHF, HTN, CKD, and CVA w/ residual Lt Hemiparesis who presented to [facility] complaining of shortness of breath. He was found to be in acute respiratory failure secondary to an acute CHF exacerbation, placed on BiPAP and sent to [facility] for further evaluation.   Clinical Impression   Pt was independent in ADL prior to admission. He presents with generalized weakness, pain in B ankles from gout, poor balance and decreased activity tolerance interfering with ability to perform self care and transfers.  Pt requiring use of lift equipment for OOB.  Pt is highly motivated to return to independence.  Pt is a good inpatient rehab candidate.  Will follow acutely.    Follow Up Recommendations  CIR    Equipment Recommendations       Recommendations for Other Services       Precautions / Restrictions Precautions Precautions: Fall Restrictions Weight Bearing Restrictions: No      Mobility Bed Mobility Overal bed mobility: Needs Assistance Bed Mobility: Supine to Sit     Supine to sit: Mod assist;HOB elevated     General bed mobility comments: Pt able to move legs off EOB. Assist to elevate trunk and bring hips to EOB.  Transfers Overall transfer level: Needs assistance Equipment used: Ambulation equipment used (Stedy and [employer]) Transfers: Sit to/from [employer] to Stand: +2 physical assistance;Total assist;From elevated surface Stand pivot transfers: +2 physical assistance;Total assist;From elevated surface       General transfer comment: Attempted x 5 to stand with Bariatric Stedy and pt able to clear hips from bed 8-10 inches but unable to achieve full stand. Used Clarise Cruz Plus to transfer from bed to chair and with extra support at  hips while pivoting due to hips/knees flexing.    Balance Overall balance assessment: Needs assistance Sitting-balance support: Feet supported;Bilateral upper extremity supported;No upper extremity supported;Feet unsupported Sitting balance-Leahy Scale: Poor Sitting balance - Comments: Pt sat EOB x 20 minutes. Initially able to sit statically with supervision. Increased assist needed with functional activities and fatigue. Postural control: Posterior lean Standing balance support: Bilateral upper extremity supported Standing balance-Leahy Scale: Zero                              ADL Overall ADL's : Needs assistance/impaired Eating/Feeding: Independent;Sitting   Grooming: Set up;Sitting   Upper Body Bathing: Sitting;Moderate assistance Upper Body Bathing Details (indicate cue type and reason): pt with difficulty maintaining sitting balance during UB ADL Lower Body Bathing: Total assistance;Bed level   Upper Body Dressing : Moderate assistance;Sitting Upper Body Dressing Details (indicate cue type and reason): pt with difficulty maintaining sitting balance with UB dressing Lower Body Dressing: Total assistance;Bed level                       Vision     Perception     Praxis      Pertinent Vitals/Pain Pain Assessment: Faces Faces Pain Scale: Hurts even more Pain Location: B ankles Pain Descriptors / Indicators: Aching Pain Intervention(s): Limited activity within patient's tolerance     Hand Dominance Right   Extremity/Trunk Assessment Upper Extremity Assessment Upper Extremity Assessment: Overall WFL for tasks assessed   Lower Extremity Assessment Lower Extremity Assessment: Defer  to PT evaluation       Communication Communication Communication: No difficulties   Cognition Arousal/Alertness: Awake/alert Behavior During Therapy: WFL for tasks assessed/performed Overall Cognitive Status: Within Functional Limits for tasks assessed                      General Comments       Exercises       Shoulder Instructions      Home Living Family/patient expects to be discharged to:: Private residence Living Arrangements: Spouse/significant other Available Help at Discharge: Family Type of Home: House Home Access: [place]: One level     Bathroom Shower/Tub: Hospital doctor Toilet: Handicapped height     Home Equipment: Environmental consultant - 2 wheels;Walker - 4 wheels;Cane - quad;Cane - single point;Shower seat;Grab bars - tub/shower          Prior Functioning/Environment Level of Independence: Independent        Comments: occassional use of cane/walker if bad day    OT Diagnosis: Generalized weakness   OT Problem List: Decreased strength;Decreased activity tolerance;Impaired balance (sitting and/or standing);Decreased knowledge of use of DME or AE;Obesity;Pain;Cardiopulmonary status limiting activity   OT Treatment/Interventions: Self-care/ADL training;DME and/or AE instruction;Therapeutic activities;Patient/family education;Balance training    OT Goals(Current goals can be found in the care plan section) Acute Rehab OT Goals Patient Stated Goal: to get home OT Goal Formulation: With patient Time For Goal Achievement: 06/23/14 Potential to Achieve Goals: Good ADL Goals Pt Will Perform Grooming: with min assist;standing Pt Will Perform Upper Body Bathing: with set-up;sitting Pt Will Perform Lower Body Bathing: with min assist;with adaptive equipment;sit to/from stand Pt Will Perform Upper Body Dressing: with set-up;sitting Pt Will Perform Lower Body Dressing: with min assist;sit to/from stand;with adaptive equipment Pt Will Transfer to Toilet: with min assist;ambulating Pt Will Perform Toileting - Clothing Manipulation and hygiene: with min assist;sit to/from stand  OT Frequency: Min 2X/week   Barriers to D/C:            Co-evaluation PT/OT/SLP Co-Evaluation/Treatment:  Yes Reason for Co-Treatment: Complexity of the patient's impairments (multi-system involvement);For patient/therapist safety PT goals addressed during session: Mobility/safety with mobility;Balance;Proper use of DME OT goals addressed during session: ADL's and self-care      End of Session Nurse Communication: Need for lift equipment  Activity Tolerance:   Patient left: in chair;with call bell/phone within reach   Time: 0830-0915 OT Time Calculation (min): 45 min Charges:  OT General Charges $OT Visit: 1 Procedure OT Evaluation $Initial OT Evaluation Tier I: 1 Procedure G-Codes:    Malka So 06/09/2014, 10:52 AM  [phone]

## 2014-06-09 NOTE — Consult Note (Signed)
Patient [name]      DOB: October 07, 1940      AVW:[id number]     Consult Note from the Palliative Medicine Team at [facility] Requested by: Dr. Sherral Hammers     PCP: Darrell Pop, MD Reason for Consultation: Considering Dialysis   Phone [place] of patients Current state: I met today with Darrell Richardson and wife at bedside. They are a lovely couple and have been married 39 years. She tells me that he has become increasingly weak and anxious to be left alone over the past 2 months - he has told her that he thought he was going to die soon over this time prior to hospitalization. He is dealing with a lot of diffuse pain right now and has had a Vicodin recently that is making him lethargic - he says the pain is better but does not go away completely. He is also having focal BLE weakness and went for MRI lumbar/sacral spine at the end of our conversation.   We were able to discuss dialysis and Darrell Richardson was very clear that at this point he does NOT want dialysis. If he were to greatly improve to a much better QOL then he might consider dialysis at that time if indicated. Darrell Richardson agrees that this would be too difficult for him at this time and especially if he continues to have problems with pain/weakness and she would not want him to have to go through dialysis. She is upset with the thought that she may lose her husband (both her parents have passed in past couple years) but she said she would not want [place] to prolong his suffering if he continues in this condition. With this said she says they have a Living Will and he would not want to be intubated and placed "on machines." I did address CPR and she says they had not discussed this but they will think about this - especially if his kidneys worsen. They are very reasonable people and family is very supportive. I will continue to follow and can follow if he goes to CIR as well.    Goals of Care: 1.  Code Status: NO  INTUBATION   2. Disposition: Hopeful for CIR.    3. Symptom Management:   1. Diffuse pain especially in extremities: Continue Vicodin for now - he is fairly lethargic with 2 tabs so consider 1 tab and see if this is effective for pain relief. May need to consider long acting low dose oxycontin with continued pain and no reversible cause identified.  2. Weakness: Continue medical management. Manage pain. PT/OT following with planned transition to CIR.  3. Appetite is improving and wife says he seems to be eating more of his meals each day.   4. Psychosocial: Emotional support provided to patient and family at bedside.    Brief HPI: 74 yo male admitted with shortness of breath r/t acute on chronic combined heart failure requiring short term BiPAP. He has had gradually worsening weakness especially in BLE. He also has had fever of unknown origin since admission with arthralgias/myalgias that are still being assessed/tested for etiology. He has also had acute on chronic renal failure being followed by nephrology for need for dialysis - now he is saying he would not want dialysis. PMH reviewed.    ROS: + diffuse pain, + BLE weakness. Nausea resolved.     PMH:  Past Medical History  Diagnosis Date  . Hearing loss   .  Gout   . Diabetes   . Fatigue   . Hypertension   . Stroke     X 2  . Heart failure   . Muscle pain   . Hammertoe      PSH: Past Surgical History  Procedure Laterality Date  . Stomach surgery    . Foot surgery Right   . Tee without cardioversion N/A 06/07/2014    Procedure: TRANSESOPHAGEAL ECHOCARDIOGRAM (TEE);  Surgeon: Jolaine Artist, MD;  Location[facility] ENDOSCOPY;  Service: Cardiovascular;  Laterality: N/A;   I have reviewed the Darrell Richardson and SH and  If appropriate update it with new information. Allergies  Allergen Reactions  . Cortisone Anaphylaxis and Other (See Comments)    Cold, elevated BP, shaking all over and fever  Wife says tolerates hydrocortisone  cream at home  . Ambien [Zolpidem Tartrate]     Did not tolerate well or remembered anything from previous night  . Rocephin [Ceftriaxone] Hives  . Levaquin [Levofloxacin In D5w] Rash   Scheduled Meds: . antiseptic oral rinse  7 mL Mouth Rinse BID  . clopidogrel  75 mg Oral Daily  . doxycycline  100 mg Oral Q12H  . heparin  5,000 Units Subcutaneous 3 times per day  . hydrALAZINE  5 mg Oral BID  . insulin aspart  0-20 Units Subcutaneous TID WC  . insulin glargine  18 Units Subcutaneous Daily  . isosorbide mononitrate  15 mg Oral Daily  . methylPREDNISolone (SOLU-MEDROL) injection  60 mg Intravenous Q24H  . pregabalin  75 mg Oral Daily  . psyllium  1 packet Oral QHS  . sertraline  50 mg Oral QHS  . sodium chloride  3 mL Intravenous Q12H   Continuous Infusions:  PRN Meds:.acetaminophen **OR** acetaminophen, diphenhydrAMINE, HYDROcodone-acetaminophen, hydrocortisone cream, ipratropium-albuterol, morphine injection, ondansetron (ZOFRAN) IV, oxymetazoline    BP 150/66 mmHg  Pulse 73  Temp(Src) 97.8 F (36.6 C) (Oral)  Resp 20  Ht _0  (1.727 m)  Wt 107.1 kg (236 lb 1.8 oz)  BMI 35.91 kg/m2  SpO2 100%   PPS: 30%   Intake/Output Summary (Last 24 hours) at 06/09/14 1515 Last data filed at 06/09/14 1347  Gross per 24 hour  Intake    400 ml  Output   2905 ml  Net  -2505 ml   LBM: 3/2  Physical Exam:  General: NAD, lying in bed, pleasant HEENT: [place]/AT, no JVD, moist mucous membranes Chest: No labored breathing, symmetric CVS: RRR Abdomen: Soft, NT, ND Ext: BLE edema especially in feet, warm to touch Neuro: Awake and oriented x 3 but dosing off to sleep likely s/t pain meds  Labs: CBC    Component Value Date/Time   WBC 7.6 06/09/2014 0309   RBC 3.68* 06/09/2014 0309   HGB 10.8* 06/09/2014 0309   HCT 30.7* 06/09/2014 0309   PLT 313 06/09/2014 0309   MCV 83.4 06/09/2014 0309   MCH 29.3 06/09/2014 0309   MCHC 35.2 06/09/2014 0309   RDW 12.5 06/09/2014 0309    LYMPHSABS 0.3* 06/07/2014 0653   MONOABS 0.6 06/07/2014 0653   EOSABS 0.0 06/07/2014 0653   BASOSABS 0.0 06/07/2014 0653    BMET    Component Value Date/Time   NA 132* 06/09/2014 0309   K 4.6 06/09/2014 0309   CL 96 06/09/2014 0309   CO2 21 06/09/2014 0309   GLUCOSE 212* 06/09/2014 0309   BUN 148* 06/09/2014 0309   CREATININE 4.36* 06/09/2014 0309   CALCIUM 7.1* 06/09/2014 0309   GFRNONAA  12* 06/09/2014 0309   GFRAA 14* 06/09/2014 0309    CMP     Component Value Date/Time   NA 132* 06/09/2014 0309   K 4.6 06/09/2014 0309   CL 96 06/09/2014 0309   CO2 21 06/09/2014 0309   GLUCOSE 212* 06/09/2014 0309   BUN 148* 06/09/2014 0309   CREATININE 4.36* 06/09/2014 0309   CALCIUM 7.1* 06/09/2014 0309   PROT 5.9* 06/07/2014 0653   ALBUMIN 1.9* 06/08/2014 0205   AST 33 06/07/2014 0653   ALT 36 06/07/2014 0653   ALKPHOS 79 06/07/2014 0653   BILITOT 0.7 06/07/2014 0653   GFRNONAA 12* 06/09/2014 0309   GFRAA 14* 06/09/2014 0309     Time In Time Out Total Time Spent with Patient Total Overall Time  1400 1520 49mn 873m    Greater than 50%  of this time was spent counseling and coordinating care related to the above assessment and plan.  AlVinie SillNP Palliative Medicine Team Pager # [phone]-F 8a-5p) Team Phone # [phone]/Weekends)

## 2014-06-09 NOTE — Progress Notes (Signed)
Patient ID: Darrell Richardson, male   DOB: 1941-02-26, 74 y.o.   MRN: [id number] S:feels well except for leg weakness O:BP 151/71 mmHg  Pulse 72  Temp(Src) 97.5 F (36.4 C) (Oral)  Resp 15  Ht 5\' 8"  (1.727 m)  Wt 109.5 kg (241 lb 6.5 oz)  BMI 36.71 kg/m2  SpO2 97%  Intake/Output Summary (Last 24 hours) at 06/09/14 0818 Last data filed at 06/09/14 0559  Gross per 24 hour  Intake    200 ml  Output   2780 ml  Net  -2580 ml   Intake/Output: I/O last 3 completed shifts: In: 840 [P.O.:840] Out: 4230 [Urine:4230]  Intake/Output this shift:    Weight change: -0.2 kg (-7.1 oz) Gen:WD WN WM in NAD CVS:no rub Resp:occ rhonchi QQI:WLNLGX Ext:tr pedal edema   Recent Labs Lab 06/03/14 0314 06/03/14 2100 06/04/14 0327 06/05/14 0300 06/06/14 0230 06/07/14 0653 06/07/14 1820 06/08/14 0205 06/09/14 0309  NA 132* 131* 134* 132* 130* 127* 126* 128* 132*  K 4.0 4.0 4.3 4.7 5.0 5.5* 5.6* 5.1 4.6  CL 98 98 97 96 94* 92* 93* 91* 96  CO2 22 23 22 23 19 23 21 22 21   GLUCOSE 192* 161* 191* 184* 204* 191* 336* 345* 212*  BUN 51* 59* 65* 84* 104* 126* 133* 140* 148*  CREATININE 3.92* 4.71* 5.01* 5.56* 5.71* 5.59* 5.37* 5.27* 4.36*  ALBUMIN 2.1* 1.9* 1.9* 1.8* 1.9* 1.9*  --  1.9*  --   CALCIUM 7.3* 7.3* 7.5* 7.3* 7.3* 7.0* 6.8* 6.9* 7.1*  PHOS  --  3.9  --   --   --   --   --  8.0*  --   AST 21 27 28 31  36 33  --   --   --   ALT 13 17 21 27  37 36  --   --   --    Liver Function Tests:  Recent Labs Lab 06/05/14 0300 06/06/14 0230 06/07/14 0653 06/08/14 0205  AST 31 36 33  --   ALT 27 37 36  --   ALKPHOS 81 83 79  --   BILITOT 0.4 0.4 0.7  --   PROT 5.6* 5.7* 5.9*  --   ALBUMIN 1.8* 1.9* 1.9* 1.9*   No results for input(s): LIPASE, AMYLASE in the last 168 hours. No results for input(s): AMMONIA in the last 168 hours. CBC:  Recent Labs Lab 06/05/14 0300 06/06/14 0230 06/07/14 0653 06/08/14 0205 06/09/14 0309  WBC 8.0 6.7 5.9 4.4 7.6  NEUTROABS 6.9 5.9 5.0  --   --   HGB  10.0* 10.1* 10.9* 9.6* 10.8*  HCT 29.1* 29.3* 31.1* 27.7* 30.7*  MCV 85.1 84.2 83.8 82.4 83.4  PLT 204 239 PLATELET CLUMPS NOTED ON SMEAR, COUNT APPEARS ADEQUATE 259 313   Cardiac Enzymes:  Recent Labs Lab 06/03/14 2100  CKTOTAL 205   CBG:  Recent Labs Lab 06/08/14 1109 06/08/14 1631 06/08/14 2025 06/09/14 0027 06/09/14 0523  GLUCAP 282* 422* 373* 271* 189*    Iron Studies: No results for input(s): IRON, TIBC, TRANSFERRIN, FERRITIN in the last 72 hours. Studies/Results: No results found. Marland Kitchen antiseptic oral rinse  7 mL Mouth Rinse BID  . clopidogrel  75 mg Oral Daily  . doxycycline  100 mg Oral Q12H  . heparin  5,000 Units Subcutaneous 3 times per day  . hydrALAZINE  5 mg Oral BID  . insulin aspart  0-20 Units Subcutaneous 6 times per day  . insulin glargine  15 Units  Subcutaneous Daily  . isosorbide mononitrate  15 mg Oral Daily  . methylPREDNISolone (SOLU-MEDROL) injection  125 mg Intravenous Q24H  . pregabalin  75 mg Oral Daily  . psyllium  1 packet Oral QHS  . sertraline  50 mg Oral QHS  . sodium chloride  3 mL Intravenous Q12H    BMET    Component Value Date/Time   NA 132* 06/09/2014 0309   K 4.6 06/09/2014 0309   CL 96 06/09/2014 0309   CO2 21 06/09/2014 0309   GLUCOSE 212* 06/09/2014 0309   BUN 148* 06/09/2014 0309   CREATININE 4.36* 06/09/2014 0309   CALCIUM 7.1* 06/09/2014 0309   GFRNONAA 12* 06/09/2014 0309   GFRAA 14* 06/09/2014 0309   CBC    Component Value Date/Time   WBC 7.6 06/09/2014 0309   RBC 3.68* 06/09/2014 0309   HGB 10.8* 06/09/2014 0309   HCT 30.7* 06/09/2014 0309   PLT 313 06/09/2014 0309   MCV 83.4 06/09/2014 0309   MCH 29.3 06/09/2014 0309   MCHC 35.2 06/09/2014 0309   RDW 12.5 06/09/2014 0309   LYMPHSABS 0.3* 06/07/2014 0653   MONOABS 0.6 06/07/2014 0653   EOSABS 0.0 06/07/2014 0653   BASOSABS 0.0 06/07/2014 0653     Assessment/Plan:  1. AKI/CKD- in setting of decompensated CHF and hypotension. Non-oliguric. BUN  rising out of proportion to Scr,presumably due to prednisone therapy and diuresis. Will need to follow closely for any signs of uremia. Discussed the chronicity and severity of his CKD with he and his wife. He would be willing to proceed with HD if indicated 1. Scr actually continues to slowly improve.   2. Hyperkalemia- responded very well to IV lasix 80mg  x 1 with UF of 1.7 liters. Would switch to po and follow. 3. Hyponatremia- as above, pt with edema, and improved after IV Lasix x 1 and recheck later tomorrow. 1. recheck cortisol, tsh, UNa, Ucr, and follow. 4. Acute on chronic systolic CHF- improved edema and weight 5. Lower extremity weakness- PT/OT. Workup per Cards and Primary svc 1. Consider MRI of LS spine and/or Neuro evaluation 6. Severe protein malnutrition 7. 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 Arthralgias/myalgias- possible RA, also possible PMR, no significant improvement with steroids. negative TEE to r/oSBE. Also need to consider drug-induced lupus with hydralazine. Will add anti-histone ab. Elevated cortisol despite po prednisone, consider Cushing's syndrome work up. Agree with ID eval.  Tymere Depuy A

## 2014-06-10 ENCOUNTER — Inpatient Hospital Stay (HOSPITAL_COMMUNITY): Payer: Medicare Other

## 2014-06-10 LAB — EHRLICHIA ANTIBODY PANEL: E chaffeensis (HGE) Ab, IgG: <1:64 {titer}

## 2014-06-10 LAB — HEPATIC FUNCTION PANEL
ALT: 32 U/L (ref 0–53)
AST: 21 U/L (ref 0–37)
Albumin: 2.2 g/dL — ABNORMAL LOW (ref 3.5–5.2)
Alkaline Phosphatase: 72 U/L (ref 39–117)
BILIRUBIN DIRECT: 0.1 mg/dL (ref 0.0–0.5)
BILIRUBIN INDIRECT: 0.5 mg/dL (ref 0.3–0.9)
BILIRUBIN TOTAL: 0.6 mg/dL (ref 0.3–1.2)
Total Protein: 5.5 g/dL — ABNORMAL LOW (ref 6.0–8.3)

## 2014-06-10 LAB — BASIC METABOLIC PANEL
Anion gap: 12 (ref 5–15)
BUN: 145 mg/dL — ABNORMAL HIGH (ref 6–23)
CHLORIDE: 97 mmol/L (ref 96–112)
CO2: 25 mmol/L (ref 19–32)
CREATININE: 4.02 mg/dL — AB (ref 0.50–1.35)
Calcium: 7.4 mg/dL — ABNORMAL LOW (ref 8.4–10.5)
GFR calc Af Amer: 16 mL/min — ABNORMAL LOW (ref >90)
GFR calc non Af Amer: 13 mL/min — ABNORMAL LOW (ref >90)
Glucose, Bld: 161 mg/dL — ABNORMAL HIGH (ref 70–99)
POTASSIUM: 4 mmol/L (ref 3.5–5.1)
SODIUM: 134 mmol/L — AB (ref 135–145)

## 2014-06-10 LAB — HEPATITIS C ANTIBODY (REFLEX): HCV Ab: NEGATIVE

## 2014-06-10 LAB — GLUCOSE, CAPILLARY
GLUCOSE-CAPILLARY: 233 mg/dL — AB (ref 70–99)
Glucose-Capillary: 159 mg/dL — ABNORMAL HIGH (ref 70–99)
Glucose-Capillary: 145 mg/dL — ABNORMAL HIGH (ref 70–99)
Glucose-Capillary: 269 mg/dL — ABNORMAL HIGH (ref 70–99)

## 2014-06-10 LAB — B. BURGDORFI ANTIBODIES: B burgdorferi Ab IgG+IgM: <0.91 {ISR} (ref 0.00–0.90)

## 2014-06-10 LAB — CORTISOL: Cortisol, Plasma: 18.9 ug/dL

## 2014-06-10 LAB — SEDIMENTATION RATE: Sed Rate: 28 mm/hr — ABNORMAL HIGH (ref 0–16)

## 2014-06-10 MED ORDER — TECHNETIUM TC 99M MEDRONATE IV KIT
25.0000 | PACK | Freq: Once | INTRAVENOUS | Status: AC | PRN
  Administered 2014-06-10: 25 via INTRAVENOUS

## 2014-06-10 NOTE — Progress Notes (Signed)
Advanced Heart Failure Rounding Note   Subjective:    Afebrile. Prednisone switched to solumedrol yesterday. Feeling better. Still a bit sore in ankles. Has not walked yet.   TEE negative for endocarditis 2/29. EF 40-45%. Renal function starting to recover. Diuresing well.  Weight down 11 pounds.   Still feels miserable. No significant response to steroids  RF, Parvo B10, ANCA, bone scan, TEE, Lyme titers all negative to date. Skeletal survey done today. Results pending.   Objective:   Weight Range:  Vital Signs:   Temp:  [97.3 F (36.3 C)-98 F (36.7 C)] 98 F (36.7 C) (03/03 1345) Pulse Rate:  [65-74] 67 (03/03 1345) Resp:  [18] 18 (03/03 1345) BP: (124-152)/(55-63) 146/57 mmHg (03/03 1345) SpO2:  [98 %] 98 % (03/03 1345) Weight:  [106.1 kg (233 lb 14.5 oz)] 106.1 kg (233 lb 14.5 oz) (03/03 0437) Last BM Date: 06/09/14  Weight change: Filed Weights   06/09/14 0500 06/09/14 1201 06/10/14 0437  Weight: 109.5 kg (241 lb 6.5 oz) 107.1 kg (236 lb 1.8 oz) 106.1 kg (233 lb 14.5 oz)    Intake/Output:   Intake/Output Summary (Last 24 hours) at 06/10/14 1833 Last data filed at 06/10/14 1345  Gross per 24 hour  Intake    920 ml  Output   2250 ml  Net  -1330 ml     Physical Exam: General: Elderly lying in bed .No dyspnea HEENT: normal Neck: supple. JVP hard to see. Carotids 2+ bilat; no bruits. No lymphadenopathy or thryomegaly appreciated. Cor: PMI nondisplaced. Regular rate & rhythm. No rubs, gallops or murmurs. No S3.  Lungs: crackles in bases on 4 liters [place] Abdomen: soft, nontender, nondistended. No hepatosplenomegaly. No bruits or masses. Good bowel sounds. Extremities no cyanosis, clubbing, rash, R and LLE Tr edema. DPs trace bialterally Neuro: alert & orientedx3, cranial nerves grossly intact. moves all 4 extremities w/o difficulty. Affect pleasant  Telemetry:  SR   Labs: Basic Metabolic Panel:  Recent Labs Lab 06/03/14 2100 06/04/14 0327 06/05/14 0300  06/06/14 0230 06/07/14 0653 06/07/14 1820 06/08/14 0205 06/09/14 0309 06/10/14 0444  NA 131* 134* 132* 130* 127* 126* 128* 132* 134*  K 4.0 4.3 4.7 5.0 5.5* 5.6* 5.1 4.6 4.0  CL 98 97 96 94* 92* 93* 91* 96 97  CO_0  GLUCOSE 161* 191* 184* 204* 191* 336* 345* 212* 161*  BUN 59* 65* 84* 104* 126* 133* 140* 148* 145*  CREATININE 4.71* 5.01* 5.56* 5.71* 5.59* 5.37* 5.27* 4.36* 4.02*  CALCIUM 7.3* 7.5* 7.3* 7.3* 7.0* 6.8* 6.9* 7.1* 7.4*  MG  --  2.1 2.3 2.4 2.7*  --   --   --   --   PHOS 3.9  --   --   --   --   --  8.0*  --   --     Liver Function Tests:  Recent Labs Lab 06/04/14 0327 06/05/14 0300 06/06/14 0230 06/07/14 0653 06/08/14 0205 06/10/14 0444  AST 28 31 36 33  --  21  ALT 21 27 37 36  --  32  ALKPHOS 79 81 83 79  --  72  BILITOT 0.8 0.4 0.4 0.7  --  0.6  PROT 5.8* 5.6* 5.7* 5.9*  --  5.5*  ALBUMIN 1.9* 1.8* 1.9* 1.9* 1.9* 2.2*   No results for input(s): LIPASE, AMYLASE in the last 168 hours. No results for input(s): AMMONIA in the last 168 hours.  CBC:  Recent  Labs Lab 06/03/14 2100 06/04/14 0327 06/05/14 0300 06/06/14 0230 06/07/14 0653 06/08/14 0205 06/09/14 0309  WBC 8.1 9.3 8.0 6.7 5.9 4.4 7.6  NEUTROABS 6.3 7.5 6.9 5.9 5.0  --   --   HGB 10.1* 10.2* 10.0* 10.1* 10.9* 9.6* 10.8*  HCT 29.9* 30.7* 29.1* 29.3* 31.1* 27.7* 30.7*  MCV 86.2 86.7 85.1 84.2 83.8 82.4 83.4  PLT 174 191 204 239 PLATELET CLUMPS NOTED ON SMEAR, COUNT APPEARS ADEQUATE 259 313    Cardiac Enzymes:  Recent Labs Lab 06/03/14 2100  CKTOTAL 205    BNP: BNP (last 3 results) No results for input(s): BNP in the last 8760 hours.  ProBNP (last 3 results) No results for input(s): PROBNP in the last 8760 hours.    Other results:   Imaging: Dg Knee 1-2 Views Right  06/10/2014   CLINICAL DATA:  Severe lower body pain. No known injury. Bilateral hip pain. Trouble walking. Right lateral knee pain. Left medial knee pain.  EXAM: RIGHT KNEE - 1-2 VIEW   COMPARISON:  None.  FINDINGS: No acute fracture or dislocation. Moderate joint effusion. Tricompartmental osteoarthritis of the right knee most severe in the medial femorotibial compartment. Chondrocalcinosis of the lateral femorotibial compartment as can be seen with CPPD. There is peripheral vascular atherosclerotic disease.  IMPRESSION: No acute osseous injury of the right knee.  Tricompartmental osteoarthritis of the right knee.   Electronically Signed   By: Kathreen Devoid   On: 06/10/2014 18:27   Mr Lumbar Spine Wo Contrast  06/09/2014   CLINICAL DATA:  Focal lower extremity weakness. History of generalized weakness. No acute back pain, bowel or bladder symptoms. Initial encounter.  EXAM: MRI LUMBAR SPINE WITHOUT CONTRAST  TECHNIQUE: Multiplanar, multisequence MR imaging of the lumbar spine was performed. No intravenous contrast was administered.  COMPARISON:  Abdominal pelvic CT 08/13/2013.  FINDINGS: The lumbar spine examination is mildly motion degraded, especially on the axial images. CT demonstrates 5 lumbar type vertebral bodies. The alignment is stable and near anatomic. The marrow signal within the spine is mildly heterogeneous without focally suspicious lesion. There are foci of decreased T1 and T2 signal in the left iliac bone without clear corresponding finding on prior CT.  The conus medullaris extends to the T12-L1 level and appears normal. No focal paraspinal abnormalities are identified. There is subcutaneous edema in the back.  No significant disc space findings are seen from T10-11 through L2-3. There are paraspinal osteophytes anteriorly in the lower thoracic spine.  L3-4: Mild annular disc bulging. There is facet and ligamentous hypertrophy with bilateral facet joint effusions. These factors contribute to mild spinal stenosis with mild narrowing of both lateral recesses.  L4-5: Mild annular disc bulging. There is moderate facet and ligamentous hypertrophy with bilateral facet joint  effusions. These factors contribute to moderate spinal stenosis with asymmetric narrowing of the right lateral recess and right foramen. There is mild subchondral edema within the facet joints without bone destruction or periarticular fluid collection.  L5-S1: Disc height and hydration are maintained. Mild bilateral facet hypertrophy. No spinal stenosis or nerve root encroachment.  IMPRESSION: 1. Moderate facet and ligamentous hypertrophy with facet joint effusions and subchondral edema bilaterally at L3-4 and L4-5. 2. Associated annular disc bulging at both levels, contributing to mild spinal stenosis at L3-4 and moderate spinal stenosis at L4-5. 3. Nonspecific marrow lesions within the left iliac bone. Early metastatic disease cannot be completely excluded. No suspicious lesions demonstrated within the lumbar spine.   Electronically Signed  By: Richardean Sale M.D.   On: 06/09/2014 17:06   Nm Bone Scan Whole Body  06/10/2014   CLINICAL DATA:  Lower extremity weakness and pain. History of diabetes, hypertension and stroke. Indeterminate lesions in left iliac bone on MRI. No given history of malignancy.  EXAM: NUCLEAR MEDICINE WHOLE BODY BONE SCAN  TECHNIQUE: Whole body anterior and posterior images were obtained approximately 3 hours after intravenous injection of radiopharmaceutical.  RADIOPHARMACEUTICALS:  25.0 mCi Technetium-99 MDP  COMPARISON:  Lumbar spine and pelvic MRI 06/09/2014.  FINDINGS: There is no osseous activity suspicious for metastatic disease. Specifically, no abnormal activity is seen within the left iliac bone or remainder of the pelvis. There is scattered arthropathic activity within the large joints, primarily the shoulders, elbows and knees. There is also some arthropathic activity throughout the hands and feet bilaterally. Activity is present within the Foley catheter bag between lower legs.  IMPRESSION: No bone scan evidence of osseous metastatic disease. Scattered arthropathic activity.    Electronically Signed   By: Richardean Sale M.D.   On: 06/10/2014 15:42   Mr Sacrum/si Joints Wo Contrast  06/09/2014   CLINICAL DATA:  Focal lower extremity weakness. History of generalized weakness. No acute back pain, bowel or bladder symptoms. Initial encounter.  EXAM: MR SACRUM WITHOUT CONTRAST  TECHNIQUE: Multiplanar, multisequence MR imaging was performed. No intravenous contrast was administered.  COMPARISON:  Pelvic CT 08/13/2013.  FINDINGS: Lumbar spine findings dictated separately.  No evidence of sacral fracture or destruction. The sacroiliac joints are intact without erosive change. There is no presacral fluid collection. There is no sacral nerve foraminal compromise or nerve root encroachment.  There is mildly heterogeneous marrow signal within the sacrum and iliac bones on the T1 weighted images. No suspicious lesions demonstrated on T2 or inversion recovery imaging.  Foley catheter is in place. The bladder is decompressed and demonstrates mild wall thickening. There is nodularity throughout the peripheral and central portions of the prostate gland. Asymmetric fat in the right inguinal canal is incompletely visualized, although grossly stable. There is nonspecific subcutaneous edema dependently in the buttocks as well as mild perirectal edema.  IMPRESSION: 1. Negative MRI of the sacrum and sacroiliac joints. 2. No evidence of sacral nerve root encroachment. 3. Lumbar spine findings dictated separately.   Electronically Signed   By: Richardean Sale M.D.   On: 06/09/2014 16:54     Medications:     Scheduled Medications: . antiseptic oral rinse  7 mL Mouth Rinse BID  . clopidogrel  75 mg Oral Daily  . doxycycline  100 mg Oral Q12H  . heparin  5,000 Units Subcutaneous 3 times per day  . hydrALAZINE  5 mg Oral BID  . insulin aspart  0-20 Units Subcutaneous TID WC  . insulin glargine  18 Units Subcutaneous Daily  . isosorbide mononitrate  15 mg Oral Daily  . methylPREDNISolone  (SOLU-MEDROL) injection  60 mg Intravenous Q24H  . pregabalin  75 mg Oral Daily  . psyllium  1 packet Oral QHS  . sertraline  50 mg Oral QHS  . sodium chloride  3 mL Intravenous Q12H    Infusions:    PRN Medications: acetaminophen **OR** acetaminophen, diphenhydrAMINE, HYDROcodone-acetaminophen, hydrocortisone cream, ipratropium-albuterol, morphine injection, ondansetron (ZOFRAN) IV, oxymetazoline   Assessment:   1. A/C Systolic HF    --EF 28-76% 2. Acute Respiratory Failure- on Bipap now weaned to nasal cannula.  3. Hypertensive crisis 4. DM 5. Acute on CKD, stage IV - Creatinine baseline 2.5 6.  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 Chest Pain 1 week ago 8. Fever - ? Acute bronchitis vs flu 9. Acute gout - uric acid 8.3 10. Hyponatremia 11. Joint/muscle aches with ESR 135    -CK, RF, complement, ASO, blood cx negative.     - ANA, ANCA, lyme titers, SPEP/UPEP, CCP, parvo B19 (IgM) pending   Plan/Discussion:    Stable from HF perspective. Renal function improving and diuresing well .   On going work-up for fevers, severe joint and muscle aches with ESR 135 has been essentially negative. No clinical response to steroids though ESR down to 28.  I spoke to Dr. Amil Amen in Rheumatology who will review case for [place] but in light of lack of clincial response to steroids is concerned about underlying malignancy. He will f/u with me alter today or tomorrow. May be worth CT C/A/P (no contrast) to further evaluate. I will check PSA.     Benay Spice 6:33 PM

## 2014-06-10 NOTE — PMR Pre-admission (Signed)
PMR Admission Coordinator Pre-Admission Assessment  Patient: Darrell Richardson is an 74 y.o., male MRN: [id number] DOB: 09-10-1940 Height: 5' 8"  (172.7 cm) Weight: 103.874 kg (229 lb)              Insurance Information HMO:     PPO:      PCP:      IPA:      80/20: yes     OTHER: no HMO PRIMARY: Medicare a and b      Policy#: [id number]      Subscriber: pt Benefits:  Phone #: [email or web address] online     Name: 06/10/14 Eff. Date: 07/08/05     Deduct: $1288      Out of Pocket Max: none      Life Max: none CIR: 100%      SNF: 20 full days Outpatient: 80%     Co-Pay: 20% Home Health: 100%      Co-Pay: none DME: 80%     Co-Pay: 20% Providers: pt choice  SECONDARY: [employer] of [place] supplement      Policy#: [id number]      Subscriber: pt  Medicaid Application Date:       Case Manager:  Disability Application Date:       Case Worker:   Emergency Contact Information Contact Information    Name Relation Home Work [place]  [phone]     Nikoloz, Huy   [phone]   [place] Daughter   [phone]     Current Medical History  Patient Admitting Diagnosis: debility after CHF/respiratory failure. R/o polyarticular gout  History of Present Illness: Darrell Richardson is a 74 y.o. right handed male with history of CVA with residual left hemiparesis, diabetes mellitus peripheral neuropathy, gout, diastolic congestive heart failure, chronic renal insufficiency 2.44. Lives with his wife and used a cane prior to admission.   Presented to [facility]/31/2014 with increasing nonspecific chest pain and shortness of breath and transferred to [facility] for further medical evaluation. Complaints of increased shortness of breath and lower extremity edema. He was found to have acute respiratory failure secondary to acute CHF exacerbation placed on BiPAP. Troponin was negative. BNP 7363 Patient placed on Lasix. Echocardiogram with ejection fraction of 45% with akinesis of  the inferior lateral and inferior myocardium.   Progressive increase in creatinine function from baseline 2.4-3.9. Renal service is consulting. Renal ultrasound showed no hydronephrosis. Known Chronic kidney disease at baseline.    Complaints of joint pain low-grade fever with ESR 135 as well as uric acid level 8.3. Patient had been placed on prednisone therapy with workup concerning for PMR versus RA. It was felt prednisone was inducing increased renal function. TEE completed 06/07/2014 showing no vegetation moderate sized PFO with left-to-right shunting at rest. Began IV solumedrol. Felt overall feeling of unwellness may be due to BUN 143 and due to gout. Neurology consulted 3/3 and felt lower extremity weakness secondary to disuse and scans of lumbar spine and sacroliliac showing radiculopathy. Pt placed in IV solumedrol. Dr. Haroldine Laws has spoke with Dr. Amil Amen in Rheumatolgy who is reviewing pt's case in light of lack of clinical response to steroids and concerns for underlying malignancy.   Palliaative team consulted 3/3 for goals of care. He requested no intubation. Wanted better pain management, to pursue inpt rehab admission and discussing if he would ever want dialysis acutely or longterm or not.   Past Medical History  Past Medical History  Diagnosis Date  .  Hearing loss   . Gout   . Diabetes   . Fatigue   . Hypertension   . Stroke     X 2  . Heart failure   . Muscle pain   . Hammertoe     Family History  family history includes Dementia in his mother.  Prior Rehab/Hospitalizations: HH or OP only after CVA 2009 and 2010  Current Medications   Current facility-administered medications:  .  acetaminophen (TYLENOL) tablet 650 mg, 650 mg, Oral, Q6H PRN, 325 mg at 06/04/14 0114 **OR** acetaminophen (TYLENOL) suppository 650 mg, 650 mg, Rectal, Q6H PRN, Verlee Monte, MD .  antiseptic oral rinse (CPC / CETYLPYRIDINIUM CHLORIDE 0.05%) solution 7 mL, 7 mL, Mouth Rinse, BID, Verlee Monte, MD, 7 mL at 06/10/14 2218 .  clopidogrel (PLAVIX) tablet 75 mg, 75 mg, Oral, Daily, Verlee Monte, MD, 75 mg at 06/10/14 1032 .  diphenhydrAMINE (BENADRYL) capsule 25 mg, 25 mg, Oral, Q6H PRN, Amy D Clegg, NP, 25 mg at 06/01/14 1109 .  doxycycline (VIBRA-TABS) tablet 100 mg, 100 mg, Oral, Q12H, Jolaine Artist, MD, 100 mg at 06/10/14 2218 .  heparin injection 5,000 Units, 5,000 Units, Subcutaneous, 3 times per day, Verlee Monte, MD, 5,000 Units at 06/11/14 7209 .  hydrALAZINE (APRESOLINE) tablet 5 mg, 5 mg, Oral, BID, Allie Bossier, MD, 5 mg at 06/10/14 2217 .  HYDROcodone-acetaminophen (NORCO/VICODIN) 5-325 MG per tablet 1-2 tablet, 1-2 tablet, Oral, Q4H PRN, Verlee Monte, MD, 1 tablet at 06/11/14 0900 .  hydrocortisone cream 1 %, , Topical, QID PRN, Amy D Clegg, NP .  insulin aspart (novoLOG) injection 0-20 Units, 0-20 Units, Subcutaneous, TID WC, Cherene Altes, MD, 3 Units at 06/11/14 [phone] .  insulin glargine (LANTUS) injection 18 Units, 18 Units, Subcutaneous, Daily, Cherene Altes, MD, 18 Units at 06/10/14 1033 .  ipratropium-albuterol (DUONEB) 0.5-2.5 (3) MG/3ML nebulizer solution 3 mL, 3 mL, Nebulization, Q6H PRN, Allie Bossier, MD .  isosorbide mononitrate (IMDUR) 24 hr tablet 15 mg, 15 mg, Oral, Daily, Amy D Clegg, NP, 15 mg at 06/10/14 1050 .  methylPREDNISolone sodium succinate (SOLU-MEDROL) 125 mg/2 mL injection 125 mg, 125 mg, Intravenous, Q24H, Dawood Elgergawy, MD .  morphine 2 MG/ML injection 1 mg, 1 mg, Intravenous, Q4H PRN, Verlee Monte, MD, 1 mg at 06/10/14 1643 .  ondansetron (ZOFRAN) injection 4 mg, 4 mg, Intravenous, Q6H PRN, Jolaine Artist, MD, 4 mg at 06/04/14 0103 .  oxymetazoline (AFRIN) 0.05 % nasal spray 1 spray, 1 spray, Each Nare, BID PRN, Jolaine Artist, MD .  pregabalin (LYRICA) capsule 75 mg, 75 mg, Oral, Daily, Jolaine Artist, MD, 75 mg at 06/10/14 1036 .  psyllium (HYDROCIL/METAMUCIL) packet 1 packet, 1 packet, Oral, QHS, Jolaine Artist, MD, 1 packet at 06/09/14 2323 .  sertraline (ZOLOFT) tablet 50 mg, 50 mg, Oral, QHS, Jolaine Artist, MD, 50 mg at 06/10/14 2218 .  sodium chloride 0.9 % injection 3 mL, 3 mL, Intravenous, Q12H, Verlee Monte, MD, 3 mL at 06/10/14 2218  Patients Current Diet: Diet renal/carb modified with 1200 ml fluid restriction  Precautions / Restrictions Precautions Precautions: Fall Precaution Comments: significant pain above right knee and Bil ankles (possibly reminants of gout flare up) Restrictions Weight Bearing Restrictions: No   Prior Activity Level Pt used cane pta. Had walked to mail box without pain or difficulty pta. Had a ramp built at home for there were times that his knees would give him trouble to walk up  steps at times pta. Did own adls.  Home Assistive Devices / Equipment Home Assistive Devices/Equipment: CBG Meter, Dentures (specify type), Raised toilet seat with rails, Walker (specify type) Home Equipment: Gilford Rile - 2 wheels, Walker - 4 wheels, Cane - quad, [employer] - single point, Civil engineer, contracting, Grab bars - tub/shower  Prior Functional Level Prior Function Level of Independence: Independent Comments: occassional use of cane/walker if bad day  Current Functional Level Cognition  Overall Cognitive Status: Within Functional Limits for tasks assessed Orientation Level: Oriented X4    Extremity Assessment (includes Sensation/Coordination)  Upper Extremity Assessment: Overall WFL for tasks assessed  Lower Extremity Assessment: Defer to PT evaluation LLE Deficits / Details: modest residual stroke deficits, 3-/5    ADLs  Overall ADL's : Needs assistance/impaired Eating/Feeding: Independent, Sitting Grooming: Set up, Sitting Upper Body Bathing: Sitting, Moderate assistance Upper Body Bathing Details (indicate cue type and reason): pt with difficulty maintaining sitting balance during UB ADL Lower Body Bathing: Total assistance, Bed level Upper Body Dressing : Moderate  assistance, Sitting Upper Body Dressing Details (indicate cue type and reason): pt with difficulty maintaining sitting balance with UB dressing Lower Body Dressing: Total assistance, Bed level Toilet Transfer: Moderate assistance, +2 for physical assistance (bari stedy)    Mobility  Overal bed mobility: Needs Assistance, +2 for physical assistance Bed Mobility: Supine to Sit Supine to sit: Mod assist, HOB elevated General bed mobility comments: pt move Bil LEs to EOB first and then cued to reach for left upper rail with Bil UEs and try to scoot around more, but could not so A'd pt with bed pad at which point he c/o pf excruciating pain above right knee--I lifted RUE back up to full extension and held it there for approximately 30 seconds then was able to lower it back down and help pt sit up to EOB with A to elevate trunk and scoot to EOB with pad    Transfers  Overall transfer level: Needs assistance Equipment used: 2 person hand held assist Judie Petit stedy) Transfers: Sit to/from Stand (partial) Sit to Stand: +2 physical assistance, Total assist, From elevated surface Stand pivot transfers: +2 physical assistance, Total assist, From elevated surface General transfer comment: First attemtp pt not able to get up and forward enough to get seat of Jeralene Huff aroudn under him. On second attempt pt just barely got up and forward enough to clear buttocks enough from bed (raised) to get Jeralene Huff seat rotated around under him    Ambulation / Gait / Stairs / Wheelchair Mobility  Ambulation/Gait General Gait Details: unable to perform    Posture / Balance Dynamic Sitting Balance Sitting balance - Comments: Pt sat EOB x 20 minutes. Initially able to sit statically with supervision. Increased assist needed with functional activities and fatigue. Balance Overall balance assessment: Needs assistance Sitting-balance support: Bilateral upper extremity supported, Feet supported Sitting balance-Leahy Scale:  Poor Sitting balance - Comments: Pt sat EOB x 20 minutes. Initially able to sit statically with supervision. Increased assist needed with functional activities and fatigue. Postural control: Posterior lean Standing balance support: Bilateral upper extremity supported Standing balance-Leahy Scale: Zero Standing balance comment: Clarise Cruz Plus    Special needs/care consideration Bowel mgmt: continent last BM 3/2 Bladder mgmt: foley. Pt requesting removal asap Diabetic mgmt Hgb A1c 7.5 wife checks pt's cbgs at home and BP to manage his meds   Previous Home Environment Living Arrangements: Spouse/significant other  Lives With: Spouse Available Help at Discharge: Family Type of Home: House  Home Layout: One level Home Access: Ramped entrance Bathroom Shower/Tub: Multimedia programmer: Handicapped height Bathroom Accessibility: Yes How Accessible: Accessible via walker [place]: No  Discharge Living Setting Plans for Discharge Living Setting: Patient's home, Lives with (comment), Other (Comment) (wife) Type of Home at Discharge: House Discharge Home Layout: One level Discharge Home Access: [place] entrance Discharge Bathroom Shower/Tub: Walk-in shower Discharge Bathroom Toilet: Handicapped height Discharge Bathroom Accessibility: Yes How Accessible: Accessible via walker Does the patient have any problems obtaining your medications?: No  Social/Family/Support Systems Patient Roles: Spouse Contact Information: Taggart Prasad, wife Anticipated Caregiver: wife Anticipated Caregiver's Contact Information: see above Ability/Limitations of Caregiver: min assist level Caregiver Availability: 24/7 Discharge Plan Discussed with Primary Caregiver: Yes Is Caregiver In Agreement with Plan?: Yes Does Caregiver/Family have Issues with Lodging/Transportation while Pt is in Rehab?: No  Goals/Additional Needs Patient/Family Goal for Rehab: supervision to min assist with PT and  OT Expected length of stay: [facility] 15-20 days Pt/Family Agrees to Admission and willing to participate: Yes Program Orientation Provided & Reviewed with Pt/Caregiver Including Roles  & Responsibilities: Yes  Decrease burden of Care through IP rehab admission: n/a  Possible need for SNF placement upon discharge:not expected  Patient Condition: This patient's medical and functional status has changed since the consult dated: 06/08/2014 in which the Rehabilitation Physician determined and documented that the patient's condition is appropriate for intensive rehabilitative care in an inpatient rehabilitation facility. See "History of Present Illness" (above) for medical update. Functional changes are: overall max assist with transfers and adls. Patient's medical and functional status update has been discussed with the Rehabilitation physician and patient remains appropriate for inpatient rehabilitation. Will admit to inpatient rehab today.  Preadmission Screen Completed By:  [name], 06/11/2014 9:55 AM ______________________________________________________________________   Discussed status with Dr. Naaman Plummer on 06/11/2014 at  [phone] and received telephone approval for admission today.  Admission Coordinator:  [name], time 3557 Date 06/11/2014

## 2014-06-10 NOTE — Progress Notes (Signed)
I came to follow up with Mr. Zeoli and his wife but he is gone for his bone scan and his wife is out of room. I will follow up tomorrow.   Vinie Sill, NP Palliative Medicine Team Pager # [phone] (M-F 8a-5p) Team Phone # [phone] (Nights/Weekends)

## 2014-06-10 NOTE — Consult Note (Signed)
NEURO HOSPITALIST CONSULT NOTE    Reason for Consult: LE weakness  HPI:                                                                                                                                          Darrell Richardson is an 74 y.o. male presented to hospital for SOB, orthopnea and increased LE edema.  He and wife state he did have LE weakness prior to hospital admission.  For the past year he was able to walk on his own, do yard work (limited) at times able to use the stairs leading to his house but often would use his ramp.  He does have known joint pain and gout which limits his ability to maneuver but overall was able to get around well.  After admission He feels like he has quickly became weaker in both legs but also is complaining of back discomfort and increased gout pain.  He has a hard time describing his weakness but does state, if the pain were remove he "would be strong as an ox".  He now has been in hospital for 10 days and has not been able to take part in PT and barely moving his legs. He denies any numbness, tingling, neck pain or trauma. He denies muscle pain to palpation.  He also notes although he has had back pain in the past he has not been evaluated for this.     Past Medical History  Diagnosis Date  . Hearing loss   . Gout   . Diabetes   . Fatigue   . Hypertension   . Stroke     X 2  . Heart failure   . Muscle pain   . Hammertoe     Past Surgical History  Procedure Laterality Date  . Stomach surgery    . Foot surgery Right   . Tee without cardioversion N/A 06/07/2014    Procedure: TRANSESOPHAGEAL ECHOCARDIOGRAM (TEE);  Surgeon: Jolaine Artist, MD;  Location: [facility] ENDOSCOPY;  Service: Cardiovascular;  Laterality: N/A;    Family History  Problem Relation Age of Onset  . Dementia Mother      Social History:  reports that he has quit smoking. He does not have any smokeless tobacco history on file. He reports that he drinks alcohol.  He reports that he does not use illicit drugs.  Allergies  Allergen Reactions  . Cortisone Anaphylaxis and Other (See Comments)    Cold, elevated BP, shaking all over and fever  Wife says tolerates hydrocortisone cream at home  . Ambien [Zolpidem Tartrate]     Did not tolerate well or remembered anything from previous night  . Rocephin [Ceftriaxone] Hives  . Levaquin [Levofloxacin In D5w] Rash    MEDICATIONS:  Prior to Admission:  Prescriptions prior to admission  Medication Sig Dispense Refill Last Dose  . albuterol (PROAIR HFA) 108 (90 BASE) MCG/ACT inhaler Inhale 2 puffs into the lungs every 4 (four) hours as needed for wheezing or shortness of breath. Inhale 1-2 puffs four times daily as needed   05/30/2014 at Unknown time  . allopurinol (ZYLOPRIM) 100 MG tablet Take 100 mg by mouth 2 (two) times daily.   05/30/2014 at Unknown time  . amLODipine (NORVASC) 5 MG tablet Take 5 mg by mouth 2 (two) times daily.   05/30/2014 at Unknown time  . atorvastatin (LIPITOR) 20 MG tablet Take 40 mg by mouth daily.    05/30/2014 at Unknown time  . Cholecalciferol (VITAMIN D3) 1000 UNITS CAPS Take 1,000 Units by mouth daily.   05/30/2014 at Unknown time  . clopidogrel (PLAVIX) 75 MG tablet Take 75 mg by mouth daily with breakfast.   05/30/2014 at Unknown time  . docusate sodium (COLACE) 100 MG capsule Take 100 mg by mouth at bedtime.   05/30/2014 at Unknown time  . furosemide (LASIX) 80 MG tablet Take 80 mg by mouth daily.   05/30/2014 at Unknown time  . HYDROcodone-acetaminophen (VICODIN ES) 7.5-750 MG per tablet Take 1 tablet by mouth every 6 (six) hours as needed for pain. Take 1/2 - 1 four times a day as needed   unknown  . losartan (COZAAR) 50 MG tablet Take 50 mg by mouth daily.   05/30/2014 at Unknown time  . metoprolol succinate (TOPROL-XL) 25 MG 24 hr tablet Take 25 mg by mouth 2 (two)  times daily.    05/30/2014 at 1900  . Multiple Vitamin (MULTIVITAMIN) tablet Take 1 tablet by mouth daily.   05/30/2014 at Unknown time  . pregabalin (LYRICA) 75 MG capsule Take 75 mg by mouth 2 (two) times daily.   05/30/2014 at Unknown time  . Psyllium (METAMUCIL PO) Take by mouth at bedtime.   05/30/2014 at Unknown time  . sertraline (ZOLOFT) 50 MG tablet Take 50 mg by mouth at bedtime.   05/30/2014 at Unknown time  . sitaGLIPtin (JANUVIA) 50 MG tablet Take 50 mg by mouth daily.   05/30/2014 at Unknown time  . vitamin E 200 UNIT capsule Take 200 Units by mouth daily.   05/30/2014 at Unknown time   Scheduled: . antiseptic oral rinse  7 mL Mouth Rinse BID  . clopidogrel  75 mg Oral Daily  . doxycycline  100 mg Oral Q12H  . heparin  5,000 Units Subcutaneous 3 times per day  . hydrALAZINE  5 mg Oral BID  . insulin aspart  0-20 Units Subcutaneous TID WC  . insulin glargine  18 Units Subcutaneous Daily  . isosorbide mononitrate  15 mg Oral Daily  . methylPREDNISolone (SOLU-MEDROL) injection  60 mg Intravenous Q24H  . pregabalin  75 mg Oral Daily  . psyllium  1 packet Oral QHS  . sertraline  50 mg Oral QHS  . sodium chloride  3 mL Intravenous Q12H   Continuous:    ROS:  History obtained from the patient  General ROS: negative for - chills, fatigue, fever, night sweats, weight gain or weight loss Psychological ROS: negative for - behavioral disorder, hallucinations, memory difficulties, mood swings or suicidal ideation Ophthalmic ROS: negative for - blurry vision, double vision, eye pain or loss of vision ENT ROS: negative for - epistaxis, nasal discharge, oral lesions, sore throat, tinnitus or vertigo Allergy and Immunology ROS: negative for - hives or itchy/watery eyes Hematological and Lymphatic ROS: negative for - bleeding problems, bruising or swollen lymph  nodes Endocrine ROS: negative for - galactorrhea, hair pattern changes, polydipsia/polyuria or temperature intolerance Respiratory ROS: negative for - cough, hemoptysis, shortness of breath or wheezing Cardiovascular ROS: negative for - chest pain, dyspnea on exertion, edema or irregular heartbeat Gastrointestinal ROS: negative for - abdominal pain, diarrhea, hematemesis, nausea/vomiting or stool incontinence Genito-Urinary ROS: negative for - dysuria, hematuria, incontinence or urinary frequency/urgency Musculoskeletal ROS: negative for - joint swelling or muscular weakness Neurological ROS: as noted in HPI Dermatological ROS: negative for rash and skin lesion changes   Blood pressure 124/55, pulse 69, temperature 97.4 F (36.3 C), temperature source Oral, resp. rate 18, height 5\' 8"  (1.727 m), weight 106.1 kg (233 lb 14.5 oz), SpO2 98 %.   Neurologic Examination:                                                                                                      HEENT-  Normocephalic, no lesions, without obvious abnormality.  Normal external eye and conjunctiva.  Normal TM's bilaterally.  Normal auditory canals and external ears. Normal external nose, mucus membranes and septum.  Normal pharynx. Cardiovascular- S1, S2 normal, pulses palpable throughout   Lungs- chest clear, no wheezing, rales, normal symmetric air entry, Heart exam - S1, S2 normal, no murmur, no gallop, rate regular Abdomen- normal findings: bowel sounds normal Extremities- mild LE edema Lymph-no adenopathy palpable Musculoskeletal- joint tenderness noted in hands and in feet Skin-warm and dry, no hyperpigmentation, vitiligo, or suspicious lesions  Neurological Examination Mental Status: Alert, oriented, thought content appropriate.  Speech fluent without evidence of aphasia.  Able to follow 3 step commands without difficulty. Cranial Nerves: II: Discs flat bilaterally; Visual fields grossly normal, pupils equal,  round, reactive to light and accommodation III,IV, VI: ptosis not present, extra-ocular motions intact bilaterally V,VII: smile symmetric, facial light touch sensation normal bilaterally VIII: hearing normal bilaterally IX,X: uvula rises symmetrically XI: bilateral shoulder shrug XII: midline tongue extension Motor: Right : Upper extremity   5/5    Left:     Upper extremity   5/5  4/5 strength in bilateral LE but limited by back pain and in ankle pain from gout Tone and bulk:normal tone throughout; no atrophy noted Sensory: Pinprick and light touch intact throughout, bilaterally with noted bilaterarl stocking distribution decreased temperature and vibratory sensation.  Deep Tendon Reflexes: 1+ and symmetric throughout UE and KJ no AJ Plantars: Right: downgoing   Left: up going Cerebellar: normal finger-to-nose, unable to obtain H-S due to back pain.  Gait: unable to assess due to weakness.  Lab Results: Basic Metabolic Panel:  Recent Labs Lab 06/03/14 2100 06/04/14 0327 06/05/14 0300 06/06/14 0230 06/07/14 0653 06/07/14 1820 06/08/14 0205 06/09/14 0309 06/10/14 0444  NA 131* 134* 132* 130* 127* 126* 128* 132* 134*  K 4.0 4.3 4.7 5.0 5.5* 5.6* 5.1 4.6 4.0  CL 98 97 96 94* 92* 93* 91* 96 97  CO2 23 22 23 19 23 21 22 21 25   GLUCOSE 161* 191* 184* 204* 191* 336* 345* 212* 161*  BUN 59* 65* 84* 104* 126* 133* 140* 148* 145*  CREATININE 4.71* 5.01* 5.56* 5.71* 5.59* 5.37* 5.27* 4.36* 4.02*  CALCIUM 7.3* 7.5* 7.3* 7.3* 7.0* 6.8* 6.9* 7.1* 7.4*  MG  --  2.1 2.3 2.4 2.7*  --   --   --   --   PHOS 3.9  --   --   --   --   --  8.0*  --   --     Liver Function Tests:  Recent Labs Lab 06/04/14 0327 06/05/14 0300 06/06/14 0230 06/07/14 0653 06/08/14 0205 06/10/14 0444  AST 28 31 36 33  --  21  ALT 21 27 37 36  --  32  ALKPHOS 79 81 83 79  --  72  BILITOT 0.8 0.4 0.4 0.7  --  0.6  PROT 5.8* 5.6* 5.7* 5.9*  --  5.5*  ALBUMIN 1.9* 1.8* 1.9* 1.9* 1.9* 2.2*   No  results for input(s): LIPASE, AMYLASE in the last 168 hours. No results for input(s): AMMONIA in the last 168 hours.  CBC:  Recent Labs Lab 06/03/14 2100 06/04/14 0327 06/05/14 0300 06/06/14 0230 06/07/14 0653 06/08/14 0205 06/09/14 0309  WBC 8.1 9.3 8.0 6.7 5.9 4.4 7.6  NEUTROABS 6.3 7.5 6.9 5.9 5.0  --   --   HGB 10.1* 10.2* 10.0* 10.1* 10.9* 9.6* 10.8*  HCT 29.9* 30.7* 29.1* 29.3* 31.1* 27.7* 30.7*  MCV 86.2 86.7 85.1 84.2 83.8 82.4 83.4  PLT 174 191 204 239 PLATELET CLUMPS NOTED ON SMEAR, COUNT APPEARS ADEQUATE 259 313    Cardiac Enzymes:  Recent Labs Lab 06/03/14 2100  CKTOTAL 205    Lipid Panel: No results for input(s): CHOL, TRIG, HDL, CHOLHDL, VLDL, LDLCALC in the last 168 hours.  CBG:  Recent Labs Lab 06/09/14 1246 06/09/14 1650 06/09/14 1928 06/10/14 0610 06/10/14 1130  GLUCAP 195* 209* 208* 159* 145*    Microbiology: Results for orders placed or performed during the hospital encounter of 05/31/14  MRSA PCR Screening     Status: None   Collection Time: 05/31/14 10:55 AM  Result Value Ref Range Status   MRSA by PCR NEGATIVE NEGATIVE Final    Comment:        The GeneXpert MRSA Assay (FDA approved for NASAL specimens only), is one component of a comprehensive MRSA colonization surveillance program. It is not intended to diagnose MRSA infection nor to guide or monitor treatment for MRSA infections.   Respiratory virus panel (routine influenza)     Status: None   Collection Time: 06/01/14  9:01 PM  Result Value Ref Range Status   Source - RVPAN NASAL SWAB  Corrected   Respiratory Syncytial Virus A Negative Negative Final   Respiratory Syncytial Virus B Negative Negative Final   Influenza A Negative Negative Final   Influenza B Negative Negative Final   Parainfluenza 1 Negative Negative Final   Parainfluenza 2 Negative Negative Final   Parainfluenza 3 Negative Negative Final   Metapneumovirus Negative Negative Final   Rhinovirus  Negative  Negative Final   Adenovirus Negative Negative Final    Comment: (NOTE) Performed At: [facility] [place], [place] [id number] Lindon Romp MD WU:[id number]   Culture, blood (routine x 2)     Status: None   Collection Time: 06/01/14 10:33 PM  Result Value Ref Range Status   Specimen Description BLOOD RIGHT HAND  Final   Special Requests BOTTLES DRAWN AEROBIC ONLY 5CC  Final   Culture   Final    NO GROWTH 5 DAYS Performed at [facility]    Report Status 06/08/2014 FINAL  Final  Culture, blood (routine x 2)     Status: None   Collection Time: 06/01/14 10:45 PM  Result Value Ref Range Status   Specimen Description BLOOD LEFT HAND  Final   Special Requests BOTTLES DRAWN AEROBIC ONLY 4CC  Final   Culture   Final    NO GROWTH 5 DAYS Performed at [facility]    Report Status 06/08/2014 FINAL  Final    Coagulation Studies: No results for input(s): LABPROT, INR in the last 72 hours.  Imaging: Mr Lumbar Spine Wo Contrast  06/09/2014   CLINICAL DATA:  Focal lower extremity weakness. History of generalized weakness. No acute back pain, bowel or bladder symptoms. Initial encounter.  EXAM: MRI LUMBAR SPINE WITHOUT CONTRAST  TECHNIQUE: Multiplanar, multisequence MR imaging of the lumbar spine was performed. No intravenous contrast was administered.  COMPARISON:  Abdominal pelvic CT 08/13/2013.  FINDINGS: The lumbar spine examination is mildly motion degraded, especially on the axial images. CT demonstrates 5 lumbar type vertebral bodies. The alignment is stable and near anatomic. The marrow signal within the spine is mildly heterogeneous without focally suspicious lesion. There are foci of decreased T1 and T2 signal in the left iliac bone without clear corresponding finding on prior CT.  The conus medullaris extends to the T12-L1 level and appears normal. No focal paraspinal abnormalities are identified. There is subcutaneous edema in the back.  No  significant disc space findings are seen from T10-11 through L2-3. There are paraspinal osteophytes anteriorly in the lower thoracic spine.  L3-4: Mild annular disc bulging. There is facet and ligamentous hypertrophy with bilateral facet joint effusions. These factors contribute to mild spinal stenosis with mild narrowing of both lateral recesses.  L4-5: Mild annular disc bulging. There is moderate facet and ligamentous hypertrophy with bilateral facet joint effusions. These factors contribute to moderate spinal stenosis with asymmetric narrowing of the right lateral recess and right foramen. There is mild subchondral edema within the facet joints without bone destruction or periarticular fluid collection.  L5-S1: Disc height and hydration are maintained. Mild bilateral facet hypertrophy. No spinal stenosis or nerve root encroachment.  IMPRESSION: 1. Moderate facet and ligamentous hypertrophy with facet joint effusions and subchondral edema bilaterally at L3-4 and L4-5. 2. Associated annular disc bulging at both levels, contributing to mild spinal stenosis at L3-4 and moderate spinal stenosis at L4-5. 3. Nonspecific marrow lesions within the left iliac bone. Early metastatic disease cannot be completely excluded. No suspicious lesions demonstrated within the lumbar spine.   Electronically Signed   By: Richardean Sale M.D.   On: 06/09/2014 17:06   Mr Sacrum/si Joints Wo Contrast  06/09/2014   CLINICAL DATA:  Focal lower extremity weakness. History of generalized weakness. No acute back pain, bowel or bladder symptoms. Initial encounter.  EXAM: MR SACRUM WITHOUT CONTRAST  TECHNIQUE: Multiplanar, multisequence MR imaging was performed. No intravenous contrast was administered.  COMPARISON:  Pelvic CT 08/13/2013.  FINDINGS: Lumbar spine findings dictated separately.  No evidence of sacral fracture or destruction. The sacroiliac joints are intact without erosive change. There is no presacral fluid collection. There is  no sacral nerve foraminal compromise or nerve root encroachment.  There is mildly heterogeneous marrow signal within the sacrum and iliac bones on the T1 weighted images. No suspicious lesions demonstrated on T2 or inversion recovery imaging.  Foley catheter is in place. The bladder is decompressed and demonstrates mild wall thickening. There is nodularity throughout the peripheral and central portions of the prostate gland. Asymmetric fat in the right inguinal canal is incompletely visualized, although grossly stable. There is nonspecific subcutaneous edema dependently in the buttocks as well as mild perirectal edema.  IMPRESSION: 1. Negative MRI of the sacrum and sacroiliac joints. 2. No evidence of sacral nerve root encroachment. 3. Lumbar spine findings dictated separately.   Electronically Signed   By: Richardean Sale M.D.   On: 06/09/2014 16:54       Assessment and plan per attending neurologist  Etta Quill PA-C [facility] [phone]  06/10/2014, 1:19 PM   Assessment/Plan: 74 yo M with polyarthralgia. He self-immobilizes due to pain and therefore has not only been bed-bound, but essentially immobile. His weakness I suspect is disuse, though he is at high risk for myopathy given the immobilization with steroid use. I think that his primary issue is his polyarthralgia and would consider further investigation/treatment of this as I would be very concerned for him becoming severely debilitated if he continues to be this immobilized.   His exam is mostly that of limitation due to pain rather than true weakness.   1) Would treat pain aggressively to allow mobilization.  2) Consider rheumatological workup.   Roland Rack, MD [facility] [phone]  If 7pm- 7am, please page neurology on call as listed in [place].

## 2014-06-10 NOTE — Progress Notes (Signed)
Patient ID: Darrell Richardson, male   DOB: 13-Jul-1940, 74 y.o.   MRN: [id number] S:feels very weak and having pains in his legs O:BP 152/63 mmHg  Pulse 69  Temp(Src) 97.4 F (36.3 C) (Oral)  Resp 18  Ht 5\' 8"  (1.727 m)  Wt 106.1 kg (233 lb 14.5 oz)  BMI 35.57 kg/m2  SpO2 98%  Intake/Output Summary (Last 24 hours) at 06/10/14 1035 Last data filed at 06/10/14 0825  Gross per 24 hour  Intake    770 ml  Output   2275 ml  Net  -1505 ml   Intake/Output: I/O last 3 completed shifts: In: 920 [P.O.:920] Out: 3980 [Urine:3980]  Intake/Output this shift:  Total I/O In: 50 [P.O.:50] Out: -  Weight change: -2.4 kg (-5 lb 4.7 oz) Gen:WD elderly WM in mild distress CVS:no rub Resp:cta YHC:WCBJSE Ext:+pedal edema   Recent Labs Lab 06/03/14 2100 06/04/14 0327 06/05/14 0300 06/06/14 0230 06/07/14 0653 06/07/14 1820 06/08/14 0205 06/09/14 0309 06/10/14 0444  NA 131* 134* 132* 130* 127* 126* 128* 132* 134*  K 4.0 4.3 4.7 5.0 5.5* 5.6* 5.1 4.6 4.0  CL 98 97 96 94* 92* 93* 91* 96 97  CO2 23 22 23 19 23 21 22 21 25   GLUCOSE 161* 191* 184* 204* 191* 336* 345* 212* 161*  BUN 59* 65* 84* 104* 126* 133* 140* 148* 145*  CREATININE 4.71* 5.01* 5.56* 5.71* 5.59* 5.37* 5.27* 4.36* 4.02*  ALBUMIN 1.9* 1.9* 1.8* 1.9* 1.9*  --  1.9*  --  2.2*  CALCIUM 7.3* 7.5* 7.3* 7.3* 7.0* 6.8* 6.9* 7.1* 7.4*  PHOS 3.9  --   --   --   --   --  8.0*  --   --   AST 27 28 31  36 33  --   --   --  21  ALT 17 21 27  37 36  --   --   --  32   Liver Function Tests:  Recent Labs Lab 06/06/14 0230 06/07/14 0653 06/08/14 0205 06/10/14 0444  AST 36 33  --  21  ALT 37 36  --  32  ALKPHOS 83 79  --  72  BILITOT 0.4 0.7  --  0.6  PROT 5.7* 5.9*  --  5.5*  ALBUMIN 1.9* 1.9* 1.9* 2.2*   No results for input(s): LIPASE, AMYLASE in the last 168 hours. No results for input(s): AMMONIA in the last 168 hours. CBC:  Recent Labs Lab 06/05/14 0300 06/06/14 0230 06/07/14 0653 06/08/14 0205 06/09/14 0309  WBC  8.0 6.7 5.9 4.4 7.6  NEUTROABS 6.9 5.9 5.0  --   --   HGB 10.0* 10.1* 10.9* 9.6* 10.8*  HCT 29.1* 29.3* 31.1* 27.7* 30.7*  MCV 85.1 84.2 83.8 82.4 83.4  PLT 204 239 PLATELET CLUMPS NOTED ON SMEAR, COUNT APPEARS ADEQUATE 259 313   Cardiac Enzymes:  Recent Labs Lab 06/03/14 2100  CKTOTAL 205   CBG:  Recent Labs Lab 06/09/14 0816 06/09/14 1246 06/09/14 1650 06/09/14 1928 06/10/14 0610  GLUCAP 148* 195* 209* 208* 159*    Iron Studies: No results for input(s): IRON, TIBC, TRANSFERRIN, FERRITIN in the last 72 hours. Studies/Results: Mr Lumbar Spine Wo Contrast  06/09/2014   CLINICAL DATA:  Focal lower extremity weakness. History of generalized weakness. No acute back pain, bowel or bladder symptoms. Initial encounter.  EXAM: MRI LUMBAR SPINE WITHOUT CONTRAST  TECHNIQUE: Multiplanar, multisequence MR imaging of the lumbar spine was performed. No intravenous contrast was administered.  COMPARISON:  Abdominal pelvic CT 08/13/2013.  FINDINGS: The lumbar spine examination is mildly motion degraded, especially on the axial images. CT demonstrates 5 lumbar type vertebral bodies. The alignment is stable and near anatomic. The marrow signal within the spine is mildly heterogeneous without focally suspicious lesion. There are foci of decreased T1 and T2 signal in the left iliac bone without clear corresponding finding on prior CT.  The conus medullaris extends to the T12-L1 level and appears normal. No focal paraspinal abnormalities are identified. There is subcutaneous edema in the back.  No significant disc space findings are seen from T10-11 through L2-3. There are paraspinal osteophytes anteriorly in the lower thoracic spine.  L3-4: Mild annular disc bulging. There is facet and ligamentous hypertrophy with bilateral facet joint effusions. These factors contribute to mild spinal stenosis with mild narrowing of both lateral recesses.  L4-5: Mild annular disc bulging. There is moderate facet and  ligamentous hypertrophy with bilateral facet joint effusions. These factors contribute to moderate spinal stenosis with asymmetric narrowing of the right lateral recess and right foramen. There is mild subchondral edema within the facet joints without bone destruction or periarticular fluid collection.  L5-S1: Disc height and hydration are maintained. Mild bilateral facet hypertrophy. No spinal stenosis or nerve root encroachment.  IMPRESSION: 1. Moderate facet and ligamentous hypertrophy with facet joint effusions and subchondral edema bilaterally at L3-4 and L4-5. 2. Associated annular disc bulging at both levels, contributing to mild spinal stenosis at L3-4 and moderate spinal stenosis at L4-5. 3. Nonspecific marrow lesions within the left iliac bone. Early metastatic disease cannot be completely excluded. No suspicious lesions demonstrated within the lumbar spine.   Electronically Signed   By: Richardean Sale M.D.   On: 06/09/2014 17:06   Mr Sacrum/si Joints Wo Contrast  06/09/2014   CLINICAL DATA:  Focal lower extremity weakness. History of generalized weakness. No acute back pain, bowel or bladder symptoms. Initial encounter.  EXAM: MR SACRUM WITHOUT CONTRAST  TECHNIQUE: Multiplanar, multisequence MR imaging was performed. No intravenous contrast was administered.  COMPARISON:  Pelvic CT 08/13/2013.  FINDINGS: Lumbar spine findings dictated separately.  No evidence of sacral fracture or destruction. The sacroiliac joints are intact without erosive change. There is no presacral fluid collection. There is no sacral nerve foraminal compromise or nerve root encroachment.  There is mildly heterogeneous marrow signal within the sacrum and iliac bones on the T1 weighted images. No suspicious lesions demonstrated on T2 or inversion recovery imaging.  Foley catheter is in place. The bladder is decompressed and demonstrates mild wall thickening. There is nodularity throughout the peripheral and central portions of the  prostate gland. Asymmetric fat in the right inguinal canal is incompletely visualized, although grossly stable. There is nonspecific subcutaneous edema dependently in the buttocks as well as mild perirectal edema.  IMPRESSION: 1. Negative MRI of the sacrum and sacroiliac joints. 2. No evidence of sacral nerve root encroachment. 3. Lumbar spine findings dictated separately.   Electronically Signed   By: Richardean Sale M.D.   On: 06/09/2014 16:54   . antiseptic oral rinse  7 mL Mouth Rinse BID  . clopidogrel  75 mg Oral Daily  . doxycycline  100 mg Oral Q12H  . heparin  5,000 Units Subcutaneous 3 times per day  . hydrALAZINE  5 mg Oral BID  . insulin aspart  0-20 Units Subcutaneous TID WC  . insulin glargine  18 Units Subcutaneous Daily  . isosorbide mononitrate  15 mg Oral Daily  . methylPREDNISolone (SOLU-MEDROL)  injection  60 mg Intravenous Q24H  . pregabalin  75 mg Oral Daily  . psyllium  1 packet Oral QHS  . sertraline  50 mg Oral QHS  . sodium chloride  3 mL Intravenous Q12H    BMET    Component Value Date/Time   NA 134* 06/10/2014 0444   K 4.0 06/10/2014 0444   CL 97 06/10/2014 0444   CO2 25 06/10/2014 0444   GLUCOSE 161* 06/10/2014 0444   BUN 145* 06/10/2014 0444   CREATININE 4.02* 06/10/2014 0444   CALCIUM 7.4* 06/10/2014 0444   GFRNONAA 13* 06/10/2014 0444   GFRAA 16* 06/10/2014 0444   CBC    Component Value Date/Time   WBC 7.6 06/09/2014 0309   RBC 3.68* 06/09/2014 0309   HGB 10.8* 06/09/2014 0309   HCT 30.7* 06/09/2014 0309   PLT 313 06/09/2014 0309   MCV 83.4 06/09/2014 0309   MCH 29.3 06/09/2014 0309   MCHC 35.2 06/09/2014 0309   RDW 12.5 06/09/2014 0309   LYMPHSABS 0.3* 06/07/2014 0653   MONOABS 0.6 06/07/2014 0653   EOSABS 0.0 06/07/2014 0653   BASOSABS 0.0 06/07/2014 0653     Assessment/Plan:  1. AKI/CKD- in setting of decompensated CHF and hypotension. Non-oliguric. BUN rising out of proportion to Scr,presumably due to prednisone therapy and  diuresis. Will need to follow closely for any signs of uremia. Discussed the chronicity and severity of his CKD with he and his wife. He would be willing to proceed with HD if indicated 1. Scr actually continues to slowly improve.  2. Hyperkalemia- resolved 3. Hyponatremia- as above, pt with edema, and improved after IV Lasix x 1 and continues to improve with returning renal function and auto-diuresis. 4. Acute on chronic systolic CHF- improved edema and weight without more lasix at this point.  Continue to follow. 5. Lower extremity weakness- PT/OT. Workup per Cards and Primary svc 1. MRI of LS spine revealed some bulging disc and some spinal stenosis.  Also concern over bone marrow abnormalities.  awaiting Neuro evaluation 6. Severe protein malnutrition 7. SHPTH- await repeat Phos levels and iPTH but will likely require phosphate binders. 8. 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 Arthralgias/myalgias- possible RA, also possible PMR, no significant improvement with steroids. negative TEE to r/oSBE.  1. Negative anti-histone DNA, RMSF, +parvovirus B19 IgG (neg IgM)  Shelbe Haglund A

## 2014-06-10 NOTE — Progress Notes (Signed)
TEAM 1 - Stepdown/ICU TEAM Progress Note  Darrell Richardson QPR:[id number] DOB: 05/08/1940 DOA: 05/31/2014 PCP: [name], MD  Admit HPI / Brief Narrative: 74 y.o. M Hx diabetes type II uncontrolled, CHF, HTN, CKD, and CVA w/ residual Lt Hemiparesis who presented to [facility] complaining of shortness of breath. He was found to be in acute respiratory failure secondary to an acute CHF exacerbation, placed on BiPAP, and sent to [facility] for further evaluation. Acute respiratory failure much improved, she is currently back on room air.  In the ED troponin was negative, creatinine 2.44, hemoglobin 13.3. BNP was 7363. Patient was given 1 dose of Lasix and sent for further evaluation. - Patient is known to have chronic kidney disease at baseline, function continues to worsen during hospital stay, patient has been followed by nephrology service.  HPI/Subjective: Today reports no chest pain, no abdom pain.  Reports lower extremity weakness,  as well still complains of joint pain mainly in bilateral lower ankles and knees.  He can move his legs, and has not lost feeling in his legs.  He denies sob, n/v, or HA.    Assessment/Plan:  Initial Fever / Arthralgias / myalgias -differential is very wide, and includes autoimmune disease including PMR , dermatomyositis, vasculitis, rheumatoid arthritis( unlikely with normal rheumatoid factor) , hydralazine induced lupus not likely w/ negative histone ab versus possible infectious etiology including RMSF v/s Lyme v/s ehrlichiosis seems reasonable, - ANCA studies normal - in that pt is afebrile, and improving,-resp viral panel negative for influenza or URI, versus paraneoplastic syndrome especially with finding in the left iliac suspicious for early metastasis. - Started empirically on IV Solu-Medrol 06/09/14, was on oral prednisone 2/29 -ESR 135 - repeat is 28 -it is also possible that his overall feeling of unwellness is due  to his BUN of 143, and that his arthralgias are due to his known gout  -he has not had a true fever since 2/24 -Lower extremity weakness thought to be secondary to disuse by neurology, continue with PT/OT, MRI of lumbar spine and sacroiliac showing radiculopathy. - We'll obtain nuclear whole body scan for further evaluation of marrow lesion within the left iliac bone.  Acute on chronic respiratory failure with hypoxia  -BIPAP now weaned to [place] O2, which has since been weaned to RA   Acute renal failure on CKD stage III  Baseline crt 2.5-2.8 - overall renal fxn has worsened significantly during this hospital stay, but his crt is presently slowly improving - Nephrology following  Hyperkalemia  -improved w/ diuresis - cont to follow   Hyponatremia -improved w/ diuresis - cont to follow   Cardiomyopathy / 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 CHF -care as per CHF Team - appears well compensated at present   Hypertension -BP currently stable, but climbing - follow w/o change today, but may soon require titration of meds   Diabetes mellitus type 2 uncontrolled -A1c 7.5 - CBG climbing w/ use of steroids - adjust tx again and follow   Urinary retention  -empiric tx for presumed BPH - begin voiding trials after renal function improves further    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 -LVEF= 40% to 45%. akinesis of inferolateral and inferior myocardium -grade 2 diastolic dysfunction - Left atrium:  severely dilated - Pericardium, extracardiac: A small pericardial effusion was identified 2/29 TEE - No TEE evidence of endocarditis - EF = 40-45%  Antibiotics: Azithromycin 2/23 > 2/24 Vancomycin 2/23 > 2/24 Doxy 3/1 >  DVT prophylaxis: Subcutaneous heparin  Objective: Blood pressure 146/57, pulse 67, temperature 98 F (36.7 C), temperature source Oral, resp. rate 18, height _0  (1.727 m), weight 106.1 kg (233 lb 14.5 oz), SpO2 98 %.  Intake/Output Summary (Last 24 hours) at 06/10/14 1441 Last data filed at 06/10/14 1345  Gross per 24 hour  Intake   1140 ml  Output   2350 ml  Net  -1210 ml   Exam: General: alert and conversant - no acute resp distress  Lungs: Clear to auscultation bilaterally without wheezes or crackles Cardiovascular: Regular rate and rhythm without murmur gallop or rub  Abdomen: Nontender, nondistended, soft, bowel sounds positive, no rebound, no ascites, no appreciable mass Extremities: No significant cyanosis, clubbing, or edema bilateral lower extremities - no joint erythema or effusions noted B LE  Neuro:  4/5 strength B LE - intact sensation touch th/o , movement limited due to pain  Data Reviewed: Basic Metabolic Panel:  Recent Labs Lab 06/03/14 2100 06/04/14 0327 06/05/14 0300 06/06/14 0230 06/07/14 0653 06/07/14 1820 06/08/14 0205 06/09/14 0309 06/10/14 0444  NA 131* 134* 132* 130* 127* 126* 128* 132* 134*  K 4.0 4.3 4.7 5.0 5.5* 5.6* 5.1 4.6 4.0  CL 98 97 96 94* 92* 93* 91* 96 97  CO2 _1 GLUCOSE 161* 191* 184* 204* 191* 336* 345* 212* 161*  BUN 59* 65* 84* 104* 126* 133* 140* 148* 145*  CREATININE 4.71* 5.01* 5.56* 5.71* 5.59* 5.37* 5.27* 4.36* 4.02*  CALCIUM 7.3* 7.5* 7.3* 7.3* 7.0* 6.8* 6.9* 7.1* 7.4*  MG  --  2.1 2.3 2.4 2.7*  --   --   --   --   PHOS 3.9  --   --   --   --   --  8.0*  --   --    Liver Function Tests:  Recent Labs Lab 06/04/14 0327 06/05/14 0300 06/06/14 0230 06/07/14 0653  06/08/14 0205 06/10/14 0444  AST 28 31 36 33  --  21  ALT 21 27 37 36  --  32  ALKPHOS 79 81 83 79  --  72  BILITOT 0.8 0.4 0.4 0.7  --  0.6  PROT 5.8* 5.6* 5.7* 5.9*  --  5.5*  ALBUMIN 1.9* 1.8* 1.9* 1.9* 1.9* 2.2*    CBC:  Recent Labs Lab 06/03/14 2100 06/04/14 0327 06/05/14 0300 06/06/14 0230 06/07/14 0653 06/08/14 0205 06/09/14 0309  WBC 8.1 9.3 8.0 6.7 5.9 4.4 7.6  NEUTROABS 6.3 7.5 6.9 5.9 5.0  --   --   HGB 10.1* 10.2* 10.0* 10.1* 10.9* 9.6* 10.8*  HCT 29.9* 30.7* 29.1* 29.3* 31.1* 27.7* 30.7*  MCV 86.2 86.7 85.1 84.2 83.8 82.4 83.4  PLT 174 191 204 239 PLATELET CLUMPS NOTED ON SMEAR, COUNT APPEARS ADEQUATE 259 313   CBG:  Recent Labs Lab 06/09/14 1246 06/09/14 1650 06/09/14 1928 06/10/14 0610 06/10/14 1130  GLUCAP 195* 209* 208* 159* 145*    Recent Results (from the  past 240 hour(s))  Respiratory virus panel (routine influenza)     Status: None   Collection Time: 06/01/14  9:01 PM  Result Value Ref Range Status   Source - RVPAN NASAL SWAB  Corrected   Respiratory Syncytial Virus A Negative Negative Final   Respiratory Syncytial Virus B Negative Negative Final   Influenza A Negative Negative Final   Influenza B Negative Negative Final   Parainfluenza 1 Negative Negative Final   Parainfluenza 2 Negative Negative Final   Parainfluenza 3 Negative Negative Final   Metapneumovirus Negative Negative Final   Rhinovirus Negative Negative Final   Adenovirus Negative Negative Final    Comment: (NOTE) Performed At: [facility] [address] [place], [place] [id number] Lindon Romp MD VS:[id number]   Culture, blood (routine x 2)     Status: None   Collection Time: 06/01/14 10:33 PM  Result Value Ref Range Status   Specimen Description BLOOD RIGHT HAND  Final   Special Requests BOTTLES DRAWN AEROBIC ONLY 5CC  Final   Culture   Final    NO GROWTH 5 DAYS Performed at [facility]    Report Status 06/08/2014 FINAL  Final  Culture,  blood (routine x 2)     Status: None   Collection Time: 06/01/14 10:45 PM  Result Value Ref Range Status   Specimen Description BLOOD LEFT HAND  Final   Special Requests BOTTLES DRAWN AEROBIC ONLY 4CC  Final   Culture   Final    NO GROWTH 5 DAYS Performed at [facility]    Report Status 06/08/2014 FINAL  Final    Studies:  Recent x-ray studies have been reviewed in detail by the Attending Physician  Scheduled Meds:  Scheduled Meds: . antiseptic oral rinse  7 mL Mouth Rinse BID  . clopidogrel  75 mg Oral Daily  . doxycycline  100 mg Oral Q12H  . heparin  5,000 Units Subcutaneous 3 times per day  . hydrALAZINE  5 mg Oral BID  . insulin aspart  0-20 Units Subcutaneous TID WC  . insulin glargine  18 Units Subcutaneous Daily  . isosorbide mononitrate  15 mg Oral Daily  . methylPREDNISolone (SOLU-MEDROL) injection  60 mg Intravenous Q24H  . pregabalin  75 mg Oral Daily  . psyllium  1 packet Oral QHS  . sertraline  50 mg Oral QHS  . sodium chloride  3 mL Intravenous Q12H    Time spent on care of this patient: 35 mins  Phillips Climes , MD [facility] [phone]  Contact MD directly via text page:      [email or web address]      password [email or web address]  06/10/2014, 2:41 PM   LOS: 10 days

## 2014-06-10 NOTE — H&P (Signed)
Physical Medicine and Rehabilitation Admission H&P    Chief complaint:Weakness  HPI: Darrell Richardson is a 74 y.o. right handed male with history of CVA with residual left hemiparesis, diabetes mellitus peripheral neuropathy, gout, diastolic congestive heart failure, chronic renal insufficiency 2.44. Lives with his wife and used a cane prior to admission. Presented to [facility] 05/31/2014 with increasing nonspecific chest pain and shortness of breath and transferred to [facility] for further medical evaluation. Complaints of increased shortness of breath and lower extremity edema. He was found to have acute respiratory failure secondary to acute CHF exacerbation placed on BiPAP. Troponin was negative. BNP 7363 Patient placed on Lasix. Echocardiogram with ejection fraction of 45% with akinesis of the inferior lateral and inferior myocardium. Progressive increase in creatinine function from baseline 2.4-5.37 Renal service is consulted. Renal ultrasound showed no hydronephrosis and latest creatinine 4.02. Complaints of joint pain low-grade fever with ESR 135 as well as uric acid level 8.3. Patient had been placed on Solu-Medrol with workup concerning for PMR versus RA. It was felt prednisone was inducing increased renal function. TEE completed 06/07/2014 showing no vegetation moderate sized PFO with left-to-right shunting at rest. Full workups remain underway with ANCA studies normal.. Palliative care has been consult to establish goals of care. MRI lumbar spine revealed some bulging disc and some spinal stenosis. Also concern over bone marrow abnormalities with neurology services to follow-up with bone scan completed for clarity that showed no evidence of osseous metastatic disease 06/10/2014. Subcutaneous heparin for DVT prophylaxis. Physical therapy evaluation completed 06/05/2014 with recommendations of physical medicine rehabilitation consult.Patient was admitted for a  comprehensive rehab program.  ROS Review of Systems  HENT: Positive for hearing loss.  Respiratory: Positive for shortness of breath.  Cardiovascular: Positive for leg swelling.  Gastrointestinal: Positive for constipation.  Musculoskeletal: Positive for myalgias and joint pain.  Psychiatric/Behavioral: Positive for depression.  All other systems reviewed and are negative   Past Medical History  Diagnosis Date  . Hearing loss   . Gout   . Diabetes   . Fatigue   . Hypertension   . Stroke     X 2  . Heart failure   . Muscle pain   . Hammertoe    Past Surgical History  Procedure Laterality Date  . Stomach surgery    . Foot surgery Right   . Tee without cardioversion N/A 06/07/2014    Procedure: TRANSESOPHAGEAL ECHOCARDIOGRAM (TEE);  Surgeon: Jolaine Artist, MD;  Location: [facility] ENDOSCOPY;  Service: Cardiovascular;  Laterality: N/A;   Family History  Problem Relation Age of Onset  . Dementia Mother    Social History:  reports that he has quit smoking. He does not have any smokeless tobacco history on file. He reports that he drinks alcohol. He reports that he does not use illicit drugs. Allergies:  Allergies  Allergen Reactions  . Cortisone Anaphylaxis and Other (See Comments)    Cold, elevated BP, shaking all over and fever  Wife says tolerates hydrocortisone cream at home  . Ambien [Zolpidem Tartrate]     Did not tolerate well or remembered anything from previous night  . Rocephin [Ceftriaxone] Hives  . Levaquin [Levofloxacin In D5w] Rash   Medications Prior to Admission  Medication Sig Dispense Refill  . albuterol (PROAIR HFA) 108 (90 BASE) MCG/ACT inhaler Inhale 2 puffs into the lungs every 4 (four) hours as needed for wheezing or shortness of breath. Inhale 1-2 puffs four times daily as needed    .  allopurinol (ZYLOPRIM) 100 MG tablet Take 100 mg by mouth 2 (two) times daily.    Marland Kitchen amLODipine (NORVASC) 5 MG tablet Take 5 mg by mouth 2 (two) times daily.    Marland Kitchen  atorvastatin (LIPITOR) 20 MG tablet Take 40 mg by mouth daily.     . Cholecalciferol (VITAMIN D3) 1000 UNITS CAPS Take 1,000 Units by mouth daily.    . clopidogrel (PLAVIX) 75 MG tablet Take 75 mg by mouth daily with breakfast.    . docusate sodium (COLACE) 100 MG capsule Take 100 mg by mouth at bedtime.    . furosemide (LASIX) 80 MG tablet Take 80 mg by mouth daily.    Marland Kitchen HYDROcodone-acetaminophen (VICODIN ES) 7.5-750 MG per tablet Take 1 tablet by mouth every 6 (six) hours as needed for pain. Take 1/2 - 1 four times a day as needed    . losartan (COZAAR) 50 MG tablet Take 50 mg by mouth daily.    . metoprolol succinate (TOPROL-XL) 25 MG 24 hr tablet Take 25 mg by mouth 2 (two) times daily.     . Multiple Vitamin (MULTIVITAMIN) tablet Take 1 tablet by mouth daily.    . pregabalin (LYRICA) 75 MG capsule Take 75 mg by mouth 2 (two) times daily.    . Psyllium (METAMUCIL PO) Take by mouth at bedtime.    . sertraline (ZOLOFT) 50 MG tablet Take 50 mg by mouth at bedtime.    . sitaGLIPtin (JANUVIA) 50 MG tablet Take 50 mg by mouth daily.    . vitamin E 200 UNIT capsule Take 200 Units by mouth daily.      Home: Home Living Family/patient expects to be discharged to:: Private residence Living Arrangements: Spouse/significant other Available Help at Discharge: Family Type of Home: House Home Access: [place]: One [place]: Environmental consultant - 2 wheels, Environmental consultant - 4 wheels, [place] - quad, [place] - single point, Civil engineer, contracting, Grab bars - tub/shower   Functional History: Prior Function Level of Independence: Independent Comments: occassional use of cane/walker if bad day  Functional Status:  Mobility: Bed Mobility Overal bed mobility: Needs Assistance Bed Mobility: Supine to Sit Supine to sit: Mod assist, HOB elevated General bed mobility comments: Pt able to move legs off EOB. Assist to elevate trunk and bring hips to EOB. Transfers Overall transfer level: Needs  assistance Equipment used: Ambulation equipment used ([employer] and [employer]) Transfers: Sit to/from Stand, [employer] Sit to Stand: +2 physical assistance, Total assist, From elevated surface Stand pivot transfers: +2 physical assistance, Total assist, From elevated surface General transfer comment: Attempted x 5 to stand with Bariatric Stedy and pt able to clear hips from bed 8-10 inches but unable to achieve full stand. Used Clarise Cruz Plus to transfer from bed to chair and with extra support at hips while pivoting due to hips/knees flexing. Ambulation/Gait General Gait Details: unable to perform    ADL: ADL Overall ADL's : Needs assistance/impaired Eating/Feeding: Independent, Sitting Grooming: Set up, Sitting Upper Body Bathing: Sitting, Moderate assistance Upper Body Bathing Details (indicate cue type and reason): pt with difficulty maintaining sitting balance during UB ADL Lower Body Bathing: Total assistance, Bed level Upper Body Dressing : Moderate assistance, Sitting Upper Body Dressing Details (indicate cue type and reason): pt with difficulty maintaining sitting balance with UB dressing Lower Body Dressing: Total assistance, Bed level  Cognition: Cognition Overall Cognitive Status: Within Functional Limits for tasks assessed Orientation Level: Oriented X4 Cognition Arousal/Alertness: Awake/alert Behavior During Therapy: [facility]  for tasks assessed/performed Overall Cognitive Status: Within Functional Limits for tasks assessed  Physical Exam: Blood pressure 152/63, pulse 69, temperature 97.4 F (36.3 C), temperature source Oral, resp. rate 18, height 5' 8"  (1.727 m), weight 106.1 kg (233 lb 14.5 oz), SpO2 98 %. Physical Exam Constitutional: He is oriented to person, place, and time. obese HENT: conjunctiva appear slightly inflamed. No drainage Head: Normocephalic.  Eyes: EOM are normal.  Neck: Normal range of motion. Neck supple. No thyromegaly present.   Cardiovascular:  Cardiac rate controlled without murmur Respiratory:  Good inspiratory effort clear to auscultation  GI: Soft. Bowel sounds are normal. He exhibits no distension.  Musculoskeletal:  Weak grip due to pain in fingers, swelling. Mild swelling at knees and bilateral ankles with associated mild erythema and warmth. Has pain with AROM and PROm of both knees and ankles. Right ankle remains slightly more swollen than the left. No erythema. Only mild warmth. Pain worse with palpation along the medial malleoli and with inversion of the foot. Seems to have cord of taut muscle along the rectus femoris.  Neurological: He is alert and oriented to person, place, and time.  Moves all 4's. Strength and ROM limited due to pain. UE's 3+ to 4 delt,bicep,tricep, grip 3-. LE: 2+ to 3- HF, KE and 3/5 Right ankle 3+ left ankle. No sensory changes  Skin: Skin is warm and dry.  Psychiatric: He has a normal mood and affect. His behavior is normal. Pleasant and cooperative    Results for orders placed or performed during the hospital encounter of 05/31/14 (from the past 48 hour(s))  Glucose, capillary     Status: Abnormal   Collection Time: 06/08/14 11:09 AM  Result Value Ref Range   Glucose-Capillary 282 (H) 70 - 99 mg/dL   Comment 1 Notify RN   Glucose, capillary     Status: Abnormal   Collection Time: 06/08/14  4:31 PM  Result Value Ref Range   Glucose-Capillary 422 (H) 70 - 99 mg/dL   Comment 1 Notify RN   Glucose, capillary     Status: Abnormal   Collection Time: 06/08/14  8:25 PM  Result Value Ref Range   Glucose-Capillary 373 (H) 70 - 99 mg/dL   Comment 1 Capillary Specimen   Glucose, capillary     Status: Abnormal   Collection Time: 06/09/14 12:27 AM  Result Value Ref Range   Glucose-Capillary 271 (H) 70 - 99 mg/dL   Comment 1 Capillary Specimen   CBC     Status: Abnormal   Collection Time: 06/09/14  3:09 AM  Result Value Ref Range   WBC 7.6 4.0 - 10.5 K/uL   RBC 3.68 (L) 4.22 -  5.81 MIL/uL   Hemoglobin 10.8 (L) 13.0 - 17.0 g/dL   HCT 30.7 (L) 39.0 - 52.0 %   MCV 83.4 78.0 - 100.0 fL   MCH 29.3 26.0 - 34.0 pg   MCHC 35.2 30.0 - 36.0 g/dL   RDW 12.5 11.5 - 15.5 %   Platelets 313 150 - 400 K/uL  Basic metabolic panel     Status: Abnormal   Collection Time: 06/09/14  3:09 AM  Result Value Ref Range   Sodium 132 (L) 135 - 145 mmol/L   Potassium 4.6 3.5 - 5.1 mmol/L   Chloride 96 96 - 112 mmol/L   CO2 21 19 - 32 mmol/L   Glucose, Bld 212 (H) 70 - 99 mg/dL   BUN 148 (H) 6 - 23 mg/dL   Creatinine, Ser  4.36 (H) 0.50 - 1.35 mg/dL   Calcium 7.1 (L) 8.4 - 10.5 mg/dL   GFR calc non Af Amer 12 (L) >90 mL/min   GFR calc Af Amer 14 (L) >90 mL/min    Comment: (NOTE) The eGFR has been calculated using the CKD EPI equation. This calculation has not been validated in all clinical situations. eGFR's persistently <90 mL/min signify possible Chronic Kidney Disease.    Anion gap 15 5 - 15  Glucose, capillary     Status: Abnormal   Collection Time: 06/09/14  5:23 AM  Result Value Ref Range   Glucose-Capillary 189 (H) 70 - 99 mg/dL  Glucose, capillary     Status: Abnormal   Collection Time: 06/09/14  8:16 AM  Result Value Ref Range   Glucose-Capillary 148 (H) 70 - 99 mg/dL   Comment 1 Capillary Specimen   Glucose, capillary     Status: Abnormal   Collection Time: 06/09/14 12:46 PM  Result Value Ref Range   Glucose-Capillary 195 (H) 70 - 99 mg/dL  Glucose, capillary     Status: Abnormal   Collection Time: 06/09/14  4:50 PM  Result Value Ref Range   Glucose-Capillary 209 (H) 70 - 99 mg/dL  Glucose, capillary     Status: Abnormal   Collection Time: 06/09/14  7:28 PM  Result Value Ref Range   Glucose-Capillary 208 (H) 70 - 99 mg/dL  Sedimentation rate     Status: Abnormal   Collection Time: 06/10/14  4:44 AM  Result Value Ref Range   Sed Rate 28 (H) 0 - 16 mm/hr  Hepatic function panel     Status: Abnormal   Collection Time: 06/10/14  4:44 AM  Result Value Ref  Range   Total Protein 5.5 (L) 6.0 - 8.3 g/dL   Albumin 2.2 (L) 3.5 - 5.2 g/dL   AST 21 0 - 37 U/L   ALT 32 0 - 53 U/L   Alkaline Phosphatase 72 39 - 117 U/L   Total Bilirubin 0.6 0.3 - 1.2 mg/dL   Bilirubin, Direct 0.1 0.0 - 0.5 mg/dL   Indirect Bilirubin 0.5 0.3 - 0.9 mg/dL  Basic metabolic panel     Status: Abnormal   Collection Time: 06/10/14  4:44 AM  Result Value Ref Range   Sodium 134 (L) 135 - 145 mmol/L   Potassium 4.0 3.5 - 5.1 mmol/L   Chloride 97 96 - 112 mmol/L   CO2 25 19 - 32 mmol/L   Glucose, Bld 161 (H) 70 - 99 mg/dL   BUN 145 (H) 6 - 23 mg/dL   Creatinine, Ser 4.02 (H) 0.50 - 1.35 mg/dL   Calcium 7.4 (L) 8.4 - 10.5 mg/dL   GFR calc non Af Amer 13 (L) >90 mL/min   GFR calc Af Amer 16 (L) >90 mL/min    Comment: (NOTE) The eGFR has been calculated using the CKD EPI equation. This calculation has not been validated in all clinical situations. eGFR's persistently <90 mL/min signify possible Chronic Kidney Disease.    Anion gap 12 5 - 15  Cortisol     Status: None   Collection Time: 06/10/14  5:00 AM  Result Value Ref Range   Cortisol, Plasma 18.9 ug/dL    Comment: (NOTE) AM:  4.3 - 22.4 ug/dL PM:  3.1 - 16.7 ug/dL Performed at [facility]   Glucose, capillary     Status: Abnormal   Collection Time: 06/10/14  6:10 AM  Result Value Ref Range   Glucose-Capillary  159 (H) 70 - 99 mg/dL   Mr Lumbar Spine Wo Contrast  06/09/2014   CLINICAL DATA:  Focal lower extremity weakness. History of generalized weakness. No acute back pain, bowel or bladder symptoms. Initial encounter.  EXAM: MRI LUMBAR SPINE WITHOUT CONTRAST  TECHNIQUE: Multiplanar, multisequence MR imaging of the lumbar spine was performed. No intravenous contrast was administered.  COMPARISON:  Abdominal pelvic CT 08/13/2013.  FINDINGS: The lumbar spine examination is mildly motion degraded, especially on the axial images. CT demonstrates 5 lumbar type vertebral bodies. The alignment is stable and  near anatomic. The marrow signal within the spine is mildly heterogeneous without focally suspicious lesion. There are foci of decreased T1 and T2 signal in the left iliac bone without clear corresponding finding on prior CT.  The conus medullaris extends to the T12-L1 level and appears normal. No focal paraspinal abnormalities are identified. There is subcutaneous edema in the back.  No significant disc space findings are seen from T10-11 through L2-3. There are paraspinal osteophytes anteriorly in the lower thoracic spine.  L3-4: Mild annular disc bulging. There is facet and ligamentous hypertrophy with bilateral facet joint effusions. These factors contribute to mild spinal stenosis with mild narrowing of both lateral recesses.  L4-5: Mild annular disc bulging. There is moderate facet and ligamentous hypertrophy with bilateral facet joint effusions. These factors contribute to moderate spinal stenosis with asymmetric narrowing of the right lateral recess and right foramen. There is mild subchondral edema within the facet joints without bone destruction or periarticular fluid collection.  L5-S1: Disc height and hydration are maintained. Mild bilateral facet hypertrophy. No spinal stenosis or nerve root encroachment.  IMPRESSION: 1. Moderate facet and ligamentous hypertrophy with facet joint effusions and subchondral edema bilaterally at L3-4 and L4-5. 2. Associated annular disc bulging at both levels, contributing to mild spinal stenosis at L3-4 and moderate spinal stenosis at L4-5. 3. Nonspecific marrow lesions within the left iliac bone. Early metastatic disease cannot be completely excluded. No suspicious lesions demonstrated within the lumbar spine.   Electronically Signed   By: Richardean Sale M.D.   On: 06/09/2014 17:06   Mr Sacrum/si Joints Wo Contrast  06/09/2014   CLINICAL DATA:  Focal lower extremity weakness. History of generalized weakness. No acute back pain, bowel or bladder symptoms. Initial  encounter.  EXAM: MR SACRUM WITHOUT CONTRAST  TECHNIQUE: Multiplanar, multisequence MR imaging was performed. No intravenous contrast was administered.  COMPARISON:  Pelvic CT 08/13/2013.  FINDINGS: Lumbar spine findings dictated separately.  No evidence of sacral fracture or destruction. The sacroiliac joints are intact without erosive change. There is no presacral fluid collection. There is no sacral nerve foraminal compromise or nerve root encroachment.  There is mildly heterogeneous marrow signal within the sacrum and iliac bones on the T1 weighted images. No suspicious lesions demonstrated on T2 or inversion recovery imaging.  Foley catheter is in place. The bladder is decompressed and demonstrates mild wall thickening. There is nodularity throughout the peripheral and central portions of the prostate gland. Asymmetric fat in the right inguinal canal is incompletely visualized, although grossly stable. There is nonspecific subcutaneous edema dependently in the buttocks as well as mild perirectal edema.  IMPRESSION: 1. Negative MRI of the sacrum and sacroiliac joints. 2. No evidence of sacral nerve root encroachment. 3. Lumbar spine findings dictated separately.   Electronically Signed   By: Richardean Sale M.D.   On: 06/09/2014 16:54       Medical Problem List and Plan: 1. Functional deficits  secondary to debilitation after CHF exacerbation/respiratory failure/question polyarticular gout. 2.  DVT Prophylaxis/Anticoagulation: Subcutaneous heparin. Monitor platelet counts and any signs of bleeding 3. Pain Management: Lyrica 75 mg daily, Hydrocodone as needed. Monitor with increased mobility 4. Mood/depression. Zoloft 50 mg daily. Provide emotional support 5. Neuropsych: This patient is capable of making decisions on his own behalf. 6. Skin/Wound Care: Routine skin checks 7. Fluids/Electrolytes/Nutrition: Strict I&O with follow-up chemistries 8. Chronic renal insufficiency with baseline creatinine  2.44 since increased to 5.37 and stabilizing. It was felt elevation possibly due to prednisone therapy as well as in the setting of decompensated CHF and hypotension. Renal service to follow-up 9. 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 No significant improvement with steroids. Negative TEE. Negative anti-histone DNA. Await further studies. Continue Solu-Medrol for now and plan Taper as we await further studies 10. Acute on chronic diastolic congestive heart failure. Continue to monitor with any signs of fluid overload. Lasix has since been discontinued 11. Diabetes mellitus with peripheral neuropathy. Monitor closely while on Solu-Medrol. Hemoglobin A1c 7.5. Lantus insulin 18 units daily. Check blood sugars before meals and at bedtime 12. History of CVA with left hemiparesis. Continue Plavix   Post Admission Physician Evaluation: 1. Functional deficits secondary  to Debility after respiratory failure. Mobility issues compounded by polyarticular gout. 2. Patient is admitted to receive collaborative, interdisciplinary care between the physiatrist, rehab nursing staff, and therapy team. 3. Patient's level of medical complexity and substantial therapy needs in context of that medical necessity cannot be provided at a lesser intensity of care such as a SNF. 4. Patient has experienced substantial functional loss from his/her baseline which was documented above under the "Functional History" and "Functional Status" headings.  Judging by the patient's diagnosis, physical exam, and functional history, the patient has potential for functional progress which will result in measurable gains while on inpatient rehab.  These gains will be of substantial and practical use upon discharge  in facilitating mobility and self-care at the household level. 5. Physiatrist will provide 24 hour management of medical needs as well as oversight of the therapy plan/treatment and provide guidance as appropriate regarding the  interaction of the two. 6. 24 hour rehab nursing will assist with bladder management, bowel management, safety, skin/wound care, disease management, medication administration, pain management and patient education  and help integrate therapy concepts, techniques,education, etc. 7. PT will assess and treat for/with: Lower extremity strength, range of motion, stamina, balance, functional mobility, safety, adaptive techniques and equipment, pain mgt, NMR, acitivity tolerance, care giver education.   Goals are: supervision to min assist. 8. OT will assess and treat for/with: ADL's, functional mobility, safety, upper extremity strength, adaptive techniques and equipment, NMR, pain mgt, activity tolerance, coping skills, family ed, ego support.   Goals are: supervision to min assist. Therapy may proceed with showering this patient. 9. SLP will assess and treat for/with: n/a.  Goals are: n/a. 10. Case Management and Social Worker will assess and treat for psychological issues and discharge planning. 11. Team conference will be held weekly to assess progress toward goals and to determine barriers to discharge. 12. Patient will receive at least 3 hours of therapy per day at least 5 days per week. 13. ELOS: 15-20 days       14. Prognosis:  good     Meredith Staggers, MD, [place] [facility] 06/11/2014   06/10/2014

## 2014-06-11 ENCOUNTER — Inpatient Hospital Stay (HOSPITAL_COMMUNITY): Payer: Medicare Other

## 2014-06-11 ENCOUNTER — Encounter (HOSPITAL_COMMUNITY): Payer: Self-pay

## 2014-06-11 ENCOUNTER — Inpatient Hospital Stay (HOSPITAL_COMMUNITY)
Admission: RE | Admit: 2014-06-11 | Discharge: 2014-06-23 | DRG: 947 | Disposition: A | Discharge status: Home Health | Payer: Medicare Other | Source: Intra-hospital | Attending: Physical Medicine & Rehabilitation | Admitting: Physical Medicine & Rehabilitation

## 2014-06-11 DIAGNOSIS — I429 Cardiomyopathy, unspecified: Secondary | ICD-10-CM | POA: Diagnosis present

## 2014-06-11 DIAGNOSIS — N179 Acute kidney failure, unspecified: Secondary | ICD-10-CM | POA: Diagnosis present

## 2014-06-11 DIAGNOSIS — I509 Heart failure, unspecified: Secondary | ICD-10-CM

## 2014-06-11 DIAGNOSIS — H9190 Unspecified hearing loss, unspecified ear: Secondary | ICD-10-CM | POA: Diagnosis present

## 2014-06-11 DIAGNOSIS — E43 Unspecified severe protein-calorie malnutrition: Secondary | ICD-10-CM | POA: Diagnosis present

## 2014-06-11 DIAGNOSIS — Z7902 Long term (current) use of antithrombotics/antiplatelets: Secondary | ICD-10-CM

## 2014-06-11 DIAGNOSIS — I129 Hypertensive chronic kidney disease with stage 1 through stage 4 chronic kidney disease, or unspecified chronic kidney disease: Secondary | ICD-10-CM | POA: Diagnosis present

## 2014-06-11 DIAGNOSIS — E1142 Type 2 diabetes mellitus with diabetic polyneuropathy: Secondary | ICD-10-CM | POA: Diagnosis present

## 2014-06-11 DIAGNOSIS — I69354 Hemiplegia and hemiparesis following cerebral infarction affecting left non-dominant side: Secondary | ICD-10-CM

## 2014-06-11 DIAGNOSIS — I5042 Chronic combined systolic (congestive) and diastolic (congestive) heart failure: Secondary | ICD-10-CM | POA: Diagnosis present

## 2014-06-11 DIAGNOSIS — N183 Chronic kidney disease, stage 3 (moderate): Secondary | ICD-10-CM

## 2014-06-11 DIAGNOSIS — N184 Chronic kidney disease, stage 4 (severe): Secondary | ICD-10-CM | POA: Diagnosis present

## 2014-06-11 DIAGNOSIS — Z79899 Other long term (current) drug therapy: Secondary | ICD-10-CM

## 2014-06-11 DIAGNOSIS — R972 Elevated prostate specific antigen [PSA]: Secondary | ICD-10-CM | POA: Diagnosis present

## 2014-06-11 DIAGNOSIS — E871 Hypo-osmolality and hyponatremia: Secondary | ICD-10-CM | POA: Diagnosis present

## 2014-06-11 DIAGNOSIS — M353 Polymyalgia rheumatica: Secondary | ICD-10-CM | POA: Diagnosis present

## 2014-06-11 DIAGNOSIS — I2510 Atherosclerotic heart disease of native coronary artery without angina pectoris: Secondary | ICD-10-CM | POA: Diagnosis not present

## 2014-06-11 DIAGNOSIS — B9620 Unspecified Escherichia coli [E. coli] as the cause of diseases classified elsewhere: Secondary | ICD-10-CM | POA: Diagnosis present

## 2014-06-11 DIAGNOSIS — E1165 Type 2 diabetes mellitus with hyperglycemia: Secondary | ICD-10-CM | POA: Diagnosis present

## 2014-06-11 DIAGNOSIS — T380X5A Adverse effect of glucocorticoids and synthetic analogues, initial encounter: Secondary | ICD-10-CM | POA: Diagnosis present

## 2014-06-11 DIAGNOSIS — R5381 Other malaise: Secondary | ICD-10-CM | POA: Diagnosis present

## 2014-06-11 DIAGNOSIS — M2550 Pain in unspecified joint: Secondary | ICD-10-CM | POA: Diagnosis present

## 2014-06-11 DIAGNOSIS — I10 Essential (primary) hypertension: Secondary | ICD-10-CM | POA: Diagnosis present

## 2014-06-11 DIAGNOSIS — M109 Gout, unspecified: Secondary | ICD-10-CM | POA: Diagnosis present

## 2014-06-11 DIAGNOSIS — M1000 Idiopathic gout, unspecified site: Secondary | ICD-10-CM | POA: Diagnosis present

## 2014-06-11 DIAGNOSIS — N390 Urinary tract infection, site not specified: Secondary | ICD-10-CM | POA: Diagnosis present

## 2014-06-11 DIAGNOSIS — E875 Hyperkalemia: Secondary | ICD-10-CM | POA: Diagnosis present

## 2014-06-11 DIAGNOSIS — R700 Elevated erythrocyte sedimentation rate: Secondary | ICD-10-CM | POA: Diagnosis present

## 2014-06-11 DIAGNOSIS — J9600 Acute respiratory failure, unspecified whether with hypoxia or hypercapnia: Secondary | ICD-10-CM | POA: Diagnosis not present

## 2014-06-11 DIAGNOSIS — F4321 Adjustment disorder with depressed mood: Secondary | ICD-10-CM | POA: Diagnosis present

## 2014-06-11 DIAGNOSIS — N2581 Secondary hyperparathyroidism of renal origin: Secondary | ICD-10-CM | POA: Diagnosis present

## 2014-06-11 DIAGNOSIS — I5033 Acute on chronic diastolic (congestive) heart failure: Secondary | ICD-10-CM | POA: Diagnosis present

## 2014-06-11 DIAGNOSIS — Z87891 Personal history of nicotine dependence: Secondary | ICD-10-CM | POA: Diagnosis not present

## 2014-06-11 DIAGNOSIS — M791 Myalgia, unspecified site: Secondary | ICD-10-CM | POA: Diagnosis present

## 2014-06-11 DIAGNOSIS — M069 Rheumatoid arthritis, unspecified: Secondary | ICD-10-CM | POA: Diagnosis present

## 2014-06-11 DIAGNOSIS — I159 Secondary hypertension, unspecified: Secondary | ICD-10-CM | POA: Diagnosis not present

## 2014-06-11 DIAGNOSIS — IMO0002 Reserved for concepts with insufficient information to code with codable children: Secondary | ICD-10-CM | POA: Diagnosis present

## 2014-06-11 LAB — GLUCOSE, CAPILLARY
GLUCOSE-CAPILLARY: 207 mg/dL — AB (ref 70–99)
GLUCOSE-CAPILLARY: 242 mg/dL — AB (ref 70–99)
Glucose-Capillary: 125 mg/dL — ABNORMAL HIGH (ref 70–99)
Glucose-Capillary: 163 mg/dL — ABNORMAL HIGH (ref 70–99)

## 2014-06-11 LAB — RENAL FUNCTION PANEL
Albumin: 2.3 g/dL — ABNORMAL LOW (ref 3.5–5.2)
Anion gap: 8 (ref 5–15)
BUN: 138 mg/dL — ABNORMAL HIGH (ref 6–23)
CO2: 25 mmol/L (ref 19–32)
CREATININE: 3.36 mg/dL — AB (ref 0.50–1.35)
Calcium: 7.5 mg/dL — ABNORMAL LOW (ref 8.4–10.5)
Chloride: 101 mmol/L (ref 96–112)
GFR calc Af Amer: 19 mL/min — ABNORMAL LOW (ref >90)
GFR, EST NON AFRICAN AMERICAN: 17 mL/min — AB (ref >90)
Glucose, Bld: 145 mg/dL — ABNORMAL HIGH (ref 70–99)
Phosphorus: 6.8 mg/dL — ABNORMAL HIGH (ref 2.3–4.6)
Potassium: 4.7 mmol/L (ref 3.5–5.1)
SODIUM: 134 mmol/L — AB (ref 135–145)

## 2014-06-11 LAB — CBC
HCT: 34 % — ABNORMAL LOW (ref 39.0–52.0)
Hemoglobin: 11.8 g/dL — ABNORMAL LOW (ref 13.0–17.0)
MCH: 29.2 pg (ref 26.0–34.0)
MCHC: 34.7 g/dL (ref 30.0–36.0)
MCV: 84.2 fL (ref 78.0–100.0)
PLATELETS: 330 10*3/uL (ref 150–400)
RBC: 4.04 MIL/uL — AB (ref 4.22–5.81)
RDW: 12.6 % (ref 11.5–15.5)
WBC: 9.1 10*3/uL (ref 4.0–10.5)

## 2014-06-11 LAB — CREATININE, SERUM
Creatinine, Ser: 2.98 mg/dL — ABNORMAL HIGH (ref 0.50–1.35)
GFR calc Af Amer: 22 mL/min — ABNORMAL LOW (ref >90)
GFR calc non Af Amer: 19 mL/min — ABNORMAL LOW (ref >90)

## 2014-06-11 MED ORDER — SERTRALINE HCL 50 MG PO TABS
50.0000 mg | ORAL_TABLET | Freq: Every day | ORAL | Status: DC
  Administered 2014-06-11 – 2014-06-22 (×12): 50 mg via ORAL
  Filled 2014-06-11 (×13): qty 1

## 2014-06-11 MED ORDER — ALLOPURINOL 100 MG PO TABS
100.0000 mg | ORAL_TABLET | Freq: Every day | ORAL | Status: DC
  Administered 2014-06-11: 100 mg via ORAL
  Filled 2014-06-11: qty 1

## 2014-06-11 MED ORDER — ISOSORBIDE MONONITRATE ER 30 MG PO TB24: 15.0000 mg | ORAL_TABLET | Freq: Every day | ORAL | Status: DC

## 2014-06-11 MED ORDER — INSULIN GLARGINE 100 UNIT/ML ~~LOC~~ SOLN: 18.0000 [IU] | Freq: Every day | SUBCUTANEOUS | Status: DC

## 2014-06-11 MED ORDER — METOPROLOL SUCCINATE ER 25 MG PO TB24
25.0000 mg | ORAL_TABLET | Freq: Two times a day (BID) | ORAL | Status: DC
  Administered 2014-06-11: 25 mg via ORAL
  Filled 2014-06-11 (×3): qty 1

## 2014-06-11 MED ORDER — HEPARIN SODIUM (PORCINE) 5000 UNIT/ML IJ SOLN
5000.0000 [IU] | Freq: Three times a day (TID) | INTRAMUSCULAR | Status: DC
  Administered 2014-06-11 – 2014-06-23 (×35): 5000 [IU] via SUBCUTANEOUS
  Filled 2014-06-11 (×39): qty 1

## 2014-06-11 MED ORDER — COLCHICINE 0.6 MG PO TABS
0.6000 mg | ORAL_TABLET | Freq: Once | ORAL | Status: AC
  Administered 2014-06-11: 0.6 mg via ORAL
  Filled 2014-06-11: qty 1

## 2014-06-11 MED ORDER — PREDNISONE 10 MG PO TABS: 30.0000 mg | ORAL_TABLET | Freq: Every day | ORAL | Status: AC

## 2014-06-11 MED ORDER — PREDNISONE 10 MG PO TABS: 10.0000 mg | ORAL_TABLET | Freq: Every day | ORAL | Status: AC

## 2014-06-11 MED ORDER — PREDNISONE 20 MG PO TABS: 20.0000 mg | ORAL_TABLET | Freq: Every day | ORAL | Status: AC

## 2014-06-11 MED ORDER — HYDROCORTISONE 1 % EX CREA
TOPICAL_CREAM | Freq: Four times a day (QID) | CUTANEOUS | Status: DC | PRN
  Filled 2014-06-11: qty 28

## 2014-06-11 MED ORDER — IPRATROPIUM-ALBUTEROL 0.5-2.5 (3) MG/3ML IN SOLN: 3.0000 mL | Freq: Four times a day (QID) | RESPIRATORY_TRACT | Status: DC | PRN

## 2014-06-11 MED ORDER — METHYLPREDNISOLONE SODIUM SUCC 125 MG IJ SOLR
125.0000 mg | INTRAMUSCULAR | Status: DC
  Filled 2014-06-11: qty 2

## 2014-06-11 MED ORDER — CARVEDILOL 3.125 MG PO TABS: 3.1250 mg | ORAL_TABLET | Freq: Two times a day (BID) | ORAL | Status: DC

## 2014-06-11 MED ORDER — DIPHENHYDRAMINE HCL 25 MG PO CAPS: 25.0000 mg | ORAL_CAPSULE | Freq: Four times a day (QID) | ORAL | Status: DC | PRN

## 2014-06-11 MED ORDER — ONDANSETRON HCL 4 MG/2ML IJ SOLN
4.0000 mg | Freq: Four times a day (QID) | INTRAMUSCULAR | Status: DC | PRN
  Administered 2014-06-14: 4 mg via INTRAVENOUS
  Filled 2014-06-11: qty 2

## 2014-06-11 MED ORDER — ALLOPURINOL 100 MG PO TABS: 100.0000 mg | ORAL_TABLET | Freq: Two times a day (BID) | ORAL | Status: DC

## 2014-06-11 MED ORDER — PREDNISONE 20 MG PO TABS: 20.0000 mg | ORAL_TABLET | Freq: Every day | ORAL | Status: DC

## 2014-06-11 MED ORDER — ACETAMINOPHEN 650 MG RE SUPP
650.0000 mg | Freq: Four times a day (QID) | RECTAL | Status: DC | PRN
  Filled 2014-06-11: qty 1

## 2014-06-11 MED ORDER — ALLOPURINOL 100 MG PO TABS: 100.0000 mg | ORAL_TABLET | Freq: Every day | ORAL | Status: DC

## 2014-06-11 MED ORDER — SORBITOL 70 % SOLN
30.0000 mL | Freq: Every day | Status: DC | PRN
  Administered 2014-06-12 – 2014-06-18 (×2): 30 mL via ORAL
  Filled 2014-06-11 (×2): qty 30

## 2014-06-11 MED ORDER — ISOSORBIDE MONONITRATE ER 30 MG PO TB24: 30.0000 mg | ORAL_TABLET | Freq: Every day | ORAL | Status: DC

## 2014-06-11 MED ORDER — PREDNISONE 20 MG PO TABS: 30.0000 mg | ORAL_TABLET | Freq: Every day | ORAL | Status: DC

## 2014-06-11 MED ORDER — HYDROCODONE-ACETAMINOPHEN 5-325 MG PO TABS
1.0000 | ORAL_TABLET | ORAL | Status: DC | PRN
Start: 2014-06-11 — End: 2014-06-23

## 2014-06-11 MED ORDER — ACETAMINOPHEN 325 MG PO TABS: 650.0000 mg | ORAL_TABLET | Freq: Four times a day (QID) | ORAL | Status: DC | PRN

## 2014-06-11 MED ORDER — INSULIN GLARGINE 100 UNIT/ML ~~LOC~~ SOLN
18.0000 [IU] | Freq: Every day | SUBCUTANEOUS | Status: DC
  Administered 2014-06-12: 18 [IU] via SUBCUTANEOUS
  Filled 2014-06-11: qty 0.18

## 2014-06-11 MED ORDER — HEPARIN SODIUM (PORCINE) 5000 UNIT/ML IJ SOLN: 5000.0000 [IU] | Freq: Three times a day (TID) | INTRAMUSCULAR | Status: DC

## 2014-06-11 MED ORDER — HYDROCODONE-ACETAMINOPHEN 5-325 MG PO TABS
1.0000 | ORAL_TABLET | ORAL | Status: DC | PRN
  Administered 2014-06-12 – 2014-06-15 (×10): 1 via ORAL
  Administered 2014-06-15 – 2014-06-21 (×4): 2 via ORAL
  Administered 2014-06-21: 1 via ORAL
  Administered 2014-06-22 – 2014-06-23 (×3): 2 via ORAL
  Filled 2014-06-11 (×2): qty 2
  Filled 2014-06-11 (×3): qty 1
  Filled 2014-06-11 (×2): qty 2
  Filled 2014-06-11: qty 1
  Filled 2014-06-11: qty 2
  Filled 2014-06-11: qty 1
  Filled 2014-06-11 (×2): qty 2
  Filled 2014-06-11: qty 1
  Filled 2014-06-11: qty 2
  Filled 2014-06-11 (×2): qty 1
  Filled 2014-06-11: qty 2
  Filled 2014-06-11: qty 1
  Filled 2014-06-11: qty 2

## 2014-06-11 MED ORDER — PREDNISONE 20 MG PO TABS
40.0000 mg | ORAL_TABLET | Freq: Every day | ORAL | Status: DC
  Filled 2014-06-11: qty 2

## 2014-06-11 MED ORDER — PREGABALIN 50 MG PO CAPS
75.0000 mg | ORAL_CAPSULE | Freq: Every day | ORAL | Status: DC
  Administered 2014-06-12 – 2014-06-23 (×12): 75 mg via ORAL
  Filled 2014-06-11 (×24): qty 1

## 2014-06-11 MED ORDER — METHYLPREDNISOLONE SODIUM SUCC 125 MG IJ SOLR
125.0000 mg | INTRAMUSCULAR | Status: DC
  Administered 2014-06-11: 125 mg via INTRAVENOUS

## 2014-06-11 MED ORDER — PREDNISONE 20 MG PO TABS
40.0000 mg | ORAL_TABLET | Freq: Every day | ORAL | Status: AC
Start: 2014-06-12 — End: 2014-06-12

## 2014-06-11 MED ORDER — HYDRALAZINE HCL 25 MG PO TABS
12.5000 mg | ORAL_TABLET | Freq: Two times a day (BID) | ORAL | Status: DC
  Filled 2014-06-11: qty 0.5

## 2014-06-11 MED ORDER — INSULIN ASPART 100 UNIT/ML ~~LOC~~ SOLN
0.0000 [IU] | Freq: Three times a day (TID) | SUBCUTANEOUS | Status: DC
  Administered 2014-06-12: 3 [IU] via SUBCUTANEOUS
  Administered 2014-06-12 – 2014-06-13 (×4): 4 [IU] via SUBCUTANEOUS
  Administered 2014-06-13 – 2014-06-14 (×3): 3 [IU] via SUBCUTANEOUS
  Administered 2014-06-15: 4 [IU] via SUBCUTANEOUS
  Administered 2014-06-15: 3 [IU] via SUBCUTANEOUS
  Administered 2014-06-16 – 2014-06-17 (×4): 4 [IU] via SUBCUTANEOUS
  Administered 2014-06-18: 1 [IU] via SUBCUTANEOUS
  Administered 2014-06-18: 3 [IU] via SUBCUTANEOUS
  Administered 2014-06-19 – 2014-06-20 (×2): 4 [IU] via SUBCUTANEOUS
  Administered 2014-06-21: 3 [IU] via SUBCUTANEOUS

## 2014-06-11 MED ORDER — ONDANSETRON HCL 4 MG PO TABS
4.0000 mg | ORAL_TABLET | Freq: Four times a day (QID) | ORAL | Status: DC | PRN
  Administered 2014-06-14: 4 mg via ORAL
  Filled 2014-06-11 (×2): qty 1

## 2014-06-11 MED ORDER — INSULIN ASPART 100 UNIT/ML ~~LOC~~ SOLN: 0.0000 [IU] | Freq: Three times a day (TID) | SUBCUTANEOUS | Status: DC

## 2014-06-11 MED ORDER — PSYLLIUM 95 % PO PACK
1.0000 | PACK | Freq: Every day | ORAL | Status: DC
  Administered 2014-06-11 – 2014-06-20 (×9): 1 via ORAL
  Filled 2014-06-11 (×13): qty 1

## 2014-06-11 MED ORDER — IOHEXOL 300 MG/ML  SOLN
25.0000 mL | INTRAMUSCULAR | Status: AC
  Administered 2014-06-11 (×2): 25 mL via ORAL

## 2014-06-11 MED ORDER — COLCHICINE 0.6 MG PO TABS: 0.3000 mg | ORAL_TABLET | Freq: Every day | ORAL | Status: DC

## 2014-06-11 MED ORDER — PREDNISONE 10 MG PO TABS: 10.0000 mg | ORAL_TABLET | Freq: Every day | ORAL | Status: DC

## 2014-06-11 MED ORDER — CLOPIDOGREL BISULFATE 75 MG PO TABS
75.0000 mg | ORAL_TABLET | Freq: Every day | ORAL | Status: DC
  Administered 2014-06-12 – 2014-06-23 (×12): 75 mg via ORAL
  Filled 2014-06-11 (×14): qty 1

## 2014-06-11 MED ORDER — ACETAMINOPHEN 325 MG PO TABS
325.0000 mg | ORAL_TABLET | ORAL | Status: DC | PRN
  Administered 2014-06-17 – 2014-06-20 (×4): 650 mg via ORAL
  Filled 2014-06-11 (×5): qty 2

## 2014-06-11 NOTE — Progress Notes (Signed)
Inpatient Diabetes Program Recommendations  AACE/ADA: New Consensus Statement on Inpatient Glycemic Control (2013)  Target Ranges:  Prepandial:   less than 140 mg/dL      Peak postprandial:   less than 180 mg/dL (1-2 hours)      Critically ill patients:  140 - 180 mg/dL   Results for Darrell Richardson, Darrell Richardson (MRN [id number]) as of 06/11/2014 09:21  Ref. Range 06/10/2014 06:10 06/10/2014 11:30 06/10/2014 16:31 06/10/2014 21:18 06/11/2014 06:19  Glucose-Capillary Latest Range: 70-99 mg/dL 159 (H) 145 (H) 269 (H) 233 (H) 125 (H)    Reason for Visit: Acute on Chronic CHF  Diabetes history: DM 2 Outpatient Diabetes medications: Januvia 50 mg Daily Current orders for Inpatient glycemic control: Lantus 18 units Daily, Novolog 0-20 units TID  Inpatient Diabetes Program Recommendations Correction (SSI): Patient's glucose last night at 2118 was 233 mg/dl. Please consider adding Novolog 0-5 units QHS for bedtime coverage. Insulin - Meal Coverage: Please consider ordering Novolog 3 units TID with meals for meal coverage (in addition to Novolog correction).  Thanks,  Tama Headings RN, MSN, [facility] Inpatient Diabetes Coordinator Team Pager [phone]

## 2014-06-11 NOTE — Progress Notes (Deleted)
TEAM 1 - Stepdown/ICU TEAM Progress Note  TYRIEK HOFMAN EUM:[id number] DOB: 12-05-1940 DOA: 05/31/2014 PCP: [name], MD  Admit HPI / Brief Narrative: 74 y.o. M Hx diabetes type II uncontrolled, CHF, HTN, CKD, and CVA w/ residual Lt Hemiparesis who presented to [facility] complaining of shortness of breath. He was found to be in acute respiratory failure secondary to an acute CHF exacerbation, placed on BiPAP, and sent to [facility] for further evaluation. Acute respiratory failure resolved, he is currently back on room air. In the ED troponin was negative, creatinine 2.44, hemoglobin 13.3. BNP was 7363. Patient was given 1 dose of Lasix and sent for further evaluation. - Patient is known to have chronic kidney disease at baseline, function continues to worsen during hospital stay, patient has been followed by nephrology service, patient does not require hemodialysis yet, but he is agreeable if he needs it, continue peaked at 5.7, but continued to improve after that.  HPI/Subjective: Today reports no chest pain, no abdom pain.  Reports lower extremity weakness,  as well still complains of joint pain mainly in bilateral lower ankles and knees.  He can move his legs, and has not lost feeling in his legs.  He denies sob, n/v, or HA.    Assessment/Plan:  Initial Fever / polyarthralgias / myalgias -no acute findings on x-rays, PT/OT as tolerated, knee pain medication prior to physical therapy, plan is for discharge to CIR. - Unclear etiology , wide differential includes.( His cost with rheumatology service at [facility] well, and [facility] [place]),   A-   autoimmune disease, especially with initial elevation of ESR at 135, went down to 28 after starting steroids, including PMR , dermatomyositis, vasculitis, rheumatoid arthritis (unlikely with negative rheumatoid factor), hydralazine induced lupus( not likely w/ negative histone ab ), but the fact he is not improving  on 8 days on steroids make it less likely. As well rheumatoid factor, ANA, ANCA, , complement, all negative. B-  infectious etiology including RMSF v/s Lyme v/s ehrlichiosis , started empirically on doxycycline pending his Lyme workup, currently workup was negative for Ehrlichia, lyme, RMSF. 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 D- Gout, as thought by orthopedic consult, resumed back on allopurinol, will start on low dose colchicine given his renal function, can give any NSAIDs especially with his renal function. E- Sarcoidisis  With Lofgren's syndrome ( low probability ) , will await to see CT chest showing any hilar lymphadenopathy     Acute on chronic respiratory failure with hypoxia  -Resolved, initially on BiPAP, currently back on Coumadin or  Acute renal failure on CKD stage III  - Nephrology consult appreciated. -Baseline crt 2.5-2.8 - overall renal fxn has worsened significantly during this hospital stay, went as high as 5.7, but his crt is presently slowly improving - Nephrology following, avoid nephrotoxic medication.  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   History 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 - per wife did not attend inpatient rehabilitation  -plan is for CIR placement once acute issues resolved   Gout -Uric acid  8.3 - goal <6  -Continue steroid, but begin to taper from high current dose    Code Status: FULL Family Communication:  Wife at bedside Disposition Plan: Pending further workup Consultants: Heart Failure Team  Nephrology  Neurology Orthopedics  Procedure/Significant Events: 2/22 TTE - Left ventricle: mild LVH. -LVEF= 40% to 45%. akinesis of inferolateral and inferior myocardium -grade 2 diastolic dysfunction - Left atrium: severely dilated - Pericardium, extracardiac: A small pericardial effusion was identified 2/29 TEE - No TEE evidence of endocarditis - EF = 40-45%  Antibiotics: Azithromycin 2/23 > 2/24 Vancomycin 2/23 > 2/24 Doxy 3/1 > 3/4  DVT prophylaxis: Subcutaneous heparin  Objective: Blood pressure 130/50, pulse 61, temperature 98.2 F (36.8 C), temperature source Oral, resp. rate 18, height 5' 8"  (1.727 m), weight 103.874 kg (229 lb), SpO2 98 %.  Intake/Output Summary (Last 24 hours) at 06/11/14 1316 Last data filed at 06/11/14 1106  Gross per 24 hour  Intake    653 ml  Output   2550 ml  Net  -1897 ml   Exam: General: alert and conversant - no acute resp distress  Lungs: Clear to auscultation bilaterally without wheezes or crackles Cardiovascular: Regular rate and rhythm without murmur gallop or rub  Abdomen: Nontender, nondistended, soft, bowel sounds positive, no rebound, no ascites, no appreciable mass Extremities: No significant cyanosis, clubbing, or edema bilateral lower extremities - no joint erythema or effusions noted B LE  Neuro:  4/5 strength B LE - intact sensation touch th/o , movement limited due to pain  Data Reviewed: Basic Metabolic Panel:  Recent Labs Lab 06/05/14 0300 06/06/14 0230 06/07/14 0653 06/07/14 1820 06/08/14 0205 06/09/14 0309 06/10/14 0444 06/11/14 0400  NA 132* 130* 127* 126* 128* 132* 134* 134*  K 4.7 5.0 5.5* 5.6* 5.1 4.6 4.0 4.7  CL 96 94* 92* 93* 91* 96 97 101  CO2 23 19 23 21 22 21 25 25   GLUCOSE 184* 204*  191* 336* 345* 212* 161* 145*  BUN 84* 104* 126* 133* 140* 148* 145* 138*  CREATININE 5.56* 5.71* 5.59* 5.37* 5.27* 4.36* 4.02* 3.36*  CALCIUM 7.3* 7.3* 7.0* 6.8* 6.9* 7.1* 7.4* 7.5*  MG 2.3 2.4 2.7*  --   --   --   --   --   PHOS  --   --   --   --  8.0*  --   --  6.8*   Liver Function Tests:  Recent Labs Lab 06/05/14 0300 06/06/14 0230 06/07/14 0653 06/08/14 0205 06/10/14 0444 06/11/14 0400  AST 31 36 33  --  21  --   ALT 27 37 36  --  32  --   ALKPHOS 81 83 79  --  72  --   BILITOT 0.4 0.4 0.7  --  0.6  --   PROT 5.6* 5.7* 5.9*  --  5.5*  --   ALBUMIN 1.8* 1.9* 1.9* 1.9* 2.2* 2.3*    CBC:  Recent Labs Lab 06/05/14 0300 06/06/14 0230 06/07/14 0653 06/08/14 0205 06/09/14 0309 06/11/14 0400  WBC 8.0 6.7 5.9 4.4 7.6 9.1  NEUTROABS 6.9 5.9 5.0  --   --   --   HGB 10.0* 10.1* 10.9* 9.6* 10.8* 11.8*  HCT 29.1* 29.3* 31.1* 27.7*  30.7* 34.0*  MCV 85.1 84.2 83.8 82.4 83.4 84.2  PLT 204 239 PLATELET CLUMPS NOTED ON SMEAR, COUNT APPEARS ADEQUATE 259 313 330   CBG:  Recent Labs Lab 06/10/14 1130 06/10/14 1631 06/10/14 2118 06/11/14 0619 06/11/14 1045  GLUCAP 145* 269* 233* 125* 163*    Recent Results (from the past 240 hour(s))  Respiratory virus panel (routine influenza)     Status: None   Collection Time: 06/01/14  9:01 PM  Result Value Ref Range Status   Source - RVPAN NASAL SWAB  Corrected   Respiratory Syncytial Virus A Negative Negative Final   Respiratory Syncytial Virus B Negative Negative Final   Influenza A Negative Negative Final   Influenza B Negative Negative Final   Parainfluenza 1 Negative Negative Final   Parainfluenza 2 Negative Negative Final   Parainfluenza 3 Negative Negative Final   Metapneumovirus Negative Negative Final   Rhinovirus Negative Negative Final   Adenovirus Negative Negative Final    Comment: (NOTE) Performed At: [facility] [address] [place], [place] [id number] Lindon Romp MD [place]:[id number]     Culture, blood (routine x 2)     Status: None   Collection Time: 06/01/14 10:33 PM  Result Value Ref Range Status   Specimen Description BLOOD RIGHT HAND  Final   Special Requests BOTTLES DRAWN AEROBIC ONLY 5CC  Final   Culture   Final    NO GROWTH 5 DAYS Performed at [facility]    Report Status 06/08/2014 FINAL  Final  Culture, blood (routine x 2)     Status: None   Collection Time: 06/01/14 10:45 PM  Result Value Ref Range Status   Specimen Description BLOOD LEFT HAND  Final   Special Requests BOTTLES DRAWN AEROBIC ONLY 4CC  Final   Culture   Final    NO GROWTH 5 DAYS Performed at [facility]    Report Status 06/08/2014 FINAL  Final    Studies:  Recent x-ray studies have been reviewed in detail by the Attending Physician  Scheduled Meds:  Scheduled Meds: . antiseptic oral rinse  7 mL Mouth Rinse BID  . clopidogrel  75 mg Oral Daily  . doxycycline  100 mg Oral Q12H  . heparin  5,000 Units Subcutaneous 3 times per day  . hydrALAZINE  12.5 mg Oral BID  . insulin aspart  0-20 Units Subcutaneous TID WC  . insulin glargine  18 Units Subcutaneous Daily  . iohexol  25 mL Oral Q1 Hr x 2  . [START ON 06/12/2014] isosorbide mononitrate  30 mg Oral Daily  . methylPREDNISolone (SOLU-MEDROL) injection  125 mg Intravenous Q24H  . metoprolol succinate  25 mg Oral BID  . [START ON 06/12/2014] predniSONE  40 mg Oral Q breakfast   Followed by  . [START ON 06/13/2014] predniSONE  30 mg Oral Q breakfast   Followed by  . [START ON 06/14/2014] predniSONE  20 mg Oral Q breakfast   Followed by  . [START ON 06/15/2014] predniSONE  10 mg Oral Q breakfast  . pregabalin  75 mg Oral Daily  . psyllium  1 packet Oral QHS  . sertraline  50 mg Oral QHS  . sodium chloride  3 mL Intravenous Q12H    Time spent on care of this patient: 35 mins  Phillips Climes , MD [facility] [phone]  Contact MD directly via text page:      [email or web address]      password [facility]  06/11/2014,  1:16 PM  LOS: 11 days

## 2014-06-11 NOTE — Progress Notes (Signed)
CARE MANAGEMENT NOTE 06/11/2014  Patient:  Darrell Richardson, Darrell Richardson   Account Number:  [id number]  Date Initiated:  05/31/2014  Documentation initiated by:  Elissa Hefty  Subjective/Objective Assessment:   adm w heart failure, shortness of breath     Action/Plan:   lives w fam, pcp dr mark Freddi Starr   Anticipated DC Date:  06/11/2014   Anticipated DC Plan:  [place]  CM consult      [facility] Choice  HOME HEALTH   Choice offered to / List presented to:  C-3 Spouse           Status of service:  Completed, signed off Medicare Important Message given?  YES (If response is "NO", the following Medicare IM given date fields will be blank) Date Medicare IM given:  06/07/2014 Medicare IM given by:  Elissa Hefty Date Additional Medicare IM given:  06/11/2014 Additional Medicare IM given by:  Jonnie Finner  Discharge Disposition:  IP REHAB FACILITY  Per UR Regulation:  Reviewed for med. necessity/level of care/duration of stay  If discussed at [place] of Stay Meetings, dates discussed:   06/08/2014  06/10/2014    Comments:  06/11/2014 1015 Scheduled dc to IP rehab. Jonnie Finner RN CCM Case Mgmt phone [phone]   2/23 1511 debbie dowell rn,bsn spoke w pt and wife reg hhc. they had dealt w hhc agency that was more of hospice and only take chf if 44mos or less. wife decided to use different agency and chose [facility] for hhrn. ref to [facility] for hhrn for chf and dis mangtmt.

## 2014-06-11 NOTE — Progress Notes (Signed)
Pt transferred to Rehab from 3E25. Alert and orientated x3. Pt and family education regarding rehab expectations,    schedule, and safety concerns.

## 2014-06-11 NOTE — Consult Note (Signed)
Patient ID: Darrell Richardson MRN: [id number] DOB/AGE: [birth year] 74 y.o.  Admit date: 05/31/2014  Admission Diagnoses:  Principal Problem:   CHF, acute on chronic Active Problems:   Diabetes   Hypertension   Stroke   Acute respiratory failure with hypoxia   CKD (chronic kidney disease), stage III   CHF (congestive heart failure)   Secondary cardiomyopathy   Systolic and diastolic CHF, acute on chronic   Acute on chronic respiratory failure with hypoxia   Essential hypertension   Diabetes type 2, uncontrolled   Stroke (cerebrum)   Congestive dilated cardiomyopathy   Acute on chronic renal failure   Acute idiopathic gout of right hand   Benign essential hypertension antepartum   Primary gout   Pyrexia   Congestive heart disease   Myalgia   Arthralgia   Palliative care encounter   Weakness of both lower extremities   HPI: Patient is being seen for right > left knee pain and bilat ankle/foot pain.  Has had increased pain and swelling in these areas for the last week.  States that he has a known hx of gout but attacks have never been to this extent.  Worsening renal disease since Dec 2015 per patient and wife. .  S/p ORIF right ankle 1981.  Increased pain with movement and weightbearing.  Currently on IV steroids.    Past Medical History: Past Medical History  Diagnosis Date  . Hearing loss   . Gout   . Diabetes   . Fatigue   . Hypertension   . Stroke     X 2  . Heart failure   . Muscle pain   . Hammertoe     Surgical History: Past Surgical History  Procedure Laterality Date  . Stomach surgery    . Foot surgery Right   . Tee without cardioversion N/A 06/07/2014    Procedure: TRANSESOPHAGEAL ECHOCARDIOGRAM (TEE);  Surgeon: Jolaine Artist, MD;  Location: [facility];  Service: Cardiovascular;  Laterality: N/A;    Family History: Family History  Problem Relation Age of Onset  . Dementia Mother     Social History: History   Social History  .  Marital Status: Married    Spouse Name: N/A  . Number of Children: N/A  . Years of Education: N/A   Occupational History  . Not on file.   Social History Main Topics  . Smoking status: Former Research scientist (life sciences)  . Smokeless tobacco: Not on file  . Alcohol Use: Yes  . Drug Use: No  . Sexual Activity: Not on file   Other Topics Concern  . Not on file   Social History Narrative    Allergies: Cortisone; Ambien; Rocephin; and Levaquin  Medications: I have reviewed the patient's current medications.  Vital Signs: Patient Vitals for the past 24 hrs:  BP Temp Temp src Pulse Resp SpO2 Weight  06/11/14 0944 (!) 130/50 mmHg - - 61 18 98 % -  06/11/14 0623 (!) 171/57 mmHg 98.2 F (36.8 C) Oral 67 17 99 % 103.874 kg (229 lb)  06/10/14 2112 (!) 128/49 mmHg 97.8 F (36.6 C) Oral 66 18 99 % -  06/10/14 1345 (!) 146/57 mmHg 98 F (36.7 C) Oral 67 18 98 % -    Radiology: Dg Knee 1-2 Views Left  06/10/2014   CLINICAL DATA:  Left medial knee pain.  Trouble walking.  EXAM: LEFT KNEE - 1-2 VIEW  COMPARISON:  None.  FINDINGS: There is no evidence of fracture, dislocation, or joint  effusion. There is no lytic or sclerotic osseous lesion. 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 There is mild patellofemoral osteoarthritis. There is peripheral vascular atherosclerotic disease. Soft tissues are unremarkable.  IMPRESSION: No acute osseous injury of the left knee.   Electronically Signed   By: Kathreen Devoid   On: 06/10/2014 18:37   Dg Knee 1-2 Views Right  06/10/2014   CLINICAL DATA:  Severe lower body pain. No known injury. Bilateral hip pain. Trouble walking. Right lateral knee pain. Left medial knee pain.  EXAM: RIGHT KNEE - 1-2 VIEW  COMPARISON:  None.  FINDINGS: No acute fracture or dislocation. Moderate joint effusion. Tricompartmental osteoarthritis of the right knee most severe in the medial femorotibial compartment. Chondrocalcinosis of the lateral  femorotibial compartment as can be seen with CPPD. There is peripheral vascular atherosclerotic disease.  IMPRESSION: No acute osseous injury of the right knee.  Tricompartmental osteoarthritis of the right knee.   Electronically Signed   By: Kathreen Devoid   On: 06/10/2014 18:27   Dg Ankle Complete Left  06/10/2014   CLINICAL DATA:  Lower body pain.  Trouble walking.  EXAM: LEFT ANKLE COMPLETE - 3+ VIEW  COMPARISON:  None.  FINDINGS: There is no evidence of fracture, dislocation, or joint effusion. The ankle mortise is intact. There is a plantar calcaneal spur. There are enthesopathic changes at the Achilles tendon insertion. There are degenerative changes of the talonavicular joint. There is mild osteoarthritis of the tibiotalar joint. There is mild osteoarthritis of the first TMT joint. There is peripheral vascular atherosclerotic disease. Soft tissues are unremarkable.  IMPRESSION: 1. No acute osseous injury of the left ankle.   Electronically Signed   By: Kathreen Devoid   On: 06/10/2014 18:35   Dg Ankle Complete Right  06/10/2014   CLINICAL DATA:  Lower body pain.  Trouble walking.  EXAM: RIGHT ANKLE - COMPLETE 3+ VIEW  COMPARISON:  None.  FINDINGS: There is no evidence of fracture, dislocation, or joint effusion. There is a distal fibular side plate with multiple interlocking screws without failure or complication. There is a medial malleolar screw and a medial malleolar K-wire without failure or complication. The ankle mortise is intact. There is a plantar calcaneal spur. There are enthesopathic changes at the Achilles tendon insertion. There are degenerative changes of the talonavicular joint. There is osteoarthritis of the tibiotalar joint. Soft tissues are unremarkable.  IMPRESSION: 1. No acute osseous injury of the right ankle. 2. 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   Electronically Signed   By: Kathreen Devoid   On: 06/10/2014 18:34   Mr Lumbar Spine Wo Contrast  06/09/2014    CLINICAL DATA:  Focal lower extremity weakness. History of generalized weakness. No acute back pain, bowel or bladder symptoms. Initial encounter.  EXAM: MRI LUMBAR SPINE WITHOUT CONTRAST  TECHNIQUE: Multiplanar, multisequence MR imaging of the lumbar spine was performed. No intravenous contrast was administered.  COMPARISON:  Abdominal pelvic CT 08/13/2013.  FINDINGS: The lumbar spine examination is mildly motion degraded, especially on the axial images. CT demonstrates 5 lumbar type vertebral bodies. The alignment is stable and near anatomic. The marrow signal within the spine is mildly heterogeneous without focally suspicious lesion. There are foci of decreased T1 and T2 signal in the left iliac bone without clear corresponding finding on prior CT.  The conus medullaris extends to the T12-L1 level and appears normal. No focal paraspinal abnormalities are identified. There is  subcutaneous edema in the back.  No significant disc space findings are seen from T10-11 through L2-3. There are paraspinal osteophytes anteriorly in the lower thoracic spine.  L3-4: Mild annular disc bulging. There is facet and ligamentous hypertrophy with bilateral facet joint effusions. These factors contribute to mild spinal stenosis with mild narrowing of both lateral recesses.  L4-5: Mild annular disc bulging. There is moderate facet and ligamentous hypertrophy with bilateral facet joint effusions. These factors contribute to moderate spinal stenosis with asymmetric narrowing of the right lateral recess and right foramen. There is mild subchondral edema within the facet joints without bone destruction or periarticular fluid collection.  L5-S1: Disc height and hydration are maintained. Mild bilateral facet hypertrophy. No spinal stenosis or nerve root encroachment.  IMPRESSION: 1. Moderate facet and ligamentous hypertrophy with facet joint effusions and subchondral edema bilaterally at L3-4 and L4-5. 2. Associated annular disc bulging at  both levels, contributing to mild spinal stenosis at L3-4 and moderate spinal stenosis at L4-5. 3. Nonspecific marrow lesions within the left iliac bone. Early metastatic disease cannot be completely excluded. No suspicious lesions demonstrated within the lumbar spine.   Electronically Signed   By: Richardean Sale M.D.   On: 06/09/2014 17:06   Nm Bone Scan Whole Body  06/10/2014   CLINICAL DATA:  Lower extremity weakness and pain. History of diabetes, hypertension and stroke. Indeterminate lesions in left iliac bone on MRI. No given history of malignancy.  EXAM: NUCLEAR MEDICINE WHOLE BODY BONE SCAN  TECHNIQUE: Whole body anterior and posterior images were obtained approximately 3 hours after intravenous injection of radiopharmaceutical.  RADIOPHARMACEUTICALS:  25.0 mCi Technetium-99 MDP  COMPARISON:  Lumbar spine and pelvic MRI 06/09/2014.  FINDINGS: There is no osseous activity suspicious for metastatic disease. Specifically, no abnormal activity is seen within the left iliac bone or remainder of the pelvis. There is scattered arthropathic activity within the large joints, primarily the shoulders, elbows and knees. There is also some arthropathic activity throughout the hands and feet bilaterally. Activity is present within the Foley catheter bag between lower legs.  IMPRESSION: No bone scan evidence of osseous metastatic disease. Scattered arthropathic activity.   Electronically Signed   By: Richardean Sale M.D.   On: 06/10/2014 15:42   US Renal  05/31/2014   CLINICAL DATA:  Hypertension.  History of diabetes.  EXAM: RENAL/URINARY TRACT ULTRASOUND COMPLETE  COMPARISON:  None.  FINDINGS: Right Kidney:  Length: 12.4 cm. Borderline increased parenchymal echogenicity and mild diffuse cortical thinning. Normal overall renal parenchymal echogenicity. Several small cysts, largest arising from the cortex of the upper pole measuring 2 cm. No solid renal masses. No hydronephrosis.  Left Kidney:  Length: 12.7 cm.  Lobulated contour with mild diffuse cortical thinning. Normal parenchymal echogenicity. 2 cm lower pole cortical cyst. Smaller cysts lies adjacent to this measuring 13 mm. No solid renal masses. No hydronephrosis.  Bladder:  Appears normal for degree of bladder distention.  IMPRESSION: 1. No hydronephrosis. 2. Small bilateral renal cysts. Borderline increased parenchymal echogenicity from the right kidney. Bilateral, left greater than right, renal cortical thinning.   Electronically Signed   By: Lajean Manes M.D.   On: 05/31/2014 17:32   Mr Sacrum/si Joints Wo Contrast  06/09/2014   CLINICAL DATA:  Focal lower extremity weakness. History of generalized weakness. No acute back pain, bowel or bladder symptoms. Initial encounter.  EXAM: MR SACRUM WITHOUT CONTRAST  TECHNIQUE: Multiplanar, multisequence MR imaging was performed. No intravenous contrast was administered.  COMPARISON:  Pelvic  CT 08/13/2013.  FINDINGS: Lumbar spine findings dictated separately.  No evidence of sacral fracture or destruction. The sacroiliac joints are intact without erosive change. There is no presacral fluid collection. There is no sacral nerve foraminal compromise or nerve root encroachment.  There is mildly heterogeneous marrow signal within the sacrum and iliac bones on the T1 weighted images. No suspicious lesions demonstrated on T2 or inversion recovery imaging.  Foley catheter is in place. The bladder is decompressed and demonstrates mild wall thickening. There is nodularity throughout the peripheral and central portions of the prostate gland. Asymmetric fat in the right inguinal canal is incompletely visualized, although grossly stable. There is nonspecific subcutaneous edema dependently in the buttocks as well as mild perirectal edema.  IMPRESSION: 1. Negative MRI of the sacrum and sacroiliac joints. 2. No evidence of sacral nerve root encroachment. 3. Lumbar spine findings dictated separately.   Electronically Signed   By:  Richardean Sale M.D.   On: 06/09/2014 16:54   Nm Myocar Multi W/spect W/wall Motion / Ef  06/02/2014   CLINICAL DATA:  Mr Kronenberger is a 74 year old with a history of diabetes, CHF (EF unknown), CKD creatnine baseline 2.5, and CVA 2009-2010.  EXAM: MYOCARDIAL IMAGING WITH SPECT (PHARMACOLOGIC-STRESS)  GATED LEFT VENTRICULAR WALL MOTION STUDY  LEFT VENTRICULAR EJECTION FRACTION  TECHNIQUE: Intravenous infusion of Lexiscan was performed under the supervision of the Cardiology staff. At peak effect of the drug, 10 mCi Tc-28m sestamibi was injected intravenously and standard myocardial SPECT imaging was performed. Quantitative gated imaging was also performed to evaluate left ventricular wall motion, and estimate left ventricular ejection fraction.  COMPARISON:  None.  FINDINGS: Perfusion: There is a large and severe inferior and inferoseptal perfusion defect with minimal reversibility around the defect edges. This is consistent with a large infarction in the right coronary artery distribution with mild peri-infarct ischemia.  Wall Motion: Mild global hypokinesis with inferior wall akinesis. Moderate left ventricular dilation.  Left Ventricular Ejection Fraction: 34 %  End diastolic volume 798 ml  End systolic volume 921 ml  IMPRESSION: 1. Scar with mild peri-infarct ischemia consistent with infarction in the entire RCA distribution  2.  Inferior wall akinesis.  3. Left ventricular ejection fraction 34%  4. High-risk stress test findings*. Minimal reversible ischemia. Moderately severe ischemic cardiomyopathy  *2012 Appropriate Use Criteria for Coronary Revascularization Focused Update: J Am Coll Cardiol. 1941;74(0):814-481. http://content.airportbarriers.com.aspx?articleid=1201161   Electronically Signed   By: Sanda Klein   On: 06/02/2014 13:42   Nm Pulmonary Perf And Vent  06/05/2014   CLINICAL DATA:  Dyspnea. History of gout, diabetes, hypertension, stroke, heart failure.  EXAM: NUCLEAR MEDICINE VENTILATION  - PERFUSION LUNG SCAN  TECHNIQUE: Ventilation images were obtained in multiple projections using inhaled aerosol technetium 99 M DTPA. Perfusion images were obtained in multiple projections after intravenous injection of Tc-20m MAA.  RADIOPHARMACEUTICALS:  40.0 mCi Tc-50m DTPA aerosol and 6.0 mCi Tc-2m MAA  COMPARISON:  Chest x-ray 06/03/2014  FINDINGS: Ventilation: No focal ventilation defect.  Perfusion: No wedge shaped peripheral perfusion defects to suggest acute pulmonary embolism.  IMPRESSION: Normal ventilation perfusion scan.   Electronically Signed   By: Nolon Nations M.D.   On: 06/05/2014 14:52   Dg Chest Port 1 View  06/06/2014   CLINICAL DATA:  74 year old male with congestive heart failure  EXAM: PORTABLE CHEST - 1 VIEW  COMPARISON:  Prior chest x-ray 06/03/2014  FINDINGS: Stable cardiomegaly with left heart prominence. Decreased interstitial pulmonary edema but persistent pulmonary vascular  congestion. Left retrocardiac opacity remains unchanged. No large pleural effusion and no evidence of pneumothorax. No acute osseous abnormality.  IMPRESSION: 1. Resolving pulmonary interstitial edema with persistent vascular congestion. 2. Unchanged left retrocardiac opacity favored to reflect a combination of atelectasis and perhaps pleural effusion.   Electronically Signed   By: Jacqulynn Cadet M.D.   On: 06/06/2014 12:06   Dg Chest Port 1 View  06/03/2014   CLINICAL DATA:  Shortness of breath, flu symptoms  EXAM: PORTABLE CHEST - 1 VIEW  COMPARISON:  Portable chest x-ray of May 31, 2014  FINDINGS: The lungs are adequately inflated. The interstitial markings are less congested today. The cardiac silhouette remains enlarged. The pulmonary vascularity remains engorged but there is less cephalization of the vascular pattern. The retrocardiac region on the left is less dense.  IMPRESSION: Findings consistent with mild improvement in pulmonary interstitial edema secondary to CHF. When the patient can  tolerate the procedure, a PA and lateral chest x-ray would be useful.   Electronically Signed   By: David  Martinique   On: 06/03/2014 07:30   Dg Chest Port 1 View  05/31/2014   CLINICAL DATA:  Shortness of breath and congestion.  History of CHF.  EXAM: PORTABLE CHEST - 1 VIEW  COMPARISON:  None.  FINDINGS: Trachea is midline. Heart is enlarged. There is perihilar and basilar predominant mixed interstitial and airspace opacification. Probable bilateral effusions.  Degenerative changes are seen in the shoulders.  IMPRESSION: Congestive heart failure.   Electronically Signed   By: Lorin Picket M.D.   On: 05/31/2014 18:29   Dg Hips Bilat With Pelvis 3-4 Views  06/10/2014   CLINICAL DATA:  Lower body pain. Trouble walking. Bilateral hip pain.  EXAM: BILATERAL HIP (WITH PELVIS) 3-4 VIEWS  COMPARISON:  None.  FINDINGS: No fracture or dislocation. Joint spaces are maintained. No lytic or sclerotic osseous lesion. Peripheral vascular atherosclerotic disease.  IMPRESSION: No acute osseous injury of bilateral hips.   Electronically Signed   By: Kathreen Devoid   On: 06/10/2014 18:38    Labs:  Recent Labs  06/09/14 0309 06/11/14 0400  WBC 7.6 9.1  RBC 3.68* 4.04*  HCT 30.7* 34.0*  PLT 313 330    Recent Labs  06/10/14 0444 06/11/14 0400  NA 134* 134*  K 4.0 4.7  CL 97 101  CO2 25 25  BUN 145* 138*  CREATININE 4.02* 3.36*  GLUCOSE 161* 145*  CALCIUM 7.4* 7.5*   No results for input(s): LABPT, INR in the last 72 hours.  Review of Systems: Review of Systems  Musculoskeletal: Positive for joint pain (bilat knees, ankles , feet. ).    Physical Exam: Pleasant wm alert and oriented.  Appears to be in discomfort while lying in bed.  Left knee minimally tender.  Right knee mod-marked joint line tenderness.  Min swelling bilat knees without significant palpable effusion.  bilat ankles/ midfoot/1st mtp joints are markedly tender.  Some swelling.  No signs of infection.  bilat calves nontender.     Assessment and Plan: bilat knee, ankle, and foot pain.  Likely gout attack.  Elevated uric acid. Chronic kidney disease.  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  Right > left ankle djd.  Posttraumatic right ankle djd.  S/p ORIF  Recommend continuing the IV steroids.  Don't think intra-articular injections would be overly beneficial at this time since pain is so widespread.  Discussed with my attending Dr Rodell Perna and he will see patient tomorrow during rounds. Continue present  care.    Alyson Locket. [facility] [employer].

## 2014-06-11 NOTE — Progress Notes (Signed)
Progress Note from the Palliative Medicine Team at [facility]   Mr. Beckford is sleeping and his wife is at bedside. They are awaiting CT scans and then transfer to CIR. Mrs. Mcnerney says that she believes his pain is slowly improving and he was feeling a little better. She is anxious to get CT results and hopefully continue recovery without further complications. She does say that she spoke to him about DNR and that he seemed inclined to DNR but he was not fully alert with pain medication and she would like [place] to discuss this when he is more alert. I will continue to follow and support while in CIR.   Vinie Sill, NP Palliative Medicine Team Pager # [phone] (M-F 8a-5p) Team Phone # [phone] (Nights/Weekends)

## 2014-06-11 NOTE — Interval H&P Note (Signed)
Darrell Richardson was admitted today to Inpatient Rehabilitation with the diagnosis of debility.  The patient's history has been reviewed, patient examined, and there is no change in status.  Patient continues to be appropriate for intensive inpatient rehabilitation.  I have reviewed the patient's chart and labs.  Questions were answered to the patient's satisfaction.  SWARTZ,ZACHARY T 06/11/2014, 6:42 PM

## 2014-06-11 NOTE — Discharge Instructions (Signed)
Follow with Darrell Richardson [name], Richardson in as recommended by accepting facility after discharge.   Get CBC, CMP, 2 view Chest X ray checked  by Darrell Richardson next visit.    Activity: As tolerated with Full fall precautions use walker/cane & assistance as needed   Disposition Home    Diet: Heart Healthy  , with feeding assistance and aspiration precautions as needed.  For Heart failure patients - Check your Weight same time everyday, if you gain over 2 pounds, or you develop in leg swelling, experience more shortness of breath or chest pain, call your Darrell Richardson immediately. Follow Cardiac Low Salt Diet and 1.5 lit/day fluid restriction.   On your next visit with your Darrell care physician please Get Medicines reviewed and adjusted.   Please request your Prim.Richardson to go over all Hospital Tests and Procedure/Radiological results at the follow up, please get all Hospital records sent to your Prim Richardson by signing hospital release before you go home.   If you experience worsening of your admission symptoms, develop shortness of breath, life threatening emergency, suicidal or homicidal thoughts you must seek medical attention immediately by calling 911 or calling your Richardson immediately  if symptoms less severe.  You Must read complete instructions/literature along with all the possible adverse reactions/side effects for all the Medicines you take and that have been prescribed to you. Take any new Medicines after you have completely understood and accpet all the possible adverse reactions/side effects.   Do not drive, operating heavy machinery, perform activities at heights, swimming or participation in water activities or provide baby sitting services if your were admitted for syncope or siezures until you have seen by Darrell Richardson or a Neurologist and advised to do so again.  Do not drive when taking Pain medications.    Do not take more than prescribed Pain, Sleep and Anxiety  Medications  Special Instructions: If you have smoked or chewed Tobacco  in the last 2 yrs please stop smoking, stop any regular Alcohol  and or any Recreational drug use.  Wear Seat belts while driving.   Please note  You were cared for by a hospitalist during your hospital stay. If you have any questions about your discharge medications or the care you received while you were in the hospital after you are discharged, you can call the unit and asked to speak with the hospitalist on call if the hospitalist that took care of you is not available. Once you are discharged, your Darrell care physician will handle any further medical issues. Please note that NO REFILLS for any discharge medications will be authorized once you are discharged, as it is imperative that you return to your Darrell care physician (or establish a relationship with a Darrell care physician if you do not have one) for your aftercare needs so that they can reassess your need for medications and monitor your lab values.

## 2014-06-11 NOTE — Progress Notes (Signed)
Meredith Staggers, MD Physician Signed Physical Medicine and Rehabilitation Consult Note 06/08/2014 5:39 AM  Related encounter: Admission (Current) from 05/31/2014 in [place] [facility]    Expand All Collapse All        Physical Medicine and Rehabilitation Consult Reason for Consult: Multi-medical/congestive heart failure/question RA versus PMR history of CVA Referring Physician: [facility]   HPI: Darrell Richardson is a 74 y.o. right handed male with history of CVA with residual left hemiparesis, diabetes mellitus peripheral neuropathy, gout, diastolic congestive heart failure, chronic renal insufficiency 2.44. Lives with his wife and used a cane prior to admission. Presented to [facility] 05/31/2014 with increasing nonspecific chest pain and shortness of breath and transferred to [facility] for further medical evaluation. Complaints of increased shortness of breath and lower extremity edema. He was found to have acute respiratory failure secondary to acute CHF exacerbation placed on BiPAP. Troponin was negative. BNP 7363 Patient placed on Lasix. Echocardiogram with ejection fraction of 45% with akinesis of the inferior lateral and inferior myocardium. Progressive increase in creatinine function from baseline 2.4-3.9. Renal service is consulted. Renal ultrasound showed no hydronephrosis. Complaints of joint pain low-grade fever with ESR 135 as well as uric acid level 8.3. Patient had been placed on prednisone therapy with workup concerning for PMR versus RA. It was felt prednisone was inducing increased renal function. TEE completed 06/07/2014 showing no vegetation moderate sized PFO with left-to-right shunting at rest. Full workups remain underway. Subcutaneous heparin for DVT prophylaxis. Physical therapy evaluation completed 06/05/2014 with recommendations of physical medicine rehabilitation consult.   Review of Systems  HENT: Positive for hearing loss.    Respiratory: Positive for shortness of breath.  Cardiovascular: Positive for leg swelling.  Gastrointestinal: Positive for constipation.  Musculoskeletal: Positive for myalgias and joint pain.  Psychiatric/Behavioral: Positive for depression.  All other systems reviewed and are negative.  Past Medical History  Diagnosis Date  . Hearing loss   . Gout   . Diabetes   . Fatigue   . Hypertension   . Stroke     X 2  . Heart failure   . Muscle pain   . Hammertoe    Past Surgical History  Procedure Laterality Date  . Stomach surgery    . Foot surgery Right    Family History  Problem Relation Age of Onset  . Dementia Mother    Social History:  reports that he has quit smoking. He does not have any smokeless tobacco history on file. He reports that he drinks alcohol. He reports that he does not use illicit drugs. Allergies:  Allergies  Allergen Reactions  . Cortisone Anaphylaxis and Other (See Comments)    Cold, elevated BP, shaking all over and fever  Wife says tolerates hydrocortisone cream at home  . Ambien [Zolpidem Tartrate]     Did not tolerate well or remembered anything from previous night  . Rocephin [Ceftriaxone] Hives  . Levaquin [Levofloxacin In D5w] Rash   Medications Prior to Admission  Medication Sig Dispense Refill  . albuterol (PROAIR HFA) 108 (90 BASE) MCG/ACT inhaler Inhale 2 puffs into the lungs every 4 (four) hours as needed for wheezing or shortness of breath. Inhale 1-2 puffs four times daily as needed    . allopurinol (ZYLOPRIM) 100 MG tablet Take 100 mg by mouth 2 (two) times daily.    Marland Kitchen amLODipine (NORVASC) 5 MG tablet Take 5 mg by mouth 2 (two) times daily.    Marland Kitchen  atorvastatin (LIPITOR) 20 MG tablet Take 40 mg by mouth daily.     . Cholecalciferol (VITAMIN D3) 1000 UNITS CAPS Take 1,000 Units by mouth daily.    . clopidogrel (PLAVIX) 75 MG  tablet Take 75 mg by mouth daily with breakfast.    . docusate sodium (COLACE) 100 MG capsule Take 100 mg by mouth at bedtime.    . furosemide (LASIX) 80 MG tablet Take 80 mg by mouth daily.    Marland Kitchen HYDROcodone-acetaminophen (VICODIN ES) 7.5-750 MG per tablet Take 1 tablet by mouth every 6 (six) hours as needed for pain. Take 1/2 - 1 four times a day as needed    . losartan (COZAAR) 50 MG tablet Take 50 mg by mouth daily.    . metoprolol succinate (TOPROL-XL) 25 MG 24 hr tablet Take 25 mg by mouth 2 (two) times daily.     . Multiple Vitamin (MULTIVITAMIN) tablet Take 1 tablet by mouth daily.    . pregabalin (LYRICA) 75 MG capsule Take 75 mg by mouth 2 (two) times daily.    . Psyllium (METAMUCIL PO) Take by mouth at bedtime.    . sertraline (ZOLOFT) 50 MG tablet Take 50 mg by mouth at bedtime.    . sitaGLIPtin (JANUVIA) 50 MG tablet Take 50 mg by mouth daily.    . vitamin E 200 UNIT capsule Take 200 Units by mouth daily.      Home: Home Living Family/patient expects to be discharged to:: Private residence Living Arrangements: Spouse/significant other Available Help at Discharge: Family Type of Home: House Home Access: [place]: One [place]: Environmental consultant - 2 wheels, Environmental consultant - 4 wheels, [place] - quad, [place] - single point, Civil engineer, contracting, Grab bars - toilet  Functional History: Prior Function Level of Independence: Independent Comments: occassional use of cane/walker if bad day Functional Status:  Mobility: Bed Mobility Overal bed mobility: Needs Assistance, +2 for physical assistance Bed Mobility: Supine to Sit Supine to sit: Max assist, +2 for physical assistance General bed mobility comments: ised pillow to support BLE (feet) during transitional movements secondary to pain gfrom gout, increased physical assist to elevate trunk to EOB due to stiffness and pain. Transfers Overall transfer level: Needs  assistance Equipment used: 2 person hand held assist (face to face with chuck pad and gait belt) Transfers: Sit to/from Stand, Stand Pivot Transfers Sit to Stand: Max assist, +2 physical assistance Stand pivot transfers: Total assist, +2 physical assistance General transfer comment: Patient with poor physical strenghth and inability to bear weight through BLEs secondary to pain. Ambulation/Gait General Gait Details: unable to perform    ADL:    Cognition: Cognition Overall Cognitive Status: Within Functional Limits for tasks assessed Orientation Level: Oriented X4 Cognition Arousal/Alertness: Awake/alert Behavior During Therapy: WFL for tasks assessed/performed, Anxious Overall Cognitive Status: Within Functional Limits for tasks assessed  Blood pressure 132/66, pulse 77, temperature 97.4 F (36.3 C), temperature source Oral, resp. rate 21, height 5' 8"  (1.727 m), weight 109.7 kg (241 lb 13.5 oz), SpO2 91 %. Physical Exam  Constitutional: He is oriented to person, place, and time.  HENT:  Head: Normocephalic.  Eyes: EOM are normal.  Neck: Normal range of motion. Neck supple. No thyromegaly present.  Cardiovascular:  Cardiac rate controlled  Respiratory:  Good inspiratory effort clear to auscultation  GI: Soft. Bowel sounds are normal. He exhibits no distension.  Musculoskeletal:  Weak grip due to pain in fingers, swelling. Mild swelling at knees and bilateral ankles with associated  mild erythema and warmth. Has pain with AROM and PROm of both knees and ankles.  Neurological: He is alert and oriented to person, place, and time.  Moves all 4's. Strength and ROM limited due to pain. UE's 3+ to 4 delt,bicep,tricep, grip 3. LE: 2+ to 3- HF, KE and 3/5 ankles. No sensory changes  Skin: Skin is warm and dry.  Psychiatric: He has a normal mood and affect. His behavior is normal.     Lab Results Last 24 Hours    Results for orders placed or performed during the hospital  encounter of 05/31/14 (from the past 24 hour(s))  Comprehensive metabolic panel Status: Abnormal   Collection Time: 06/07/14 6:53 AM  Result Value Ref Range   Sodium 127 (L) 135 - 145 mmol/L   Potassium 5.5 (H) 3.5 - 5.1 mmol/L   Chloride 92 (L) 96 - 112 mmol/L   CO2 23 19 - 32 mmol/L   Glucose, Bld 191 (H) 70 - 99 mg/dL   BUN 126 (H) 6 - 23 mg/dL   Creatinine, Ser 5.59 (H) 0.50 - 1.35 mg/dL   Calcium 7.0 (L) 8.4 - 10.5 mg/dL   Total Protein 5.9 (L) 6.0 - 8.3 g/dL   Albumin 1.9 (L) 3.5 - 5.2 g/dL   AST 33 0 - 37 U/L   ALT 36 0 - 53 U/L   Alkaline Phosphatase 79 39 - 117 U/L   Total Bilirubin 0.7 0.3 - 1.2 mg/dL   GFR calc non Af Amer 9 (L) >90 mL/min   GFR calc Af Amer 10 (L) >90 mL/min   Anion gap 12 5 - 15  Magnesium Status: Abnormal   Collection Time: 06/07/14 6:53 AM  Result Value Ref Range   Magnesium 2.7 (H) 1.5 - 2.5 mg/dL  CBC with Differential/Platelet Status: Abnormal   Collection Time: 06/07/14 6:53 AM  Result Value Ref Range   WBC 5.9 4.0 - 10.5 K/uL   RBC 3.71 (L) 4.22 - 5.81 MIL/uL   Hemoglobin 10.9 (L) 13.0 - 17.0 g/dL   HCT 31.1 (L) 39.0 - 52.0 %   MCV 83.8 78.0 - 100.0 fL   MCH 29.4 26.0 - 34.0 pg   MCHC 35.0 30.0 - 36.0 g/dL   RDW 12.7 11.5 - 15.5 %   Platelets  150 - 400 K/uL    PLATELET CLUMPS NOTED ON SMEAR, COUNT APPEARS ADEQUATE   Neutro Abs 5.0 1.7 - 7.7 K/uL   Lymphs Abs 0.3 (L) 0.7 - 4.0 K/uL   Monocytes Absolute 0.6 0.1 - 1.0 K/uL   Eosinophils Absolute 0.0 0.0 - 0.7 K/uL   Basophils Absolute 0.0 0.0 - 0.1 K/uL   Neutrophils Relative % 85 (H) 43 - 77 %   Lymphocytes Relative 6 (L) 12 - 46 %   Monocytes Relative 9 3 - 12 %   Eosinophils Relative 0 0 - 5 %   Basophils Relative 0 0 - 1 %   RBC Morphology POLYCHROMASIA PRESENT   Glucose, capillary Status:  Abnormal   Collection Time: 06/07/14 7:36 AM  Result Value Ref Range   Glucose-Capillary 178 (H) 70 - 99 mg/dL   Comment 1 Notify RN   B. burgdorfi antibodies by WB *Canceled* Status: None ()   Collection Time: 06/07/14 8:38 AM   Narrative   LIS Cancel (ORR/DE = Data Error)  Creatinine, urine, random Status: None   Collection Time: 06/07/14 11:25 AM  Result Value Ref Range   Creatinine, Urine 86.61 mg/dL  Sodium, urine, random  Status: None   Collection Time: 06/07/14 11:25 AM  Result Value Ref Range   Sodium, Ur 21 mmol/L  Glucose, capillary Status: Abnormal   Collection Time: 06/07/14 11:51 AM  Result Value Ref Range   Glucose-Capillary 194 (H) 70 - 99 mg/dL  Cortisol Status: None   Collection Time: 06/07/14 12:14 PM  Result Value Ref Range   Cortisol, Plasma 31.6 ug/dL  TSH Status: None   Collection Time: 06/07/14 12:14 PM  Result Value Ref Range   TSH 1.183 0.350 - 4.500 uIU/mL  Rheumatoid factor Status: None   Collection Time: 06/07/14 12:14 PM  Result Value Ref Range   Rhuematoid fact SerPl-aCnc 12.4 0.0 - 13.9 IU/mL  Glucose, capillary Status: Abnormal   Collection Time: 06/07/14 4:11 PM  Result Value Ref Range   Glucose-Capillary 221 (H) 70 - 99 mg/dL   Comment 1 Capillary Specimen   Basic metabolic panel Status: Abnormal   Collection Time: 06/07/14 6:20 PM  Result Value Ref Range   Sodium 126 (L) 135 - 145 mmol/L   Potassium 5.6 (H) 3.5 - 5.1 mmol/L   Chloride 93 (L) 96 - 112 mmol/L   CO2 21 19 - 32 mmol/L   Glucose, Bld 336 (H) 70 - 99 mg/dL   BUN 133 (H) 6 - 23 mg/dL   Creatinine, Ser 5.37 (H) 0.50 - 1.35 mg/dL   Calcium 6.8 (L) 8.4 - 10.5 mg/dL   GFR calc non Af Amer 10 (L) >90 mL/min   GFR calc Af Amer 11 (L) >90 mL/min   Anion gap 12 5 - 15  Glucose, capillary  Status: Abnormal   Collection Time: 06/07/14 8:54 PM  Result Value Ref Range   Glucose-Capillary 359 (H) 70 - 99 mg/dL   Comment 1 Capillary Specimen   Renal function panel Status: Abnormal   Collection Time: 06/08/14 2:05 AM  Result Value Ref Range   Sodium 128 (L) 135 - 145 mmol/L   Potassium 5.1 3.5 - 5.1 mmol/L   Chloride 91 (L) 96 - 112 mmol/L   CO2 22 19 - 32 mmol/L   Glucose, Bld 345 (H) 70 - 99 mg/dL   BUN 140 (H) 6 - 23 mg/dL   Creatinine, Ser 5.27 (H) 0.50 - 1.35 mg/dL   Calcium 6.9 (L) 8.4 - 10.5 mg/dL   Phosphorus 8.0 (H) 2.3 - 4.6 mg/dL   Albumin 1.9 (L) 3.5 - 5.2 g/dL   GFR calc non Af Amer 10 (L) >90 mL/min   GFR calc Af Amer 11 (L) >90 mL/min   Anion gap 15 5 - 15  CBC Status: Abnormal   Collection Time: 06/08/14 2:05 AM  Result Value Ref Range   WBC 4.4 4.0 - 10.5 K/uL   RBC 3.36 (L) 4.22 - 5.81 MIL/uL   Hemoglobin 9.6 (L) 13.0 - 17.0 g/dL   HCT 27.7 (L) 39.0 - 52.0 %   MCV 82.4 78.0 - 100.0 fL   MCH 28.6 26.0 - 34.0 pg   MCHC 34.7 30.0 - 36.0 g/dL   RDW 12.4 11.5 - 15.5 %   Platelets 259 150 - 400 K/uL      Imaging Results (Last 48 hours)    Dg Chest Port 1 View  06/06/2014 CLINICAL DATA: 74 year old male with congestive heart failure EXAM: PORTABLE CHEST - 1 VIEW COMPARISON: Prior chest x-ray 06/03/2014 FINDINGS: Stable cardiomegaly with left heart prominence. Decreased interstitial pulmonary edema but persistent pulmonary vascular congestion. Left retrocardiac opacity remains unchanged. No large pleural effusion and no  evidence of pneumothorax. No acute osseous abnormality. IMPRESSION: 1. Resolving pulmonary interstitial edema with persistent vascular congestion. 2. Unchanged left retrocardiac opacity favored to reflect a combination of atelectasis and perhaps pleural effusion. Electronically Signed By: Jacqulynn Cadet  M.D. On: 06/06/2014 12:06     Assessment/Plan: Diagnosis: debility after CHF/respiratory failure. ?polyarticular gout 1. Does the need for close, 24 hr/day medical supervision in concert with the patient's rehab needs make it unreasonable for this patient to be served in a less intensive setting? Yes 2. Co-Morbidities requiring supervision/potential complications: dm2, htn,  3. Due to bladder management, bowel management, safety, skin/wound care, disease management, medication administration, pain management and patient education, does the patient require 24 hr/day rehab nursing? Yes 4. Does the patient require coordinated care of a physician, rehab nurse, PT (1-2 hrs/day, 5 days/week) and OT (1-2 hrs/day, 5 days/week) to address physical and functional deficits in the context of the above medical diagnosis(es)? Yes Addressing deficits in the following areas: balance, endurance, locomotion, strength, transferring, bowel/bladder control, bathing, dressing, feeding, grooming, toileting and psychosocial support 5. Can the patient actively participate in an intensive therapy program of at least 3 hrs of therapy per day at least 5 days per week? Yes 6. The potential for patient to make measurable gains while on inpatient rehab is excellent 7. Anticipated functional outcomes upon discharge from inpatient rehab are supervision and min assist with PT, supervision and min assist with OT, n/a with SLP. 8. Estimated rehab length of stay to reach the above functional goals is: 15-20 days 9. Does the patient have adequate social supports and living environment to accommodate these discharge functional goals? Yes 10. Anticipated D/C setting: Home 11. Anticipated post D/C treatments: [place] therapy 12. Overall Rehab/Functional Prognosis: excellent  RECOMMENDATIONS: This patient's condition is appropriate for continued rehabilitative care in the following setting: CIR Patient has agreed to participate in  recommended program. Yes Note that insurance prior authorization may be required for reimbursement for recommended care.  Comment: Rehab Admissions Coordinator to follow up.  Thanks,  Meredith Staggers, MD, Mellody Drown     06/08/2014       Revision History     Date/Time User Provider Type Action   06/08/2014 9:12 AM Meredith Staggers, MD Physician Sign   06/08/2014 6:18 AM Cathlyn Parsons, PA-C Physician Assistant Pend   View Details Report       Routing History     Date/Time From To Method   06/08/2014 9:12 AM Meredith Staggers, MD Meredith Staggers, MD In Basket   06/08/2014 9:12 AM Meredith Staggers, MD [name], MD Fax

## 2014-06-11 NOTE — Discharge Summary (Addendum)
Darrell Richardson, 74 y.o., DOB 01-04-1941, MRN [id number]. Admission date: 05/31/2014 Discharge Date 06/11/2014 Primary MD [name], MD Admitting Physician Verlee Monte, MD   physician at accepting facility please follow on: -  patient is recovering from acute on chronic renal failure , will need daily BMP for the next 3 days , and then at your discretion, please avoid nephrotoxic medications( no NSAIDs), [facility] are aware and following with the patient , these reconsult them if needed . - Patient is on tapering dose prednisone  - Orthopedic has been seen the patient , they will continue to follow on an CIR . - Consider to discontinue Foley catheter and voiding trial when patient is more ambulatory .  Admission Diagnosis  CHF PNEUMONIA r.o endocarditis  Discharge Diagnosis   Principal Problem:   CHF, acute on chronic Active Problems:   Diabetes   Hypertension   Stroke   Acute respiratory failure with hypoxia   CKD (chronic kidney disease), stage III   CHF (congestive heart failure)   Secondary cardiomyopathy   Systolic and diastolic CHF, acute on chronic   Acute on chronic respiratory failure with hypoxia   Essential hypertension   Diabetes type 2, uncontrolled   Stroke (cerebrum)   Congestive dilated cardiomyopathy   Acute on chronic renal failure   Acute idiopathic gout of right hand   Benign essential hypertension antepartum   Primary gout   Pyrexia   Congestive heart disease   Myalgia   Arthralgia   Palliative care encounter   Weakness of both lower extremities   Past Medical History  Diagnosis Date  . Hearing loss   . Gout   . Diabetes   . Fatigue   . Hypertension   . Stroke     X 2  . Heart failure   . Muscle pain   . Hammertoe     Past Surgical History  Procedure Laterality Date  . Stomach surgery    . Foot surgery Right   . Tee without cardioversion N/A 06/07/2014    Procedure: TRANSESOPHAGEAL ECHOCARDIOGRAM (TEE);  Surgeon: Jolaine Artist, MD;  Location: [facility] ENDOSCOPY;  Service: Cardiovascular;  Laterality: N/A;     Hospital Course See H&P, Labs, Consult and Test reports for all details in brief, patient was admitted for **  Principal Problem:   CHF, acute on chronic Active Problems:   Diabetes   Hypertension   Stroke   Acute respiratory failure with hypoxia   CKD (chronic kidney disease), stage III   CHF (congestive heart failure)   Secondary cardiomyopathy   Systolic and diastolic CHF, acute on chronic   Acute on chronic respiratory failure with hypoxia   Essential hypertension   Diabetes type 2, uncontrolled   Stroke (cerebrum)   Congestive dilated cardiomyopathy   Acute on chronic renal failure   Acute idiopathic gout of right hand   Benign essential hypertension antepartum   Primary gout   Pyrexia   Congestive heart disease   Myalgia   Arthralgia   Palliative care encounter   Weakness of both lower extremities  Admit HPI / Brief Narrative: 74 y.o. M Hx diabetes type II uncontrolled, CHF, HTN, CKD, and CVA w/ residual Lt Hemiparesis who presented to [facility] complaining of shortness of breath. He was found to be in acute respiratory failure secondary to an acute CHF exacerbation, placed on BiPAP, and sent to [facility] for further evaluation. Acute respiratory failure resolved, he is currently back on  room air. In the ED troponin was negative, creatinine 2.44, hemoglobin 13.3. BNP was 7363. Patient was given 1 dose of Lasix and sent for further evaluation. - Patient is known to have chronic kidney disease at baseline, function continues to worsen during hospital stay, patient has been followed by nephrology service, creatinine peaked at 5.7, but continued to improve after that., Creatinine is 3.3 at day of discharge.  Initial Fever / polyarthralgias / myalgias -no acute findings on x-rays, overall workup nonrevealing, most likely cause is severe gout flare ( please see  below). Causing significant pain, with significant debilitation, and muscle weakness secondary to this use, plan is for discharge to CIR, and aggressive physical therapy, patient will need pain medication prior to his physical therapy session.  - Unclear etiology , wide differential includes.( discussedwith rheumatology service at [facility] Dr well, and [facility] Dr [place]),  either wouldn't mind seeing him as an outpatient after he is discharged.  A- autoimmune disease was entertained especially with initial elevation of ESR at 135, went down to 28 after starting steroids, including PMR , dermatomyositis, vasculitis, rheumatoid arthritis (unlikely with negative rheumatoid factor), hydralazine induced lupus( not likely w/ negative histone ab ), but the fact he is not improving on 8 days on steroids make it less likely. As well rheumatoid factor, ANA, ANCA, , complement, all negative, autoimmune disease seems unlikely . B- infectious etiology including RMSF v/s Lyme v/s ehrlichiosis , started empirically on doxycycline  initially pending his Lyme workup, currently workup was negative for Ehrlichia, lyme, RMSF, Parvovirus B19. so doxycycline was stopped , discussed with infectious disease over the phone , unlikely related to infectious disease origin . C- paraneoplastic syndrome, patient had abnormal lesion in left iliac bone on lumbar MRI/sacroiliac  MRI ,but bone scan is negative, CT chest/abdomen/pelvis without contrast done, does not show any suspicion of malignancy ,PSA is pending. D- Gout is the most likely contributing factor to patient's symptoms  as thought by orthopedic consult, resumed back on allopurinol, will start on low dose colchicine given his renal function, this need to be monitored closely given his renal function , can't give  any NSAIDs especially with his renal function. E- Sarcoidisis With Lofgren's syndrome unlikely as no evidence of talar lymphadenopathy on CT chest .    Acute on  chronic respiratory failure with hypoxia  -Resolved, initially on BiPAP, currently back on Coumadin or  Acute renal failure on CKD stage III  - Nephrology consult appreciated. -Baseline crt 2.5-2.8 - overall renal fxn has worsened significantly during this hospital stay, went as high as 5.7, but his crt is presently slowly improving - Nephrology following, avoid nephrotoxic medication.  Hyponatremia  - improving, continue to monitormonitor  Hyperkalemia - Resolved  Cardiomyopathy / 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 CHF -care as per CHF Team - appears well compensated at present    Hypertension -BP currently stable, but climbing ,  continue to monitor   Diabetes mellitus type 2 uncontrolled -A1c 7.5on insulin sliding scale and Lantus .  Urinary retention  -empiric tx for presumed BPH , has a Foley catheter, consider voiding trial when more stable.   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 -LVEF= 40% to 45%. akinesis of inferolateral and inferior myocardium -grade 2 diastolic dysfunction - Left atrium: severely dilated - Pericardium, extracardiac: A small pericardial effusion was identified 2/29 TEE - No TEE evidence of endocarditis - EF = 40-45%  Antibiotics: Azithromycin 2/23 > 2/24 Vancomycin 2/23 > 2/24 Doxy 3/1 > 3/4     Significant Tests:  See full reports for all details    Ct Abdomen Pelvis Wo Contrast  06/11/2014   CLINICAL DATA:  Initial evaluation for a generalized pain, weakness, weight loss of 15 lb, past medical  history of diabetes and hypertension as well as gout and heart failure  EXAM: CT CHEST, ABDOMEN AND PELVIS WITHOUT CONTRAST  TECHNIQUE: Multidetector CT imaging of the chest, abdomen and pelvis was performed following the standard protocol without IV contrast.  COMPARISON:  08/13/2013  FINDINGS: CT CHEST FINDINGS  Mild bilateral atelectasis. No significant parenchymal opacities otherwise.  Thyroid normal. Thoracic inlet normal. Calcification of the thoracic aorta. Moderate to severe coronary artery calcification. No significant pleural or pericardial effusion. No significant hilar or mediastinal adenopathy. 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  Stable latissimus dorsi 5 cm lipoma. No acute thoracic musculoskeletal findings.  CT ABDOMEN AND PELVIS FINDINGS  Liver is normal. Gallbladder is surgically absent. Spleen is normal. There is a stable 2 cm splenule in the hilum. Pancreas is normal.  Adrenal glands are normal. Significant perinephric inflammatory change bilaterally similar to prior study likely chronic. Bilateral renal cortical atrophy stable. 1 mm stone lower pole left kidney. Low attenuation 1.5 lesion lower pole left kidney not characterized without contrast but stable. 15 mm upper pole low-attenuation right renal lesions stable. 1 mm stone lower pole right kidney.  Increased attenuation in the bilateral flank subcutaneous soft tissues extending inferiorly into the pelvis. Foley catheter decompresses the bladder. Nonobstructive bowel gas pattern. Significant diverticulosis involving the descending and sigmoid colon without evidence of diverticulitis. Fecal retention throughout the colon noted. Appendix is normal. Small bowel is normal. Stomach is normal.  No acute musculoskeletal findings in the abdomen or pelvis. Extensive calcification of the abdominal aorta without dilatation. Bilateral fat containing inguinal hernias. Trace  free fluid layering dependently in the pelvis.  IMPRESSION: 1. No acute findings in the thorax 2. Bilateral subcutaneous flank edema possibly related to anasarca. Trace free fluid layering dependently in the pelvis appear 3. Stable indeterminate renal lesions, likely cysts although not characterized without contrast fully. 4. Diverticulosis.  Fecal retention.   Electronically Signed   By: Skipper Cliche M.D.   On: 06/11/2014 15:54   Dg Knee 1-2 Views Left  06/10/2014   CLINICAL DATA:  Left medial knee pain.  Trouble walking.  EXAM: LEFT KNEE - 1-2 VIEW  COMPARISON:  None.  FINDINGS: There is no evidence of fracture, dislocation, or joint effusion. There is no lytic or sclerotic osseous lesion. 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 There is mild patellofemoral osteoarthritis. There is peripheral vascular atherosclerotic disease. Soft tissues are unremarkable.  IMPRESSION: No acute osseous injury of the left knee.   Electronically Signed   By: Kathreen Devoid   On: 06/10/2014 18:37   Dg Knee 1-2 Views Right  06/10/2014   CLINICAL DATA:  Severe lower body pain. No known injury. Bilateral hip pain. Trouble walking. Right lateral knee pain.  Left medial knee pain.  EXAM: RIGHT KNEE - 1-2 VIEW  COMPARISON:  None.  FINDINGS: No acute fracture or dislocation. Moderate joint effusion. Tricompartmental osteoarthritis of the right knee most severe in the medial femorotibial compartment. Chondrocalcinosis of the lateral femorotibial compartment as can be seen with CPPD. There is peripheral vascular atherosclerotic disease.  IMPRESSION: No acute osseous injury of the right knee.  Tricompartmental osteoarthritis of the right knee.   Electronically Signed   By: Kathreen Devoid   On: 06/10/2014 18:27   Dg Ankle Complete Left  06/10/2014   CLINICAL DATA:  Lower body pain.  Trouble walking.  EXAM: LEFT ANKLE COMPLETE - 3+ VIEW  COMPARISON:  None.  FINDINGS: There is no  evidence of fracture, dislocation, or joint effusion. The ankle mortise is intact. There is a plantar calcaneal spur. There are enthesopathic changes at the Achilles tendon insertion. There are degenerative changes of the talonavicular joint. There is mild osteoarthritis of the tibiotalar joint. There is mild osteoarthritis of the first TMT joint. There is peripheral vascular atherosclerotic disease. Soft tissues are unremarkable.  IMPRESSION: 1. No acute osseous injury of the left ankle.   Electronically Signed   By: Kathreen Devoid   On: 06/10/2014 18:35   Dg Ankle Complete Right  06/10/2014   CLINICAL DATA:  Lower body pain.  Trouble walking.  EXAM: RIGHT ANKLE - COMPLETE 3+ VIEW  COMPARISON:  None.  FINDINGS: There is no evidence of fracture, dislocation, or joint effusion. There is a distal fibular side plate with multiple interlocking screws without failure or complication. There is a medial malleolar screw and a medial malleolar K-wire without failure or complication. The ankle mortise is intact. There is a plantar calcaneal spur. There are enthesopathic changes at the Achilles tendon insertion. There are degenerative changes of the talonavicular joint. There is osteoarthritis of the tibiotalar joint. Soft tissues are unremarkable.  IMPRESSION: 1. No acute osseous injury of the right ankle. 2. 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   Electronically Signed   By: Kathreen Devoid   On: 06/10/2014 18:34   Ct Chest Wo Contrast  06/11/2014   CLINICAL DATA:  Initial evaluation for a generalized pain, weakness, weight loss of 15 lb, past medical history of diabetes and hypertension as well as gout and heart failure  EXAM: CT CHEST, ABDOMEN AND PELVIS WITHOUT CONTRAST  TECHNIQUE: Multidetector CT imaging of the chest, abdomen and pelvis was performed following the standard protocol without IV contrast.  COMPARISON:  08/13/2013  FINDINGS: CT CHEST FINDINGS  Mild bilateral atelectasis. No  significant parenchymal opacities otherwise.  Thyroid normal. Thoracic inlet normal. Calcification of the thoracic aorta. Moderate to severe coronary artery calcification. No significant pleural or pericardial effusion. No significant hilar or mediastinal adenopathy. 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  Stable latissimus dorsi 5 cm lipoma. No acute thoracic musculoskeletal findings.  CT ABDOMEN AND PELVIS FINDINGS  Liver is normal. Gallbladder is surgically absent. Spleen is normal. There is a stable 2 cm splenule in the hilum. Pancreas is normal.  Adrenal glands are normal. Significant perinephric inflammatory change bilaterally similar to prior study likely chronic. Bilateral renal cortical atrophy stable. 1 mm stone lower pole left kidney. Low attenuation 1.5 lesion lower pole left kidney not characterized without contrast but stable. 15 mm upper pole low-attenuation right renal lesions stable. 1 mm stone lower pole right kidney.  Increased attenuation in the bilateral flank subcutaneous soft  tissues extending inferiorly into the pelvis. Foley catheter decompresses the bladder. Nonobstructive bowel gas pattern. Significant diverticulosis involving the descending and sigmoid colon without evidence of diverticulitis. Fecal retention throughout the colon noted. Appendix is normal. Small bowel is normal. Stomach is normal.  No acute musculoskeletal findings in the abdomen or pelvis. Extensive calcification of the abdominal aorta without dilatation. Bilateral fat containing inguinal hernias. Trace free fluid layering dependently in the pelvis.  IMPRESSION: 1. No acute findings in the thorax 2. Bilateral subcutaneous flank edema possibly related to anasarca. Trace free fluid layering dependently in the pelvis appear 3. Stable indeterminate renal lesions, likely cysts although not characterized without contrast fully. 4. Diverticulosis.   Fecal retention.   Electronically Signed   By: Skipper Cliche M.D.   On: 06/11/2014 15:54   Mr Lumbar Spine Wo Contrast  06/09/2014   CLINICAL DATA:  Focal lower extremity weakness. History of generalized weakness. No acute back pain, bowel or bladder symptoms. Initial encounter.  EXAM: MRI LUMBAR SPINE WITHOUT CONTRAST  TECHNIQUE: Multiplanar, multisequence MR imaging of the lumbar spine was performed. No intravenous contrast was administered.  COMPARISON:  Abdominal pelvic CT 08/13/2013.  FINDINGS: The lumbar spine examination is mildly motion degraded, especially on the axial images. CT demonstrates 5 lumbar type vertebral bodies. The alignment is stable and near anatomic. The marrow signal within the spine is mildly heterogeneous without focally suspicious lesion. There are foci of decreased T1 and T2 signal in the left iliac bone without clear corresponding finding on prior CT.  The conus medullaris extends to the T12-L1 level and appears normal. No focal paraspinal abnormalities are identified. There is subcutaneous edema in the back.  No significant disc space findings are seen from T10-11 through L2-3. There are paraspinal osteophytes anteriorly in the lower thoracic spine.  L3-4: Mild annular disc bulging. There is facet and ligamentous hypertrophy with bilateral facet joint effusions. These factors contribute to mild spinal stenosis with mild narrowing of both lateral recesses.  L4-5: Mild annular disc bulging. There is moderate facet and ligamentous hypertrophy with bilateral facet joint effusions. These factors contribute to moderate spinal stenosis with asymmetric narrowing of the right lateral recess and right foramen. There is mild subchondral edema within the facet joints without bone destruction or periarticular fluid collection.  L5-S1: Disc height and hydration are maintained. Mild bilateral facet hypertrophy. No spinal stenosis or nerve root encroachment.  IMPRESSION: 1. Moderate facet and  ligamentous hypertrophy with facet joint effusions and subchondral edema bilaterally at L3-4 and L4-5. 2. Associated annular disc bulging at both levels, contributing to mild spinal stenosis at L3-4 and moderate spinal stenosis at L4-5. 3. Nonspecific marrow lesions within the left iliac bone. Early metastatic disease cannot be completely excluded. No suspicious lesions demonstrated within the lumbar spine.   Electronically Signed   By: Richardean Sale M.D.   On: 06/09/2014 17:06   Nm Bone Scan Whole Body  06/10/2014   CLINICAL DATA:  Lower extremity weakness and pain. History of diabetes, hypertension and stroke. Indeterminate lesions in left iliac bone on MRI. No given history of malignancy.  EXAM: NUCLEAR MEDICINE WHOLE BODY BONE SCAN  TECHNIQUE: Whole body anterior and posterior images were obtained approximately 3 hours after intravenous injection of radiopharmaceutical.  RADIOPHARMACEUTICALS:  25.0 mCi Technetium-99 MDP  COMPARISON:  Lumbar spine and pelvic MRI 06/09/2014.  FINDINGS: There is no osseous activity suspicious for metastatic disease. Specifically, no abnormal activity is seen within the left iliac bone or remainder of the pelvis.  There is scattered arthropathic activity within the large joints, primarily the shoulders, elbows and knees. There is also some arthropathic activity throughout the hands and feet bilaterally. Activity is present within the Foley catheter bag between lower legs.  IMPRESSION: No bone scan evidence of osseous metastatic disease. Scattered arthropathic activity.   Electronically Signed   By: Richardean Sale M.D.   On: 06/10/2014 15:42   US Renal  05/31/2014   CLINICAL DATA:  Hypertension.  History of diabetes.  EXAM: RENAL/URINARY TRACT ULTRASOUND COMPLETE  COMPARISON:  None.  FINDINGS: Right Kidney:  Length: 12.4 cm. Borderline increased parenchymal echogenicity and mild diffuse cortical thinning. Normal overall renal parenchymal echogenicity. Several small cysts,  largest arising from the cortex of the upper pole measuring 2 cm. No solid renal masses. No hydronephrosis.  Left Kidney:  Length: 12.7 cm. Lobulated contour with mild diffuse cortical thinning. Normal parenchymal echogenicity. 2 cm lower pole cortical cyst. Smaller cysts lies adjacent to this measuring 13 mm. No solid renal masses. No hydronephrosis.  Bladder:  Appears normal for degree of bladder distention.  IMPRESSION: 1. No hydronephrosis. 2. Small bilateral renal cysts. Borderline increased parenchymal echogenicity from the right kidney. Bilateral, left greater than right, renal cortical thinning.   Electronically Signed   By: Lajean Manes M.D.   On: 05/31/2014 17:32   Mr Sacrum/si Joints Wo Contrast  06/09/2014   CLINICAL DATA:  Focal lower extremity weakness. History of generalized weakness. No acute back pain, bowel or bladder symptoms. Initial encounter.  EXAM: MR SACRUM WITHOUT CONTRAST  TECHNIQUE: Multiplanar, multisequence MR imaging was performed. No intravenous contrast was administered.  COMPARISON:  Pelvic CT 08/13/2013.  FINDINGS: Lumbar spine findings dictated separately.  No evidence of sacral fracture or destruction. The sacroiliac joints are intact without erosive change. There is no presacral fluid collection. There is no sacral nerve foraminal compromise or nerve root encroachment.  There is mildly heterogeneous marrow signal within the sacrum and iliac bones on the T1 weighted images. No suspicious lesions demonstrated on T2 or inversion recovery imaging.  Foley catheter is in place. The bladder is decompressed and demonstrates mild wall thickening. There is nodularity throughout the peripheral and central portions of the prostate gland. Asymmetric fat in the right inguinal canal is incompletely visualized, although grossly stable. There is nonspecific subcutaneous edema dependently in the buttocks as well as mild perirectal edema.  IMPRESSION: 1. Negative MRI of the sacrum and sacroiliac  joints. 2. No evidence of sacral nerve root encroachment. 3. Lumbar spine findings dictated separately.   Electronically Signed   By: Richardean Sale M.D.   On: 06/09/2014 16:54   Nm Myocar Multi W/spect W/wall Motion / Ef  06/02/2014   CLINICAL DATA:  Mr Baillie is a 74 year old with a history of diabetes, CHF (EF unknown), CKD creatnine baseline 2.5, and CVA 2009-2010.  EXAM: MYOCARDIAL IMAGING WITH SPECT (PHARMACOLOGIC-STRESS)  GATED LEFT VENTRICULAR WALL MOTION STUDY  LEFT VENTRICULAR EJECTION FRACTION  TECHNIQUE: Intravenous infusion of Lexiscan was performed under the supervision of the Cardiology staff. At peak effect of the drug, 10 mCi Tc-52msestamibi was injected intravenously and standard myocardial SPECT imaging was performed. Quantitative gated imaging was also performed to evaluate left ventricular wall motion, and estimate left ventricular ejection fraction.  COMPARISON:  None.  FINDINGS: Perfusion: There is a large and severe inferior and inferoseptal perfusion defect with minimal reversibility around the defect edges. This is consistent with a large infarction in the right coronary artery distribution with  mild peri-infarct ischemia.  Wall Motion: Mild global hypokinesis with inferior wall akinesis. Moderate left ventricular dilation.  Left Ventricular Ejection Fraction: 34 %  End diastolic volume 067 ml  End systolic volume 703 ml  IMPRESSION: 1. Scar with mild peri-infarct ischemia consistent with infarction in the entire RCA distribution  2.  Inferior wall akinesis.  3. Left ventricular ejection fraction 34%  4. High-risk stress test findings*. Minimal reversible ischemia. Moderately severe ischemic cardiomyopathy  *2012 Appropriate Use Criteria for Coronary Revascularization Focused Update: J Am Coll Cardiol. 4035;24(8):185-909. http://content.airportbarriers.com.aspx?articleid=[email or web address]   Electronically Signed   By: Sanda Klein   On: 06/02/2014 13:42   Nm Pulmonary Perf And  Vent  06/05/2014   CLINICAL DATA:  Dyspnea. History of gout, diabetes, hypertension, stroke, heart failure.  EXAM: NUCLEAR MEDICINE VENTILATION - PERFUSION LUNG SCAN  TECHNIQUE: Ventilation images were obtained in multiple projections using inhaled aerosol technetium 99 M DTPA. Perfusion images were obtained in multiple projections after intravenous injection of Tc-46mMAA.  RADIOPHARMACEUTICALS:  40.0 mCi Tc-971mTPA aerosol and 6.0 mCi Tc-9983mA  COMPARISON:  Chest x-ray 06/03/2014  FINDINGS: Ventilation: No focal ventilation defect.  Perfusion: No wedge shaped peripheral perfusion defects to suggest acute pulmonary embolism.  IMPRESSION: Normal ventilation perfusion scan.   Electronically Signed   By: EliNolon NationsD.   On: 06/05/2014 14:52   Dg Chest Port 1 View  06/06/2014   CLINICAL DATA:  [age]ar old male with congestive heart failure  EXAM: PORTABLE CHEST - 1 VIEW  COMPARISON:  Prior chest x-ray 06/03/2014  FINDINGS: Stable cardiomegaly with left heart prominence. Decreased interstitial pulmonary edema but persistent pulmonary vascular congestion. Left retrocardiac opacity remains unchanged. No large pleural effusion and no evidence of pneumothorax. No acute osseous abnormality.  IMPRESSION: 1. Resolving pulmonary interstitial edema with persistent vascular congestion. 2. Unchanged left retrocardiac opacity favored to reflect a combination of atelectasis and perhaps pleural effusion.   Electronically Signed   By: HeaJacqulynn CadetD.   On: 06/06/2014 12:06   Dg Chest Port 1 View  06/03/2014   CLINICAL DATA:  Shortness of breath, flu symptoms  EXAM: PORTABLE CHEST - 1 VIEW  COMPARISON:  Portable chest x-ray of May 31, 2014  FINDINGS: The lungs are adequately inflated. The interstitial markings are less congested today. The cardiac silhouette remains enlarged. The pulmonary vascularity remains engorged but there is less cephalization of the vascular pattern. The retrocardiac region on the  left is less dense.  IMPRESSION: Findings consistent with mild improvement in pulmonary interstitial edema secondary to CHF. When the patient can tolerate the procedure, a PA and lateral chest x-ray would be useful.   Electronically Signed   By: David  JorMartiniqueOn: 06/03/2014 07:30   Dg Chest Port 1 View  05/31/2014   CLINICAL DATA:  Shortness of breath and congestion.  History of CHF.  EXAM: PORTABLE CHEST - 1 VIEW  COMPARISON:  None.  FINDINGS: Trachea is midline. Heart is enlarged. There is perihilar and basilar predominant mixed interstitial and airspace opacification. Probable bilateral effusions.  Degenerative changes are seen in the shoulders.  IMPRESSION: Congestive heart failure.   Electronically Signed   By: MelLorin PicketD.   On: 05/31/2014 18:29   Dg Hips Bilat With Pelvis 3-4 Views  06/10/2014   CLINICAL DATA:  Lower body pain. Trouble walking. Bilateral hip pain.  EXAM: BILATERAL HIP (WITH PELVIS) 3-4 VIEWS  COMPARISON:  None.  FINDINGS: No fracture or dislocation. Joint spaces are maintained. No  lytic or sclerotic osseous lesion. Peripheral vascular atherosclerotic disease.  IMPRESSION: No acute osseous injury of bilateral hips.   Electronically Signed   By: Kathreen Devoid   On: 06/10/2014 18:38     Today   Subjective:   Juleen Starr today has no headache,no chest abdominal pain, reports pain in bilateral knees and ankles, reports it's improving. Objective:   Blood pressure 130/50, pulse 61, temperature 98.2 F (36.8 C), temperature source Oral, resp. rate 18, height 5' 8"  (1.727 m), weight 103.874 kg (229 lb), SpO2 98 %.  Intake/Output Summary (Last 24 hours) at 06/11/14 1633 Last data filed at 06/11/14 1106  Gross per 24 hour  Intake    553 ml  Output   2225 ml  Net  -1672 ml    Exam General: alert and conversant - no acute resp distress  Lungs: Clear to auscultation bilaterally without wheezes or crackles Cardiovascular: Regular rate and rhythm without murmur gallop  or rub  Abdomen: Nontender, nondistended, soft, bowel sounds positive, no rebound, no ascites, no appreciable mass Extremities: No significant cyanosis, clubbing, or edema bilateral lower extremities - no joint erythema or effusions noted B LE  Neuro: 4/5 strength B LE - intact sensation touch th/o , movement limited due to pain  Data Review   Cultures -  Results for orders placed or performed during the hospital encounter of 05/31/14  MRSA PCR Screening     Status: None   Collection Time: 05/31/14 10:55 AM  Result Value Ref Range Status   MRSA by PCR NEGATIVE NEGATIVE Final    Comment:        The GeneXpert MRSA Assay (FDA approved for NASAL specimens only), is one component of a comprehensive MRSA colonization surveillance program. It is not intended to diagnose MRSA infection nor to guide or monitor treatment for MRSA infections.   Respiratory virus panel (routine influenza)     Status: None   Collection Time: 06/01/14  9:01 PM  Result Value Ref Range Status   Source - RVPAN NASAL SWAB  Corrected   Respiratory Syncytial Virus A Negative Negative Final   Respiratory Syncytial Virus B Negative Negative Final   Influenza A Negative Negative Final   Influenza B Negative Negative Final   Parainfluenza 1 Negative Negative Final   Parainfluenza 2 Negative Negative Final   Parainfluenza 3 Negative Negative Final   Metapneumovirus Negative Negative Final   Rhinovirus Negative Negative Final   Adenovirus Negative Negative Final    Comment: (NOTE) Performed At: [facility] [address] [place], [place] [id number] Lindon Romp MD [id number]   Culture, blood (routine x 2)     Status: None   Collection Time: 06/01/14 10:33 PM  Result Value Ref Range Status   Specimen Description BLOOD RIGHT HAND  Final   Special Requests BOTTLES DRAWN AEROBIC ONLY 5CC  Final   Culture   Final    NO GROWTH 5 DAYS Performed at [facility]    Report Status 06/08/2014  FINAL  Final  Culture, blood (routine x 2)     Status: None   Collection Time: 06/01/14 10:45 PM  Result Value Ref Range Status   Specimen Description BLOOD LEFT HAND  Final   Special Requests BOTTLES DRAWN AEROBIC ONLY 4CC  Final   Culture   Final    NO GROWTH 5 DAYS Performed at [facility]    Report Status 06/08/2014 FINAL  Final     CBC w Diff:  Lab Results  Component Value Date   WBC 9.1 06/11/2014   HGB 11.8* 06/11/2014   HCT 34.0* 06/11/2014   PLT 330 06/11/2014   LYMPHOPCT 6* 06/07/2014   MONOPCT 9 06/07/2014   EOSPCT 0 06/07/2014   BASOPCT 0 06/07/2014   CMP:  Lab Results  Component Value Date   NA 134* 06/11/2014   K 4.7 06/11/2014   CL 101 06/11/2014   CO2 25 06/11/2014   BUN 138* 06/11/2014   CREATININE 3.36* 06/11/2014   PROT 5.5* 06/10/2014   ALBUMIN 2.3* 06/11/2014   BILITOT 0.6 06/10/2014   ALKPHOS 72 06/10/2014   AST 21 06/10/2014   ALT 32 06/10/2014  .  Micro Results Recent Results (from the past 240 hour(s))  Respiratory virus panel (routine influenza)     Status: None   Collection Time: 06/01/14  9:01 PM  Result Value Ref Range Status   Source - RVPAN NASAL SWAB  Corrected   Respiratory Syncytial Virus A Negative Negative Final   Respiratory Syncytial Virus B Negative Negative Final   Influenza A Negative Negative Final   Influenza B Negative Negative Final   Parainfluenza 1 Negative Negative Final   Parainfluenza 2 Negative Negative Final   Parainfluenza 3 Negative Negative Final   Metapneumovirus Negative Negative Final   Rhinovirus Negative Negative Final   Adenovirus Negative Negative Final    Comment: (NOTE) Performed At: [facility] [place], [place] [id number] Lindon Romp MD GP:[id number]   Culture, blood (routine x 2)     Status: None   Collection Time: 06/01/14 10:33 PM  Result Value Ref Range Status   Specimen Description BLOOD RIGHT HAND  Final   Special Requests BOTTLES DRAWN  AEROBIC ONLY 5CC  Final   Culture   Final    NO GROWTH 5 DAYS Performed at [facility]    Report Status 06/08/2014 FINAL  Final  Culture, blood (routine x 2)     Status: None   Collection Time: 06/01/14 10:45 PM  Result Value Ref Range Status   Specimen Description BLOOD LEFT HAND  Final   Special Requests BOTTLES DRAWN AEROBIC ONLY 4CC  Final   Culture   Final    NO GROWTH 5 DAYS Performed at [facility]    Report Status 06/08/2014 FINAL  Final     Discharge Instructions          Follow-up Information    Follow up with [name], MD.   Specialty:  Family Medicine   Why:  Patient to make appointment after being discharged from [facility] information:   [place] [address] [phone]       Discharge Medications     Medication List    STOP taking these medications        atorvastatin 20 MG tablet  Commonly known as:  LIPITOR     furosemide 80 MG tablet  Commonly known as:  LASIX     HYDROcodone-acetaminophen 7.5-750 MG per tablet  Commonly known as:  VICODIN ES  Replaced by:  HYDROcodone-acetaminophen 5-325 MG per tablet     losartan 50 MG tablet  Commonly known as:  COZAAR     metoprolol succinate 25 MG 24 hr tablet  Commonly known as:  TOPROL-XL     PROAIR HFA 108 (90 BASE) MCG/ACT inhaler  Generic drug:  albuterol     sitaGLIPtin 50 MG tablet  Commonly known as:  JANUVIA     vitamin  E 200 UNIT capsule      TAKE these medications        allopurinol 100 MG tablet  Commonly known as:  ZYLOPRIM  Take 1 tablet (100 mg total) by mouth daily.     amLODipine 5 MG tablet  Commonly known as:  NORVASC  Take 5 mg by mouth 2 (two) times daily.     colchicine 0.6 MG tablet  Take 0.5 tablets (0.3 mg total) by mouth daily.  Start taking on:  06/12/2014     diphenhydrAMINE 25 mg capsule  Commonly known as:  BENADRYL  Take 1 capsule (25 mg total) by mouth every 6 (six) hours as needed for itching.      docusate sodium 100 MG capsule  Commonly known as:  COLACE  Take 100 mg by mouth at bedtime.     HYDROcodone-acetaminophen 5-325 MG per tablet  Commonly known as:  NORCO/VICODIN  Take 1-2 tablets by mouth every 4 (four) hours as needed for moderate pain.     insulin aspart 100 UNIT/ML injection  Commonly known as:  novoLOG  Inject 0-20 Units into the skin 3 (three) times daily with meals.     insulin glargine 100 UNIT/ML injection  Commonly known as:  LANTUS  Inject 0.18 mLs (18 Units total) into the skin daily.     ipratropium-albuterol 0.5-2.5 (3) MG/3ML Soln  Commonly known as:  DUONEB  Take 3 mLs by nebulization every 6 (six) hours as needed.     isosorbide mononitrate 30 MG 24 hr tablet  Commonly known as:  IMDUR  Take 0.5 tablets (15 mg total) by mouth daily.     LYRICA 75 MG capsule  Generic drug:  pregabalin  Take 75 mg by mouth 2 (two) times daily.     METAMUCIL PO  Take by mouth at bedtime.     multivitamin tablet  Take 1 tablet by mouth daily.     PLAVIX 75 MG tablet  Generic drug:  clopidogrel  Take 75 mg by mouth daily with breakfast.     predniSONE 20 MG tablet  Commonly known as:  DELTASONE  Take 2 tablets (40 mg total) by mouth daily with breakfast.  Start taking on:  06/12/2014     predniSONE 10 MG tablet  Commonly known as:  DELTASONE  Take 3 tablets (30 mg total) by mouth daily with breakfast.  Start taking on:  06/13/2014     predniSONE 20 MG tablet  Commonly known as:  DELTASONE  Take 1 tablet (20 mg total) by mouth daily with breakfast.  Start taking on:  06/14/2014     predniSONE 10 MG tablet  Commonly known as:  DELTASONE  Take 1 tablet (10 mg total) by mouth daily with breakfast.  Start taking on:  06/15/2014     sertraline 50 MG tablet  Commonly known as:  ZOLOFT  Take 50 mg by mouth at bedtime.     Vitamin D3 1000 UNITS Caps  Take 1,000 Units by mouth daily.         Total Time in preparing paper work, data evaluation and todays  exam - 35 minutes  ELGERGAWY, DAWOOD M.D on 06/11/2014 at 4:33 PM  [employer]  [phone]

## 2014-06-11 NOTE — Progress Notes (Signed)
[name], RN Rehab Admission Coordinator Signed Physical Medicine and Rehabilitation PMR Pre-admission 06/10/2014 3:51 PM  Related encounter: Admission (Current) from 05/31/2014 in [facility]    Expand All Collapse All   PMR Admission Coordinator Pre-Admission Assessment  Patient: Darrell Richardson is an 74 y.o., male MRN: [id number] DOB: 06-Jun-1940 Height: 5' 8"  (172.7 cm) Weight: 103.874 kg (229 lb)  Insurance Information HMO: PPO: PCP: IPA: 80/20: yes OTHER: no HMO PRIMARY: Medicare a and b Policy#: [id number] Subscriber: pt Benefits: Phone #: palmetto online Name: 06/10/14 Eff. Date: 07/08/05 Deduct: $1288 Out of Pocket Max: none Life Max: none CIR: 100% SNF: 20 full days Outpatient: 80% Co-Pay: 20% Home Health: 100% Co-Pay: none DME: 80% Co-Pay: 20% Providers: pt choice  SECONDARY: BCBS of  supplement Policy#: [id number] Subscriber: pt  Medicaid Application Date: Case Manager:  Disability Application Date: Case Worker:   Emergency Contact Information Contact Information    Name Relation Home Work [place] B  [phone]     [place], [place]   [phone]   [place] Daughter   [phone]     Current Medical History  Patient Admitting Diagnosis: debility after CHF/respiratory failure. R/o polyarticular gout  History of Present Illness: LUCIEN Richardson is a 74 y.o. right handed male with history of CVA with residual left hemiparesis, diabetes mellitus peripheral neuropathy, gout, diastolic congestive heart failure, chronic renal insufficiency 2.44. Lives with his wife and used a cane prior to admission.  Presented to [facility] 05/31/2014 with  increasing nonspecific chest pain and shortness of breath and transferred to [facility] for further medical evaluation. Complaints of increased shortness of breath and lower extremity edema. He was found to have acute respiratory failure secondary to acute CHF exacerbation placed on BiPAP. Troponin was negative. BNP 7363 Patient placed on Lasix. Echocardiogram with ejection fraction of 45% with akinesis of the inferior lateral and inferior myocardium.  Progressive increase in creatinine function from baseline 2.4-3.9. Renal service is consulting. Renal ultrasound showed no hydronephrosis. Known Chronic kidney disease at baseline.   Complaints of joint pain low-grade fever with ESR 135 as well as uric acid level 8.3. Patient had been placed on prednisone therapy with workup concerning for PMR versus RA. It was felt prednisone was inducing increased renal function. TEE completed 06/07/2014 showing no vegetation moderate sized PFO with left-to-right shunting at rest. Began IV solumedrol. Felt overall feeling of unwellness may be due to BUN 143 and due to gout. Neurology consulted 3/3 and felt lower extremity weakness secondary to disuse and scans of lumbar spine and sacroliliac showing radiculopathy. Pt placed in IV solumedrol. Dr. Haroldine Laws has spoke with Dr. Amil Amen in Rheumatolgy who is reviewing pt's case in light of lack of clinical response to steroids and concerns for underlying malignancy.   Palliaative team consulted 3/3 for goals of care. He requested no intubation. Wanted better pain management, to pursue inpt rehab admission and discussing if he would ever want dialysis acutely or longterm or not.   Past Medical History  Past Medical History  Diagnosis Date  . Hearing loss   . Gout   . Diabetes   . Fatigue   . Hypertension   . Stroke     X 2  . Heart failure   . Muscle pain   . Hammertoe     Family History  family history includes Dementia  in his mother.  Prior Rehab/Hospitalizations: HH or OP only after CVA 2009 and  2010  Current Medications   Current facility-administered medications:  . acetaminophen (TYLENOL) tablet 650 mg, 650 mg, Oral, Q6H PRN, 325 mg at 06/04/14 0114 **OR** acetaminophen (TYLENOL) suppository 650 mg, 650 mg, Rectal, Q6H PRN, Verlee Monte, MD . antiseptic oral rinse (CPC / CETYLPYRIDINIUM CHLORIDE 0.05%) solution 7 mL, 7 mL, Mouth Rinse, BID, Verlee Monte, MD, 7 mL at 06/10/14 2218 . clopidogrel (PLAVIX) tablet 75 mg, 75 mg, Oral, Daily, Verlee Monte, MD, 75 mg at 06/10/14 1032 . diphenhydrAMINE (BENADRYL) capsule 25 mg, 25 mg, Oral, Q6H PRN, Amy D Clegg, NP, 25 mg at 06/01/14 1109 . doxycycline (VIBRA-TABS) tablet 100 mg, 100 mg, Oral, Q12H, Jolaine Artist, MD, 100 mg at 06/10/14 2218 . heparin injection 5,000 Units, 5,000 Units, Subcutaneous, 3 times per day, Verlee Monte, MD, 5,000 Units at 06/11/14 1779 . hydrALAZINE (APRESOLINE) tablet 5 mg, 5 mg, Oral, BID, Allie Bossier, MD, 5 mg at 06/10/14 2217 . HYDROcodone-acetaminophen (NORCO/VICODIN) 5-325 MG per tablet 1-2 tablet, 1-2 tablet, Oral, Q4H PRN, Verlee Monte, MD, 1 tablet at 06/11/14 0900 . hydrocortisone cream 1 %, , Topical, QID PRN, Amy D Clegg, NP . insulin aspart (novoLOG) injection 0-20 Units, 0-20 Units, Subcutaneous, TID WC, Cherene Altes, MD, 3 Units at 06/11/14 [phone] . insulin glargine (LANTUS) injection 18 Units, 18 Units, Subcutaneous, Daily, Cherene Altes, MD, 18 Units at 06/10/14 1033 . ipratropium-albuterol (DUONEB) 0.5-2.5 (3) MG/3ML nebulizer solution 3 mL, 3 mL, Nebulization, Q6H PRN, Allie Bossier, MD . isosorbide mononitrate (IMDUR) 24 hr tablet 15 mg, 15 mg, Oral, Daily, Amy D Clegg, NP, 15 mg at 06/10/14 1050 . methylPREDNISolone sodium succinate (SOLU-MEDROL) 125 mg/2 mL injection 125 mg, 125 mg, Intravenous, Q24H, Dawood Elgergawy, MD . morphine 2 MG/ML injection 1 mg, 1 mg, Intravenous, Q4H PRN,  Verlee Monte, MD, 1 mg at 06/10/14 1643 . ondansetron (ZOFRAN) injection 4 mg, 4 mg, Intravenous, Q6H PRN, Jolaine Artist, MD, 4 mg at 06/04/14 0103 . oxymetazoline (AFRIN) 0.05 % nasal spray 1 spray, 1 spray, Each Nare, BID PRN, Jolaine Artist, MD . pregabalin (LYRICA) capsule 75 mg, 75 mg, Oral, Daily, Jolaine Artist, MD, 75 mg at 06/10/14 1036 . psyllium (HYDROCIL/METAMUCIL) packet 1 packet, 1 packet, Oral, QHS, Jolaine Artist, MD, 1 packet at 06/09/14 2323 . sertraline (ZOLOFT) tablet 50 mg, 50 mg, Oral, QHS, Jolaine Artist, MD, 50 mg at 06/10/14 2218 . sodium chloride 0.9 % injection 3 mL, 3 mL, Intravenous, Q12H, Verlee Monte, MD, 3 mL at 06/10/14 2218  Patients Current Diet: Diet renal/carb modified with 1200 ml fluid restriction  Precautions / Restrictions Precautions Precautions: Fall Precaution Comments: significant pain above right knee and Bil ankles (possibly reminants of gout flare up) Restrictions Weight Bearing Restrictions: No   Prior Activity Level Pt used cane pta. Had walked to mail box without pain or difficulty pta. Had a ramp built at home for there were times that his knees would give him trouble to walk up steps at times pta. Did own adls.  Home Assistive Devices / Equipment Home Assistive Devices/Equipment: CBG Meter, Dentures (specify type), Raised toilet seat with rails, Walker (specify type) Home Equipment: Gilford Rile - 2 wheels, Walker - 4 wheels, Cane - quad, [employer] - single point, Civil engineer, contracting, Grab bars - tub/shower  Prior Functional Level Prior Function Level of Independence: Independent Comments: occassional use of cane/walker if bad day  Current Functional Level Cognition  Overall Cognitive Status: Within Functional Limits for tasks assessed Orientation Level: Oriented  X4   Extremity Assessment (includes Sensation/Coordination)  Upper Extremity Assessment: Overall WFL for tasks assessed  Lower Extremity Assessment: Defer  to PT evaluation LLE Deficits / Details: modest residual stroke deficits, 3-/5    ADLs  Overall ADL's : Needs assistance/impaired Eating/Feeding: Independent, Sitting Grooming: Set up, Sitting Upper Body Bathing: Sitting, Moderate assistance Upper Body Bathing Details (indicate cue type and reason): pt with difficulty maintaining sitting balance during UB ADL Lower Body Bathing: Total assistance, Bed level Upper Body Dressing : Moderate assistance, Sitting Upper Body Dressing Details (indicate cue type and reason): pt with difficulty maintaining sitting balance with UB dressing Lower Body Dressing: Total assistance, Bed level Toilet Transfer: Moderate assistance, +2 for physical assistance (bari stedy)    Mobility  Overal bed mobility: Needs Assistance, +2 for physical assistance Bed Mobility: Supine to Sit Supine to sit: Mod assist, HOB elevated General bed mobility comments: pt move Bil LEs to EOB first and then cued to reach for left upper rail with Bil UEs and try to scoot around more, but could not so A'd pt with bed pad at which point he c/o pf excruciating pain above right knee--I lifted RUE back up to full extension and held it there for approximately 30 seconds then was able to lower it back down and help pt sit up to EOB with A to elevate trunk and scoot to EOB with pad    Transfers  Overall transfer level: Needs assistance Equipment used: 2 person hand held assist Judie Petit stedy) Transfers: Sit to/from Stand (partial) Sit to Stand: +2 physical assistance, Total assist, From elevated surface Stand pivot transfers: +2 physical assistance, Total assist, From elevated surface General transfer comment: First attemtp pt not able to get up and forward enough to get seat of Jeralene Huff aroudn under him. On second attempt pt just barely got up and forward enough to clear buttocks enough from bed (raised) to get Jeralene Huff seat rotated around under him    Ambulation / Gait /  Stairs / Wheelchair Mobility  Ambulation/Gait General Gait Details: unable to perform    Posture / Balance Dynamic Sitting Balance Sitting balance - Comments: Pt sat EOB x 20 minutes. Initially able to sit statically with supervision. Increased assist needed with functional activities and fatigue. Balance Overall balance assessment: Needs assistance Sitting-balance support: Bilateral upper extremity supported, Feet supported Sitting balance-Leahy Scale: Poor Sitting balance - Comments: Pt sat EOB x 20 minutes. Initially able to sit statically with supervision. Increased assist needed with functional activities and fatigue. Postural control: Posterior lean Standing balance support: Bilateral upper extremity supported Standing balance-Leahy Scale: Zero Standing balance comment: Clarise Cruz Plus    Special needs/care consideration Bowel mgmt: continent last BM 3/2 Bladder mgmt: foley. Pt requesting removal asap Diabetic mgmt Hgb A1c 7.5 wife checks pt's cbgs at home and BP to manage his meds   Previous Home Environment Living Arrangements: Spouse/significant other Lives With: Spouse Available Help at Discharge: Family Type of Home: House Home Layout: One level Home Access: Ramped entrance Bathroom Shower/Tub: Multimedia programmer: Handicapped height Bathroom Accessibility: Yes How Accessible: Accessible via walker [place]: No  Discharge Living Setting Plans for Discharge Living Setting: Patient's home, Lives with (comment), Other (Comment) (wife) Type of Home at Discharge: House Discharge Home Layout: One level Discharge Home Access: [place] entrance Discharge Bathroom Shower/Tub: Walk-in shower Discharge Bathroom Toilet: Handicapped height Discharge Bathroom Accessibility: Yes How Accessible: Accessible via walker Does the patient have any problems obtaining your medications?: No  Social/Family/Support Systems Patient Roles: Spouse Contact  Information: Treg Diemer, wife Anticipated Caregiver: wife Anticipated Caregiver's Contact Information: see above Ability/Limitations of Caregiver: min assist level Caregiver Availability: 24/7 Discharge Plan Discussed with Primary Caregiver: Yes Is Caregiver In Agreement with Plan?: Yes Does Caregiver/Family have Issues with Lodging/Transportation while Pt is in Rehab?: No  Goals/Additional Needs Patient/Family Goal for Rehab: supervision to min assist with PT and OT Expected length of stay: ELOS 15-20 days Pt/Family Agrees to Admission and willing to participate: Yes Program Orientation Provided & Reviewed with Pt/Caregiver Including Roles & Responsibilities: Yes  Decrease burden of Care through IP rehab admission: n/a  Possible need for SNF placement upon discharge:not expected  Patient Condition: This patient's medical and functional status has changed since the consult dated: 06/08/2014 in which the Rehabilitation Physician determined and documented that the patient's condition is appropriate for intensive rehabilitative care in an inpatient rehabilitation facility. See "History of Present Illness" (above) for medical update. Functional changes are: overall max assist with transfers and adls. Patient's medical and functional status update has been discussed with the Rehabilitation physician and patient remains appropriate for inpatient rehabilitation. Will admit to inpatient rehab today.  Preadmission Screen Completed By: [name], 06/11/2014 9:55 AM ______________________________________________________________________  Discussed status with Dr. Naaman Plummer on 06/11/2014 at [phone] and received telephone approval for admission today.  Admission Coordinator: [name], time 5868 Date 06/11/2014          Cosigned by: Meredith Staggers, MD at 06/11/2014 10:14 AM  Revision History     Date/Time User Provider Type Action   06/11/2014 10:14 AM Meredith Staggers, MD  Physician Cosign   06/11/2014 9:56 AM [name], RN Rehab Admission Coordinator Sign   06/11/2014 9:55 AM [name], RN Rehab Admission Coordinator Sign   View Details Report

## 2014-06-11 NOTE — Progress Notes (Deleted)
Physical Medicine and Rehabilitation Consult Reason for Consult: Multi-medical/congestive heart failure/question RA versus PMR history of CVA Referring Physician: [facility]   HPI: Darrell Richardson is a 74 y.o. right handed male with history of CVA with residual left hemiparesis, diabetes mellitus peripheral neuropathy, gout, diastolic congestive heart failure, chronic renal insufficiency 2.44. Lives with his wife and used a cane prior to admission. Presented to [facility] 05/31/2014 with increasing nonspecific chest pain and shortness of breath and transferred to [facility] for further medical evaluation. Complaints of increased shortness of breath and lower extremity edema. He was found to have acute respiratory failure secondary to acute CHF exacerbation placed on BiPAP. Troponin was negative. BNP 7363 Patient placed on Lasix. Echocardiogram with ejection fraction of 45% with akinesis of the inferior lateral and inferior myocardium. Progressive increase in creatinine function from baseline 2.4-3.9. Renal service is consulted. Renal ultrasound showed no hydronephrosis. Complaints of joint pain low-grade fever with ESR 135 as well as uric acid level 8.3. Patient had been placed on prednisone therapy with workup concerning for PMR versus RA. It was felt prednisone was inducing increased renal function. TEE completed 06/07/2014 showing no vegetation moderate sized PFO with left-to-right shunting at rest. Full workups remain underway. Subcutaneous heparin for DVT prophylaxis. Physical therapy evaluation completed 06/05/2014 with recommendations of physical medicine rehabilitation consult.   Review of Systems  HENT: Positive for hearing loss.  Respiratory: Positive for shortness of breath.  Cardiovascular: Positive for leg swelling.  Gastrointestinal: Positive for constipation.  Musculoskeletal: Positive for myalgias and joint pain.  Psychiatric/Behavioral: Positive for depression.    All other systems reviewed and are negative.  Past Medical History  Diagnosis Date  . Hearing loss   . Gout   . Diabetes   . Fatigue   . Hypertension   . Stroke     X 2  . Heart failure   . Muscle pain   . Hammertoe    Past Surgical History  Procedure Laterality Date  . Stomach surgery    . Foot surgery Right    Family History  Problem Relation Age of Onset  . Dementia Mother    Social History:  reports that he has quit smoking. He does not have any smokeless tobacco history on file. He reports that he drinks alcohol. He reports that he does not use illicit drugs. Allergies:  Allergies  Allergen Reactions  . Cortisone Anaphylaxis and Other (See Comments)    Cold, elevated BP, shaking all over and fever  Wife says tolerates hydrocortisone cream at home  . Ambien [Zolpidem Tartrate]     Did not tolerate well or remembered anything from previous night  . Rocephin [Ceftriaxone] Hives  . Levaquin [Levofloxacin In D5w] Rash   Medications Prior to Admission  Medication Sig Dispense Refill  . albuterol (PROAIR HFA) 108 (90 BASE) MCG/ACT inhaler Inhale 2 puffs into the lungs every 4 (four) hours as needed for wheezing or shortness of breath. Inhale 1-2 puffs four times daily as needed    . allopurinol (ZYLOPRIM) 100 MG tablet Take 100 mg by mouth 2 (two) times daily.    Marland Kitchen amLODipine (NORVASC) 5 MG tablet Take 5 mg by mouth 2 (two) times daily.    Marland Kitchen atorvastatin (LIPITOR) 20 MG tablet Take 40 mg by mouth daily.     . Cholecalciferol (VITAMIN D3) 1000 UNITS CAPS Take 1,000 Units by mouth daily.    . clopidogrel (PLAVIX) 75 MG tablet Take 75 mg by mouth daily  with breakfast.    . docusate sodium (COLACE) 100 MG capsule Take 100 mg by mouth at bedtime.    . furosemide (LASIX) 80 MG tablet Take 80 mg by mouth daily.    Marland Kitchen HYDROcodone-acetaminophen (VICODIN ES)  7.5-750 MG per tablet Take 1 tablet by mouth every 6 (six) hours as needed for pain. Take 1/2 - 1 four times a day as needed    . losartan (COZAAR) 50 MG tablet Take 50 mg by mouth daily.    . metoprolol succinate (TOPROL-XL) 25 MG 24 hr tablet Take 25 mg by mouth 2 (two) times daily.     . Multiple Vitamin (MULTIVITAMIN) tablet Take 1 tablet by mouth daily.    . pregabalin (LYRICA) 75 MG capsule Take 75 mg by mouth 2 (two) times daily.    . Psyllium (METAMUCIL PO) Take by mouth at bedtime.    . sertraline (ZOLOFT) 50 MG tablet Take 50 mg by mouth at bedtime.    . sitaGLIPtin (JANUVIA) 50 MG tablet Take 50 mg by mouth daily.    . vitamin E 200 UNIT capsule Take 200 Units by mouth daily.      Home: Home Living Family/patient expects to be discharged to:: Private residence Living Arrangements: Spouse/significant other Available Help at Discharge: Family Type of Home: House Home Access: [place]: One Lane: Environmental consultant - 2 wheels, Environmental consultant - 4 wheels, [place] - quad, [place] - single point, Civil engineer, contracting, Grab bars - toilet  Functional History: Prior Function Level of Independence: Independent Comments: occassional use of cane/walker if bad day Functional Status:  Mobility: Bed Mobility Overal bed mobility: Needs Assistance, +2 for physical assistance Bed Mobility: Supine to Sit Supine to sit: Max assist, +2 for physical assistance General bed mobility comments: ised pillow to support BLE (feet) during transitional movements secondary to pain gfrom gout, increased physical assist to elevate trunk to EOB due to stiffness and pain. Transfers Overall transfer level: Needs assistance Equipment used: 2 person hand held assist (face to face with chuck pad and gait belt) Transfers: Sit to/from Stand, Stand Pivot Transfers Sit to Stand: Max assist, +2 physical assistance Stand pivot transfers: Total assist, +2 physical  assistance General transfer comment: Patient with poor physical strenghth and inability to bear weight through BLEs secondary to pain. Ambulation/Gait General Gait Details: unable to perform    ADL:    Cognition: Cognition Overall Cognitive Status: Within Functional Limits for tasks assessed Orientation Level: Oriented X4 Cognition Arousal/Alertness: Awake/alert Behavior During Therapy: WFL for tasks assessed/performed, Anxious Overall Cognitive Status: Within Functional Limits for tasks assessed  Blood pressure 132/66, pulse 77, temperature 97.4 F (36.3 C), temperature source Oral, resp. rate 21, height 5' 8"  (1.727 m), weight 109.7 kg (241 lb 13.5 oz), SpO2 91 %. Physical Exam  Constitutional: He is oriented to person, place, and time.  HENT:  Head: Normocephalic.  Eyes: EOM are normal.  Neck: Normal range of motion. Neck supple. No thyromegaly present.  Cardiovascular:  Cardiac rate controlled  Respiratory:  Good inspiratory effort clear to auscultation  GI: Soft. Bowel sounds are normal. He exhibits no distension.  Musculoskeletal:  Weak grip due to pain in fingers, swelling. Mild swelling at knees and bilateral ankles with associated mild erythema and warmth. Has pain with AROM and PROm of both knees and ankles.  Neurological: He is alert and oriented to person, place, and time.  Moves all 4's. Strength and ROM limited due to pain. UE's 3+ to 4 delt,bicep,tricep,  grip 3. LE: 2+ to 3- HF, KE and 3/5 ankles. No sensory changes  Skin: Skin is warm and dry.  Psychiatric: He has a normal mood and affect. His behavior is normal.     Lab Results Last 24 Hours    Results for orders placed or performed during the hospital encounter of 05/31/14 (from the past 24 hour(s))  Comprehensive metabolic panel Status: Abnormal   Collection Time: 06/07/14 6:53 AM  Result Value Ref Range   Sodium 127 (L) 135 - 145 mmol/L   Potassium 5.5 (H) 3.5 - 5.1 mmol/L    Chloride 92 (L) 96 - 112 mmol/L   CO2 23 19 - 32 mmol/L   Glucose, Bld 191 (H) 70 - 99 mg/dL   BUN 126 (H) 6 - 23 mg/dL   Creatinine, Ser 5.59 (H) 0.50 - 1.35 mg/dL   Calcium 7.0 (L) 8.4 - 10.5 mg/dL   Total Protein 5.9 (L) 6.0 - 8.3 g/dL   Albumin 1.9 (L) 3.5 - 5.2 g/dL   AST 33 0 - 37 U/L   ALT 36 0 - 53 U/L   Alkaline Phosphatase 79 39 - 117 U/L   Total Bilirubin 0.7 0.3 - 1.2 mg/dL   GFR calc non Af Amer 9 (L) >90 mL/min   GFR calc Af Amer 10 (L) >90 mL/min   Anion gap 12 5 - 15  Magnesium Status: Abnormal   Collection Time: 06/07/14 6:53 AM  Result Value Ref Range   Magnesium 2.7 (H) 1.5 - 2.5 mg/dL  CBC with Differential/Platelet Status: Abnormal   Collection Time: 06/07/14 6:53 AM  Result Value Ref Range   WBC 5.9 4.0 - 10.5 K/uL   RBC 3.71 (L) 4.22 - 5.81 MIL/uL   Hemoglobin 10.9 (L) 13.0 - 17.0 g/dL   HCT 31.1 (L) 39.0 - 52.0 %   MCV 83.8 78.0 - 100.0 fL   MCH 29.4 26.0 - 34.0 pg   MCHC 35.0 30.0 - 36.0 g/dL   RDW 12.7 11.5 - 15.5 %   Platelets  150 - 400 K/uL    PLATELET CLUMPS NOTED ON SMEAR, COUNT APPEARS ADEQUATE   Neutro Abs 5.0 1.7 - 7.7 K/uL   Lymphs Abs 0.3 (L) 0.7 - 4.0 K/uL   Monocytes Absolute 0.6 0.1 - 1.0 K/uL   Eosinophils Absolute 0.0 0.0 - 0.7 K/uL   Basophils Absolute 0.0 0.0 - 0.1 K/uL   Neutrophils Relative % 85 (H) 43 - 77 %   Lymphocytes Relative 6 (L) 12 - 46 %   Monocytes Relative 9 3 - 12 %   Eosinophils Relative 0 0 - 5 %   Basophils Relative 0 0 - 1 %   RBC Morphology POLYCHROMASIA PRESENT   Glucose, capillary Status: Abnormal   Collection Time: 06/07/14 7:36 AM  Result Value Ref Range   Glucose-Capillary 178 (H) 70 - 99 mg/dL   Comment 1 Notify RN   B. burgdorfi antibodies by WB *Canceled* Status: None ()   Collection Time: 06/07/14 8:38 AM     Narrative   LIS Cancel (ORR/DE = Data Error)  Creatinine, urine, random Status: None   Collection Time: 06/07/14 11:25 AM  Result Value Ref Range   Creatinine, Urine 86.61 mg/dL  Sodium, urine, random Status: None   Collection Time: 06/07/14 11:25 AM  Result Value Ref Range   Sodium, Ur 21 mmol/L  Glucose, capillary Status: Abnormal   Collection Time: 06/07/14 11:51 AM  Result Value Ref Range   Glucose-Capillary 194 (H)  70 - 99 mg/dL  Cortisol Status: None   Collection Time: 06/07/14 12:14 PM  Result Value Ref Range   Cortisol, Plasma 31.6 ug/dL  TSH Status: None   Collection Time: 06/07/14 12:14 PM  Result Value Ref Range   TSH 1.183 0.350 - 4.500 uIU/mL  Rheumatoid factor Status: None   Collection Time: 06/07/14 12:14 PM  Result Value Ref Range   Rhuematoid fact SerPl-aCnc 12.4 0.0 - 13.9 IU/mL  Glucose, capillary Status: Abnormal   Collection Time: 06/07/14 4:11 PM  Result Value Ref Range   Glucose-Capillary 221 (H) 70 - 99 mg/dL   Comment 1 Capillary Specimen   Basic metabolic panel Status: Abnormal   Collection Time: 06/07/14 6:20 PM  Result Value Ref Range   Sodium 126 (L) 135 - 145 mmol/L   Potassium 5.6 (H) 3.5 - 5.1 mmol/L   Chloride 93 (L) 96 - 112 mmol/L   CO2 21 19 - 32 mmol/L   Glucose, Bld 336 (H) 70 - 99 mg/dL   BUN 133 (H) 6 - 23 mg/dL   Creatinine, Ser 5.37 (H) 0.50 - 1.35 mg/dL   Calcium 6.8 (L) 8.4 - 10.5 mg/dL   GFR calc non Af Amer 10 (L) >90 mL/min   GFR calc Af Amer 11 (L) >90 mL/min   Anion gap 12 5 - 15  Glucose, capillary Status: Abnormal   Collection Time: 06/07/14 8:54 PM  Result Value Ref Range   Glucose-Capillary 359 (H) 70 - 99 mg/dL   Comment 1 Capillary Specimen   Renal function panel Status: Abnormal   Collection Time: 06/08/14 2:05 AM   Result Value Ref Range   Sodium 128 (L) 135 - 145 mmol/L   Potassium 5.1 3.5 - 5.1 mmol/L   Chloride 91 (L) 96 - 112 mmol/L   CO2 22 19 - 32 mmol/L   Glucose, Bld 345 (H) 70 - 99 mg/dL   BUN 140 (H) 6 - 23 mg/dL   Creatinine, Ser 5.27 (H) 0.50 - 1.35 mg/dL   Calcium 6.9 (L) 8.4 - 10.5 mg/dL   Phosphorus 8.0 (H) 2.3 - 4.6 mg/dL   Albumin 1.9 (L) 3.5 - 5.2 g/dL   GFR calc non Af Amer 10 (L) >90 mL/min   GFR calc Af Amer 11 (L) >90 mL/min   Anion gap 15 5 - 15  CBC Status: Abnormal   Collection Time: 06/08/14 2:05 AM  Result Value Ref Range   WBC 4.4 4.0 - 10.5 K/uL   RBC 3.36 (L) 4.22 - 5.81 MIL/uL   Hemoglobin 9.6 (L) 13.0 - 17.0 g/dL   HCT 27.7 (L) 39.0 - 52.0 %   MCV 82.4 78.0 - 100.0 fL   MCH 28.6 26.0 - 34.0 pg   MCHC 34.7 30.0 - 36.0 g/dL   RDW 12.4 11.5 - 15.5 %   Platelets 259 150 - 400 K/uL      Imaging Results (Last 48 hours)    Dg Chest Port 1 View  06/06/2014 CLINICAL DATA: 74 year old male with congestive heart failure EXAM: PORTABLE CHEST - 1 VIEW COMPARISON: Prior chest x-ray 06/03/2014 FINDINGS: Stable cardiomegaly with left heart prominence. Decreased interstitial pulmonary edema but persistent pulmonary vascular congestion. Left retrocardiac opacity remains unchanged. No large pleural effusion and no evidence of pneumothorax. No acute osseous abnormality. IMPRESSION: 1. Resolving pulmonary interstitial edema with persistent vascular congestion. 2. Unchanged left retrocardiac opacity favored to reflect a combination of atelectasis and perhaps pleural effusion. Electronically Signed By: Jacqulynn Cadet M.D. On: 06/06/2014 12:06  Assessment/Plan: Diagnosis: debility after CHF/respiratory failure. ?polyarticular gout 1. Does the need for close, 24 hr/day medical supervision in concert with the patient's rehab needs make it unreasonable for this  patient to be served in a less intensive setting? Yes 2. Co-Morbidities requiring supervision/potential complications: dm2, htn,  3. Due to bladder management, bowel management, safety, skin/wound care, disease management, medication administration, pain management and patient education, does the patient require 24 hr/day rehab nursing? Yes 4. Does the patient require coordinated care of a physician, rehab nurse, PT (1-2 hrs/day, 5 days/week) and OT (1-2 hrs/day, 5 days/week) to address physical and functional deficits in the context of the above medical diagnosis(es)? Yes Addressing deficits in the following areas: balance, endurance, locomotion, strength, transferring, bowel/bladder control, bathing, dressing, feeding, grooming, toileting and psychosocial support 5. Can the patient actively participate in an intensive therapy program of at least 3 hrs of therapy per day at least 5 days per week? Yes 6. The potential for patient to make measurable gains while on inpatient rehab is excellent 7. Anticipated functional outcomes upon discharge from inpatient rehab are supervision and min assist with PT, supervision and min assist with OT, n/a with SLP. 8. Estimated rehab length of stay to reach the above functional goals is: 15-20 days 9. Does the patient have adequate social supports and living environment to accommodate these discharge functional goals? Yes 10. Anticipated D/C setting: Home 11. Anticipated post D/C treatments: [place] therapy 12. Overall Rehab/Functional Prognosis: excellent  RECOMMENDATIONS: This patient's condition is appropriate for continued rehabilitative care in the following setting: CIR Patient has agreed to participate in recommended program. Yes Note that insurance prior authorization may be required for reimbursement for recommended care.  Comment: Rehab Admissions Coordinator to follow up.  Thanks,  Meredith Staggers, MD, Mellody Drown     06/08/2014       Revision  History     Date/Time User Provider Type Action   06/08/2014 9:12 AM Meredith Staggers, MD Physician Sign   06/08/2014 6:18 AM Cathlyn Parsons, PA-C Physician Assistant Pend   View Details Report       Routing History     Date/Time From To Method   06/08/2014 9:12 AM Meredith Staggers, MD Meredith Staggers, MD In Basket   06/08/2014 9:12 AM Meredith Staggers, MD [name], MD Fax

## 2014-06-11 NOTE — H&P (View-Only) (Signed)
Physical Medicine and Rehabilitation Admission H&P    Chief complaint:Weakness  HPI: Darrell Richardson is a 74 y.o. right handed male with history of CVA with residual left hemiparesis, diabetes mellitus peripheral neuropathy, gout, diastolic congestive heart failure, chronic renal insufficiency 2.44. Lives with his wife and used a cane prior to admission. Presented to [facility] 05/31/2014 with increasing nonspecific chest pain and shortness of breath and transferred to [facility] for further medical evaluation. Complaints of increased shortness of breath and lower extremity edema. He was found to have acute respiratory failure secondary to acute CHF exacerbation placed on BiPAP. Troponin was negative. BNP 7363 Patient placed on Lasix. Echocardiogram with ejection fraction of 45% with akinesis of the inferior lateral and inferior myocardium. Progressive increase in creatinine function from baseline 2.4-5.37 Renal service is consulted. Renal ultrasound showed no hydronephrosis and latest creatinine 4.02. Complaints of joint pain low-grade fever with ESR 135 as well as uric acid level 8.3. Patient had been placed on Solu-Medrol with workup concerning for PMR versus RA. It was felt prednisone was inducing increased renal function. TEE completed 06/07/2014 showing no vegetation moderate sized PFO with left-to-right shunting at rest. Full workups remain underway with ANCA studies normal.. Palliative care has been consult to establish goals of care. MRI lumbar spine revealed some bulging disc and some spinal stenosis. Also concern over bone marrow abnormalities with neurology services to follow-up with bone scan completed for clarity that showed no evidence of osseous metastatic disease 06/10/2014. Subcutaneous heparin for DVT prophylaxis. Physical therapy evaluation completed 06/05/2014 with recommendations of physical medicine rehabilitation consult.Patient was admitted for a  comprehensive rehab program.  ROS Review of Systems  HENT: Positive for hearing loss.  Respiratory: Positive for shortness of breath.  Cardiovascular: Positive for leg swelling.  Gastrointestinal: Positive for constipation.  Musculoskeletal: Positive for myalgias and joint pain.  Psychiatric/Behavioral: Positive for depression.  All other systems reviewed and are negative   Past Medical History  Diagnosis Date  . Hearing loss   . Gout   . Diabetes   . Fatigue   . Hypertension   . Stroke     X 2  . Heart failure   . Muscle pain   . Hammertoe    Past Surgical History  Procedure Laterality Date  . Stomach surgery    . Foot surgery Right   . Tee without cardioversion N/A 06/07/2014    Procedure: TRANSESOPHAGEAL ECHOCARDIOGRAM (TEE);  Surgeon: Jolaine Artist, MD;  Location: [facility] ENDOSCOPY;  Service: Cardiovascular;  Laterality: N/A;   Family History  Problem Relation Age of Onset  . Dementia Mother    Social History:  reports that he has quit smoking. He does not have any smokeless tobacco history on file. He reports that he drinks alcohol. He reports that he does not use illicit drugs. Allergies:  Allergies  Allergen Reactions  . Cortisone Anaphylaxis and Other (See Comments)    Cold, elevated BP, shaking all over and fever  Wife says tolerates hydrocortisone cream at home  . Ambien [Zolpidem Tartrate]     Did not tolerate well or remembered anything from previous night  . Rocephin [Ceftriaxone] Hives  . Levaquin [Levofloxacin In D5w] Rash   Medications Prior to Admission  Medication Sig Dispense Refill  . albuterol (PROAIR HFA) 108 (90 BASE) MCG/ACT inhaler Inhale 2 puffs into the lungs every 4 (four) hours as needed for wheezing or shortness of breath. Inhale 1-2 puffs four times daily as needed    .  allopurinol (ZYLOPRIM) 100 MG tablet Take 100 mg by mouth 2 (two) times daily.    Marland Kitchen amLODipine (NORVASC) 5 MG tablet Take 5 mg by mouth 2 (two) times daily.    Marland Kitchen  atorvastatin (LIPITOR) 20 MG tablet Take 40 mg by mouth daily.     . Cholecalciferol (VITAMIN D3) 1000 UNITS CAPS Take 1,000 Units by mouth daily.    . clopidogrel (PLAVIX) 75 MG tablet Take 75 mg by mouth daily with breakfast.    . docusate sodium (COLACE) 100 MG capsule Take 100 mg by mouth at bedtime.    . furosemide (LASIX) 80 MG tablet Take 80 mg by mouth daily.    Marland Kitchen HYDROcodone-acetaminophen (VICODIN ES) 7.5-750 MG per tablet Take 1 tablet by mouth every 6 (six) hours as needed for pain. Take 1/2 - 1 four times a day as needed    . losartan (COZAAR) 50 MG tablet Take 50 mg by mouth daily.    . metoprolol succinate (TOPROL-XL) 25 MG 24 hr tablet Take 25 mg by mouth 2 (two) times daily.     . Multiple Vitamin (MULTIVITAMIN) tablet Take 1 tablet by mouth daily.    . pregabalin (LYRICA) 75 MG capsule Take 75 mg by mouth 2 (two) times daily.    . Psyllium (METAMUCIL PO) Take by mouth at bedtime.    . sertraline (ZOLOFT) 50 MG tablet Take 50 mg by mouth at bedtime.    . sitaGLIPtin (JANUVIA) 50 MG tablet Take 50 mg by mouth daily.    . vitamin E 200 UNIT capsule Take 200 Units by mouth daily.      Home: Home Living Family/patient expects to be discharged to:: Private residence Living Arrangements: Spouse/significant other Available Help at Discharge: Family Type of Home: House Home Access: [place]: [place]: Environmental consultant - 2 wheels, Environmental consultant - 4 wheels, [place] - quad, [place] - single point, Civil engineer, contracting, Grab bars - tub/shower   Functional History: Prior Function Level of Independence: Independent Comments: occassional use of cane/walker if bad day  Functional Status:  Mobility: Bed Mobility Overal bed mobility: Needs Assistance Bed Mobility: Supine to Sit Supine to sit: Mod assist, HOB elevated General bed mobility comments: Pt able to move legs off EOB. Assist to elevate trunk and bring hips to EOB. Transfers Overall transfer level: Needs  assistance Equipment used: Ambulation equipment used ([employer] and [employer]) Transfers: Sit to/from Stand, [employer] Sit to Stand: +2 physical assistance, Total assist, From elevated surface Stand pivot transfers: +2 physical assistance, Total assist, From elevated surface General transfer comment: Attempted x 5 to stand with Bariatric Stedy and pt able to clear hips from bed 8-10 inches but unable to achieve full stand. Used Clarise Cruz Plus to transfer from bed to chair and with extra support at hips while pivoting due to hips/knees flexing. Ambulation/Gait General Gait Details: unable to perform    ADL: ADL Overall ADL's : Needs assistance/impaired Eating/Feeding: Independent, Sitting Grooming: Set up, Sitting Upper Body Bathing: Sitting, Moderate assistance Upper Body Bathing Details (indicate cue type and reason): pt with difficulty maintaining sitting balance during UB ADL Lower Body Bathing: Total assistance, Bed level Upper Body Dressing : Moderate assistance, Sitting Upper Body Dressing Details (indicate cue type and reason): pt with difficulty maintaining sitting balance with UB dressing Lower Body Dressing: Total assistance, Bed level  Cognition: Cognition Overall Cognitive Status: Within Functional Limits for tasks assessed Orientation Level: Oriented X4 Cognition Arousal/Alertness: Awake/alert Behavior During Therapy: [facility]  for tasks assessed/performed Overall Cognitive Status: Within Functional Limits for tasks assessed  Physical Exam: Blood pressure 152/63, pulse 69, temperature 97.4 F (36.3 C), temperature source Oral, resp. rate 18, height 5' 8"  (1.727 m), weight 106.1 kg (233 lb 14.5 oz), SpO2 98 %. Physical Exam Constitutional: He is oriented to person, place, and time. obese HENT: conjunctiva appear slightly inflamed. No drainage Head: Normocephalic.  Eyes: EOM are normal.  Neck: Normal range of motion. Neck supple. No thyromegaly present.   Cardiovascular:  Cardiac rate controlled without murmur Respiratory:  Good inspiratory effort clear to auscultation  GI: Soft. Bowel sounds are normal. He exhibits no distension.  Musculoskeletal:  Weak grip due to pain in fingers, swelling. Mild swelling at knees and bilateral ankles with associated mild erythema and warmth. Has pain with AROM and PROm of both knees and ankles. Right ankle remains slightly more swollen than the left. No erythema. Only mild warmth. Pain worse with palpation along the medial malleoli and with inversion of the foot. Seems to have cord of taut muscle along the rectus femoris.  Neurological: He is alert and oriented to person, place, and time.  Moves all 4's. Strength and ROM limited due to pain. UE's 3+ to 4 delt,bicep,tricep, grip 3-. LE: 2+ to 3- HF, KE and 3/5 Right ankle 3+ left ankle. No sensory changes  Skin: Skin is warm and dry.  Psychiatric: He has a normal mood and affect. His behavior is normal. Pleasant and cooperative    Results for orders placed or performed during the hospital encounter of 05/31/14 (from the past 48 hour(s))  Glucose, capillary     Status: Abnormal   Collection Time: 06/08/14 11:09 AM  Result Value Ref Range   Glucose-Capillary 282 (H) 70 - 99 mg/dL   Comment 1 Notify RN   Glucose, capillary     Status: Abnormal   Collection Time: 06/08/14  4:31 PM  Result Value Ref Range   Glucose-Capillary 422 (H) 70 - 99 mg/dL   Comment 1 Notify RN   Glucose, capillary     Status: Abnormal   Collection Time: 06/08/14  8:25 PM  Result Value Ref Range   Glucose-Capillary 373 (H) 70 - 99 mg/dL   Comment 1 Capillary Specimen   Glucose, capillary     Status: Abnormal   Collection Time: 06/09/14 12:27 AM  Result Value Ref Range   Glucose-Capillary 271 (H) 70 - 99 mg/dL   Comment 1 Capillary Specimen   CBC     Status: Abnormal   Collection Time: 06/09/14  3:09 AM  Result Value Ref Range   WBC 7.6 4.0 - 10.5 K/uL   RBC 3.68 (L) 4.22 -  5.81 MIL/uL   Hemoglobin 10.8 (L) 13.0 - 17.0 g/dL   HCT 30.7 (L) 39.0 - 52.0 %   MCV 83.4 78.0 - 100.0 fL   MCH 29.3 26.0 - 34.0 pg   MCHC 35.2 30.0 - 36.0 g/dL   RDW 12.5 11.5 - 15.5 %   Platelets 313 150 - 400 K/uL  Basic metabolic panel     Status: Abnormal   Collection Time: 06/09/14  3:09 AM  Result Value Ref Range   Sodium 132 (L) 135 - 145 mmol/L   Potassium 4.6 3.5 - 5.1 mmol/L   Chloride 96 96 - 112 mmol/L   CO2 21 19 - 32 mmol/L   Glucose, Bld 212 (H) 70 - 99 mg/dL   BUN 148 (H) 6 - 23 mg/dL   Creatinine, Ser  4.36 (H) 0.50 - 1.35 mg/dL   Calcium 7.1 (L) 8.4 - 10.5 mg/dL   GFR calc non Af Amer 12 (L) >90 mL/min   GFR calc Af Amer 14 (L) >90 mL/min    Comment: (NOTE) The eGFR has been calculated using the CKD EPI equation. This calculation has not been validated in all clinical situations. eGFR's persistently <90 mL/min signify possible Chronic Kidney Disease.    Anion gap 15 5 - 15  Glucose, capillary     Status: Abnormal   Collection Time: 06/09/14  5:23 AM  Result Value Ref Range   Glucose-Capillary 189 (H) 70 - 99 mg/dL  Glucose, capillary     Status: Abnormal   Collection Time: 06/09/14  8:16 AM  Result Value Ref Range   Glucose-Capillary 148 (H) 70 - 99 mg/dL   Comment 1 Capillary Specimen   Glucose, capillary     Status: Abnormal   Collection Time: 06/09/14 12:46 PM  Result Value Ref Range   Glucose-Capillary 195 (H) 70 - 99 mg/dL  Glucose, capillary     Status: Abnormal   Collection Time: 06/09/14  4:50 PM  Result Value Ref Range   Glucose-Capillary 209 (H) 70 - 99 mg/dL  Glucose, capillary     Status: Abnormal   Collection Time: 06/09/14  7:28 PM  Result Value Ref Range   Glucose-Capillary 208 (H) 70 - 99 mg/dL  Sedimentation rate     Status: Abnormal   Collection Time: 06/10/14  4:44 AM  Result Value Ref Range   Sed Rate 28 (H) 0 - 16 mm/hr  Hepatic function panel     Status: Abnormal   Collection Time: 06/10/14  4:44 AM  Result Value Ref  Range   Total Protein 5.5 (L) 6.0 - 8.3 g/dL   Albumin 2.2 (L) 3.5 - 5.2 g/dL   AST 21 0 - 37 U/L   ALT 32 0 - 53 U/L   Alkaline Phosphatase 72 39 - 117 U/L   Total Bilirubin 0.6 0.3 - 1.2 mg/dL   Bilirubin, Direct 0.1 0.0 - 0.5 mg/dL   Indirect Bilirubin 0.5 0.3 - 0.9 mg/dL  Basic metabolic panel     Status: Abnormal   Collection Time: 06/10/14  4:44 AM  Result Value Ref Range   Sodium 134 (L) 135 - 145 mmol/L   Potassium 4.0 3.5 - 5.1 mmol/L   Chloride 97 96 - 112 mmol/L   CO2 25 19 - 32 mmol/L   Glucose, Bld 161 (H) 70 - 99 mg/dL   BUN 145 (H) 6 - 23 mg/dL   Creatinine, Ser 4.02 (H) 0.50 - 1.35 mg/dL   Calcium 7.4 (L) 8.4 - 10.5 mg/dL   GFR calc non Af Amer 13 (L) >90 mL/min   GFR calc Af Amer 16 (L) >90 mL/min    Comment: (NOTE) The eGFR has been calculated using the CKD EPI equation. This calculation has not been validated in all clinical situations. eGFR's persistently <90 mL/min signify possible Chronic Kidney Disease.    Anion gap 12 5 - 15  Cortisol     Status: None   Collection Time: 06/10/14  5:00 AM  Result Value Ref Range   Cortisol, Plasma 18.9 ug/dL    Comment: (NOTE) AM:  4.3 - 22.4 ug/dL PM:  3.1 - 16.7 ug/dL Performed at [facility]   Glucose, capillary     Status: Abnormal   Collection Time: 06/10/14  6:10 AM  Result Value Ref Range   Glucose-Capillary  159 (H) 70 - 99 mg/dL   Mr Lumbar Spine Wo Contrast  06/09/2014   CLINICAL DATA:  Focal lower extremity weakness. History of generalized weakness. No acute back pain, bowel or bladder symptoms. Initial encounter.  EXAM: MRI LUMBAR SPINE WITHOUT CONTRAST  TECHNIQUE: Multiplanar, multisequence MR imaging of the lumbar spine was performed. No intravenous contrast was administered.  COMPARISON:  Abdominal pelvic CT 08/13/2013.  FINDINGS: The lumbar spine examination is mildly motion degraded, especially on the axial images. CT demonstrates 5 lumbar type vertebral bodies. The alignment is stable and  near anatomic. The marrow signal within the spine is mildly heterogeneous without focally suspicious lesion. There are foci of decreased T1 and T2 signal in the left iliac bone without clear corresponding finding on prior CT.  The conus medullaris extends to the T12-L1 level and appears normal. No focal paraspinal abnormalities are identified. There is subcutaneous edema in the back.  No significant disc space findings are seen from T10-11 through L2-3. There are paraspinal osteophytes anteriorly in the lower thoracic spine.  L3-4: Mild annular disc bulging. There is facet and ligamentous hypertrophy with bilateral facet joint effusions. These factors contribute to mild spinal stenosis with mild narrowing of both lateral recesses.  L4-5: Mild annular disc bulging. There is moderate facet and ligamentous hypertrophy with bilateral facet joint effusions. These factors contribute to moderate spinal stenosis with asymmetric narrowing of the right lateral recess and right foramen. There is mild subchondral edema within the facet joints without bone destruction or periarticular fluid collection.  L5-S1: Disc height and hydration are maintained. Mild bilateral facet hypertrophy. No spinal stenosis or nerve root encroachment.  IMPRESSION: 1. Moderate facet and ligamentous hypertrophy with facet joint effusions and subchondral edema bilaterally at L3-4 and L4-5. 2. Associated annular disc bulging at both levels, contributing to mild spinal stenosis at L3-4 and moderate spinal stenosis at L4-5. 3. Nonspecific marrow lesions within the left iliac bone. Early metastatic disease cannot be completely excluded. No suspicious lesions demonstrated within the lumbar spine.   Electronically Signed   By: Richardean Sale M.D.   On: 06/09/2014 17:06   Mr Sacrum/si Joints Wo Contrast  06/09/2014   CLINICAL DATA:  Focal lower extremity weakness. History of generalized weakness. No acute back pain, bowel or bladder symptoms. Initial  encounter.  EXAM: MR SACRUM WITHOUT CONTRAST  TECHNIQUE: Multiplanar, multisequence MR imaging was performed. No intravenous contrast was administered.  COMPARISON:  Pelvic CT 08/13/2013.  FINDINGS: Lumbar spine findings dictated separately.  No evidence of sacral fracture or destruction. The sacroiliac joints are intact without erosive change. There is no presacral fluid collection. There is no sacral nerve foraminal compromise or nerve root encroachment.  There is mildly heterogeneous marrow signal within the sacrum and iliac bones on the T1 weighted images. No suspicious lesions demonstrated on T2 or inversion recovery imaging.  Foley catheter is in place. The bladder is decompressed and demonstrates mild wall thickening. There is nodularity throughout the peripheral and central portions of the prostate gland. Asymmetric fat in the right inguinal canal is incompletely visualized, although grossly stable. There is nonspecific subcutaneous edema dependently in the buttocks as well as mild perirectal edema.  IMPRESSION: 1. Negative MRI of the sacrum and sacroiliac joints. 2. No evidence of sacral nerve root encroachment. 3. Lumbar spine findings dictated separately.   Electronically Signed   By: Richardean Sale M.D.   On: 06/09/2014 16:54       Medical Problem List and Plan: 1. Functional deficits  secondary to debilitation after CHF exacerbation/respiratory failure/question polyarticular gout. 2.  DVT Prophylaxis/Anticoagulation: Subcutaneous heparin. Monitor platelet counts and any signs of bleeding 3. Pain Management: Lyrica 75 mg daily, Hydrocodone as needed. Monitor with increased mobility 4. Mood/depression. Zoloft 50 mg daily. Provide emotional support 5. Neuropsych: This patient is capable of making decisions on his own behalf. 6. Skin/Wound Care: Routine skin checks 7. Fluids/Electrolytes/Nutrition: Strict I&O with follow-up chemistries 8. Chronic renal insufficiency with baseline creatinine  2.44 since increased to 5.37 and stabilizing. It was felt elevation possibly due to prednisone therapy as well as in the setting of decompensated CHF and hypotension. Renal service to follow-up 9. 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 No significant improvement with steroids. Negative TEE. Negative anti-histone DNA. Await further studies. Continue Solu-Medrol for now and plan Taper as we await further studies 10. Acute on chronic diastolic congestive heart failure. Continue to monitor with any signs of fluid overload. Lasix has since been discontinued 11. Diabetes mellitus with peripheral neuropathy. Monitor closely while on Solu-Medrol. Hemoglobin A1c 7.5. Lantus insulin 18 units daily. Check blood sugars before meals and at bedtime 12. History of CVA with left hemiparesis. Continue Plavix   Post Admission Physician Evaluation: 1. Functional deficits secondary  to Debility after respiratory failure. Mobility issues compounded by polyarticular gout. 2. Patient is admitted to receive collaborative, interdisciplinary care between the physiatrist, rehab nursing staff, and therapy team. 3. Patient's level of medical complexity and substantial therapy needs in context of that medical necessity cannot be provided at a lesser intensity of care such as a SNF. 4. Patient has experienced substantial functional loss from his/her baseline which was documented above under the "Functional History" and "Functional Status" headings.  Judging by the patient's diagnosis, physical exam, and functional history, the patient has potential for functional progress which will result in measurable gains while on inpatient rehab.  These gains will be of substantial and practical use upon discharge  in facilitating mobility and self-care at the household level. 5. Physiatrist will provide 24 hour management of medical needs as well as oversight of the therapy plan/treatment and provide guidance as appropriate regarding the  interaction of the two. 6. 24 hour rehab nursing will assist with bladder management, bowel management, safety, skin/wound care, disease management, medication administration, pain management and patient education  and help integrate therapy concepts, techniques,education, etc. 7. PT will assess and treat for/with: Lower extremity strength, range of motion, stamina, balance, functional mobility, safety, adaptive techniques and equipment, pain mgt, NMR, acitivity tolerance, care giver education.   Goals are: supervision to min assist. 8. OT will assess and treat for/with: ADL's, functional mobility, safety, upper extremity strength, adaptive techniques and equipment, NMR, pain mgt, activity tolerance, coping skills, family ed, ego support.   Goals are: supervision to min assist. Therapy may proceed with showering this patient. 9. SLP will assess and treat for/with: n/a.  Goals are: n/a. 10. Case Management and Social Worker will assess and treat for psychological issues and discharge planning. 11. Team conference will be held weekly to assess progress toward goals and to determine barriers to discharge. 12. Patient will receive at least 3 hours of therapy per day at least 5 days per week. 13. ELOS: 15-20 days       14. Prognosis:  good     Meredith Staggers, MD, [facility] 06/11/2014   06/10/2014

## 2014-06-11 NOTE — Progress Notes (Deleted)
PMR Admission Coordinator Pre-Admission Assessment  Patient: Darrell Richardson is an 74 y.o., male MRN: [id number] DOB: [birth year] Height: 5' 8"  (172.7 cm) Weight: 103.874 kg (229 lb)  Insurance Information HMO: PPO: PCP: IPA: 80/20: yes OTHER: no HMO PRIMARY: Medicare a and b Policy#: [id number] Subscriber: pt Benefits: Phone #: palmetto online Name: 06/10/14 Eff. Date: 07/08/05 Deduct: $1288 Out of Pocket Max: none Life Max: none CIR: 100% SNF: 20 full days Outpatient: 80% Co-Pay: 20% Home Health: 100% Co-Pay: none DME: 80% Co-Pay: 20% Providers: pt choice  SECONDARY: [employer] of [place] supplement Policy#: [id number] Subscriber: pt  Medicaid Application Date: Case Manager:  Disability Application Date: Case Worker:   Emergency Contact Information Contact Information    Name Relation Home Work [place]  [phone]     Mahad, [place]   [phone]   [place] Daughter   [phone]     Current Medical History  Patient Admitting Diagnosis: debility after CHF/respiratory failure. R/o polyarticular gout  History of Present Illness: Darrell Richardson is a 74 y.o. right handed male with history of CVA with residual left hemiparesis, diabetes mellitus peripheral neuropathy, gout, diastolic congestive heart failure, chronic renal insufficiency 2.44. Lives with his wife and used a cane prior to admission.  Presented to [facility] 05/31/2014 with increasing nonspecific chest pain and shortness of breath and transferred to [facility] for further medical evaluation. Complaints of increased shortness of breath and lower extremity edema. He was found to have acute respiratory failure secondary to  acute CHF exacerbation placed on BiPAP. Troponin was negative. BNP 7363 Patient placed on Lasix. Echocardiogram with ejection fraction of 45% with akinesis of the inferior lateral and inferior myocardium.  Progressive increase in creatinine function from baseline 2.4-3.9. Renal service is consulting. Renal ultrasound showed no hydronephrosis. Known Chronic kidney disease at baseline.   Complaints of joint pain low-grade fever with ESR 135 as well as uric acid level 8.3. Patient had been placed on prednisone therapy with workup concerning for PMR versus RA. It was felt prednisone was inducing increased renal function. TEE completed 06/07/2014 showing no vegetation moderate sized PFO with left-to-right shunting at rest. Began IV solumedrol. Felt overall feeling of unwellness may be due to BUN 143 and due to gout. Neurology consulted 3/3 and felt lower extremity weakness secondary to disuse and scans of lumbar spine and sacroliliac showing radiculopathy. Pt placed in IV solumedrol. Dr. Haroldine Laws has spoke with Dr. Amil Amen in Rheumatolgy who is reviewing pt's case in light of lack of clinical response to steroids and concerns for underlying malignancy.   Palliaative team consulted 3/3 for goals of care. He requested no intubation. Wanted better pain management, to pursue inpt rehab admission and discussing if he would ever want dialysis acutely or longterm or not.   Past Medical History  Past Medical History  Diagnosis Date  . Hearing loss   . Gout   . Diabetes   . Fatigue   . Hypertension   . Stroke     X 2  . Heart failure   . Muscle pain   . Hammertoe     Family History  family history includes Dementia in his mother.  Prior Rehab/Hospitalizations: HH or OP only after CVA 2009 and 2010  Current Medications   Current facility-administered medications:  . acetaminophen (TYLENOL) tablet 650 mg, 650 mg, Oral, Q6H PRN, 325 mg at 06/04/14 0114 **OR**  acetaminophen (TYLENOL) suppository 650 mg, 650 mg, Rectal, Q6H PRN, Verlee Monte, MD .  antiseptic oral rinse (CPC / CETYLPYRIDINIUM CHLORIDE 0.05%) solution 7 mL, 7 mL, Mouth Rinse, BID, Verlee Monte, MD, 7 mL at 06/10/14 2218 . clopidogrel (PLAVIX) tablet 75 mg, 75 mg, Oral, Daily, Verlee Monte, MD, 75 mg at 06/10/14 1032 . diphenhydrAMINE (BENADRYL) capsule 25 mg, 25 mg, Oral, Q6H PRN, Amy D Clegg, NP, 25 mg at 06/01/14 1109 . doxycycline (VIBRA-TABS) tablet 100 mg, 100 mg, Oral, Q12H, Jolaine Artist, MD, 100 mg at 06/10/14 2218 . heparin injection 5,000 Units, 5,000 Units, Subcutaneous, 3 times per day, Verlee Monte, MD, 5,000 Units at 06/11/14 3086 . hydrALAZINE (APRESOLINE) tablet 5 mg, 5 mg, Oral, BID, Allie Bossier, MD, 5 mg at 06/10/14 2217 . HYDROcodone-acetaminophen (NORCO/VICODIN) 5-325 MG per tablet 1-2 tablet, 1-2 tablet, Oral, Q4H PRN, Verlee Monte, MD, 1 tablet at 06/11/14 0900 . hydrocortisone cream 1 %, , Topical, QID PRN, Amy D Clegg, NP . insulin aspart (novoLOG) injection 0-20 Units, 0-20 Units, Subcutaneous, TID WC, Cherene Altes, MD, 3 Units at 06/11/14 [phone] . insulin glargine (LANTUS) injection 18 Units, 18 Units, Subcutaneous, Daily, Cherene Altes, MD, 18 Units at 06/10/14 1033 . ipratropium-albuterol (DUONEB) 0.5-2.5 (3) MG/3ML nebulizer solution 3 mL, 3 mL, Nebulization, Q6H PRN, Allie Bossier, MD . isosorbide mononitrate (IMDUR) 24 hr tablet 15 mg, 15 mg, Oral, Daily, Amy D Clegg, NP, 15 mg at 06/10/14 1050 . methylPREDNISolone sodium succinate (SOLU-MEDROL) 125 mg/2 mL injection 125 mg, 125 mg, Intravenous, Q24H, Dawood Elgergawy, MD . morphine 2 MG/ML injection 1 mg, 1 mg, Intravenous, Q4H PRN, Verlee Monte, MD, 1 mg at 06/10/14 1643 . ondansetron (ZOFRAN) injection 4 mg, 4 mg, Intravenous, Q6H PRN, Jolaine Artist, MD, 4 mg at 06/04/14 0103 . oxymetazoline (AFRIN) 0.05 % nasal spray 1 spray, 1 spray, Each Nare, BID PRN, Jolaine Artist, MD . pregabalin (LYRICA) capsule 75 mg, 75 mg, Oral, Daily, Jolaine Artist, MD, 75 mg at 06/10/14 1036 . psyllium (HYDROCIL/METAMUCIL) packet 1 packet, 1 packet, Oral, QHS, Jolaine Artist, MD, 1 packet at 06/09/14 2323 . sertraline (ZOLOFT) tablet 50 mg, 50 mg, Oral, QHS, Jolaine Artist, MD, 50 mg at 06/10/14 2218 . sodium chloride 0.9 % injection 3 mL, 3 mL, Intravenous, Q12H, Verlee Monte, MD, 3 mL at 06/10/14 2218  Patients Current Diet: Diet renal/carb modified with 1200 ml fluid restriction  Precautions / Restrictions Precautions Precautions: Fall Precaution Comments: significant pain above right knee and Bil ankles (possibly reminants of gout flare up) Restrictions Weight Bearing Restrictions: No   Prior Activity Level Pt used cane pta. Had walked to mail box without pain or difficulty pta. Had a ramp built at home for there were times that his knees would give him trouble to walk up steps at times pta. Did own adls.  Home Assistive Devices / Equipment Home Assistive Devices/Equipment: CBG Meter, Dentures (specify type), Raised toilet seat with rails, Walker (specify type) Home Equipment: Gilford Rile - 2 wheels, Walker - 4 wheels, [employer] - quad, [employer] - single point, Civil engineer, contracting, Grab bars - tub/shower  Prior Functional Level Prior Function Level of Independence: Independent Comments: occassional use of cane/walker if bad day  Current Functional Level Cognition  Overall Cognitive Status: Within Functional Limits for tasks assessed Orientation Level: Oriented X4   Extremity Assessment (includes Sensation/Coordination)  Upper Extremity Assessment: Overall WFL for tasks assessed  Lower Extremity Assessment: Defer to PT evaluation LLE Deficits / Details: modest residual stroke deficits, 3-/5    ADLs  Overall ADL's :  Needs assistance/impaired Eating/Feeding: Independent, Sitting Grooming: Set up, Sitting Upper Body Bathing: Sitting, Moderate  assistance Upper Body Bathing Details (indicate cue type and reason): pt with difficulty maintaining sitting balance during UB ADL Lower Body Bathing: Total assistance, Bed level Upper Body Dressing : Moderate assistance, Sitting Upper Body Dressing Details (indicate cue type and reason): pt with difficulty maintaining sitting balance with UB dressing Lower Body Dressing: Total assistance, Bed level Toilet Transfer: Moderate assistance, +2 for physical assistance (bari stedy)    Mobility  Overal bed mobility: Needs Assistance, +2 for physical assistance Bed Mobility: Supine to Sit Supine to sit: Mod assist, HOB elevated General bed mobility comments: pt move Bil LEs to EOB first and then cued to reach for left upper rail with Bil UEs and try to scoot around more, but could not so A'd pt with bed pad at which point he c/o pf excruciating pain above right knee--I lifted RUE back up to full extension and held it there for approximately 30 seconds then was able to lower it back down and help pt sit up to EOB with A to elevate trunk and scoot to EOB with pad    Transfers  Overall transfer level: Needs assistance Equipment used: 2 person hand held assist Judie Petit stedy) Transfers: Sit to/from Stand (partial) Sit to Stand: +2 physical assistance, Total assist, From elevated surface Stand pivot transfers: +2 physical assistance, Total assist, From elevated surface General transfer comment: First attemtp pt not able to get up and forward enough to get seat of Jeralene Huff aroudn under him. On second attempt pt just barely got up and forward enough to clear buttocks enough from bed (raised) to get Jeralene Huff seat rotated around under him    Ambulation / Gait / Stairs / Wheelchair Mobility  Ambulation/Gait General Gait Details: unable to perform    Posture / Balance Dynamic Sitting Balance Sitting balance - Comments: Pt sat EOB x 20 minutes. Initially able to sit statically with supervision.  Increased assist needed with functional activities and fatigue. Balance Overall balance assessment: Needs assistance Sitting-balance support: Bilateral upper extremity supported, Feet supported Sitting balance-Leahy Scale: Poor Sitting balance - Comments: Pt sat EOB x 20 minutes. Initially able to sit statically with supervision. Increased assist needed with functional activities and fatigue. Postural control: Posterior lean Standing balance support: Bilateral upper extremity supported Standing balance-Leahy Scale: Zero Standing balance comment: Clarise Cruz Plus    Special needs/care consideration Bowel mgmt: continent last BM 3/2 Bladder mgmt: foley. Pt requesting removal asap Diabetic mgmt Hgb A1c 7.5 wife checks pt's cbgs at home and BP to manage his meds   Previous Home Environment Living Arrangements: Spouse/significant other Lives With: Spouse Available Help at Discharge: Family Type of Home: House Home Layout: One level Home Access: Ramped entrance Bathroom Shower/Tub: Multimedia programmer: Handicapped height Bathroom Accessibility: Yes How Accessible: Accessible via walker [place]: No  Discharge Living Setting Plans for Discharge Living Setting: Patient's home, Lives with (comment), Other (Comment) (wife) Type of Home at Discharge: House Discharge Home Layout: One level Discharge Home Access: [place] entrance Discharge Bathroom Shower/Tub: Walk-in shower Discharge Bathroom Toilet: Handicapped height Discharge Bathroom Accessibility: Yes How Accessible: Accessible via walker Does the patient have any problems obtaining your medications?: No  Social/Family/Support Systems Patient Roles: Spouse Contact Information: Amirr Achord, wife Anticipated Caregiver: wife Anticipated Caregiver's Contact Information: see above Ability/Limitations of Caregiver: min assist level Caregiver Availability: 24/7 Discharge Plan Discussed with Primary Caregiver:  Yes Is Caregiver In Agreement  with Plan?: Yes Does Caregiver/Family have Issues with Lodging/Transportation while Pt is in Rehab?: No  Goals/Additional Needs Patient/Family Goal for Rehab: supervision to min assist with PT and OT Expected length of stay: ELOS 15-20 days Pt/Family Agrees to Admission and willing to participate: Yes Program Orientation Provided & Reviewed with Pt/Caregiver Including Roles & Responsibilities: Yes  Decrease burden of Care through IP rehab admission: n/a  Possible need for SNF placement upon discharge:not expected  Patient Condition: This patient's medical and functional status has changed since the consult dated: 06/08/2014 in which the Rehabilitation Physician determined and documented that the patient's condition is appropriate for intensive rehabilitative care in an inpatient rehabilitation facility. See "History of Present Illness" (above) for medical update. Functional changes are: overall max assist with transfers and adls. Patient's medical and functional status update has been discussed with the Rehabilitation physician and patient remains appropriate for inpatient rehabilitation. Will admit to inpatient rehab today.  Preadmission Screen Completed By: [name], 06/11/2014 9:55 AM ______________________________________________________________________  Discussed status with Dr. Naaman Plummer on 06/11/2014 at [phone] and received telephone approval for admission today.  Admission Coordinator: [name], time 1290 Date 06/11/2014          Cosigned by: Meredith Staggers, MD at 06/11/2014 10:14 AM  Revision History     Date/Time User Provider Type Action   06/11/2014 10:14 AM Meredith Staggers, MD Physician Cosign   06/11/2014 9:56 AM [name], RN Rehab Admission Coordinator Sign   06/11/2014 9:55 AM [name], RN Rehab Admission Coordinator Sign   View Details Report

## 2014-06-11 NOTE — Progress Notes (Signed)
Occupational Therapy Treatment Patient Details Name: Darrell Richardson MRN: [id number] DOB: 05-23-1940 Today's Date: 06/11/2014    History of present illness 74 y.o. M Hx diabetes type II uncontrolled, CHF, HTN, CKD, and CVA w/ residual Lt Hemiparesis who presented to [facility] complaining of shortness of breath. He was found to be in acute respiratory failure secondary to an acute CHF exacerbation, placed on BiPAP and sent to [facility] for further evaluation.   OT comments  This 74 yo male admitted with above presents to acute OT today making small gains with transfers. He is overall generally weak, but willing to try anything asked of him. He will greatly benefit from follow up OT on CIR.  Follow Up Recommendations  CIR    Equipment Recommendations   (TBD at next venue)       Precautions / Restrictions Precautions Precautions: Fall Precaution Comments: significant pain above right knee and Bil ankles (possibly reminants of gout flare up) Restrictions Weight Bearing Restrictions: No       Mobility Bed Mobility Overal bed mobility: Needs Assistance;+2 for physical assistance Bed Mobility: Supine to Sit     Supine to sit: Mod assist;HOB elevated     General bed mobility comments: pt move Bil LEs to EOB first and then cued to reach for left upper rail with Bil UEs and try to scoot around more, but could not so A'd pt with bed pad at which point he c/o pf excruciating pain above right knee--I lifted RUE back up to full extension and held it there for approximately 30 seconds then was able to lower it back down and help pt sit up to EOB with A to elevate trunk and scoot to EOB with pad  Transfers Overall transfer level: Needs assistance Equipment used: 2 person hand held assist (Bari stedy) Transfers: Sit to/from Stand (partial)           General transfer comment: First attemtp pt not able to get up and forward enough to get seat of [employer] aroudn  under him. On second attempt pt just barely got up and forward enough to clear buttocks enough from bed (raised) to get Jeralene Huff seat rotated around under him    Balance Overall balance assessment: Needs assistance Sitting-balance support: Bilateral upper extremity supported;Feet supported Sitting balance-Leahy Scale: Poor   Postural control: Posterior lean Standing balance support: Bilateral upper extremity supported Standing balance-Leahy Scale: Zero                     ADL Overall ADL's : Needs assistance/impaired                         Toilet Transfer: Moderate assistance;+2 for physical assistance (bari stedy)                              Cognition   Behavior During Therapy: WFL for tasks assessed/performed Overall Cognitive Status: Within Functional Limits for tasks assessed                                    Pertinent Vitals/ Pain       Pain Assessment: 0-10 Pain Score: 10-Worst pain ever Pain Location: above right knee and Bil ankles Pain Descriptors / Indicators: Tightness;Sharp;Aching Pain Intervention(s): Limited activity within patient's tolerance;Monitored during session;Patient requesting  pain meds-RN notified;RN gave pain meds during session;Repositioned         Frequency Min 2X/week     Progress Toward Goals  OT Goals(current goals can now be found in the care plan section)  Progress towards OT goals: Progressing toward goals     Plan Discharge plan remains appropriate    Co-evaluation    PT/OT/SLP Co-Evaluation/Treatment: Yes Reason for Co-Treatment: For patient/therapist safety;Complexity of the patient's impairments (multi-system involvement)   OT goals addressed during session: Strengthening/ROM      End of Session Equipment Utilized During Treatment: Gait belt Jeralene Huff)   Activity Tolerance Patient limited by pain   Patient Left in chair;with call bell/phone within reach;with  family/visitor present   Nurse Communication Mobility status;Need for lift equipment;Patient requests pain meds (Maxi move)        Time: 2035-5974 OT Time Calculation (min): 31 min  Charges: OT General Charges $OT Visit: 1 Procedure OT Treatments $Self Care/Home Management : 8-22 mins  Almon Register [phone] 06/11/2014, 9:18 AM

## 2014-06-11 NOTE — Progress Notes (Signed)
Discussed with Dr. Waldron Labs and we can plan admission of pt to inpt rehab today. I will make the arrangements. Pt and his wife are in agreement. [phone]

## 2014-06-11 NOTE — Progress Notes (Signed)
Patient is A/Ox4 and is a maximum assist. Maxi-move lift was used to help transport patient from the bed to the chair. Patient worked with PT this am. He had complaints right leg and knee pain and prn pain medication was given. Patient had orders to be transferred to impatient rehab. CT of Abdomen was done this afternoon. Called and gave report Dolores Lory, Therapist, sports. Patient was transferred to rehab in bed and was accompanied by his wife

## 2014-06-11 NOTE — Progress Notes (Signed)
Physical Therapy Treatment Patient Details Name: Darrell Richardson MRN: [id number] DOB: 10-21-1940 Today's Date: 06/11/2014    History of Present Illness 74 y.o. M Hx diabetes type II uncontrolled, CHF, HTN, CKD, and CVA w/ residual Lt Hemiparesis who presented to [facility] complaining of shortness of breath. He was found to be in acute respiratory failure secondary to an acute CHF exacerbation, placed on BiPAP and sent to [facility] for further evaluation. Pt with bilateral lower extremity ankle/foot pain with ?gout. Extensive work up completed.    PT Comments    Pt making slow, steady progress. Remains motivated.  Follow Up Recommendations  CIR     Equipment Recommendations  Other (comment) (to be assessed)    Recommendations for Other Services       Precautions / Restrictions Precautions Precautions: Fall Precaution Comments: significant pain above right knee and Bil ankles (possibly reminants of gout flare up) Restrictions Weight Bearing Restrictions: No    Mobility  Bed Mobility Overal bed mobility: Needs Assistance;+2 for physical assistance Bed Mobility: Supine to Sit     Supine to sit: Mod assist;HOB elevated     General bed mobility comments: pt move Bil LEs to EOB first and then cued to reach for left upper rail with Bil UEs and try to scoot around more, but could not so A'd pt with bed pad at which point he c/o pf excruciating pain above right knee--I lifted RUE back up to full extension and held it there for approximately 30 seconds then was able to lower it back down and help pt sit up to EOB with A to elevate trunk and scoot to EOB with pad  Transfers Overall transfer level: Needs assistance Equipment used: Ambulation equipment used (bariatric stedy) Transfers: Sit to/from Stand (partial) Sit to Stand: +2 physical assistance;Mod assist Stand pivot transfers: +2 physical assistance;Mod assist       General transfer comment: First  attemtp pt not able to get up and forward enough to get seat of Jeralene Huff aroudn under him. On second attempt pt just barely got up and forward enough to clear buttocks enough from bed (raised) to get Jeralene Huff seat rotated around under him. Pt unable to come to full stand. Unable to fully extend hips/knees.  Ambulation/Gait                 Stairs            Wheelchair Mobility    Modified Rankin (Stroke Patients Only)       Balance Overall balance assessment: Needs assistance Sitting-balance support: Bilateral upper extremity supported;Feet supported Sitting balance-Leahy Scale: Poor Sitting balance - Comments: Sat EOB x 5 minutes with min A initially. When holding stedy pt able to maintain with min guard. Postural control: Posterior lean Standing balance support: Bilateral upper extremity supported Standing balance-Leahy Scale: Zero                      Cognition Arousal/Alertness: Awake/alert Behavior During Therapy: WFL for tasks assessed/performed Overall Cognitive Status: Within Functional Limits for tasks assessed                      Exercises      General Comments        Pertinent Vitals/Pain Pain Assessment: 0-10 Pain Score: 10-Worst pain ever Pain Location: above right knee and Bil ankles Pain Descriptors / Indicators: Tightness;Sharp;Aching Pain Intervention(s): Limited activity within patient's tolerance;Monitored during session;Patient requesting  pain meds-RN notified;RN gave pain meds during session;Repositioned    Home Living                      Prior Function            PT Goals (current goals can now be found in the care plan section) Progress towards PT goals: Progressing toward goals    Frequency  Min 3X/week    PT Plan Current plan remains appropriate    Co-evaluation PT/OT/SLP Co-Evaluation/Treatment: Yes Reason for Co-Treatment: Complexity of the patient's impairments (multi-system  involvement);For patient/therapist safety PT goals addressed during session: Mobility/safety with mobility;Balance;Proper use of DME OT goals addressed during session: Strengthening/ROM     End of Session Equipment Utilized During Treatment: Gait belt;Other (comment) (Bariatric Stedy) Activity Tolerance: Patient tolerated treatment well Patient left: in chair;with call bell/phone within reach;with family/visitor present     Time: 0829-0900 PT Time Calculation (min) (ACUTE ONLY): 31 min  Charges:  $Gait Training: 8-22 mins                    G Codes:      Pamlea Finder 2014-06-13, 10:41 AM  Suanne Marker PT [phone]

## 2014-06-11 NOTE — Progress Notes (Addendum)
Advanced Heart Failure Rounding Note   Subjective:    Remains miserable. Sore all over. Very weak. Denies dyspnea.  TEE negative for endocarditis 2/29. EF 40-45%. Renal function recovering. Auto diuresing.  Weight down 15 pounds.   Still feels miserable. No significant response to steroids  RF, Parvo B10, ANCA, complement, bone scan, CK, SPEP/UPEP, TEE, Lyme, RMSF titers all negative to date. Skeletal survey negative.    Objective:   Weight Range:  Vital Signs:   Temp:  [97.8 F (36.6 C)-98.2 F (36.8 C)] 98.2 F (36.8 C) (03/04 0623) Pulse Rate:  [61-67] 61 (03/04 0944) Resp:  [17-18] 18 (03/04 0944) BP: (128-171)/(49-57) 130/50 mmHg (03/04 0944) SpO2:  [98 %-99 %] 98 % (03/04 0944) Weight:  [103.874 kg (229 lb)] 103.874 kg (229 lb) (03/04 0623) Last BM Date: 06/09/14  Weight change: Filed Weights   06/09/14 1201 06/10/14 0437 06/11/14 0623  Weight: 107.1 kg (236 lb 1.8 oz) 106.1 kg (233 lb 14.5 oz) 103.874 kg (229 lb)    Intake/Output:   Intake/Output Summary (Last 24 hours) at 06/11/14 1141 Last data filed at 06/11/14 1106  Gross per 24 hour  Intake    653 ml  Output   2550 ml  Net  -1897 ml     Physical Exam: General: Elderly lying in bed .No dyspnea HEENT: normal Neck: supple. JVP hard to see. Carotids 2+ bilat; no bruits. No lymphadenopathy or thryomegaly appreciated. Cor: PMI nondisplaced. Regular rate & rhythm. No rubs, gallops or murmurs. No S3.  Lungs: crackles in bases on 4 liters [place] Abdomen: soft, nontender, nondistended. No hepatosplenomegaly. No bruits or masses. Good bowel sounds. Extremities no cyanosis, clubbing, rash, R and LLE Tr edema. DPs trace bialterally Neuro: alert & orientedx3, cranial nerves grossly intact. LEs weak and sore  Telemetry:  SR   Labs: Basic Metabolic Panel:  Recent Labs Lab 06/05/14 0300 06/06/14 0230 06/07/14 0653 06/07/14 1820 06/08/14 0205 06/09/14 0309 06/10/14 0444 06/11/14 0400  NA 132* 130* 127*  126* 128* 132* 134* 134*  K 4.7 5.0 5.5* 5.6* 5.1 4.6 4.0 4.7  CL 96 94* 92* 93* 91* 96 97 101  CO2 23 19 23 21 22 21 25 25   GLUCOSE 184* 204* 191* 336* 345* 212* 161* 145*  BUN 84* 104* 126* 133* 140* 148* 145* 138*  CREATININE 5.56* 5.71* 5.59* 5.37* 5.27* 4.36* 4.02* 3.36*  CALCIUM 7.3* 7.3* 7.0* 6.8* 6.9* 7.1* 7.4* 7.5*  MG 2.3 2.4 2.7*  --   --   --   --   --   PHOS  --   --   --   --  8.0*  --   --  6.8*    Liver Function Tests:  Recent Labs Lab 06/05/14 0300 06/06/14 0230 06/07/14 0653 06/08/14 0205 06/10/14 0444 06/11/14 0400  AST 31 36 33  --  21  --   ALT 27 37 36  --  32  --   ALKPHOS 81 83 79  --  72  --   BILITOT 0.4 0.4 0.7  --  0.6  --   PROT 5.6* 5.7* 5.9*  --  5.5*  --   ALBUMIN 1.8* 1.9* 1.9* 1.9* 2.2* 2.3*   No results for input(s): LIPASE, AMYLASE in the last 168 hours. No results for input(s): AMMONIA in the last 168 hours.  CBC:  Recent Labs Lab 06/05/14 0300 06/06/14 0230 06/07/14 0653 06/08/14 0205 06/09/14 0309 06/11/14 0400  WBC 8.0 6.7 5.9 4.4 7.6 9.1  NEUTROABS 6.9 5.9 5.0  --   --   --   HGB 10.0* 10.1* 10.9* 9.6* 10.8* 11.8*  HCT 29.1* 29.3* 31.1* 27.7* 30.7* 34.0*  MCV 85.1 84.2 83.8 82.4 83.4 84.2  PLT 204 239 PLATELET CLUMPS NOTED ON SMEAR, COUNT APPEARS ADEQUATE 259 313 330    Cardiac Enzymes: No results for input(s): CKTOTAL, CKMB, CKMBINDEX, TROPONINI in the last 168 hours.  BNP: BNP (last 3 results) No results for input(s): BNP in the last 8760 hours.  ProBNP (last 3 results) No results for input(s): PROBNP in the last 8760 hours.    Other results:   Imaging: Dg Knee 1-2 Views Left  06/10/2014   CLINICAL DATA:  Left medial knee pain.  Trouble walking.  EXAM: LEFT KNEE - 1-2 VIEW  COMPARISON:  None.  FINDINGS: There is no evidence of fracture, dislocation, or joint effusion. There is no lytic or sclerotic osseous lesion. 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  There is mild patellofemoral osteoarthritis. There is peripheral vascular atherosclerotic disease. Soft tissues are unremarkable.  IMPRESSION: No acute osseous injury of the left knee.   Electronically Signed   By: Kathreen Devoid   On: 06/10/2014 18:37   Dg Knee 1-2 Views Right  06/10/2014   CLINICAL DATA:  Severe lower body pain. No known injury. Bilateral hip pain. Trouble walking. Right lateral knee pain. Left medial knee pain.  EXAM: RIGHT KNEE - 1-2 VIEW  COMPARISON:  None.  FINDINGS: No acute fracture or dislocation. Moderate joint effusion. Tricompartmental osteoarthritis of the right knee most severe in the medial femorotibial compartment. Chondrocalcinosis of the lateral femorotibial compartment as can be seen with CPPD. There is peripheral vascular atherosclerotic disease.  IMPRESSION: No acute osseous injury of the right knee.  Tricompartmental osteoarthritis of the right knee.   Electronically Signed   By: Kathreen Devoid   On: 06/10/2014 18:27   Dg Ankle Complete Left  06/10/2014   CLINICAL DATA:  Lower body pain.  Trouble walking.  EXAM: LEFT ANKLE COMPLETE - 3+ VIEW  COMPARISON:  None.  FINDINGS: There is no evidence of fracture, dislocation, or joint effusion. The ankle mortise is intact. There is a plantar calcaneal spur. There are enthesopathic changes at the Achilles tendon insertion. There are degenerative changes of the talonavicular joint. There is mild osteoarthritis of the tibiotalar joint. There is mild osteoarthritis of the first TMT joint. There is peripheral vascular atherosclerotic disease. Soft tissues are unremarkable.  IMPRESSION: 1. No acute osseous injury of the left ankle.   Electronically Signed   By: Kathreen Devoid   On: 06/10/2014 18:35   Dg Ankle Complete Right  06/10/2014   CLINICAL DATA:  Lower body pain.  Trouble walking.  EXAM: RIGHT ANKLE - COMPLETE 3+ VIEW  COMPARISON:  None.  FINDINGS: There is no evidence of fracture, dislocation, or joint effusion. There is a distal  fibular side plate with multiple interlocking screws without failure or complication. There is a medial malleolar screw and a medial malleolar K-wire without failure or complication. The ankle mortise is intact. There is a plantar calcaneal spur. There are enthesopathic changes at the Achilles tendon insertion. There are degenerative changes of the talonavicular joint. There is osteoarthritis of the tibiotalar joint. Soft tissues are unremarkable.  IMPRESSION: 1. No acute osseous injury of the right ankle. 2. 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   Electronically Signed   By: Kathreen Devoid  On: 06/10/2014 18:34   Mr Lumbar Spine Wo Contrast  06/09/2014   CLINICAL DATA:  Focal lower extremity weakness. History of generalized weakness. No acute back pain, bowel or bladder symptoms. Initial encounter.  EXAM: MRI LUMBAR SPINE WITHOUT CONTRAST  TECHNIQUE: Multiplanar, multisequence MR imaging of the lumbar spine was performed. No intravenous contrast was administered.  COMPARISON:  Abdominal pelvic CT 08/13/2013.  FINDINGS: The lumbar spine examination is mildly motion degraded, especially on the axial images. CT demonstrates 5 lumbar type vertebral bodies. The alignment is stable and near anatomic. The marrow signal within the spine is mildly heterogeneous without focally suspicious lesion. There are foci of decreased T1 and T2 signal in the left iliac bone without clear corresponding finding on prior CT.  The conus medullaris extends to the T12-L1 level and appears normal. No focal paraspinal abnormalities are identified. There is subcutaneous edema in the back.  No significant disc space findings are seen from T10-11 through L2-3. There are paraspinal osteophytes anteriorly in the lower thoracic spine.  L3-4: Mild annular disc bulging. There is facet and ligamentous hypertrophy with bilateral facet joint effusions. These factors contribute to mild spinal stenosis with mild narrowing of both  lateral recesses.  L4-5: Mild annular disc bulging. There is moderate facet and ligamentous hypertrophy with bilateral facet joint effusions. These factors contribute to moderate spinal stenosis with asymmetric narrowing of the right lateral recess and right foramen. There is mild subchondral edema within the facet joints without bone destruction or periarticular fluid collection.  L5-S1: Disc height and hydration are maintained. Mild bilateral facet hypertrophy. No spinal stenosis or nerve root encroachment.  IMPRESSION: 1. Moderate facet and ligamentous hypertrophy with facet joint effusions and subchondral edema bilaterally at L3-4 and L4-5. 2. Associated annular disc bulging at both levels, contributing to mild spinal stenosis at L3-4 and moderate spinal stenosis at L4-5. 3. Nonspecific marrow lesions within the left iliac bone. Early metastatic disease cannot be completely excluded. No suspicious lesions demonstrated within the lumbar spine.   Electronically Signed   By: Richardean Sale M.D.   On: 06/09/2014 17:06   Nm Bone Scan Whole Body  06/10/2014   CLINICAL DATA:  Lower extremity weakness and pain. History of diabetes, hypertension and stroke. Indeterminate lesions in left iliac bone on MRI. No given history of malignancy.  EXAM: NUCLEAR MEDICINE WHOLE BODY BONE SCAN  TECHNIQUE: Whole body anterior and posterior images were obtained approximately 3 hours after intravenous injection of radiopharmaceutical.  RADIOPHARMACEUTICALS:  25.0 mCi Technetium-99 MDP  COMPARISON:  Lumbar spine and pelvic MRI 06/09/2014.  FINDINGS: There is no osseous activity suspicious for metastatic disease. Specifically, no abnormal activity is seen within the left iliac bone or remainder of the pelvis. There is scattered arthropathic activity within the large joints, primarily the shoulders, elbows and knees. There is also some arthropathic activity throughout the hands and feet bilaterally. Activity is present within the Foley  catheter bag between lower legs.  IMPRESSION: No bone scan evidence of osseous metastatic disease. Scattered arthropathic activity.   Electronically Signed   By: Richardean Sale M.D.   On: 06/10/2014 15:42   Mr Sacrum/si Joints Wo Contrast  06/09/2014   CLINICAL DATA:  Focal lower extremity weakness. History of generalized weakness. No acute back pain, bowel or bladder symptoms. Initial encounter.  EXAM: MR SACRUM WITHOUT CONTRAST  TECHNIQUE: Multiplanar, multisequence MR imaging was performed. No intravenous contrast was administered.  COMPARISON:  Pelvic CT 08/13/2013.  FINDINGS: Lumbar spine findings dictated separately.  No evidence of sacral fracture or destruction. The sacroiliac joints are intact without erosive change. There is no presacral fluid collection. There is no sacral nerve foraminal compromise or nerve root encroachment.  There is mildly heterogeneous marrow signal within the sacrum and iliac bones on the T1 weighted images. No suspicious lesions demonstrated on T2 or inversion recovery imaging.  Foley catheter is in place. The bladder is decompressed and demonstrates mild wall thickening. There is nodularity throughout the peripheral and central portions of the prostate gland. Asymmetric fat in the right inguinal canal is incompletely visualized, although grossly stable. There is nonspecific subcutaneous edema dependently in the buttocks as well as mild perirectal edema.  IMPRESSION: 1. Negative MRI of the sacrum and sacroiliac joints. 2. No evidence of sacral nerve root encroachment. 3. Lumbar spine findings dictated separately.   Electronically Signed   By: Richardean Sale M.D.   On: 06/09/2014 16:54   Dg Hips Bilat With Pelvis 3-4 Views  06/10/2014   CLINICAL DATA:  Lower body pain. Trouble walking. Bilateral hip pain.  EXAM: BILATERAL HIP (WITH PELVIS) 3-4 VIEWS  COMPARISON:  None.  FINDINGS: No fracture or dislocation. Joint spaces are maintained. No lytic or sclerotic osseous lesion.  Peripheral vascular atherosclerotic disease.  IMPRESSION: No acute osseous injury of bilateral hips.   Electronically Signed   By: Kathreen Devoid   On: 06/10/2014 18:38     Medications:     Scheduled Medications: . antiseptic oral rinse  7 mL Mouth Rinse BID  . clopidogrel  75 mg Oral Daily  . doxycycline  100 mg Oral Q12H  . heparin  5,000 Units Subcutaneous 3 times per day  . hydrALAZINE  5 mg Oral BID  . insulin aspart  0-20 Units Subcutaneous TID WC  . insulin glargine  18 Units Subcutaneous Daily  . isosorbide mononitrate  15 mg Oral Daily  . methylPREDNISolone (SOLU-MEDROL) injection  125 mg Intravenous Q24H  . [START ON 06/12/2014] predniSONE  40 mg Oral Q breakfast   Followed by  . [START ON 06/13/2014] predniSONE  30 mg Oral Q breakfast   Followed by  . [START ON 06/14/2014] predniSONE  20 mg Oral Q breakfast   Followed by  . [START ON 06/15/2014] predniSONE  10 mg Oral Q breakfast  . pregabalin  75 mg Oral Daily  . psyllium  1 packet Oral QHS  . sertraline  50 mg Oral QHS  . sodium chloride  3 mL Intravenous Q12H    Infusions:    PRN Medications: acetaminophen **OR** acetaminophen, diphenhydrAMINE, HYDROcodone-acetaminophen, hydrocortisone cream, ipratropium-albuterol, morphine injection, ondansetron (ZOFRAN) IV, oxymetazoline   Assessment:   1. A/C Systolic HF    --EF 84-16% 2. Acute Respiratory Failure- on Bipap now weaned to nasal cannula.  3. Hypertensive crisis 4. DM 5. Acute on CKD, stage IV - Creatinine baseline 2.5 6. 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 Chest Pain 1 week ago 8. Fever - ? Acute bronchitis vs flu 9. Acute gout - uric acid 8.3 10. Hyponatremia 11. Joint/muscle aches with ESR 135    -CK, RF, complement, ASO, blood cx negative.     - ANCA, lyme titers, SPEP/UPEP, CCP, parvo B19 (IgM), bones scan, skeletal survey negative   Plan/Discussion:    Stable from HF perspective. Renal function improving and diuresing well . Resume low-dose b-blocker and  increase hydralazine. No ACE/ARB due to renal failure  On going work-up for fevers, severe joint and muscle aches with ESR 135 has been completely unrevealing. No clinical  response to steroids though ESR down to 28.  I spoke to Drs. Margarette Canada in Rheumatology who have reviewed case for [place] but in light of lack of clincial response to steroids they feel that this is likely not a primary immunologic process. They expressed concern about underlying malignancy. Will get CT C/A/P (no contrast) to further evaluate and complete the work-up.   If scan negative can either go to CIR and see if he will improve or consider transfer to tertiary center.   I will follow at a distance.     Dewayne Severe,MD 11:41 AM

## 2014-06-11 NOTE — Progress Notes (Signed)
Patient ID: Darrell Richardson, male   DOB: 21-Jan-1941, 74 y.o.   MRN: [id number] S:complaining of ankle pain O:BP 130/50 mmHg  Pulse 61  Temp(Src) 98.2 F (36.8 C) (Oral)  Resp 18  Ht 5\' 8"  (1.727 m)  Wt 103.874 kg (229 lb)  BMI 34.83 kg/m2  SpO2 98%  Intake/Output Summary (Last 24 hours) at 06/11/14 1224 Last data filed at 06/11/14 1106  Gross per 24 hour  Intake    653 ml  Output   2550 ml  Net  -1897 ml   Intake/Output: I/O last 3 completed shifts: In: 3419 [P.O.:1390; I.V.:13] Out: 4050 [Urine:4050]  Intake/Output this shift:  Total I/O In: 70 [P.O.:60; I.V.:10] Out: 425 [Urine:425] Weight change: -3.226 kg (-7 lb 1.8 oz) Gen:WD WN WM in mild discomfort CVS:no rub Resp:cta FXT:KWIOXB Ext:tr edema   Recent Labs Lab 06/05/14 0300 06/06/14 0230 06/07/14 0653 06/07/14 1820 06/08/14 0205 06/09/14 0309 06/10/14 0444 06/11/14 0400  NA 132* 130* 127* 126* 128* 132* 134* 134*  K 4.7 5.0 5.5* 5.6* 5.1 4.6 4.0 4.7  CL 96 94* 92* 93* 91* 96 97 101  CO2 23 19 23 21 22 21 25 25   GLUCOSE 184* 204* 191* 336* 345* 212* 161* 145*  BUN 84* 104* 126* 133* 140* 148* 145* 138*  CREATININE 5.56* 5.71* 5.59* 5.37* 5.27* 4.36* 4.02* 3.36*  ALBUMIN 1.8* 1.9* 1.9*  --  1.9*  --  2.2* 2.3*  CALCIUM 7.3* 7.3* 7.0* 6.8* 6.9* 7.1* 7.4* 7.5*  PHOS  --   --   --   --  8.0*  --   --  6.8*  AST 31 36 33  --   --   --  21  --   ALT 27 37 36  --   --   --  32  --    Liver Function Tests:  Recent Labs Lab 06/06/14 0230 06/07/14 0653 06/08/14 0205 06/10/14 0444 06/11/14 0400  AST 36 33  --  21  --   ALT 37 36  --  32  --   ALKPHOS 83 79  --  72  --   BILITOT 0.4 0.7  --  0.6  --   PROT 5.7* 5.9*  --  5.5*  --   ALBUMIN 1.9* 1.9* 1.9* 2.2* 2.3*   No results for input(s): LIPASE, AMYLASE in the last 168 hours. No results for input(s): AMMONIA in the last 168 hours. CBC:  Recent Labs Lab 06/05/14 0300 06/06/14 0230 06/07/14 0653 06/08/14 0205 06/09/14 0309 06/11/14 0400   WBC 8.0 6.7 5.9 4.4 7.6 9.1  NEUTROABS 6.9 5.9 5.0  --   --   --   HGB 10.0* 10.1* 10.9* 9.6* 10.8* 11.8*  HCT 29.1* 29.3* 31.1* 27.7* 30.7* 34.0*  MCV 85.1 84.2 83.8 82.4 83.4 84.2  PLT 204 239 PLATELET CLUMPS NOTED ON SMEAR, COUNT APPEARS ADEQUATE 259 313 330   Cardiac Enzymes: No results for input(s): CKTOTAL, CKMB, CKMBINDEX, TROPONINI in the last 168 hours. CBG:  Recent Labs Lab 06/10/14 1130 06/10/14 1631 06/10/14 2118 06/11/14 0619 06/11/14 1045  GLUCAP 145* 269* 233* 125* 163*    Iron Studies: No results for input(s): IRON, TIBC, TRANSFERRIN, FERRITIN in the last 72 hours. Studies/Results: Dg Knee 1-2 Views Left  06/10/2014   CLINICAL DATA:  Left medial knee pain.  Trouble walking.  EXAM: LEFT KNEE - 1-2 VIEW  COMPARISON:  None.  FINDINGS: There is no evidence of fracture, dislocation, or joint effusion. There is  no lytic or sclerotic osseous lesion. 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 There is mild patellofemoral osteoarthritis. There is peripheral vascular atherosclerotic disease. Soft tissues are unremarkable.  IMPRESSION: No acute osseous injury of the left knee.   Electronically Signed   By: Kathreen Devoid   On: 06/10/2014 18:37   Dg Knee 1-2 Views Right  06/10/2014   CLINICAL DATA:  Severe lower body pain. No known injury. Bilateral hip pain. Trouble walking. Right lateral knee pain. Left medial knee pain.  EXAM: RIGHT KNEE - 1-2 VIEW  COMPARISON:  None.  FINDINGS: No acute fracture or dislocation. Moderate joint effusion. Tricompartmental osteoarthritis of the right knee most severe in the medial femorotibial compartment. Chondrocalcinosis of the lateral femorotibial compartment as can be seen with CPPD. There is peripheral vascular atherosclerotic disease.  IMPRESSION: No acute osseous injury of the right knee.  Tricompartmental osteoarthritis of the right knee.   Electronically Signed   By: Kathreen Devoid   On:  06/10/2014 18:27   Dg Ankle Complete Left  06/10/2014   CLINICAL DATA:  Lower body pain.  Trouble walking.  EXAM: LEFT ANKLE COMPLETE - 3+ VIEW  COMPARISON:  None.  FINDINGS: There is no evidence of fracture, dislocation, or joint effusion. The ankle mortise is intact. There is a plantar calcaneal spur. There are enthesopathic changes at the Achilles tendon insertion. There are degenerative changes of the talonavicular joint. There is mild osteoarthritis of the tibiotalar joint. There is mild osteoarthritis of the first TMT joint. There is peripheral vascular atherosclerotic disease. Soft tissues are unremarkable.  IMPRESSION: 1. No acute osseous injury of the left ankle.   Electronically Signed   By: Kathreen Devoid   On: 06/10/2014 18:35   Dg Ankle Complete Right  06/10/2014   CLINICAL DATA:  Lower body pain.  Trouble walking.  EXAM: RIGHT ANKLE - COMPLETE 3+ VIEW  COMPARISON:  None.  FINDINGS: There is no evidence of fracture, dislocation, or joint effusion. There is a distal fibular side plate with multiple interlocking screws without failure or complication. There is a medial malleolar screw and a medial malleolar K-wire without failure or complication. The ankle mortise is intact. There is a plantar calcaneal spur. There are enthesopathic changes at the Achilles tendon insertion. There are degenerative changes of the talonavicular joint. There is osteoarthritis of the tibiotalar joint. Soft tissues are unremarkable.  IMPRESSION: 1. No acute osseous injury of the right ankle. 2. 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   Electronically Signed   By: Kathreen Devoid   On: 06/10/2014 18:34   Mr Lumbar Spine Wo Contrast  06/09/2014   CLINICAL DATA:  Focal lower extremity weakness. History of generalized weakness. No acute back pain, bowel or bladder symptoms. Initial encounter.  EXAM: MRI LUMBAR SPINE WITHOUT CONTRAST  TECHNIQUE: Multiplanar, multisequence MR imaging of the lumbar spine was  performed. No intravenous contrast was administered.  COMPARISON:  Abdominal pelvic CT 08/13/2013.  FINDINGS: The lumbar spine examination is mildly motion degraded, especially on the axial images. CT demonstrates 5 lumbar type vertebral bodies. The alignment is stable and near anatomic. The marrow signal within the spine is mildly heterogeneous without focally suspicious lesion. There are foci of decreased T1 and T2 signal in the left iliac bone without clear corresponding finding on prior CT.  The conus medullaris extends to the T12-L1 level and appears normal. No focal paraspinal abnormalities are identified. There is subcutaneous edema in  the back.  No significant disc space findings are seen from T10-11 through L2-3. There are paraspinal osteophytes anteriorly in the lower thoracic spine.  L3-4: Mild annular disc bulging. There is facet and ligamentous hypertrophy with bilateral facet joint effusions. These factors contribute to mild spinal stenosis with mild narrowing of both lateral recesses.  L4-5: Mild annular disc bulging. There is moderate facet and ligamentous hypertrophy with bilateral facet joint effusions. These factors contribute to moderate spinal stenosis with asymmetric narrowing of the right lateral recess and right foramen. There is mild subchondral edema within the facet joints without bone destruction or periarticular fluid collection.  L5-S1: Disc height and hydration are maintained. Mild bilateral facet hypertrophy. No spinal stenosis or nerve root encroachment.  IMPRESSION: 1. Moderate facet and ligamentous hypertrophy with facet joint effusions and subchondral edema bilaterally at L3-4 and L4-5. 2. Associated annular disc bulging at both levels, contributing to mild spinal stenosis at L3-4 and moderate spinal stenosis at L4-5. 3. Nonspecific marrow lesions within the left iliac bone. Early metastatic disease cannot be completely excluded. No suspicious lesions demonstrated within the  lumbar spine.   Electronically Signed   By: Richardean Sale M.D.   On: 06/09/2014 17:06   Nm Bone Scan Whole Body  06/10/2014   CLINICAL DATA:  Lower extremity weakness and pain. History of diabetes, hypertension and stroke. Indeterminate lesions in left iliac bone on MRI. No given history of malignancy.  EXAM: NUCLEAR MEDICINE WHOLE BODY BONE SCAN  TECHNIQUE: Whole body anterior and posterior images were obtained approximately 3 hours after intravenous injection of radiopharmaceutical.  RADIOPHARMACEUTICALS:  25.0 mCi Technetium-99 MDP  COMPARISON:  Lumbar spine and pelvic MRI 06/09/2014.  FINDINGS: There is no osseous activity suspicious for metastatic disease. Specifically, no abnormal activity is seen within the left iliac bone or remainder of the pelvis. There is scattered arthropathic activity within the large joints, primarily the shoulders, elbows and knees. There is also some arthropathic activity throughout the hands and feet bilaterally. Activity is present within the Foley catheter bag between lower legs.  IMPRESSION: No bone scan evidence of osseous metastatic disease. Scattered arthropathic activity.   Electronically Signed   By: Richardean Sale M.D.   On: 06/10/2014 15:42   Mr Sacrum/si Joints Wo Contrast  06/09/2014   CLINICAL DATA:  Focal lower extremity weakness. History of generalized weakness. No acute back pain, bowel or bladder symptoms. Initial encounter.  EXAM: MR SACRUM WITHOUT CONTRAST  TECHNIQUE: Multiplanar, multisequence MR imaging was performed. No intravenous contrast was administered.  COMPARISON:  Pelvic CT 08/13/2013.  FINDINGS: Lumbar spine findings dictated separately.  No evidence of sacral fracture or destruction. The sacroiliac joints are intact without erosive change. There is no presacral fluid collection. There is no sacral nerve foraminal compromise or nerve root encroachment.  There is mildly heterogeneous marrow signal within the sacrum and iliac bones on the T1  weighted images. No suspicious lesions demonstrated on T2 or inversion recovery imaging.  Foley catheter is in place. The bladder is decompressed and demonstrates mild wall thickening. There is nodularity throughout the peripheral and central portions of the prostate gland. Asymmetric fat in the right inguinal canal is incompletely visualized, although grossly stable. There is nonspecific subcutaneous edema dependently in the buttocks as well as mild perirectal edema.  IMPRESSION: 1. Negative MRI of the sacrum and sacroiliac joints. 2. No evidence of sacral nerve root encroachment. 3. Lumbar spine findings dictated separately.   Electronically Signed   By: Caryl Comes.D.  On: 06/09/2014 16:54   Dg Hips Bilat With Pelvis 3-4 Views  06/10/2014   CLINICAL DATA:  Lower body pain. Trouble walking. Bilateral hip pain.  EXAM: BILATERAL HIP (WITH PELVIS) 3-4 VIEWS  COMPARISON:  None.  FINDINGS: No fracture or dislocation. Joint spaces are maintained. No lytic or sclerotic osseous lesion. Peripheral vascular atherosclerotic disease.  IMPRESSION: No acute osseous injury of bilateral hips.   Electronically Signed   By: Kathreen Devoid   On: 06/10/2014 18:38   . antiseptic oral rinse  7 mL Mouth Rinse BID  . clopidogrel  75 mg Oral Daily  . doxycycline  100 mg Oral Q12H  . heparin  5,000 Units Subcutaneous 3 times per day  . hydrALAZINE  12.5 mg Oral BID  . insulin aspart  0-20 Units Subcutaneous TID WC  . insulin glargine  18 Units Subcutaneous Daily  . [START ON 06/12/2014] isosorbide mononitrate  30 mg Oral Daily  . methylPREDNISolone (SOLU-MEDROL) injection  125 mg Intravenous Q24H  . metoprolol succinate  25 mg Oral BID  . [START ON 06/12/2014] predniSONE  40 mg Oral Q breakfast   Followed by  . [START ON 06/13/2014] predniSONE  30 mg Oral Q breakfast   Followed by  . [START ON 06/14/2014] predniSONE  20 mg Oral Q breakfast   Followed by  . [START ON 06/15/2014] predniSONE  10 mg Oral Q breakfast  .  pregabalin  75 mg Oral Daily  . psyllium  1 packet Oral QHS  . sertraline  50 mg Oral QHS  . sodium chloride  3 mL Intravenous Q12H    BMET    Component Value Date/Time   NA 134* 06/11/2014 0400   K 4.7 06/11/2014 0400   CL 101 06/11/2014 0400   CO2 25 06/11/2014 0400   GLUCOSE 145* 06/11/2014 0400   BUN 138* 06/11/2014 0400   CREATININE 3.36* 06/11/2014 0400   CALCIUM 7.5* 06/11/2014 0400   GFRNONAA 17* 06/11/2014 0400   GFRAA 19* 06/11/2014 0400   CBC    Component Value Date/Time   WBC 9.1 06/11/2014 0400   RBC 4.04* 06/11/2014 0400   HGB 11.8* 06/11/2014 0400   HCT 34.0* 06/11/2014 0400   PLT 330 06/11/2014 0400   MCV 84.2 06/11/2014 0400   MCH 29.2 06/11/2014 0400   MCHC 34.7 06/11/2014 0400   RDW 12.6 06/11/2014 0400   LYMPHSABS 0.3* 06/07/2014 0653   MONOABS 0.6 06/07/2014 0653   EOSABS 0.0 06/07/2014 0653   BASOSABS 0.0 06/07/2014 0653     Assessment/Plan:  1. AKI/CKD- in setting of decompensated CHF and hypotension. Non-oliguric. BUN rising out of proportion to Scr,presumably due to prednisone therapy and diuresis. Will need to follow closely for any signs of uremia. Discussed the chronicity and severity of his CKD with he and his wife. He would be willing to proceed with HD if indicated 1. Scr actually continues to slowly improve.  2. Hyperkalemia- resolved 3. Hyponatremia- as above, pt with edema, and improved after IV Lasix x 1 and continues to improve with returning renal function and auto-diuresis. 4. Acute on chronic systolic CHF- improved edema and weight without more lasix at this point. Continue to follow. 5. Lower extremity weakness- PT/OT. Workup per Cards and Primary svc 1. MRI of LS spine revealed some bulging disc and some spinal stenosis. Also concern over bone marrow abnormalities. awaiting Neuro evaluation 6. Severe protein malnutrition 7. SHPTH- await repeat Phos levels and iPTH but will likely require phosphate  binders. 8.  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 Arthralgias/myalgias- possible RA, also possible PMR, no significant improvement with steroids. negative TEE to r/oSBE.  1. Negative anti-histone DNA, RMSF, +parvovirus B19 IgG (neg IgM) 2. R/o paraneoplastic syndrome 3. Focus on pain relief so he can cooperate with PT 4. Consider pain management consult [place] [facility]

## 2014-06-12 ENCOUNTER — Inpatient Hospital Stay (HOSPITAL_COMMUNITY): Payer: Medicare Other | Admitting: Physical Therapy

## 2014-06-12 ENCOUNTER — Inpatient Hospital Stay (HOSPITAL_COMMUNITY): Payer: Medicare Other | Admitting: *Deleted

## 2014-06-12 DIAGNOSIS — R972 Elevated prostate specific antigen [PSA]: Secondary | ICD-10-CM | POA: Diagnosis present

## 2014-06-12 DIAGNOSIS — R700 Elevated erythrocyte sedimentation rate: Secondary | ICD-10-CM | POA: Diagnosis present

## 2014-06-12 DIAGNOSIS — I5042 Chronic combined systolic (congestive) and diastolic (congestive) heart failure: Secondary | ICD-10-CM | POA: Diagnosis present

## 2014-06-12 DIAGNOSIS — M791 Myalgia: Secondary | ICD-10-CM

## 2014-06-12 LAB — PSA: PSA: 9.4 ng/mL — ABNORMAL HIGH (ref <=4.00)

## 2014-06-12 LAB — URINALYSIS, ROUTINE W REFLEX MICROSCOPIC
BILIRUBIN URINE: NEGATIVE
GLUCOSE, UA: 100 mg/dL — AB
Ketones, ur: NEGATIVE mg/dL
Nitrite: POSITIVE — AB
PH: 5.5 (ref 5.0–8.0)
Protein, ur: >300 mg/dL — AB
Specific Gravity, Urine: 1.016 (ref 1.005–1.030)
UROBILINOGEN UA: 0.2 mg/dL (ref 0.0–1.0)

## 2014-06-12 LAB — RENAL FUNCTION PANEL
ANION GAP: 5 (ref 5–15)
Albumin: 2.3 g/dL — ABNORMAL LOW (ref 3.5–5.2)
BUN: 111 mg/dL — ABNORMAL HIGH (ref 6–23)
CALCIUM: 7.9 mg/dL — AB (ref 8.4–10.5)
CHLORIDE: 101 mmol/L (ref 96–112)
CO2: 28 mmol/L (ref 19–32)
CREATININE: 2.71 mg/dL — AB (ref 0.50–1.35)
GFR calc non Af Amer: 22 mL/min — ABNORMAL LOW (ref >90)
GFR, EST AFRICAN AMERICAN: 25 mL/min — AB (ref >90)
Glucose, Bld: 176 mg/dL — ABNORMAL HIGH (ref 70–99)
Phosphorus: 5.2 mg/dL — ABNORMAL HIGH (ref 2.3–4.6)
Potassium: 4.8 mmol/L (ref 3.5–5.1)
Sodium: 134 mmol/L — ABNORMAL LOW (ref 135–145)

## 2014-06-12 LAB — URINE MICROSCOPIC-ADD ON

## 2014-06-12 LAB — GLUCOSE, CAPILLARY
Glucose-Capillary: 170 mg/dL — ABNORMAL HIGH (ref 70–99)
Glucose-Capillary: 133 mg/dL — ABNORMAL HIGH (ref 70–99)
Glucose-Capillary: 166 mg/dL — ABNORMAL HIGH (ref 70–99)
Glucose-Capillary: 215 mg/dL — ABNORMAL HIGH (ref 70–99)

## 2014-06-12 MED ORDER — AMOXICILLIN-POT CLAVULANATE 875-125 MG PO TABS: 1.0000 | ORAL_TABLET | Freq: Two times a day (BID) | ORAL | Status: DC

## 2014-06-12 MED ORDER — METHOCARBAMOL 500 MG PO TABS: 750.0000 mg | ORAL_TABLET | Freq: Four times a day (QID) | ORAL | Status: DC | PRN

## 2014-06-12 MED ORDER — METOPROLOL TARTRATE 25 MG PO TABS
25.0000 mg | ORAL_TABLET | Freq: Two times a day (BID) | ORAL | Status: DC
  Filled 2014-06-12 (×2): qty 1

## 2014-06-12 MED ORDER — PREDNISONE 20 MG PO TABS
40.0000 mg | ORAL_TABLET | Freq: Every day | ORAL | Status: DC
  Administered 2014-06-13: 40 mg via ORAL
  Filled 2014-06-12 (×2): qty 2

## 2014-06-12 MED ORDER — ALLOPURINOL 100 MG PO TABS
100.0000 mg | ORAL_TABLET | Freq: Every day | ORAL | Status: DC
  Administered 2014-06-12 – 2014-06-23 (×12): 100 mg via ORAL
  Filled 2014-06-12 (×13): qty 1

## 2014-06-12 MED ORDER — INSULIN GLARGINE 100 UNIT/ML ~~LOC~~ SOLN
22.0000 [IU] | Freq: Every day | SUBCUTANEOUS | Status: DC
  Administered 2014-06-13 – 2014-06-17 (×5): 22 [IU] via SUBCUTANEOUS
  Filled 2014-06-12 (×6): qty 0.22

## 2014-06-12 MED ORDER — METHYLPREDNISOLONE SODIUM SUCC 125 MG IJ SOLR
125.0000 mg | INTRAMUSCULAR | Status: DC
  Administered 2014-06-12: 125 mg via INTRAVENOUS

## 2014-06-12 MED ORDER — COLCHICINE 0.6 MG PO TABS
0.3000 mg | ORAL_TABLET | Freq: Every day | ORAL | Status: DC
  Administered 2014-06-12: 0.3 mg via ORAL
  Filled 2014-06-12 (×4): qty 0.5

## 2014-06-12 MED ORDER — POLYETHYLENE GLYCOL 3350 17 G PO PACK
17.0000 g | PACK | Freq: Every day | ORAL | Status: DC | PRN
Start: 2014-06-12 — End: 2014-06-23
  Administered 2014-06-14 – 2014-06-17 (×2): 17 g via ORAL
  Filled 2014-06-12 (×3): qty 1

## 2014-06-12 MED ORDER — HYDRALAZINE HCL 25 MG PO TABS
25.0000 mg | ORAL_TABLET | Freq: Three times a day (TID) | ORAL | Status: DC
  Filled 2014-06-12 (×3): qty 1

## 2014-06-12 NOTE — Progress Notes (Signed)
Orthopedic Tech Progress Note Patient Details:  Darrell Richardson 06/08/1940 [id number]  Ortho Devices Type of Ortho Device: Abdominal binder Ortho Device/Splint Location: abdomen Ortho Device/Splint Interventions: Loanne Drilling, Abdi Husak 06/12/2014, 11:51 AM

## 2014-06-12 NOTE — Progress Notes (Signed)
Pt working with PT when he complained of dizziness and became lethargic Pt initial systolic BP was in 375 range, went down to 130 with sitting for ~15 minutes , had BM and then foley removal got up again to sitting then started complaining as above  Pt is awake but is mildly lethargic, denies CP or SOB No respiratory distress HR 50-55 Regular Skin color at baseline  BP rechecked 115/59  Pt felt better in supine  WIll order bedside therapy, anticipate that pt will be very slow to mobilize IM on consult,  Add TEDs and ABD binder  CHeck orthostatic vitals qshift

## 2014-06-12 NOTE — Progress Notes (Addendum)
Nursing Note: Received report on pt from day shift. Pt arrived on unit for admission prior to shift change.Pt resting quietly in bed.wbb

## 2014-06-12 NOTE — Consult Note (Signed)
[facility] Consultation Note                                                                                    Darrell Richardson, is a 74 y.o. male  MRN: [id number]   DOB - 1940-11-27  Admit Date - 06/11/2014  Outpatient Primary MD for the patient is [name], MD   Requesting physician: Dr Letta Pate  Reason for consultation: Management of complex and multiple medical problems  With History of -  Past Medical History  Diagnosis Date  . Hearing loss   . Gout   . Diabetes   . Fatigue   . Hypertension   . Stroke     X 2  . Heart failure   . Muscle pain   . Hammertoe       Past Surgical History  Procedure Laterality Date  . Stomach surgery    . Foot surgery Right   . Tee without cardioversion N/A 06/07/2014    Procedure: TRANSESOPHAGEAL ECHOCARDIOGRAM (TEE);  Surgeon: Jolaine Artist, MD;  Location: [facility] ENDOSCOPY;  Service: Cardiovascular;  Laterality: N/A;   Chief complaint : Admitted initially 2/22 for acute combined systolic and diastolic heart exacerbation, later developed problems related to weakness and polyarthralgias with associated tissue syndrome and severe deconditioning requiring admission to CIR on 3/4.   HPI Darrell Richardson  is a 74 y.o. 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 He had initially presented to the [facility] regional hospital reporting shortness of breath and because of lack of bed availability at that facility the patient was transferred to [facility] on 05/31/14. At presentation in addition to complaining of shortness of breath he also complained of orthopnea, increased lower extremity edema with sleep disturbance. He was admitted with a diagnosis of acute heart failure decompensation. He required BiPAP so he was admitted to the stepdown unit. He had a protracted hospitalization and was eventually discharged to CIR continued  rehabilitative therapy related to profound deconditioning.  Cardiology was consulted during the hospitalization to assist with management of his heart failure. He eventually diuresed 15 pounds. Because of his chronic kidney disease which seem to have progressed nephrology was consulted. The patient had initially told providers he would not wish to pursue dialysis but on 3/4 the nephrologist documented that the patient would be willing to proceed with hemodialysis if indicated. During this admission the patient endorsed that he felt like both of his legs became acutely weak but also had complaints related to back discomfort and increased gout pain in both toes. Patient had unable to participate with physical therapy because he was barely able to move his legs. He had denied numbness, tingling, neck pain or trauma. Because of this neurology was consulted. An ESR was checked which was markedly elevated at 135. Because patient was complaining of a combination of arthralgias and myalgias with associated fever the differential diagnosis list was quite long and included polymyalgia rheumatica versus rheumatoid arthritis versus hydralazine induced lupus as well as consideration given to tickborne illnesses or other infectious causes. RF, Parvo B10,  ANCA, complement, CK, SPEP/UPEP, Lyme, RMSF titers all negative to date. He underwent MRI of the lumbar spine and sacrum revealed a nonspecific marrow lesion within the left iliac bone that possibly represented early metastatic disease but whole-body bone scan was negative. Plain x-rays of the hips knees and ankles also were unrevealing for cause to fever or pain. .There was also concern that he may have had endocarditis so he underwent a TEE on 2/29 which was negative for findings for endocarditis and demonstrated an EF of 40-45%. A moderate size PFO with left-to-right shunt at rest was also found.   Because of his lack of progress with physical therapy and continued  debility he was felt to be an appropriate candidate for continued aggressive therapies in CIR. At time of discharge it was felt that patient's most current symptoms regarding the foot pain most likely related to gout as his uric acid was also elevated (orthopedics had also been consulted and postulated this diagnosis as well). The patient was resumed back on allopurinol and low-dose colchicine with close monitoring of his renal function commended. Unfortunately cannot utilize NSAIDs because of severe chronic kidney disease. Was recommended to also continue the current Solu-Medrol.   Also prior to discharge to the rehabilitation unit the previous attending physician had discussed patient's case with rheumatological services at [facility] as well as [facility]; at that time  etiology to patient's symptoms was determined to be unclear but it was recommended that the patient follow-up with either Dr. Martinique or Dr. Well after discharge. Dr.Bensimhon spoke with Dr. Amil Amen and Ouida Sills in rheumatology who also reviewed the case. Given the patient's lack of clinical response to steroids his symptomatology was likely not a primary immunological process. They expressed some concern regarding possible underlying malignancy. 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 Because of earlier expressed concern of possible paraneoplastic syndrome contributing to the patient's polyarthralgias a PSA was ordered and was pending at time of discharge to CIR.  Review of Systems   In addition to the HPI above,  No Headache, changes with Vision or hearing No problems swallowing food or Liquids, indigestion/reflux No recurrence of Shortness of Breath, palpitations, orthopnea or DOE No Abdominal pain, N/V; no melena or hematochezia, no dark tarry stools, recent issues related to constipation that has responded to laxatives  No  dysuria, hematuria or flank pain; planning of discomfort from Foley catheter No new skin rashes, lesions, masses or bruises, No significant change in joints pains-aches; reporting issues with quivering sensation extending from ankle to upper thigh that waxes and wanes No recent weight gain or loss No polyuria, polydypsia or polyphagia,  *A full 10 point Review of Systems was done, except as stated above, all other Review of Systems were negative.  Social History History  Substance Use Topics  . Smoking status: Former Research scientist (life sciences)  . Smokeless tobacco: Not on file  . Alcohol Use: Yes    Family History Family History  Problem Relation Age of Onset  . Dementia Mother     Prior to Admission medications   Medication Sig Start Date End Date Taking? Authorizing Provider  allopurinol (ZYLOPRIM) 100 MG tablet Take 1 tablet (100 mg total) by mouth daily. 06/11/14   Dawood Elgergawy, MD  amLODipine (NORVASC) 5 MG tablet Take 5 mg by mouth 2 (two) times daily.    Historical Provider, MD  Cholecalciferol (VITAMIN D3) 1000 UNITS CAPS Take 1,000 Units  by mouth daily.    Historical Provider, MD  clopidogrel (PLAVIX) 75 MG tablet Take 75 mg by mouth daily with breakfast.    Historical Provider, MD  colchicine 0.6 MG tablet Take 0.5 tablets (0.3 mg total) by mouth daily. 06/12/14   Phillips Climes, MD  diphenhydrAMINE (BENADRYL) 25 mg capsule Take 1 capsule (25 mg total) by mouth every 6 (six) hours as needed for itching. 06/11/14   Phillips Climes, MD  docusate sodium (COLACE) 100 MG capsule Take 100 mg by mouth at bedtime.    Historical Provider, MD  HYDROcodone-acetaminophen (NORCO/VICODIN) 5-325 MG per tablet Take 1-2 tablets by mouth every 4 (four) hours as needed for moderate pain. 06/11/14   Dawood Elgergawy, MD  insulin aspart (NOVOLOG) 100 UNIT/ML injection Inject 0-20 Units into the skin 3 (three) times daily with meals. 06/11/14   Dawood Elgergawy, MD  insulin glargine (LANTUS) 100 UNIT/ML injection  Inject 0.18 mLs (18 Units total) into the skin daily. 06/11/14   Dawood Elgergawy, MD  ipratropium-albuterol (DUONEB) 0.5-2.5 (3) MG/3ML SOLN Take 3 mLs by nebulization every 6 (six) hours as needed. 06/11/14   Phillips Climes, MD  isosorbide mononitrate (IMDUR) 30 MG 24 hr tablet Take 0.5 tablets (15 mg total) by mouth daily. 06/11/14   Phillips Climes, MD  Multiple Vitamin (MULTIVITAMIN) tablet Take 1 tablet by mouth daily.    Historical Provider, MD  predniSONE (DELTASONE) 10 MG tablet Take 3 tablets (30 mg total) by mouth daily with breakfast. 06/13/14 06/13/14  Phillips Climes, MD  predniSONE (DELTASONE) 10 MG tablet Take 1 tablet (10 mg total) by mouth daily with breakfast. 06/15/14 06/15/14  Phillips Climes, MD  predniSONE (DELTASONE) 20 MG tablet Take 2 tablets (40 mg total) by mouth daily with breakfast. 06/12/14 06/12/14  Phillips Climes, MD  predniSONE (DELTASONE) 20 MG tablet Take 1 tablet (20 mg total) by mouth daily with breakfast. 06/14/14 06/14/14  Phillips Climes, MD  pregabalin (LYRICA) 75 MG capsule Take 75 mg by mouth 2 (two) times daily.    Historical Provider, MD  Psyllium (METAMUCIL PO) Take by mouth at bedtime.    Historical Provider, MD  sertraline (ZOLOFT) 50 MG tablet Take 50 mg by mouth at bedtime. 03/16/13   Historical Provider, MD    Allergies  Allergen Reactions  . Cortisone Anaphylaxis and Other (See Comments)    Cold, elevated BP, shaking all over and fever  Wife says tolerates hydrocortisone cream at home  . Ambien [Zolpidem Tartrate]     Did not tolerate well or remembered anything from previous night  . Rocephin [Ceftriaxone] Hives  . Levaquin [Levofloxacin In D5w] Rash    Physical Exam  Vitals  Blood pressure 170/69, pulse 61, temperature 98 F (36.7 C), temperature source Oral, resp. rate 17, height 5' 8"  (1.727 m), weight 225 lb 12 oz (102.4 kg), SpO2 100 %.   General:  In mild distress as evidenced by complaints of lower stream and he discomfort and overall  deconditioning, appears chronically ill and somewhat anxious  Psych:  Normal affect with anxiety, Denies Suicidal or Homicidal ideations, appears depressed overall, Awake, Oriented X 3. Speech and thought patterns are clear and appropriate, no apparent short term memory deficits  Neuro:   No focal neurological deficits, CN II through XII intact, Strength 3/5 in right lower extremity with absent patellar reflex, strength 4/5 in left lower extremity with barely perceptible patellar reflex, drink in upper extremities normal, Sensation intact all 4 extremities. No appreciable tremulous activity visualized to reproducible  and right lower extremity  ENT:  Ears and Eyes appear Normal, Conjunctivae clear, PERL. Moist oral mucosa without erythema or exudates.  Neck:  Supple, No lymphadenopathy appreciated  Respiratory:  Symmetrical chest wall movement, Good air movement bilaterally, CTAB. Room Air  Cardiac:  RRR, No Murmurs, trace chronic appearing bilateral LE edema noted, no JVD, No carotid bruits, peripheral pulses palpable at 2+  Abdomen:  Positive bowel sounds, Soft, Non tender, Non distended,  No masses appreciated, no obvious hepatosplenomegaly  Skin:  No Cyanosis, Normal Skin Turgor, No Skin Rash or Bruise.  Extremities: Symmetrical without obvious trauma or injury,  no effusions. Both right toes with redness and tenderness  Data Review  CBC  Recent Labs Lab 06/06/14 0230 06/07/14 0653 06/08/14 0205 06/09/14 0309 06/11/14 0400  WBC 6.7 5.9 4.4 7.6 9.1  HGB 10.1* 10.9* 9.6* 10.8* 11.8*  HCT 29.3* 31.1* 27.7* 30.7* 34.0*  PLT 239 PLATELET CLUMPS NOTED ON SMEAR, COUNT APPEARS ADEQUATE 259 313 330  MCV 84.2 83.8 82.4 83.4 84.2  MCH 29.0 29.4 28.6 29.3 29.2  MCHC 34.5 35.0 34.7 35.2 34.7  RDW 12.7 12.7 12.4 12.5 12.6  LYMPHSABS 0.4* 0.3*  --   --   --   MONOABS 0.4 0.6  --   --   --   EOSABS 0.0 0.0  --   --   --   BASOSABS 0.0 0.0  --   --   --     Chemistries   Recent  Labs Lab 06/06/14 0230 06/07/14 0653 06/07/14 1820 06/08/14 0205 06/09/14 0309 06/10/14 0444 06/11/14 0400 06/11/14 2004  NA 130* 127* 126* 128* 132* 134* 134*  --   K 5.0 5.5* 5.6* 5.1 4.6 4.0 4.7  --   CL 94* 92* 93* 91* 96 97 101  --   CO2 19 23 21 22 21 25 25   --   GLUCOSE 204* 191* 336* 345* 212* 161* 145*  --   BUN 104* 126* 133* 140* 148* 145* 138*  --   CREATININE 5.71* 5.59* 5.37* 5.27* 4.36* 4.02* 3.36* 2.98*  CALCIUM 7.3* 7.0* 6.8* 6.9* 7.1* 7.4* 7.5*  --   MG 2.4 2.7*  --   --   --   --   --   --   AST 36 33  --   --   --  21  --   --   ALT 37 36  --   --   --  32  --   --   ALKPHOS 83 79  --   --   --  72  --   --   BILITOT 0.4 0.7  --   --   --  0.6  --   --     estimated creatinine clearance is 25.6 mL/min (by C-G formula based on Cr of 2.98).  No results for input(s): TSH, T4TOTAL, T3FREE, THYROIDAB in the last 72 hours.  Invalid input(s): FREET3  Coagulation profile No results for input(s): INR, PROTIME in the last 168 hours.  No results for input(s): DDIMER in the last 72 hours.  Cardiac Enzymes No results for input(s): CKMB, TROPONINI, MYOGLOBIN in the last 168 hours.  Invalid input(s): CK  Invalid input(s): POCBNP  Urinalysis    Component Value Date/Time   COLORURINE AMBER* 06/03/2014 2000   APPEARANCEUR TURBID* 06/03/2014 2000   LABSPEC 1.021 06/03/2014 2000   PHURINE 5.0 06/03/2014 2000   GLUCOSEU 250* 06/03/2014 2000   HGBUR LARGE* 06/03/2014 2000  BILIRUBINUR SMALL* 06/03/2014 2000   KETONESUR NEGATIVE 06/03/2014 2000   PROTEINUR >300* 06/03/2014 2000   UROBILINOGEN 0.2 06/03/2014 2000   NITRITE NEGATIVE 06/03/2014 2000   LEUKOCYTESUR TRACE* 06/03/2014 2000    Imaging results:   Ct Abdomen Pelvis Wo Contrast  06/11/2014   CLINICAL DATA:  Initial evaluation for a generalized pain, weakness, weight loss of 15 lb, past medical history of diabetes and hypertension as well as gout and heart failure  EXAM: CT CHEST, ABDOMEN AND PELVIS  WITHOUT CONTRAST  TECHNIQUE: Multidetector CT imaging of the chest, abdomen and pelvis was performed following the standard protocol without IV contrast.  COMPARISON:  08/13/2013  FINDINGS: CT CHEST FINDINGS  Mild bilateral atelectasis. No significant parenchymal opacities otherwise.  Thyroid normal. Thoracic inlet normal. Calcification of the thoracic aorta. Moderate to severe coronary artery calcification. No significant pleural or pericardial effusion. No significant hilar or mediastinal adenopathy. 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  Stable latissimus dorsi 5 cm lipoma. No acute thoracic musculoskeletal findings.  CT ABDOMEN AND PELVIS FINDINGS  Liver is normal. Gallbladder is surgically absent. Spleen is normal. There is a stable 2 cm splenule in the hilum. Pancreas is normal.  Adrenal glands are normal. Significant perinephric inflammatory change bilaterally similar to prior study likely chronic. Bilateral renal cortical atrophy stable. 1 mm stone lower pole left kidney. Low attenuation 1.5 lesion lower pole left kidney not characterized without contrast but stable. 15 mm upper pole low-attenuation right renal lesions stable. 1 mm stone lower pole right kidney.  Increased attenuation in the bilateral flank subcutaneous soft tissues extending inferiorly into the pelvis. Foley catheter decompresses the bladder. Nonobstructive bowel gas pattern. Significant diverticulosis involving the descending and sigmoid colon without evidence of diverticulitis. Fecal retention throughout the colon noted. Appendix is normal. Small bowel is normal. Stomach is normal.  No acute musculoskeletal findings in the abdomen or pelvis. Extensive calcification of the abdominal aorta without dilatation. Bilateral fat containing inguinal hernias. Trace free fluid layering dependently in the pelvis.  IMPRESSION: 1. No acute findings in the thorax 2.  Bilateral subcutaneous flank edema possibly related to anasarca. Trace free fluid layering dependently in the pelvis appear 3. Stable indeterminate renal lesions, likely cysts although not characterized without contrast fully. 4. Diverticulosis.  Fecal retention.   Electronically Signed   By: Skipper Cliche M.D.   On: 06/11/2014 15:54   Dg Knee 1-2 Views Left  06/10/2014   CLINICAL DATA:  Left medial knee pain.  Trouble walking.  EXAM: LEFT KNEE - 1-2 VIEW  COMPARISON:  None.  FINDINGS: There is no evidence of fracture, dislocation, or joint effusion. There is no lytic or sclerotic osseous lesion. 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 There is mild patellofemoral osteoarthritis. There is peripheral vascular atherosclerotic disease. Soft tissues are unremarkable.  IMPRESSION: No acute osseous injury of the left knee.   Electronically Signed   By: Kathreen Devoid   On: 06/10/2014 18:37   Dg Knee 1-2 Views Right  06/10/2014   CLINICAL DATA:  Severe lower body pain. No known injury. Bilateral hip pain. Trouble walking. Right lateral knee pain. Left medial knee pain.  EXAM: RIGHT KNEE - 1-2 VIEW  COMPARISON:  None.  FINDINGS: No acute fracture or dislocation. Moderate joint effusion. Tricompartmental osteoarthritis of the right knee most severe in the medial femorotibial compartment. Chondrocalcinosis of the lateral femorotibial compartment as can be seen  with CPPD. There is peripheral vascular atherosclerotic disease.  IMPRESSION: No acute osseous injury of the right knee.  Tricompartmental osteoarthritis of the right knee.   Electronically Signed   By: Kathreen Devoid   On: 06/10/2014 18:27   Dg Ankle Complete Left  06/10/2014   CLINICAL DATA:  Lower body pain.  Trouble walking.  EXAM: LEFT ANKLE COMPLETE - 3+ VIEW  COMPARISON:  None.  FINDINGS: There is no evidence of fracture, dislocation, or joint effusion. The ankle mortise is intact. There is a plantar  calcaneal spur. There are enthesopathic changes at the Achilles tendon insertion. There are degenerative changes of the talonavicular joint. There is mild osteoarthritis of the tibiotalar joint. There is mild osteoarthritis of the first TMT joint. There is peripheral vascular atherosclerotic disease. Soft tissues are unremarkable.  IMPRESSION: 1. No acute osseous injury of the left ankle.   Electronically Signed   By: Kathreen Devoid   On: 06/10/2014 18:35   Dg Ankle Complete Right  06/10/2014   CLINICAL DATA:  Lower body pain.  Trouble walking.  EXAM: RIGHT ANKLE - COMPLETE 3+ VIEW  COMPARISON:  None.  FINDINGS: There is no evidence of fracture, dislocation, or joint effusion. There is a distal fibular side plate with multiple interlocking screws without failure or complication. There is a medial malleolar screw and a medial malleolar K-wire without failure or complication. The ankle mortise is intact. There is a plantar calcaneal spur. There are enthesopathic changes at the Achilles tendon insertion. There are degenerative changes of the talonavicular joint. There is osteoarthritis of the tibiotalar joint. Soft tissues are unremarkable.  IMPRESSION: 1. No acute osseous injury of the right ankle. 2. 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   Electronically Signed   By: Kathreen Devoid   On: 06/10/2014 18:34   Ct Chest Wo Contrast  06/11/2014   CLINICAL DATA:  Initial evaluation for a generalized pain, weakness, weight loss of 15 lb, past medical history of diabetes and hypertension as well as gout and heart failure  EXAM: CT CHEST, ABDOMEN AND PELVIS WITHOUT CONTRAST  TECHNIQUE: Multidetector CT imaging of the chest, abdomen and pelvis was performed following the standard protocol without IV contrast.  COMPARISON:  08/13/2013  FINDINGS: CT CHEST FINDINGS  Mild bilateral atelectasis. No significant parenchymal opacities otherwise.  Thyroid normal. Thoracic inlet normal. Calcification of the  thoracic aorta. Moderate to severe coronary artery calcification. No significant pleural or pericardial effusion. No significant hilar or mediastinal adenopathy. 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  Stable latissimus dorsi 5 cm lipoma. No acute thoracic musculoskeletal findings.  CT ABDOMEN AND PELVIS FINDINGS  Liver is normal. Gallbladder is surgically absent. Spleen is normal. There is a stable 2 cm splenule in the hilum. Pancreas is normal.  Adrenal glands are normal. Significant perinephric inflammatory change bilaterally similar to prior study likely chronic. Bilateral renal cortical atrophy stable. 1 mm stone lower pole left kidney. Low attenuation 1.5 lesion lower pole left kidney not characterized without contrast but stable. 15 mm upper pole low-attenuation right renal lesions stable. 1 mm stone lower pole right kidney.  Increased attenuation in the bilateral flank subcutaneous soft tissues extending inferiorly into the pelvis. Foley catheter decompresses the bladder. Nonobstructive bowel gas pattern. Significant diverticulosis involving the descending and sigmoid colon without evidence of diverticulitis. Fecal retention throughout the colon noted. Appendix is normal. Small bowel is normal. Stomach is normal.  No acute musculoskeletal findings  in the abdomen or pelvis. Extensive calcification of the abdominal aorta without dilatation. Bilateral fat containing inguinal hernias. Trace free fluid layering dependently in the pelvis.  IMPRESSION: 1. No acute findings in the thorax 2. Bilateral subcutaneous flank edema possibly related to anasarca. Trace free fluid layering dependently in the pelvis appear 3. Stable indeterminate renal lesions, likely cysts although not characterized without contrast fully. 4. Diverticulosis.  Fecal retention.   Electronically Signed   By: Skipper Cliche M.D.   On: 06/11/2014 15:54   Mr Lumbar  Spine Wo Contrast  06/09/2014   CLINICAL DATA:  Focal lower extremity weakness. History of generalized weakness. No acute back pain, bowel or bladder symptoms. Initial encounter.  EXAM: MRI LUMBAR SPINE WITHOUT CONTRAST  TECHNIQUE: Multiplanar, multisequence MR imaging of the lumbar spine was performed. No intravenous contrast was administered.  COMPARISON:  Abdominal pelvic CT 08/13/2013.  FINDINGS: The lumbar spine examination is mildly motion degraded, especially on the axial images. CT demonstrates 5 lumbar type vertebral bodies. The alignment is stable and near anatomic. The marrow signal within the spine is mildly heterogeneous without focally suspicious lesion. There are foci of decreased T1 and T2 signal in the left iliac bone without clear corresponding finding on prior CT.  The conus medullaris extends to the T12-L1 level and appears normal. No focal paraspinal abnormalities are identified. There is subcutaneous edema in the back.  No significant disc space findings are seen from T10-11 through L2-3. There are paraspinal osteophytes anteriorly in the lower thoracic spine.  L3-4: Mild annular disc bulging. There is facet and ligamentous hypertrophy with bilateral facet joint effusions. These factors contribute to mild spinal stenosis with mild narrowing of both lateral recesses.  L4-5: Mild annular disc bulging. There is moderate facet and ligamentous hypertrophy with bilateral facet joint effusions. These factors contribute to moderate spinal stenosis with asymmetric narrowing of the right lateral recess and right foramen. There is mild subchondral edema within the facet joints without bone destruction or periarticular fluid collection.  L5-S1: Disc height and hydration are maintained. Mild bilateral facet hypertrophy. No spinal stenosis or nerve root encroachment.  IMPRESSION: 1. Moderate facet and ligamentous hypertrophy with facet joint effusions and subchondral edema bilaterally at L3-4 and L4-5. 2.  Associated annular disc bulging at both levels, contributing to mild spinal stenosis at L3-4 and moderate spinal stenosis at L4-5. 3. Nonspecific marrow lesions within the left iliac bone. Early metastatic disease cannot be completely excluded. No suspicious lesions demonstrated within the lumbar spine.   Electronically Signed   By: Richardean Sale M.D.   On: 06/09/2014 17:06   Nm Bone Scan Whole Body  06/10/2014   CLINICAL DATA:  Lower extremity weakness and pain. History of diabetes, hypertension and stroke. Indeterminate lesions in left iliac bone on MRI. No given history of malignancy.  EXAM: NUCLEAR MEDICINE WHOLE BODY BONE SCAN  TECHNIQUE: Whole body anterior and posterior images were obtained approximately 3 hours after intravenous injection of radiopharmaceutical.  RADIOPHARMACEUTICALS:  25.0 mCi Technetium-99 MDP  COMPARISON:  Lumbar spine and pelvic MRI 06/09/2014.  FINDINGS: There is no osseous activity suspicious for metastatic disease. Specifically, no abnormal activity is seen within the left iliac bone or remainder of the pelvis. There is scattered arthropathic activity within the large joints, primarily the shoulders, elbows and knees. There is also some arthropathic activity throughout the hands and feet bilaterally. Activity is present within the Foley catheter bag between lower legs.  IMPRESSION: No bone scan evidence of osseous metastatic disease.  Scattered arthropathic activity.   Electronically Signed   By: Richardean Sale M.D.   On: 06/10/2014 15:42   US Renal  05/31/2014   CLINICAL DATA:  Hypertension.  History of diabetes.  EXAM: RENAL/URINARY TRACT ULTRASOUND COMPLETE  COMPARISON:  None.  FINDINGS: Right Kidney:  Length: 12.4 cm. Borderline increased parenchymal echogenicity and mild diffuse cortical thinning. Normal overall renal parenchymal echogenicity. Several small cysts, largest arising from the cortex of the upper pole measuring 2 cm. No solid renal masses. No hydronephrosis.   Left Kidney:  Length: 12.7 cm. Lobulated contour with mild diffuse cortical thinning. Normal parenchymal echogenicity. 2 cm lower pole cortical cyst. Smaller cysts lies adjacent to this measuring 13 mm. No solid renal masses. No hydronephrosis.  Bladder:  Appears normal for degree of bladder distention.  IMPRESSION: 1. No hydronephrosis. 2. Small bilateral renal cysts. Borderline increased parenchymal echogenicity from the right kidney. Bilateral, left greater than right, renal cortical thinning.   Electronically Signed   By: Lajean Manes M.D.   On: 05/31/2014 17:32   Mr Sacrum/si Joints Wo Contrast  06/09/2014   CLINICAL DATA:  Focal lower extremity weakness. History of generalized weakness. No acute back pain, bowel or bladder symptoms. Initial encounter.  EXAM: MR SACRUM WITHOUT CONTRAST  TECHNIQUE: Multiplanar, multisequence MR imaging was performed. No intravenous contrast was administered.  COMPARISON:  Pelvic CT 08/13/2013.  FINDINGS: Lumbar spine findings dictated separately.  No evidence of sacral fracture or destruction. The sacroiliac joints are intact without erosive change. There is no presacral fluid collection. There is no sacral nerve foraminal compromise or nerve root encroachment.  There is mildly heterogeneous marrow signal within the sacrum and iliac bones on the T1 weighted images. No suspicious lesions demonstrated on T2 or inversion recovery imaging.  Foley catheter is in place. The bladder is decompressed and demonstrates mild wall thickening. There is nodularity throughout the peripheral and central portions of the prostate gland. Asymmetric fat in the right inguinal canal is incompletely visualized, although grossly stable. There is nonspecific subcutaneous edema dependently in the buttocks as well as mild perirectal edema.  IMPRESSION: 1. Negative MRI of the sacrum and sacroiliac joints. 2. No evidence of sacral nerve root encroachment. 3. Lumbar spine findings dictated separately.    Electronically Signed   By: Richardean Sale M.D.   On: 06/09/2014 16:54   Nm Myocar Multi W/spect W/wall Motion / Ef  06/02/2014   CLINICAL DATA:  Mr Meras is a 74 year old with a history of diabetes, CHF (EF unknown), CKD creatnine baseline 2.5, and CVA 2009-2010.  EXAM: MYOCARDIAL IMAGING WITH SPECT (PHARMACOLOGIC-STRESS)  GATED LEFT VENTRICULAR WALL MOTION STUDY  LEFT VENTRICULAR EJECTION FRACTION  TECHNIQUE: Intravenous infusion of Lexiscan was performed under the supervision of the Cardiology staff. At peak effect of the drug, 10 mCi Tc-42msestamibi was injected intravenously and standard myocardial SPECT imaging was performed. Quantitative gated imaging was also performed to evaluate left ventricular wall motion, and estimate left ventricular ejection fraction.  COMPARISON:  None.  FINDINGS: Perfusion: There is a large and severe inferior and inferoseptal perfusion defect with minimal reversibility around the defect edges. This is consistent with a large infarction in the right coronary artery distribution with mild peri-infarct ischemia.  Wall Motion: Mild global hypokinesis with inferior wall akinesis. Moderate left ventricular dilation.  Left Ventricular Ejection Fraction: 34 %  End diastolic volume 2683ml  End systolic volume 1729ml  IMPRESSION: 1. Scar with mild peri-infarct ischemia consistent with infarction in  the entire RCA distribution  2.  Inferior wall akinesis.  3. Left ventricular ejection fraction 34%  4. High-risk stress test findings*. Minimal reversible ischemia. Moderately severe ischemic cardiomyopathy  *2012 Appropriate Use Criteria for Coronary Revascularization Focused Update: J Am Coll Cardiol. 0258;52(7):782-423. http://content.airportbarriers.com.aspx?articleid=[email or web address]   Electronically Signed   By: Sanda Klein   On: 06/02/2014 13:42   Nm Pulmonary Perf And Vent  06/05/2014   CLINICAL DATA:  Dyspnea. History of gout, diabetes, hypertension, stroke, heart failure.  EXAM:  NUCLEAR MEDICINE VENTILATION - PERFUSION LUNG SCAN  TECHNIQUE: Ventilation images were obtained in multiple projections using inhaled aerosol technetium 99 M DTPA. Perfusion images were obtained in multiple projections after intravenous injection of Tc-44mMAA.  RADIOPHARMACEUTICALS:  40.0 mCi Tc-923mTPA aerosol and 6.0 mCi Tc-9965mA  COMPARISON:  Chest x-ray 06/03/2014  FINDINGS: Ventilation: No focal ventilation defect.  Perfusion: No wedge shaped peripheral perfusion defects to suggest acute pulmonary embolism.  IMPRESSION: Normal ventilation perfusion scan.   Electronically Signed   By: EliNolon NationsD.   On: 06/05/2014 14:52   Dg Chest Port 1 View  06/06/2014   CLINICAL DATA:  [age]ar old male with congestive heart failure  EXAM: PORTABLE CHEST - 1 VIEW  COMPARISON:  Prior chest x-ray 06/03/2014  FINDINGS: Stable cardiomegaly with left heart prominence. Decreased interstitial pulmonary edema but persistent pulmonary vascular congestion. Left retrocardiac opacity remains unchanged. No large pleural effusion and no evidence of pneumothorax. No acute osseous abnormality.  IMPRESSION: 1. Resolving pulmonary interstitial edema with persistent vascular congestion. 2. Unchanged left retrocardiac opacity favored to reflect a combination of atelectasis and perhaps pleural effusion.   Electronically Signed   By: HeaJacqulynn CadetD.   On: 06/06/2014 12:06   Dg Chest Port 1 View  06/03/2014   CLINICAL DATA:  Shortness of breath, flu symptoms  EXAM: PORTABLE CHEST - 1 VIEW  COMPARISON:  Portable chest x-ray of May 31, 2014  FINDINGS: The lungs are adequately inflated. The interstitial markings are less congested today. The cardiac silhouette remains enlarged. The pulmonary vascularity remains engorged but there is less cephalization of the vascular pattern. The retrocardiac region on the left is less dense.  IMPRESSION: Findings consistent with mild improvement in pulmonary interstitial edema secondary  to CHF. When the patient can tolerate the procedure, a PA and lateral chest x-ray would be useful.   Electronically Signed   By: David  JorMartiniqueOn: 06/03/2014 07:30   Dg Chest Port 1 View  05/31/2014   CLINICAL DATA:  Shortness of breath and congestion.  History of CHF.  EXAM: PORTABLE CHEST - 1 VIEW  COMPARISON:  None.  FINDINGS: Trachea is midline. Heart is enlarged. There is perihilar and basilar predominant mixed interstitial and airspace opacification. Probable bilateral effusions.  Degenerative changes are seen in the shoulders.  IMPRESSION: Congestive heart failure.   Electronically Signed   By: MelLorin PicketD.   On: 05/31/2014 18:29   Dg Hips Bilat With Pelvis 3-4 Views  06/10/2014   CLINICAL DATA:  Lower body pain. Trouble walking. Bilateral hip pain.  EXAM: BILATERAL HIP (WITH PELVIS) 3-4 VIEWS  COMPARISON:  None.  FINDINGS: No fracture or dislocation. Joint spaces are maintained. No lytic or sclerotic osseous lesion. Peripheral vascular atherosclerotic disease.  IMPRESSION: No acute osseous injury of bilateral hips.   Electronically Signed   By: HetKathreen DevoidOn: 06/10/2014 18:38    Assessment & Plan  Active Problems:   Myalgia/Polyarthralgia/elevated ESR -Etiology has  not yet been determined -ESR has markedly decreased without any significant improvement and patient's complaints -Continue IV Solu-Medrol -Patient complaining of spasms in legs so add Robaxin -Continue very low-dose Lyrica-given chronic kidney disease would not increase this medication further -Follow-up on previously ordered serologies; serological evaluation negative thus far -Recommend follow-up with rheumatological services at [facility] or [facility]; patient will need to establish and will likely need assistance with arranging outpatient appointment prior to discharge      Elevated PSA -9.40 on 3/4 -Unfortunately has no useful clinical significance in acute setting especially with recent abnormal urinalysis and  current indwelling Foley catheter which can skew results -Recommend patient follow-up as an outpatient with primary care physician for additional workup including repeat PSA -Patient complaining of discomfort from Foley so we'll discontinue and allow for in and out catheterization and monitor for urinary retention -Given patient's recent neurological complaints I did question him regarding bowel incontinence which she denies so doubt he should have true neurogenic bladder symptoms that would lead to urinary retention    Hypertension -Current blood pressure moderately controlled but also being influenced by patient's underlying pain complaints -Per cardiology okay to resume low-dose beta blocker and increase hydralazine as needed; really not on any hypertensive medications so planned to resume beta blocker and hydralazine on 3/5 but pt had either an orthostatic or vagal episode with SBP down to 115 and was symptomatic so did not start; asked RN to ck OVS in event he needs fluids -No ACE/ARB due to renal failure -Patient was on Imdur prior to admission so certainly could substitute this for hydralazine if desired    CKD (chronic kidney disease), stage IV -Creatinine today 2.98 and decreased from 3.36 on 3/4 -Making urine senility indication to pursue hemodialysis at this juncture -Repeat full electrolyte panel on Monday since we are initiating very low-dose colchicine for gout recommendation of orthopedic physician    Primary gout -We'll begin colchicine 0.3 mg daily as recommended by orthopedic MD -Resume allopurinol -Continue steroids as above    Diabetes type 2, uncontrolled -Current CBGs improved but still some readings greater than 200 in the past 24 hours -Continue sliding scale insulin -Continue Lantus but increase dose from 18-22 units since on steroids    Debilitated -Treatment per CIR Team    Chronic combined systolic and diastolic heart failure -Currently compensated     Situational depression -Continue Zoloft   History of CVA -Continue Plavix    DVT Prophylaxis: subcutaneous heparin  Family Communication:  No family at bedside   Code Status:  Limited code: No intubation only  Condition:  Stable  Time spent in minutes : 60   ELLIS,ALLISON L. ANP on 06/12/2014 at 11:10 AM  Between 7am to 7pm - Pager - [phone]  After 7pm go to [email or web address] - password [email or web address]  And look for the night coverage person covering me after hours  [facility]

## 2014-06-12 NOTE — Evaluation (Signed)
Physical Therapy Assessment and Plan  Patient Details  Name: Darrell Richardson MRN: [id number] Date of Birth: 18-Sep-1940  PT Diagnosis: Abnormal posture, Cognitive deficits, Difficulty walking, Dizziness and giddiness, Edema, Impaired cognition, Impaired sensation, Muscle weakness and Pain in feet and R lateral thigh Rehab Potential: Fair ELOS: 3-4 weeks   Today's Date: 06/12/2014 PT Individual Time: 1000-1130 Treatment Time: 90 min    Problem List:  Patient Active Problem List   Diagnosis Date Noted  . Chronic combined systolic and diastolic heart failure 38/46/6599  . ESR raised 06/12/2014  . Elevated PSA 06/12/2014  . Debilitated 06/11/2014  . Palliative care encounter 06/09/2014  . Myalgia   . Polyarthralgia   . Primary gout   . Secondary cardiomyopathy   . Diabetes type 2, uncontrolled   . History of CVA (cerebrovascular accident)   . CKD (chronic kidney disease), stage IV 05/31/2014  . Hypertension     Past Medical History:  Past Medical History  Diagnosis Date  . Hearing loss   . Gout   . Diabetes   . Fatigue   . Hypertension   . Stroke     X 2  . Heart failure   . Muscle pain   . Hammertoe    Past Surgical History:  Past Surgical History  Procedure Laterality Date  . Stomach surgery    . Foot surgery Right   . Tee without cardioversion N/A 06/07/2014    Procedure: TRANSESOPHAGEAL ECHOCARDIOGRAM (TEE);  Surgeon: Jolaine Artist, MD;  Location: [facility] ENDOSCOPY;  Service: Cardiovascular;  Laterality: N/A;    Assessment & Plan Clinical Impression: Darrell Richardson is a 74 y.o. right handed male with history of CVA with residual left hemiparesis, diabetes mellitus peripheral neuropathy, gout, diastolic congestive heart failure, chronic renal insufficiency 2.44. Lives with his wife and used a cane prior to admission. Presented to [facility] 05/31/2014 with increasing nonspecific chest pain and shortness of breath and transferred to [facility]  for further medical evaluation. Complaints of increased shortness of breath and lower extremity edema. He was found to have acute respiratory failure secondary to acute CHF exacerbation placed on BiPAP. Troponin was negative. BNP 7363 Patient placed on Lasix. Echocardiogram with ejection fraction of 45% with akinesis of the inferior lateral and inferior myocardium. Progressive increase in creatinine function from baseline 2.4-5.37 Renal service is consulted. Renal ultrasound showed no hydronephrosis and latest creatinine 4.02. Complaints of joint pain low-grade fever with ESR 135 as well as uric acid level 8.3. Patient had been placed on Solu-Medrol with workup concerning for PMR versus RA. It was felt prednisone was inducing increased renal function. TEE completed 06/07/2014 showing no vegetation moderate sized PFO with left-to-right shunting at rest. Full workups remain underway with ANCA studies normal.. Palliative care has been consult to establish goals of care. MRI lumbar spine revealed some bulging disc and some spinal stenosis. Also concern over bone marrow abnormalities with neurology services to follow-up with bone scan completed for clarity that showed no evidence of osseous metastatic disease 06/10/2014. Subcutaneous heparin for DVT prophylaxis. Physical therapy evaluation completed 06/05/2014 with recommendations of physical medicine rehabilitation consult.  Patient transferred to [facility] on 06/11/2014 .   Patient currently requires total with mobility secondary to muscle weakness, decreased cardiorespiratoy endurance, decreased coordination and decreased motor planning, decreased attention, decreased awareness, decreased problem solving, decreased safety awareness, decreased memory and delayed processing and decreased sitting balance, decreased standing balance, decreased postural control and decreased balance strategies.  Prior to  hospitalization, patient was modified independent  with mobility and lived  with Spouse in a House home.  Home access is  Ramped entrance.  Patient will benefit from skilled PT intervention to maximize safe functional mobility, minimize fall risk and decrease caregiver burden for planned discharge home with 24 hour assist.  Anticipate patient will benefit from follow up [facility] at discharge.     Skilled Therapeutic Intervention PT Evaluation: Pt presents very deconditioned with multiple chronic health conditions being managed. Pt was admitted to the hospital due to a CHF exacerbation, but is receiving a new diagnosis of polymyalgia rheumatica, per hospitalist who visited with pt during PT session. Vitals monitored very closely during PT session and PT notes pt to be orthostatic during supine to sit transfer, but pt's BP stabilizes after sitting up for a while and adjusting. Pt talked with hospitalist while sitting up in w/c for at least 10 minutes and sat up on [facility] for 15 minutes prior to suspected vaso-vagal event noted below. RN present in room for immediate post-evaluation after near-syncopal event. Pt will benefit from IPR PT for 15 hours over 7 days due to low activity tolerance. Discharge destination TBD pending pt's functional mobility progress and burden of care. Pt presents with low activity tolerance, strong pain in legs, LE muscle weakness, impaired sitting balance, inability to stand or attempt ambulation on day of d/c, low w/c pushing endurance, and difficulty with slideboard transfers & bed mobility.   Therapeutic Activity: PT instructs pt in rolling R req mod A and rolling L req max A in flat bed without rail. PT instructs pt in L side lie to sit transfer req mod A, and CGA-min A for static sit balance edge of bed due to pt's trunk retropulsion.  PT instructs pt in multiple scoot transfers with slideboard req +2 assist: bed to w/c, w/c to bed, bed to [facility] - PT instructs pt in head-hips relationship, but pt req tactile cues for forward & direction of trunk lean, as well  as mod A x 2 to complete the transfer.  On BSC, pt has a suspected vaso-vagal event after viewing his urinary catheter being removed by CNA and after having a BM- pt reports dizziness and gets a glazed over look on his face - PT shouts for help and PT and CNA immediately assist pt in dependent squat-pivot transfer BSC to bed and sit to supine transfer (+3 assist for legs & trunk) in bed, which assists in raising pt's blood pressure and improving his alertness. MD present and instructs PT to have pt wear an abdominal binder and TED hose for OOB therapy in the future, but to do bedside therapy for the rest of the day, today.  Pt req assist with pants management & posterior hygiene after BM, as well as a fitted bed sheet change due to BM getting on it.   W/C Management: PT instructs pt in w/c propulsion with B UEs x 7' within room req SBA - this activity was interrupted by pt reporting he needs to have a BM. Pt req physical assist with w/c parts management.   Pt will benefit from IPR PT to maximize safety with functional mobility. Continue per PT POC.     PT Evaluation Precautions/Restrictions Precautions Precautions: Fall Restrictions Weight Bearing Restrictions: No General Chart Reviewed: Yes Family/Caregiver Present: No  Vital SignsTherapy Vitals Pulse Rate: 61 Patient Position (if appropriate): Orthostatic Vitals Oxygen Therapy SpO2: 100 % O2 Device: Not Delivered Pain Pain Assessment Pain Assessment: 0-10  Pain Score: 7  Pain Type: Chronic pain Pain Location: Foot Pain Orientation: Right;Left Pain Descriptors / Indicators: Sharp Pain Onset: On-going Pain Intervention(s): Medication (See eMAR) Multiple Pain Sites: Yes 2nd Pain Site Pain Score: 5 Pain Type: Acute pain (2 weeks ago it began) Pain Location: Leg Pain Orientation: Right;Proximal;Lateral Pain Descriptors / Indicators: Other (Comment) (pulling) Pain Onset: With Activity Pain Intervention(s): Rest Home  Living/Prior Functioning Home Living Available Help at Discharge: Family Type of Home: House Home Access: Ramped entrance Home Layout: One level  Lives With: Spouse Prior Function Level of Independence: Requires assistive device for independence  Able to Take Stairs?: Yes Driving: Yes Vocation: Retired Radiographer, therapeutic - Assessment Additional Comments: pt reports floaters x 6 months  Cognition Overall Cognitive Status: Within Functional Limits for tasks assessed Arousal/Alertness: Lethargic Orientation Level: Oriented X4 Attention: Focused;Sustained Focused Attention: Appears intact Sustained Attention: Impaired Sustained Attention Impairment: Verbal complex (due to fatigue) Awareness: Appears intact Problem Solving: Impaired Problem Solving Impairment: Functional basic Safety/Judgment: Impaired Sensation Sensation Light Touch: Impaired Detail Light Touch Impaired Details: Impaired RLE Stereognosis: Not tested Hot/Cold: Not tested Proprioception: Not tested Additional Comments: pt reports intermittent R foot numbness - chart reports peripheral neuropathy from DM Coordination Gross Motor Movements are Fluid and Coordinated: Yes Fine Motor Movements are Fluid and Coordinated: Not tested Motor  Motor Motor: Abnormal postural alignment and control Motor - Skilled Clinical Observations: retropulsive in sitting despite multiple cues to lean forward from PT  Mobility Bed Mobility Bed Mobility: Rolling Right;Rolling Left;Left Sidelying to Sit Rolling Right: 3: Mod assist Rolling Right Details: Manual facilitation for placement;Verbal cues for technique Rolling Left: 2: Max assist Rolling Left Details: Manual facilitation for placement;Verbal cues for technique Left Sidelying to Sit: 3: Mod assist Left Sidelying to Sit Details: Manual facilitation for placement;Verbal cues for technique Sit to Supine: 1: +2 Total assist Sit to Supine - Details: Manual facilitation  for placement Sit to Supine - Details (indicate cue type and reason): dependent transfer Transfers Transfers: Yes Lateral/Scoot Transfers: With slide board;With armrests removed;1: +2 Total assist Lateral/Scoot Transfer Details: Manual facilitation for placement;Manual facilitation for weight shifting;Verbal cues for technique Lateral/Scoot Transfer Details (indicate cue type and reason): pt demonstrates poor forward trunk lean and poor direction lean with head-hips relationship cues Locomotion  Ambulation Ambulation: No Gait Gait: No Stairs / Additional Locomotion Stairs: No Wheelchair Mobility Wheelchair Mobility: Yes Wheelchair Assistance: 5: Investment banker, operational Details: Verbal cues for Marketing executive: Both upper extremities Wheelchair Parts Management: Needs assistance Distance: 7  Trunk/Postural Assessment  Cervical Assessment Cervical Assessment: Within Functional Limits Thoracic Assessment Thoracic Assessment: Exceptions to [facility] Thoracic AROM Overall Thoracic AROM: Deficits;Due to premorid status Overall Thoracic AROM Comments: limited thoracic extension - slouched positioning chronic in nature Lumbar Assessment Lumbar Assessment: Exceptions to [facility] Lumbar AROM Overall Lumbar AROM: Deficits;Due to premorid status Overall Lumbar AROM Comments: limited into all planes of motion: flexion, rotation, extension Postural Control Postural Control: Deficits on evaluation Head Control: head postured in down and forward position - able to lift it up, but poor muscular endurance Trunk Control: retropulsive trunk in sit - unable to achieve midline trunk sitting position (A-P) Righting Reactions: delayed Postural Limitations: slouched & retropulsive posture; head forward & down  Balance Balance Balance Assessed: Yes Static Sitting Balance Static Sitting - Balance Support: Bilateral upper extremity supported;Feet supported Static Sitting - Level of  Assistance: 4: Min assist Dynamic Sitting Balance Dynamic Sitting - Balance Support: Bilateral upper extremity supported;During functional activity;Feet supported  Dynamic Sitting - Level of Assistance: 1: +2 Total assist Dynamic Sitting - Balance Activities: Forward lean/weight shifting;Lateral lean/weight shifting Extremity Assessment  RUE Assessment RUE Assessment: Exceptions to [facility] RUE AROM (degrees) Overall AROM Right Upper Extremity: Within functional limits for tasks performed RUE Strength RUE Overall Strength: Deficits RUE Overall Strength Comments: arm flexion 4/5, elbow & grip 5/5 LUE Assessment LUE Assessment: Exceptions to WFL LUE AROM (degrees) Overall AROM Left Upper Extremity: Within functional limits for tasks assessed LUE Strength LUE Overall Strength: Deficits LUE Overall Strength Comments: arm flexion 4/5, elbow and grip grossly 5/5 RLE Assessment RLE Assessment: Exceptions to [facility] RLE AROM (degrees) Overall AROM Right Lower Extremity: Deficits;Due to pain RLE Overall AROM Comments: hip flexion limited to 50 degrees, knee flexion limited to 70 degrees, ankle ROM wfl RLE Strength RLE Overall Strength: Deficits;Due to pain RLE Overall Strength Comments: hip flexion 3-/5, knee grossly 3+/5 within available ROM, ankle DF 3+/5 LLE Assessment LLE Assessment: Exceptions to WFL LLE AROM (degrees) Overall AROM Left Lower Extremity: Deficits;Due to pain LLE Overall AROM Comments: hip flexion limited to 70 degrees, knee flexion limited to 90 degrees, ankle DF wfl LLE Strength LLE Overall Strength: Deficits;Due to pain LLE Overall Strength Comments: grossly 4/5 throughout available ROM  FIM:  FIM - Locomotion: Wheelchair Distance: 7   Refer to Care Plan for Long Term Goals  Recommendations for other services: None  Discharge Criteria: Patient will be discharged from PT if patient refuses treatment 3 consecutive times without medical reason, if treatment goals not  met, if there is a change in medical status, if patient makes no progress towards goals or if patient is discharged from hospital.  The above assessment, treatment plan, treatment alternatives and goals were discussed and mutually agreed upon: by patient  [facility] 06/12/2014, 11:30 AM

## 2014-06-12 NOTE — Progress Notes (Signed)
Patient ID: Darrell Richardson, male   DOB: 1940/08/29, 74 y.o.   MRN: [id number] Reviewed PA note from Benjiman Core PA-C.   No focal area for cortisone injection. Continue therapy and rehab protocol.

## 2014-06-12 NOTE — Progress Notes (Signed)
Patient ID: Darrell Richardson, male   DOB: [birth year], 74 y.o.   MRN: [id number] S:feels terrible, in a lot of pain after working with PT O:BP 170/69 mmHg  Pulse 61  Temp(Src) 98 F (36.7 C) (Oral)  Resp 17  Ht 5\' 8"  (1.727 m)  Wt 102.4 kg (225 lb 12 oz)  BMI 34.33 kg/m2  SpO2 100%  Intake/Output Summary (Last 24 hours) at 06/12/14 1330 Last data filed at 06/12/14 1126  Gross per 24 hour  Intake     50 ml  Output   3450 ml  Net  -3400 ml   Intake/Output: I/O last 3 completed shifts: In: 28 [P.O.:50] Out: 2550 [Urine:2550]  Intake/Output this shift:  Total I/O In: -  Out: 900 [Urine:900] Weight change:  Gen:WD WN  CVS:no rub Resp:cta UTM:LYYTKP TWS:FKCLE-7+ pedal edema   Recent Labs Lab 06/06/14 0230 06/07/14 0653 06/07/14 1820 06/08/14 0205 06/09/14 0309 06/10/14 0444 06/11/14 0400 06/11/14 2004  NA 130* 127* 126* 128* 132* 134* 134*  --   K 5.0 5.5* 5.6* 5.1 4.6 4.0 4.7  --   CL 94* 92* 93* 91* 96 97 101  --   CO2 19 23 21 22 21 25 25   --   GLUCOSE 204* 191* 336* 345* 212* 161* 145*  --   BUN 104* 126* 133* 140* 148* 145* 138*  --   CREATININE 5.71* 5.59* 5.37* 5.27* 4.36* 4.02* 3.36* 2.98*  ALBUMIN 1.9* 1.9*  --  1.9*  --  2.2* 2.3*  --   CALCIUM 7.3* 7.0* 6.8* 6.9* 7.1* 7.4* 7.5*  --   PHOS  --   --   --  8.0*  --   --  6.8*  --   AST 36 33  --   --   --  21  --   --   ALT 37 36  --   --   --  32  --   --    Liver Function Tests:  Recent Labs Lab 06/06/14 0230 06/07/14 0653 06/08/14 0205 06/10/14 0444 06/11/14 0400  AST 36 33  --  21  --   ALT 37 36  --  32  --   ALKPHOS 83 79  --  72  --   BILITOT 0.4 0.7  --  0.6  --   PROT 5.7* 5.9*  --  5.5*  --   ALBUMIN 1.9* 1.9* 1.9* 2.2* 2.3*   No results for input(s): LIPASE, AMYLASE in the last 168 hours. No results for input(s): AMMONIA in the last 168 hours. CBC:  Recent Labs Lab 06/06/14 0230 06/07/14 0653 06/08/14 0205 06/09/14 0309 06/11/14 0400  WBC 6.7 5.9 4.4 7.6 9.1  NEUTROABS 5.9  5.0  --   --   --   HGB 10.1* 10.9* 9.6* 10.8* 11.8*  HCT 29.3* 31.1* 27.7* 30.7* 34.0*  MCV 84.2 83.8 82.4 83.4 84.2  PLT 239 PLATELET CLUMPS NOTED ON SMEAR, COUNT APPEARS ADEQUATE 259 313 330   Cardiac Enzymes: No results for input(s): CKTOTAL, CKMB, CKMBINDEX, TROPONINI in the last 168 hours. CBG:  Recent Labs Lab 06/11/14 1045 06/11/14 1638 06/11/14 2236 06/12/14 0804 06/12/14 1131  GLUCAP 163* 207* 242* 170* 133*    Iron Studies: No results for input(s): IRON, TIBC, TRANSFERRIN, FERRITIN in the last 72 hours. Studies/Results: Ct Abdomen Pelvis Wo Contrast  06/11/2014   CLINICAL DATA:  Initial evaluation for a generalized pain, weakness, weight loss of 15 lb, past medical history of diabetes and hypertension  as well as gout and heart failure  EXAM: CT CHEST, ABDOMEN AND PELVIS WITHOUT CONTRAST  TECHNIQUE: Multidetector CT imaging of the chest, abdomen and pelvis was performed following the standard protocol without IV contrast.  COMPARISON:  08/13/2013  FINDINGS: CT CHEST FINDINGS  Mild bilateral atelectasis. No significant parenchymal opacities otherwise.  Thyroid normal. Thoracic inlet normal. Calcification of the thoracic aorta. Moderate to severe coronary artery calcification. No significant pleural or pericardial effusion. No significant hilar or mediastinal adenopathy. 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  Stable latissimus dorsi 5 cm lipoma. No acute thoracic musculoskeletal findings.  CT ABDOMEN AND PELVIS FINDINGS  Liver is normal. Gallbladder is surgically absent. Spleen is normal. There is a stable 2 cm splenule in the hilum. Pancreas is normal.  Adrenal glands are normal. Significant perinephric inflammatory change bilaterally similar to prior study likely chronic. Bilateral renal cortical atrophy stable. 1 mm stone lower pole left kidney. Low attenuation 1.5 lesion lower pole left kidney not  characterized without contrast but stable. 15 mm upper pole low-attenuation right renal lesions stable. 1 mm stone lower pole right kidney.  Increased attenuation in the bilateral flank subcutaneous soft tissues extending inferiorly into the pelvis. Foley catheter decompresses the bladder. Nonobstructive bowel gas pattern. Significant diverticulosis involving the descending and sigmoid colon without evidence of diverticulitis. Fecal retention throughout the colon noted. Appendix is normal. Small bowel is normal. Stomach is normal.  No acute musculoskeletal findings in the abdomen or pelvis. Extensive calcification of the abdominal aorta without dilatation. Bilateral fat containing inguinal hernias. Trace free fluid layering dependently in the pelvis.  IMPRESSION: 1. No acute findings in the thorax 2. Bilateral subcutaneous flank edema possibly related to anasarca. Trace free fluid layering dependently in the pelvis appear 3. Stable indeterminate renal lesions, likely cysts although not characterized without contrast fully. 4. Diverticulosis.  Fecal retention.   Electronically Signed   By: Skipper Cliche M.D.   On: 06/11/2014 15:54   Dg Knee 1-2 Views Left  06/10/2014   CLINICAL DATA:  Left medial knee pain.  Trouble walking.  EXAM: LEFT KNEE - 1-2 VIEW  COMPARISON:  None.  FINDINGS: There is no evidence of fracture, dislocation, or joint effusion. There is no lytic or sclerotic osseous lesion. 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 There is mild patellofemoral osteoarthritis. There is peripheral vascular atherosclerotic disease. Soft tissues are unremarkable.  IMPRESSION: No acute osseous injury of the left knee.   Electronically Signed   By: Kathreen Devoid   On: 06/10/2014 18:37   Dg Knee 1-2 Views Right  06/10/2014   CLINICAL DATA:  Severe lower body pain. No known injury. Bilateral hip pain. Trouble walking. Right lateral knee pain. Left medial knee  pain.  EXAM: RIGHT KNEE - 1-2 VIEW  COMPARISON:  None.  FINDINGS: No acute fracture or dislocation. Moderate joint effusion. Tricompartmental osteoarthritis of the right knee most severe in the medial femorotibial compartment. Chondrocalcinosis of the lateral femorotibial compartment as can be seen with CPPD. There is peripheral vascular atherosclerotic disease.  IMPRESSION: No acute osseous injury of the right knee.  Tricompartmental osteoarthritis of the right knee.   Electronically Signed   By: Kathreen Devoid   On: 06/10/2014 18:27   Dg Ankle Complete Left  06/10/2014   CLINICAL DATA:  Lower body pain.  Trouble walking.  EXAM: LEFT ANKLE COMPLETE - 3+ VIEW  COMPARISON:  None.  FINDINGS: There is no evidence of fracture, dislocation, or joint effusion. The ankle mortise is intact. There is a plantar calcaneal spur. There are enthesopathic changes at the Achilles tendon insertion. There are degenerative changes of the talonavicular joint. There is mild osteoarthritis of the tibiotalar joint. There is mild osteoarthritis of the first TMT joint. There is peripheral vascular atherosclerotic disease. Soft tissues are unremarkable.  IMPRESSION: 1. No acute osseous injury of the left ankle.   Electronically Signed   By: Kathreen Devoid   On: 06/10/2014 18:35   Dg Ankle Complete Right  06/10/2014   CLINICAL DATA:  Lower body pain.  Trouble walking.  EXAM: RIGHT ANKLE - COMPLETE 3+ VIEW  COMPARISON:  None.  FINDINGS: There is no evidence of fracture, dislocation, or joint effusion. There is a distal fibular side plate with multiple interlocking screws without failure or complication. There is a medial malleolar screw and a medial malleolar K-wire without failure or complication. The ankle mortise is intact. There is a plantar calcaneal spur. There are enthesopathic changes at the Achilles tendon insertion. There are degenerative changes of the talonavicular joint. There is osteoarthritis of the tibiotalar joint. Soft  tissues are unremarkable.  IMPRESSION: 1. No acute osseous injury of the right ankle. 2. 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   Electronically Signed   By: Kathreen Devoid   On: 06/10/2014 18:34   Ct Chest Wo Contrast  06/11/2014   CLINICAL DATA:  Initial evaluation for a generalized pain, weakness, weight loss of 15 lb, past medical history of diabetes and hypertension as well as gout and heart failure  EXAM: CT CHEST, ABDOMEN AND PELVIS WITHOUT CONTRAST  TECHNIQUE: Multidetector CT imaging of the chest, abdomen and pelvis was performed following the standard protocol without IV contrast.  COMPARISON:  08/13/2013  FINDINGS: CT CHEST FINDINGS  Mild bilateral atelectasis. No significant parenchymal opacities otherwise.  Thyroid normal. Thoracic inlet normal. Calcification of the thoracic aorta. Moderate to severe coronary artery calcification. No significant pleural or pericardial effusion. No significant hilar or mediastinal adenopathy. 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  Stable latissimus dorsi 5 cm lipoma. No acute thoracic musculoskeletal findings.  CT ABDOMEN AND PELVIS FINDINGS  Liver is normal. Gallbladder is surgically absent. Spleen is normal. There is a stable 2 cm splenule in the hilum. Pancreas is normal.  Adrenal glands are normal. Significant perinephric inflammatory change bilaterally similar to prior study likely chronic. Bilateral renal cortical atrophy stable. 1 mm stone lower pole left kidney. Low attenuation 1.5 lesion lower pole left kidney not characterized without contrast but stable. 15 mm upper pole low-attenuation right renal lesions stable. 1 mm stone lower pole right kidney.  Increased attenuation in the bilateral flank subcutaneous soft tissues extending inferiorly into the pelvis. Foley catheter decompresses the bladder. Nonobstructive bowel gas pattern. Significant diverticulosis  involving the descending and sigmoid colon without evidence of diverticulitis. Fecal retention throughout the colon noted. Appendix is normal. Small bowel is normal. Stomach is normal.  No acute musculoskeletal findings in the abdomen or pelvis. Extensive calcification of the abdominal aorta without dilatation. Bilateral fat containing inguinal hernias. Trace free fluid layering dependently in the pelvis.  IMPRESSION: 1. No acute findings in the thorax 2. Bilateral subcutaneous flank edema possibly related to anasarca. Trace free fluid layering dependently in the pelvis appear 3. Stable indeterminate renal lesions, likely cysts although not characterized without contrast fully. 4. Diverticulosis.  Fecal retention.  Electronically Signed   By: Skipper Cliche M.D.   On: 06/11/2014 15:54   Nm Bone Scan Whole Body  06/10/2014   CLINICAL DATA:  Lower extremity weakness and pain. History of diabetes, hypertension and stroke. Indeterminate lesions in left iliac bone on MRI. No given history of malignancy.  EXAM: NUCLEAR MEDICINE WHOLE BODY BONE SCAN  TECHNIQUE: Whole body anterior and posterior images were obtained approximately 3 hours after intravenous injection of radiopharmaceutical.  RADIOPHARMACEUTICALS:  25.0 mCi Technetium-99 MDP  COMPARISON:  Lumbar spine and pelvic MRI 06/09/2014.  FINDINGS: There is no osseous activity suspicious for metastatic disease. Specifically, no abnormal activity is seen within the left iliac bone or remainder of the pelvis. There is scattered arthropathic activity within the large joints, primarily the shoulders, elbows and knees. There is also some arthropathic activity throughout the hands and feet bilaterally. Activity is present within the Foley catheter bag between lower legs.  IMPRESSION: No bone scan evidence of osseous metastatic disease. Scattered arthropathic activity.   Electronically Signed   By: Richardean Sale M.D.   On: 06/10/2014 15:42   Dg Hips Bilat With Pelvis  3-4 Views  06/10/2014   CLINICAL DATA:  Lower body pain. Trouble walking. Bilateral hip pain.  EXAM: BILATERAL HIP (WITH PELVIS) 3-4 VIEWS  COMPARISON:  None.  FINDINGS: No fracture or dislocation. Joint spaces are maintained. No lytic or sclerotic osseous lesion. Peripheral vascular atherosclerotic disease.  IMPRESSION: No acute osseous injury of bilateral hips.   Electronically Signed   By: Kathreen Devoid   On: 06/10/2014 18:38   . allopurinol  100 mg Oral Daily  . clopidogrel  75 mg Oral Daily  . colchicine  0.3 mg Oral Daily  . heparin  5,000 Units Subcutaneous 3 times per day  . insulin aspart  0-20 Units Subcutaneous TID WC  . [START ON 06/13/2014] insulin glargine  22 Units Subcutaneous Daily  . methylPREDNISolone (SOLU-MEDROL) injection  125 mg Intravenous Q24H  . pregabalin  75 mg Oral Daily  . psyllium  1 packet Oral QHS  . sertraline  50 mg Oral QHS    BMET    Component Value Date/Time   NA 134* 06/11/2014 0400   K 4.7 06/11/2014 0400   CL 101 06/11/2014 0400   CO2 25 06/11/2014 0400   GLUCOSE 145* 06/11/2014 0400   BUN 138* 06/11/2014 0400   CREATININE 2.98* 06/11/2014 2004   CALCIUM 7.5* 06/11/2014 0400   GFRNONAA 19* 06/11/2014 2004   GFRAA 22* 06/11/2014 2004   CBC    Component Value Date/Time   WBC 9.1 06/11/2014 0400   RBC 4.04* 06/11/2014 0400   HGB 11.8* 06/11/2014 0400   HCT 34.0* 06/11/2014 0400   PLT 330 06/11/2014 0400   MCV 84.2 06/11/2014 0400   MCH 29.2 06/11/2014 0400   MCHC 34.7 06/11/2014 0400   RDW 12.6 06/11/2014 0400   LYMPHSABS 0.3* 06/07/2014 0653   MONOABS 0.6 06/07/2014 0653   EOSABS 0.0 06/07/2014 0653   BASOSABS 0.0 06/07/2014 0653     Assessment/Plan:  1. AKI/CKD- in setting of decompensated CHF and hypotension. Non-oliguric. BUN rising out of proportion to Scr,presumably due to prednisone therapy and diuresis. Will need to follow closely for any signs of uremia. Discussed the chronicity and severity of his CKD with he and his  wife. He would be willing to proceed with HD if indicated 1. Scr actually continues to slowly improve.  2. Hyperkalemia- resolved 3. Hyponatremia- as above, pt with edema, and  improved after IV Lasix x 1 and continues to improve with returning renal function and auto-diuresis. 4. Acute on chronic systolic CHF- improved edema and weight without more lasix at this point. Continue to follow. 5. Lower extremity weakness- PT/OT. Workup per Cards and Primary svc 1. MRI of LS spine revealed some bulging disc and some spinal stenosis. Also concern over bone marrow abnormalities. awaiting Neuro evaluation 6. Severe protein malnutrition 7. SHPTH- await repeat Phos levels and iPTH but will likely require phosphate binders. 8. 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 Arthralgias/myalgias- presumably polyarticular gout 1. Focus on pain relief so he can cooperate with PT 2. Consider pain management consult 3. Start colchicine and follow Scr and LFT's closely as well as CBC  Kathrina Crosley A

## 2014-06-12 NOTE — Progress Notes (Signed)
Occupational Therapy Assessment and Plan  Patient Details  Name: Darrell Richardson MRN: [id number] Date of Birth: 04-Jan-1941  OT Diagnosis: acute pain and pain in joint Rehab Potential: Rehab Potential (ACUTE ONLY): Good ELOS: 3-4 seeks   Today's Date: 06/12/2014 OT Individual Time: 0830-0930  (60 min) OT Individual Time Calculation (min):   60  min     Problem List:  Patient Active Problem List   Diagnosis Date Noted  . Chronic combined systolic and diastolic heart failure 74/27/7412  . ESR raised 06/12/2014  . Elevated PSA 06/12/2014  . Debilitated 06/11/2014  . Palliative care encounter 06/09/2014  . Myalgia   . Polyarthralgia   . Primary gout   . Secondary cardiomyopathy   . Diabetes type 2, uncontrolled   . History of CVA (cerebrovascular accident)   . CKD (chronic kidney disease), stage IV 05/31/2014  . Hypertension     Past Medical History:  Past Medical History  Diagnosis Date  . Hearing loss   . Gout   . Diabetes   . Fatigue   . Hypertension   . Stroke     X 2  . Heart failure   . Muscle pain   . Hammertoe    Past Surgical History:  Past Surgical History  Procedure Laterality Date  . Stomach surgery    . Foot surgery Right   . Tee without cardioversion N/A 06/07/2014    Procedure: TRANSESOPHAGEAL ECHOCARDIOGRAM (TEE);  Surgeon: Jolaine Artist, MD;  Location: [facility] ENDOSCOPY;  Service: Cardiovascular;  Laterality: N/A;    Assessment & Plan Clinical Impression:  HPI: Darrell Richardson is a 74 y.o. right handed male with history of CVA with residual left hemiparesis, diabetes mellitus peripheral neuropathy, gout, diastolic congestive heart failure, chronic renal insufficiency 2.44. Lives with his wife and used a cane prior to admission. Presented to [facility] 05/31/2014 with increasing nonspecific chest pain and shortness of breath and transferred to [facility] for further medical evaluation. Complaints of increased shortness of breath  and lower extremity edema. He was found to have acute respiratory failure secondary to acute CHF exacerbation placed on BiPAP. Troponin was negative. BNP 7363 Patient placed on Lasix. Echocardiogram with ejection fraction of 45% with akinesis of the inferior lateral and inferior myocardium. Progressive increase in creatinine function from baseline 2.4-5.37 Renal service is consulted. Renal ultrasound showed no hydronephrosis and latest creatinine 4.02. Complaints of joint pain low-grade fever with ESR 135 as well as uric acid level 8.3. Patient had been placed on Solu-Medrol with workup concerning for PMR versus RA. It was felt prednisone was inducing increased renal function. TEE completed 06/07/2014 showing no vegetation moderate sized PFO with left-to-right shunting at rest. Full workups remain underway with ANCA studies normal.. Palliative care has been consult to establish goals of care. MRI lumbar spine revealed some bulging disc and some spinal stenosis. Also concern over bone marrow abnormalities with neurology services to follow-up with bone scan completed for clarity that showed no evidence of osseous metastatic disease 06/10/2014. Subcutaneous heparin for DVT prophylaxis. Physical therapy evaluation completed 06/05/2014 with recommendations of physical medicine rehabilitation consult.Patient was admitted for a comprehensive rehab program.  Patient transferred to CIR on 06/11/2014 .    Patient currently requires total with basic self-care skills secondary to muscle weakness and muscle joint tightness, decreased cardiorespiratoy endurance and abnormal tone, decreased coordination and decreased motor planning.  Prior to hospitalization, patient could complete BADL with  supervision.  Patient will benefit from skilled  intervention to increase independence with basic self-care skills prior to discharge may need ST SNF.  Anticipate patient will require 24 hour supervision and follow up home health.  OT -  End of Session Endurance Deficit: Yes Endurance Deficit Description: cardiorespiratory OT Assessment Rehab Potential (ACUTE ONLY): Good Barriers to Discharge: Decreased caregiver support Barriers to Discharge Comments:  (may need SNF after CIR) OT Patient demonstrates impairments in the following area(s): Balance;Cognition;Edema;Endurance;Motor;Pain;Safety;Skin Integrity OT Basic ADL's Functional Problem(s): Grooming;Eating;Bathing;Dressing;Toileting OT Transfers Functional Problem(s): Toilet;Tub/Shower OT Additional Impairment(s): None;Fuctional Use of Upper Extremity OT Plan OT Intensity: Minimum of 1-2 x/day, 45 to 90 minutes OT Frequency: 5 out of 7 days OT Duration/Estimated Length of Stay: 3-4 seeks OT Treatment/Interventions: Balance/vestibular training;Cognitive remediation/compensation;Discharge planning;DME/adaptive equipment instruction;Functional mobility training;Pain management;Patient/family education;Self Care/advanced ADL retraining;Therapeutic Activities;Therapeutic Exercise;UE/LE Strength taining/ROM;UE/LE Coordination activities;Wheelchair propulsion/positioning OT Self Feeding Anticipated Outcome(s): independent OT Basic Self-Care Anticipated Outcome(s): min assist OT Toileting Anticipated Outcome(s): minimal assist OT Bathroom Transfers Anticipated Outcome(s): minimal assist OT Recommendation Patient destination: [facility] (SNF) Follow Up Recommendations: Home health OT;Skilled nursing facility Equipment Recommended: Tub/shower bench;3 in 1 bedside comode   Skilled Therapeutic Intervention Time: 2nd session:    1300-1330   (30 min) Pain:  5/10  back Individual session Addressed UE AROM, sitting EOB for vitals.  Discussed POC and ELOS.     OT Evaluation Precautions/Restrictions  Precautions Precautions: Fall Precaution Comments: WBAT on R leg (knee pain due to gout; also in hands, toes) Restrictions Weight Bearing Restrictions: No    Vital  Signs  :  161/59, 55, 92% supine;  160/78, 55  sitting  Pain Pain Assessment Pain Assessment: 0-10 Pain Score: 6  Pain Type: Chronic pain Pain Location: Leg Pain Orientation: Right;Left Pain Descriptors / Indicators: Sore Pain Onset: With Activity Pain Intervention(s): Medication (See eMAR) Home Living/Prior Functioning Home Living Available Help at Discharge: Family Type of Home: House Home Access: Ramped entrance Home Layout: One level Additional Comments: Dwight for ambulateion; has 2WW but di not use; has rollator walker  Lives With: Spouse IADL History Homemaking Responsibilities: Yes Meal Prep Responsibility: Secondary Laundry Responsibility: Secondary Cleaning Responsibility: Secondary (assists wife with all duties; pt does yard wk) Financial controller Responsibility: Primary (pt writes checks for bills) Shopping Responsibility: Secondary Mode of Transportation: Car (was driving 2 weeks ago) Occupation: Retired Tax adviser: sedentary Prior Function Level of Independence: Independent with basic ADLs, Independent with homemaking with ambulation, Independent with gait, Independent with transfers  Able to Take Stairs?: Yes Driving: Yes Vocation: Retired ADL   Vision/Perception  Vision- Risk analyst: Within Designer, television/film set Perception: Impaired  Cognition Overall Cognitive Status: Within Functional Limits for tasks assessed Orientation Level: Oriented X4 Attention: Focused;Sustained Focused Attention: Appears intact Sustained Attention: Impaired Sustained Attention Impairment: Verbal complex Safety/Judgment: Impaired Sensation Sensation Light Touch: Appears Intact (For BUE) Additional Comments: left foot and right knee pain Coordination Gross Motor Movements are Fluid and Coordinated: Yes Fine Motor Movements are Fluid and Coordinated: No Motor  Motor Motor: Abnormal postural alignment and control Motor - Skilled Clinical  Observations: retropulsive in sitting despite multiple cues to lean forward from PT Mobility  Bed Mobility Bed Mobility: Rolling Right;Rolling Left;Left Sidelying to Sit Rolling Right: 3: Mod assist Rolling Right Details: Manual facilitation for placement;Verbal cues for technique Rolling Left: 2: Max assist Rolling Left Details: Manual facilitation for placement;Verbal cues for technique Left Sidelying to Sit: 3: Mod assist Left Sidelying to Sit Details: Manual facilitation for placement;Verbal cues for technique Sit to  Supine: 1: +1 Total assist Sit to Supine - Details: Manual facilitation for placement  Trunk/Postural Assessment  Cervical Assessment Cervical Assessment: Within Functional Limits Thoracic Assessment Thoracic Assessment: Exceptions to [facility] Thoracic AROM Overall Thoracic AROM: Deficits;Due to premorid status Overall Thoracic AROM Comments: limited thoracic extension - slouched positioning chronic in nature Lumbar Assessment Lumbar Assessment: Exceptions to [facility] Lumbar AROM Overall Lumbar AROM: Deficits;Due to premorid status Overall Lumbar AROM Comments: limited into all planes of motion: flexion, rotation, extension Postural Control Postural Control: Deficits on evaluation Head Control: head postured in down and forward position - able to lift it up, but poor muscular endurance Trunk Control: retropulsive trunk in sit - unable to achieve midline trunk sitting position (A-P) Righting Reactions: delayed Postural Limitations: slouched & retropulsive posture; head forward & down  Balance Balance Balance Assessed: Yes Static Sitting Balance Static Sitting - Balance Support: Bilateral upper extremity supported;Feet supported Static Sitting - Level of Assistance: 4: Min assist Dynamic Sitting Balance Dynamic Sitting - Balance Support: Bilateral upper extremity supported;During functional activity;Feet supported Dynamic Sitting - Level of Assistance: 1: +2 Total  assist Dynamic Sitting - Balance Activities: Forward lean/weight shifting Sitting balance - Comments: Sat EOB x 15 minutes with min A initially. When holding stedy pt able to maintain with min guard. Extremity/Trunk Assessment RUE Assessment RUE Assessment: Exceptions to [facility] RUE AROM (degrees) Overall AROM Right Upper Extremity: Within functional limits for tasks performed RUE Strength RUE Overall Strength: Deficits RUE Overall Strength Comments: arm flexion 4/5, elbow & grip 5/5 Right Shoulder Extension: 4-/5 LUE Assessment LUE Assessment: Exceptions to WFL LUE AROM (degrees) Overall AROM Left Upper Extremity: Deficits LUE Strength Left Shoulder Flexion: 4+/5  FIM:  FIM - Eating Eating Activity: 5: Set-up assist for open containers FIM - Grooming Grooming: 1: Patient completes 0 of 4 or 1 of 5 steps, or requires 2 helpers FIM - Bathing Bathing: 2: Max-Patient completes 3-4 [id number] parts or 25-49% FIM - Upper Body Dressing/Undressing Upper body dressing/undressing steps patient completed: Thread/unthread right sleeve of pullover shirt/dresss;Thread/unthread left sleeve of pullover shirt/dress Upper body dressing/undressing: 2: Max-Patient completed 25-49% of tasks FIM - Lower Body Dressing/Undressing Lower body dressing/undressing: 1: Total-Patient completed less than 25% of tasks FIM - Toileting Toileting: 1: Total-Patient completed zero steps, helper did all 3 FIM - Bed/Chair Transfer Bed/Chair Transfer: 2: Bed > Chair or W/C: Max A (lift and lower assist);0: Activity did not occur FIM - TAir cabin crewTransfers: 0-Activity did not occur FIM - Tub/Shower Transfers Tub/shower Transfers: 0-Activity did not occur or was simulated   Refer to Care Plan for Long Term Goals  Recommendations for other services: None  Discharge Criteria: Patient will be discharged from OT if patient refuses treatment 3 consecutive times without medical reason, if treatment goals not met,  if there is a change in medical status, if patient makes no progress towards goals or if patient is discharged from hospital.  The above assessment, treatment plan, treatment alternatives and goals were discussed and mutually agreed upon: by patient  ELisa Roca3/08/2014, 7:28 PM

## 2014-06-12 NOTE — Progress Notes (Signed)
Orthopedic Tech Progress Note Patient Details:  Darrell Richardson 27-Apr-1940 [id number]  Patient ID: Darrell Richardson, male   DOB: 1941-03-02, 74 y.o.   MRN: [id number] Viewed order from doctor's order list  Hildred Priest 06/12/2014, 11:51 AM

## 2014-06-12 NOTE — Progress Notes (Signed)
74 y.o. right handed male with history of CVA with residual left hemiparesis, diabetes mellitus peripheral neuropathy, gout, diastolic congestive heart failure, chronic renal insufficiency 2.44. Lives with his wife and used a cane prior to admission. Presented to [facility] 05/31/2014 with increasing nonspecific chest pain and shortness of breath and transferred to [facility] for further medical evaluation. Complaints of increased shortness of breath and lower extremity edema. He was found to have acute respiratory failure secondary to acute CHF exacerbation placed on BiPAP. Troponin was negative. BNP 7363 Patient placed on Lasix. Echocardiogram with ejection fraction of 45% with akinesis of the inferior lateral and inferior myocardium. Progressive increase in creatinine function from baseline 2.4-5.37 Renal service is consulted. Renal ultrasound showed no hydronephrosis and latest creatinine 4.02. Complaints of joint pain low-grade fever with ESR 135 as well as uric acid level 8.3. Patient had been placed on Solu-Medrol with workup concerning for PMR versus RA. It was felt prednisone was inducing increased renal function. TEE completed 06/07/2014 showing no vegetation moderate sized PFO with left-to-right shunting at rest. Full workups remain underway with ANCA studies normal.. Palliative care has been consult to establish goals of care. MRI lumbar spine revealed some bulging disc and some spinal stenosis. Also concern over bone marrow abnormalities with neurology services to follow-up with bone scan completed for clarity that showed no evidence of osseous metastatic disease 06/10/2014  Subjective/Complaints: Right knee pain and Left ankle pain Pt feelos generally pain is improving  Objective: Vital Signs: Blood pressure 170/69, pulse 65, temperature 98 F (36.7 C), temperature source Oral, resp. rate 17, height _0  (1.727 m), weight 102.4 kg (225 lb 12 oz), SpO2 97 %. Ct  Abdomen Pelvis Wo Contrast  06/11/2014   CLINICAL DATA:  Initial evaluation for a generalized pain, weakness, weight loss of 15 lb, past medical history of diabetes and hypertension as well as gout and heart failure  EXAM: CT CHEST, ABDOMEN AND PELVIS WITHOUT CONTRAST  TECHNIQUE: Multidetector CT imaging of the chest, abdomen and pelvis was performed following the standard protocol without IV contrast.  COMPARISON:  08/13/2013  FINDINGS: CT CHEST FINDINGS  Mild bilateral atelectasis. No significant parenchymal opacities otherwise.  Thyroid normal. Thoracic inlet normal. Calcification of the thoracic aorta. Moderate to severe coronary artery calcification. No significant pleural or pericardial effusion. No significant hilar or mediastinal adenopathy. 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  Stable latissimus dorsi 5 cm lipoma. No acute thoracic musculoskeletal findings.  CT ABDOMEN AND PELVIS FINDINGS  Liver is normal. Gallbladder is surgically absent. Spleen is normal. There is a stable 2 cm splenule in the hilum. Pancreas is normal.  Adrenal glands are normal. Significant perinephric inflammatory change bilaterally similar to prior study likely chronic. Bilateral renal cortical atrophy stable. 1 mm stone lower pole left kidney. Low attenuation 1.5 lesion lower pole left kidney not characterized without contrast but stable. 15 mm upper pole low-attenuation right renal lesions stable. 1 mm stone lower pole right kidney.  Increased attenuation in the bilateral flank subcutaneous soft tissues extending inferiorly into the pelvis. Foley catheter decompresses the bladder. Nonobstructive bowel gas pattern. Significant diverticulosis involving the descending and sigmoid colon without evidence of diverticulitis. Fecal retention throughout the colon noted. Appendix is normal. Small bowel is normal. Stomach is normal.  No acute musculoskeletal  findings in the abdomen or pelvis. Extensive calcification of the abdominal aorta without dilatation. Bilateral fat containing inguinal hernias. Trace free fluid layering  dependently in the pelvis.  IMPRESSION: 1. No acute findings in the thorax 2. Bilateral subcutaneous flank edema possibly related to anasarca. Trace free fluid layering dependently in the pelvis appear 3. Stable indeterminate renal lesions, likely cysts although not characterized without contrast fully. 4. Diverticulosis.  Fecal retention.   Electronically Signed   By: Skipper Cliche M.D.   On: 06/11/2014 15:54   Dg Knee 1-2 Views Left  06/10/2014   CLINICAL DATA:  Left medial knee pain.  Trouble walking.  EXAM: LEFT KNEE - 1-2 VIEW  COMPARISON:  None.  FINDINGS: There is no evidence of fracture, dislocation, or joint effusion. There is no lytic or sclerotic osseous lesion. 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 There is mild patellofemoral osteoarthritis. There is peripheral vascular atherosclerotic disease. Soft tissues are unremarkable.  IMPRESSION: No acute osseous injury of the left knee.   Electronically Signed   By: Kathreen Devoid   On: 06/10/2014 18:37   Dg Knee 1-2 Views Right  06/10/2014   CLINICAL DATA:  Severe lower body pain. No known injury. Bilateral hip pain. Trouble walking. Right lateral knee pain. Left medial knee pain.  EXAM: RIGHT KNEE - 1-2 VIEW  COMPARISON:  None.  FINDINGS: No acute fracture or dislocation. Moderate joint effusion. Tricompartmental osteoarthritis of the right knee most severe in the medial femorotibial compartment. Chondrocalcinosis of the lateral femorotibial compartment as can be seen with CPPD. There is peripheral vascular atherosclerotic disease.  IMPRESSION: No acute osseous injury of the right knee.  Tricompartmental osteoarthritis of the right knee.   Electronically Signed   By: Kathreen Devoid   On: 06/10/2014 18:27   Dg Ankle Complete  Left  06/10/2014   CLINICAL DATA:  Lower body pain.  Trouble walking.  EXAM: LEFT ANKLE COMPLETE - 3+ VIEW  COMPARISON:  None.  FINDINGS: There is no evidence of fracture, dislocation, or joint effusion. The ankle mortise is intact. There is a plantar calcaneal spur. There are enthesopathic changes at the Achilles tendon insertion. There are degenerative changes of the talonavicular joint. There is mild osteoarthritis of the tibiotalar joint. There is mild osteoarthritis of the first TMT joint. There is peripheral vascular atherosclerotic disease. Soft tissues are unremarkable.  IMPRESSION: 1. No acute osseous injury of the left ankle.   Electronically Signed   By: Kathreen Devoid   On: 06/10/2014 18:35   Dg Ankle Complete Right  06/10/2014   CLINICAL DATA:  Lower body pain.  Trouble walking.  EXAM: RIGHT ANKLE - COMPLETE 3+ VIEW  COMPARISON:  None.  FINDINGS: There is no evidence of fracture, dislocation, or joint effusion. There is a distal fibular side plate with multiple interlocking screws without failure or complication. There is a medial malleolar screw and a medial malleolar K-wire without failure or complication. The ankle mortise is intact. There is a plantar calcaneal spur. There are enthesopathic changes at the Achilles tendon insertion. There are degenerative changes of the talonavicular joint. There is osteoarthritis of the tibiotalar joint. Soft tissues are unremarkable.  IMPRESSION: 1. No acute osseous injury of the right ankle. 2. 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   Electronically Signed   By: Kathreen Devoid   On: 06/10/2014 18:34   Ct Chest Wo Contrast  06/11/2014   CLINICAL DATA:  Initial evaluation for a generalized pain, weakness, weight loss of 15 lb, past medical history of diabetes and hypertension as well as gout and heart failure  EXAM: CT CHEST, ABDOMEN AND PELVIS WITHOUT CONTRAST  TECHNIQUE: Multidetector CT imaging of the chest, abdomen and pelvis was  performed following the standard protocol without IV contrast.  COMPARISON:  08/13/2013  FINDINGS: CT CHEST FINDINGS  Mild bilateral atelectasis. No significant parenchymal opacities otherwise.  Thyroid normal. Thoracic inlet normal. Calcification of the thoracic aorta. Moderate to severe coronary artery calcification. No significant pleural or pericardial effusion. No significant hilar or mediastinal adenopathy. 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  Stable latissimus dorsi 5 cm lipoma. No acute thoracic musculoskeletal findings.  CT ABDOMEN AND PELVIS FINDINGS  Liver is normal. Gallbladder is surgically absent. Spleen is normal. There is a stable 2 cm splenule in the hilum. Pancreas is normal.  Adrenal glands are normal. Significant perinephric inflammatory change bilaterally similar to prior study likely chronic. Bilateral renal cortical atrophy stable. 1 mm stone lower pole left kidney. Low attenuation 1.5 lesion lower pole left kidney not characterized without contrast but stable. 15 mm upper pole low-attenuation right renal lesions stable. 1 mm stone lower pole right kidney.  Increased attenuation in the bilateral flank subcutaneous soft tissues extending inferiorly into the pelvis. Foley catheter decompresses the bladder. Nonobstructive bowel gas pattern. Significant diverticulosis involving the descending and sigmoid colon without evidence of diverticulitis. Fecal retention throughout the colon noted. Appendix is normal. Small bowel is normal. Stomach is normal.  No acute musculoskeletal findings in the abdomen or pelvis. Extensive calcification of the abdominal aorta without dilatation. Bilateral fat containing inguinal hernias. Trace free fluid layering dependently in the pelvis.  IMPRESSION: 1. No acute findings in the thorax 2. Bilateral subcutaneous flank edema possibly related to anasarca. Trace free fluid layering  dependently in the pelvis appear 3. Stable indeterminate renal lesions, likely cysts although not characterized without contrast fully. 4. Diverticulosis.  Fecal retention.   Electronically Signed   By: Skipper Cliche M.D.   On: 06/11/2014 15:54   Nm Bone Scan Whole Body  06/10/2014   CLINICAL DATA:  Lower extremity weakness and pain. History of diabetes, hypertension and stroke. Indeterminate lesions in left iliac bone on MRI. No given history of malignancy.  EXAM: NUCLEAR MEDICINE WHOLE BODY BONE SCAN  TECHNIQUE: Whole body anterior and posterior images were obtained approximately 3 hours after intravenous injection of radiopharmaceutical.  RADIOPHARMACEUTICALS:  25.0 mCi Technetium-99 MDP  COMPARISON:  Lumbar spine and pelvic MRI 06/09/2014.  FINDINGS: There is no osseous activity suspicious for metastatic disease. Specifically, no abnormal activity is seen within the left iliac bone or remainder of the pelvis. There is scattered arthropathic activity within the large joints, primarily the shoulders, elbows and knees. There is also some arthropathic activity throughout the hands and feet bilaterally. Activity is present within the Foley catheter bag between lower legs.  IMPRESSION: No bone scan evidence of osseous metastatic disease. Scattered arthropathic activity.   Electronically Signed   By: Richardean Sale M.D.   On: 06/10/2014 15:42   Dg Hips Bilat With Pelvis 3-4 Views  06/10/2014   CLINICAL DATA:  Lower body pain. Trouble walking. Bilateral hip pain.  EXAM: BILATERAL HIP (WITH PELVIS) 3-4 VIEWS  COMPARISON:  None.  FINDINGS: No fracture or dislocation. Joint spaces are maintained. No lytic or sclerotic osseous lesion. Peripheral vascular atherosclerotic disease.  IMPRESSION: No acute osseous injury of bilateral hips.   Electronically Signed   By: Kathreen Devoid   On: 06/10/2014 18:38   Results for orders placed or performed during the  hospital encounter of 06/11/14 (from the past 72 hour(s))   Creatinine, serum     Status: Abnormal   Collection Time: 06/11/14  8:04 PM  Result Value Ref Range   Creatinine, Ser 2.98 (H) 0.50 - 1.35 mg/dL   GFR calc non Af Amer 19 (L) >90 mL/min   GFR calc Af Amer 22 (L) >90 mL/min    Comment: (NOTE) The eGFR has been calculated using the CKD EPI equation. This calculation has not been validated in all clinical situations. eGFR's persistently <90 mL/min signify possible Chronic Kidney Disease.   Glucose, capillary     Status: Abnormal   Collection Time: 06/11/14 10:36 PM  Result Value Ref Range   Glucose-Capillary 242 (H) 70 - 99 mg/dL  Glucose, capillary     Status: Abnormal   Collection Time: 06/12/14  8:04 AM  Result Value Ref Range   Glucose-Capillary 170 (H) 70 - 99 mg/dL     HEENT: normal Cardio: RRR and no murmur Resp: CTA B/L and unlabored GI: BS positive and NT, ND Extremity:  Edema scrotal and pretibial edema Skin:   Other erythema around scrotum Neuro: Alert/Oriented, Anxious, Normal Sensory and Abnormal Motor 4/5 bilateral delt bi tri grip, 3- bilateral HF, KE, ADF APF Musc/Skel:  Other Right knee pain with ROM, min effusion, no erythema, Left ankle pain with ROM peripheral edema no erythema Gen NAD   Assessment/Plan: 1. 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 Physiatrist is providing close team supervision and 24 hour management of active medical problems listed below. Physiatrist and rehab team continue to assess barriers to discharge/monitor patient progress toward functional and medical goals. Given multiple medical issues , will as IM to consult and follow along with [place] FIM:                   Comprehension Comprehension Mode: Auditory Comprehension: 7-Follows complex conversation/direction: With no assist  Expression Expression Mode:  Verbal Expression: 6-Expresses complex ideas: With extra time/assistive device  Social Interaction Social Interaction: 5-Interacts appropriately 90% of the time - Needs monitoring or encouragement for participation or interaction.  Problem Solving Problem Solving Mode: Not assessed  Memory Memory: 5-Recognizes or recalls 90% of the time/requires cueing < 10% of the time  Medical Problem List and Plan: 1. Functional deficits secondary to debilitation after CHF exacerbation/respiratory failure/question polyarticular gout. 2.  DVT Prophylaxis/Anticoagulation: Subcutaneous heparin. Monitor platelet counts and any signs of bleeding 3. Pain Management: Lyrica 75 mg daily, Hydrocodone as needed. Monitor with increased mobility 4. Mood/depression. Zoloft 50 mg daily. Provide emotional support 5. Neuropsych: This patient is capable of making decisions on his own behalf. 6. Skin/Wound Care: Routine skin checks 7. Fluids/Electrolytes/Nutrition: Strict I&O with follow-up chemistries 8. Chronic renal insufficiency with baseline creatinine 2.44 since increased to 5.37 and stabilizing. It was felt elevation possibly due to prednisone therapy as well as in the setting of decompensated CHF and hypotension. Renal service to follow-up 9. 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 No significant improvement with steroids. Negative TEE. Negative anti-histone DNA. Await further studies. Continue Solu-Medrol for now and plan Taper as we await further studies 10. Acute on chronic diastolic congestive heart failure. Continue to monitor with any signs of fluid overload. Lasix has since been discontinued 11. Diabetes mellitus with peripheral neuropathy. Monitor closely while on Solu-Medrol. Hemoglobin A1c 7.5. Lantus insulin 18 units daily. Check blood sugars before meals and at bedtime  12. History of CVA with left hemiparesis. Continue Plavix 13.  Elevated PSA pt has had prostate biopsy about 47yr ago, ?f/u with urology  as outpt gien all the medical issues currently in play LOS (Days) 1 A FACE TO [facility]E 06/12/2014, 9:15 AM

## 2014-06-13 ENCOUNTER — Inpatient Hospital Stay (HOSPITAL_COMMUNITY): Payer: Medicare Other | Admitting: Physical Therapy

## 2014-06-13 DIAGNOSIS — I159 Secondary hypertension, unspecified: Secondary | ICD-10-CM

## 2014-06-13 DIAGNOSIS — R972 Elevated prostate specific antigen [PSA]: Secondary | ICD-10-CM

## 2014-06-13 DIAGNOSIS — I5042 Chronic combined systolic (congestive) and diastolic (congestive) heart failure: Secondary | ICD-10-CM

## 2014-06-13 DIAGNOSIS — M2550 Pain in unspecified joint: Secondary | ICD-10-CM

## 2014-06-13 DIAGNOSIS — N184 Chronic kidney disease, stage 4 (severe): Secondary | ICD-10-CM

## 2014-06-13 DIAGNOSIS — R700 Elevated erythrocyte sedimentation rate: Secondary | ICD-10-CM

## 2014-06-13 LAB — GLUCOSE, CAPILLARY
GLUCOSE-CAPILLARY: 178 mg/dL — AB (ref 70–99)
Glucose-Capillary: 130 mg/dL — ABNORMAL HIGH (ref 70–99)
Glucose-Capillary: 154 mg/dL — ABNORMAL HIGH (ref 70–99)
Glucose-Capillary: 159 mg/dL — ABNORMAL HIGH (ref 70–99)

## 2014-06-13 MED ORDER — TRAMADOL-ACETAMINOPHEN 37.5-325 MG PO TABS: 1.0000 | ORAL_TABLET | Freq: Four times a day (QID) | ORAL | Status: DC | PRN

## 2014-06-13 MED ORDER — COLCHICINE 0.6 MG PO TABS
0.3000 mg | ORAL_TABLET | Freq: Every day | ORAL | Status: DC
  Administered 2014-06-13 – 2014-06-15 (×3): 0.3 mg via ORAL
  Filled 2014-06-13 (×4): qty 0.5

## 2014-06-13 MED ORDER — PREDNISONE 20 MG PO TABS
30.0000 mg | ORAL_TABLET | Freq: Every day | ORAL | Status: DC
  Administered 2014-06-14: 30 mg via ORAL
  Filled 2014-06-13 (×2): qty 1

## 2014-06-13 MED ORDER — AMOXICILLIN-POT CLAVULANATE 500-125 MG PO TABS
1.0000 | ORAL_TABLET | Freq: Two times a day (BID) | ORAL | Status: DC
  Administered 2014-06-13 – 2014-06-15 (×5): 500 mg via ORAL
  Filled 2014-06-13 (×8): qty 1

## 2014-06-13 NOTE — Progress Notes (Signed)
Physical Therapy Session Note  Patient Details  Name: Darrell Richardson MRN: [id number] Date of Birth: 05/30/1940  Today's Date: 06/13/2014 PT Individual Time: 6378-5885 PT Individual Time Calculation (min): 60 min   Short Term Goals: Week 1:  PT Short Term Goal 1 (Week 1): Pt will demonstrate rolling R and L in flat bed without rail req min A.  PT Short Term Goal 2 (Week 1): Pt will demonstrate supine to/from sit transfer req mod A each direction.  PT Short Term Goal 3 (Week 1): Pt will be a 1 person transfer from bed to/from w/c.  PT Short Term Goal 4 (Week 1): Pt will initiate standing during PT session (standing frame or with walker) PT Short Term Goal 5 (Week 1): Pt will self propel manual w/c x 50' req SBA on level surface.   Skilled Therapeutic Interventions/Progress Updates:   Patient received semi reclined in bed, wife present for session. Session focused on bed mobility, transfers, OOB tolerance, and patient/family education. Patient performed rolling to R and L multiple times using bed rails with overall min A in order to don pants with total A. When therapist attempted to bend R knee to assist with rolling, patient shouting out in pain due to gout. Remainder of rolling completed without using BLE to assist. Patient noted to be incontinent of bladder, therefore performed rolling in order to change brief with total A for clothing management and hygiene. Patient with order for thigh high TED hose, therapist obtained hose and donned with total A and increased time due to patient c/o pain in bilateral feet. MD provided verbal order for OOB therapy. Supine BP 143/60. Patient transferred supine > sit with HOB flat and use of rail with overall min A and verbal cues for technique. Patient noted surprise with ease of transfer compared to yesterday. Therapist was unable to don abdominal binder due to body habitus, but continued with OOB activity as pt was asymptomatic and not orthostatic. Seated edge of  bed, BP 126/53. Patient performed slide board transfer bed > wheelchair with max A x 1 and +2 assist with wife stabilizing wheelchair, assist for placing slide board. After 5 minutes, seated in wheelchair BP 143/60. Patient and wife re-educated on out of bed tolerance and filling out monitoring sheet posted in room. Patient and wife educated on pressure relief techniques, frequency, and duration. Patient was unable to boost effectively with wheelchair pushup, therefore educated on forward and lateral leans for pressure relief. Patient with effective return demonstration of pressure relieving leans. Patient left sitting in wheelchair with wife present in room, needs within reach.   Therapy Documentation Precautions:  Precautions Precautions: Fall Precaution Comments: WBAT on R leg (knee pain due to gout; also in hands, toes) Restrictions Weight Bearing Restrictions: No  Pain: Pain Assessment Pain Assessment: Faces Pain Score: 6  Faces Pain Scale: Hurts even more Pain Type: Chronic pain Pain Location: Knee Pain Orientation: Left Pain Descriptors / Indicators: Grimacing;Moaning;Burning Pain Onset: With Activity Pain Intervention(s): Repositioned;Rest  See FIM for current functional status  Therapy/Group: Individual Therapy  Laretta Alstrom 06/13/2014, 10:06 AM

## 2014-06-13 NOTE — Progress Notes (Signed)
Patient Demographics  Darrell Richardson, is a 74 y.o. male, DOB - [birth year], VDI:[id number]  Adm joint pain improving joint pain improving, weakness and deconditioning weakness and deconditioningit date - 06/11/2014   Admitting Physician Charlett Blake, MD  Outpatient Primary MD for the patient is [name], MD  LOS - 2   No chief complaint on file.                                                             Consult follow-up note   Subjective:   Darrell Richardson today has, No headache, No chest pain, No abdominal pain - No Nausea, No new weakness tingling or numbness, No Cough - SOB. Joints pain improving.  Assessment & Plan    Active Problems:   Hypertension   CKD (chronic kidney disease), stage IV   Diabetes type 2, uncontrolled   Primary gout   Myalgia   Polyarthralgia   Debilitated   Chronic combined systolic and diastolic heart failure   ESR raised   Elevated PSA  Weakness and deconditioning: - This is most likely related to muscle disuse secondary to pain from polyarthralgia. - Management as per CIR team, main goal is to control pain prior to physical therapy sessions.  Polyarthralgia/gout flare - most likely related to gout, as per orthopedic consult. - paraneoplastic workup/autoimmune/infectious workup has been nonrevealing. - Significant improvement of pain after being started on steroids and colchicine . - Continue to taper steroids gradually. - Continue with colchicine, monitor closely given his renal function, avoid NSAIDs. - Continue with allopurinol  Acute on chronic renal failure - Continues to improve. - Nephrology are following.  Type 2 diabetes - Continue with Lantus, continue with insulin sliding scale  Cardiomyopathy /  chronic diastolic and systolic CHF - appears well compensated at present  - Will consider low dose Lasix  of renal function remains stable  History of CVA - Continue with Plavix  Urinary retention - Foley catheter discontinued, monitor for recurrence.  UTI - Started on Augmentin, follow on urine cultures.  Elevated PSA - Will need to follow with urology as an outpatient, has been followed by urology Dr. [name] in [place] before 10 years where he had a biopsy, most likely due to elevated PSA level and as well.     Code Status: Full  Family Communication: Multiple family members at bedsid           Medications  Scheduled Meds: . allopurinol  100 mg Oral Daily  . amoxicillin-clavulanate  1 tablet Oral Q12H  . clopidogrel  75 mg Oral Daily  . colchicine  0.3 mg Oral Daily  . heparin  5,000 Units Subcutaneous 3 times per day  . insulin aspart  0-20 Units Subcutaneous TID WC  . insulin glargine  22 Units Subcutaneous Daily  . [START ON 06/14/2014] predniSONE  30 mg Oral Q breakfast  . pregabalin  75 mg Oral Daily  . psyllium  1 packet Oral QHS  . sertraline  50 mg Oral QHS   Continuous Infusions:  PRN Meds:.[DISCONTINUED] acetaminophen **OR** acetaminophen, acetaminophen, HYDROcodone-acetaminophen, hydrocortisone cream, ipratropium-albuterol, methocarbamol, ondansetron **OR** ondansetron (ZOFRAN) IV, polyethylene glycol, sorbitol, traMADol-acetaminophen  DVT Prophylaxis  Heparin  Lab Results  Component Value Date   PLT 330 06/11/2014    Antibiotics    Anti-infectives    Start     Dose/Rate Route Frequency Ordered Stop   06/13/14 0815  amoxicillin-clavulanate (AUGMENTIN) 500-125 MG per tablet 500 mg    Comments:  MD aware of Rocephin allergy, would like to proceed with treatment   1 tablet Oral Every 12 hours 06/13/14 0802     06/12/14 2000  amoxicillin-clavulanate (AUGMENTIN) 875-125 MG per tablet 1 tablet  Status:  Discontinued     1 tablet Oral Every 12 hours 06/12/14 1823 06/12/14 1826          Objective:   Filed Vitals:   06/12/14 1418 06/13/14 0520  06/13/14 1100 06/13/14 1421  BP: 163/62   143/62  Pulse: 58   59  Temp: 97.8 F (36.6 C) 97.5 F (36.4 C)  97.5 F (36.4 C)  TempSrc: Oral Oral  Oral  Resp: _0 Height:      Weight:   103.874 kg (229 lb)   SpO2: 100% 95%  98%    Wt Readings from Last 3 Encounters:  06/13/14 103.874 kg (229 lb)  06/11/14 103.874 kg (229 lb)  05/01/13 102.059 kg (225 lb)     Intake/Output Summary (Last 24 hours) at 06/13/14 1504 Last data filed at 06/13/14 1200  Gross per 24 hour  Intake    340 ml  Output      0 ml  Net    340 ml     Physical Exam  Awake Alert, Oriented X 3, No new F.N deficits, Normal affect [place].AT,PERRAL Supple Neck,No JVD, No cervical lymphadenopathy appriciated.  Symmetrical Chest wall movement, Good air movement bilaterally, CTAB RRR,No Gallops,Rubs or new Murmurs, No Parasternal Heave +ve B.Sounds, Abd Soft, No tenderness, No organomegaly appriciated, No rebound - guarding or rigidity. No Cyanosis, Clubbing or edema, No new Rash or bruise  , joint still tender to palpation, but no erythema, mainly bilateral ankles, right more than left.   Data Review   Micro Results No results found for this or any previous visit (from the past 240 hour(s)).  Radiology Reports Ct Abdomen Pelvis Wo Contrast  06/11/2014   CLINICAL DATA:  Initial evaluation for a generalized pain, weakness, weight loss of 15 lb, past medical history of diabetes and hypertension as well as gout and heart failure  EXAM: CT CHEST, ABDOMEN AND PELVIS WITHOUT CONTRAST  TECHNIQUE: Multidetector CT imaging of the chest, abdomen and pelvis was performed following the standard protocol without IV contrast.  COMPARISON:  08/13/2013  FINDINGS: CT CHEST FINDINGS  Mild bilateral atelectasis. No significant parenchymal opacities otherwise.  Thyroid normal. Thoracic inlet normal. Calcification of the thoracic aorta. Moderate to severe coronary artery calcification. No significant pleural or pericardial  effusion. No significant hilar or mediastinal adenopathy. 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  Stable latissimus dorsi 5 cm lipoma. No acute thoracic musculoskeletal findings.  CT ABDOMEN AND PELVIS FINDINGS  Liver is normal. Gallbladder is surgically absent. Spleen is normal. There is a stable 2 cm splenule in the hilum. Pancreas is normal.  Adrenal glands are normal. Significant perinephric inflammatory change bilaterally similar to prior study likely chronic. Bilateral renal cortical atrophy stable. 1 mm stone lower pole left kidney. Low  attenuation 1.5 lesion lower pole left kidney not characterized without contrast but stable. 15 mm upper pole low-attenuation right renal lesions stable. 1 mm stone lower pole right kidney.  Increased attenuation in the bilateral flank subcutaneous soft tissues extending inferiorly into the pelvis. Foley catheter decompresses the bladder. Nonobstructive bowel gas pattern. Significant diverticulosis involving the descending and sigmoid colon without evidence of diverticulitis. Fecal retention throughout the colon noted. Appendix is normal. Small bowel is normal. Stomach is normal.  No acute musculoskeletal findings in the abdomen or pelvis. Extensive calcification of the abdominal aorta without dilatation. Bilateral fat containing inguinal hernias. Trace free fluid layering dependently in the pelvis.  IMPRESSION: 1. No acute findings in the thorax 2. Bilateral subcutaneous flank edema possibly related to anasarca. Trace free fluid layering dependently in the pelvis appear 3. Stable indeterminate renal lesions, likely cysts although not characterized without contrast fully. 4. Diverticulosis.  Fecal retention.   Electronically Signed   By: Skipper Cliche M.D.   On: 06/11/2014 15:54   Ct Chest Wo Contrast  06/11/2014   CLINICAL DATA:  Initial evaluation for a generalized pain, weakness, weight loss  of 15 lb, past medical history of diabetes and hypertension as well as gout and heart failure  EXAM: CT CHEST, ABDOMEN AND PELVIS WITHOUT CONTRAST  TECHNIQUE: Multidetector CT imaging of the chest, abdomen and pelvis was performed following the standard protocol without IV contrast.  COMPARISON:  08/13/2013  FINDINGS: CT CHEST FINDINGS  Mild bilateral atelectasis. No significant parenchymal opacities otherwise.  Thyroid normal. Thoracic inlet normal. Calcification of the thoracic aorta. Moderate to severe coronary artery calcification. No significant pleural or pericardial effusion. No significant hilar or mediastinal adenopathy. 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  Stable latissimus dorsi 5 cm lipoma. No acute thoracic musculoskeletal findings.  CT ABDOMEN AND PELVIS FINDINGS  Liver is normal. Gallbladder is surgically absent. Spleen is normal. There is a stable 2 cm splenule in the hilum. Pancreas is normal.  Adrenal glands are normal. Significant perinephric inflammatory change bilaterally similar to prior study likely chronic. Bilateral renal cortical atrophy stable. 1 mm stone lower pole left kidney. Low attenuation 1.5 lesion lower pole left kidney not characterized without contrast but stable. 15 mm upper pole low-attenuation right renal lesions stable. 1 mm stone lower pole right kidney.  Increased attenuation in the bilateral flank subcutaneous soft tissues extending inferiorly into the pelvis. Foley catheter decompresses the bladder. Nonobstructive bowel gas pattern. Significant diverticulosis involving the descending and sigmoid colon without evidence of diverticulitis. Fecal retention throughout the colon noted. Appendix is normal. Small bowel is normal. Stomach is normal.  No acute musculoskeletal findings in the abdomen or pelvis. Extensive calcification of the abdominal aorta without dilatation. Bilateral fat containing  inguinal hernias. Trace free fluid layering dependently in the pelvis.  IMPRESSION: 1. No acute findings in the thorax 2. Bilateral subcutaneous flank edema possibly related to anasarca. Trace free fluid layering dependently in the pelvis appear 3. Stable indeterminate renal lesions, likely cysts although not characterized without contrast fully. 4. Diverticulosis.  Fecal retention.   Electronically Signed   By: Skipper Cliche M.D.   On: 06/11/2014 15:54    CBC  Recent Labs Lab 06/07/14 0653 06/08/14 0205 06/09/14 0309 06/11/14 0400  WBC 5.9 4.4 7.6 9.1  HGB 10.9* 9.6* 10.8* 11.8*  HCT 31.1* 27.7* 30.7* 34.0*  PLT PLATELET CLUMPS NOTED ON SMEAR, COUNT APPEARS ADEQUATE 259 313 330  MCV 83.8 82.4  83.4 84.2  MCH 29.4 28.6 29.3 29.2  MCHC 35.0 34.7 35.2 34.7  RDW 12.7 12.4 12.5 12.6  LYMPHSABS 0.3*  --   --   --   MONOABS 0.6  --   --   --   EOSABS 0.0  --   --   --   BASOSABS 0.0  --   --   --     Chemistries   Recent Labs Lab 06/07/14 0653  06/08/14 0205 06/09/14 0309 06/10/14 0444 06/11/14 0400 06/11/14 2004 06/12/14 1610  NA 127*  < > 128* 132* 134* 134*  --  134*  K 5.5*  < > 5.1 4.6 4.0 4.7  --  4.8  CL 92*  < > 91* 96 97 101  --  101  CO2 23  < > _0 --  28  GLUCOSE 191*  < > 345* 212* 161* 145*  --  176*  BUN 126*  < > 140* 148* 145* 138*  --  111*  CREATININE 5.59*  < > 5.27* 4.36* 4.02* 3.36* 2.98* 2.71*  CALCIUM 7.0*  < > 6.9* 7.1* 7.4* 7.5*  --  7.9*  MG 2.7*  --   --   --   --   --   --   --   AST 33  --   --   --  21  --   --   --   ALT 36  --   --   --  32  --   --   --   ALKPHOS 79  --   --   --  72  --   --   --   BILITOT 0.7  --   --   --  0.6  --   --   --   < > = values in this interval not displayed. ------------------------------------------------------------------------------------------------------------------ estimated creatinine clearance is 28.4 mL/min (by C-G formula based on Cr of  2.71). ------------------------------------------------------------------------------------------------------------------ No results for input(s): HGBA1C in the last 72 hours. ------------------------------------------------------------------------------------------------------------------ No results for input(s): CHOL, HDL, LDLCALC, TRIG, CHOLHDL, LDLDIRECT in the last 72 hours. ------------------------------------------------------------------------------------------------------------------ No results for input(s): TSH, T4TOTAL, T3FREE, THYROIDAB in the last 72 hours.  Invalid input(s): FREET3 ------------------------------------------------------------------------------------------------------------------ No results for input(s): VITAMINB12, FOLATE, FERRITIN, TIBC, IRON, RETICCTPCT in the last 72 hours.  Coagulation profile No results for input(s): INR, PROTIME in the last 168 hours.  No results for input(s): DDIMER in the last 72 hours.  Cardiac Enzymes No results for input(s): CKMB, TROPONINI, MYOGLOBIN in the last 168 hours.  Invalid input(s): CK ------------------------------------------------------------------------------------------------------------------ Invalid input(s): POCBNP     Time Spent in minutes   30 minutes  Goebel Hellums M.D on 06/13/2014 at 3:04 PM  Between 7am to 7pm - Pager - [phone]  After 7pm go to [email or web address] - password [email or web address]  And look for the night coverage person covering for me after hours  [facility] Office  [phone]   **Disclaimer: This note may have been dictated with voice recognition software. Similar sounding words can inadvertently be transcribed and this note may contain transcription errors which may not have been corrected upon publication of note.**

## 2014-06-13 NOTE — Progress Notes (Signed)
74 y.o. right handed male with history of CVA with residual left hemiparesis, diabetes mellitus peripheral neuropathy, gout, diastolic congestive heart failure, chronic renal insufficiency 2.44. Lives with his wife and used a cane prior to admission. Presented to [facility] 05/31/2014 with increasing nonspecific chest pain and shortness of breath and transferred to [facility] for further medical evaluation. Complaints of increased shortness of breath and lower extremity edema. He was found to have acute respiratory failure secondary to acute CHF exacerbation placed on BiPAP. Troponin was negative. BNP 7363 Patient placed on Lasix. Echocardiogram with ejection fraction of 45% with akinesis of the inferior lateral and inferior myocardium. Progressive increase in creatinine function from baseline 2.4-5.37 Renal service is consulted. Renal ultrasound showed no hydronephrosis and latest creatinine 4.02. Complaints of joint pain low-grade fever with ESR 135 as well as uric acid level 8.3. Patient had been placed on Solu-Medrol with workup concerning for PMR versus RA. It was felt prednisone was inducing increased renal function. TEE completed 06/07/2014 showing no vegetation moderate sized PFO with left-to-right shunting at rest. Full workups remain underway with ANCA studies normal.. Palliative care has been consult to establish goals of care. MRI lumbar spine revealed some bulging disc and some spinal stenosis. Also concern over bone marrow abnormalities with neurology services to follow-up with bone scan completed for clarity that showed no evidence of osseous metastatic disease 06/10/2014  Subjective/Complaints: Ortho note appreciated Pain during TED hose donning RLE  Per RN orthostatic BPs showed no drop this am Objective: Vital Signs: Blood pressure 163/62, pulse 58, temperature 97.5 F (36.4 C), temperature source Oral, resp. rate 17, height 5' 8"  (1.727 m), weight 102.4 kg (225  lb 12 oz), SpO2 95 %. Ct Abdomen Pelvis Wo Contrast  06/11/2014   CLINICAL DATA:  Initial evaluation for a generalized pain, weakness, weight loss of 15 lb, past medical history of diabetes and hypertension as well as gout and heart failure  EXAM: CT CHEST, ABDOMEN AND PELVIS WITHOUT CONTRAST  TECHNIQUE: Multidetector CT imaging of the chest, abdomen and pelvis was performed following the standard protocol without IV contrast.  COMPARISON:  08/13/2013  FINDINGS: CT CHEST FINDINGS  Mild bilateral atelectasis. No significant parenchymal opacities otherwise.  Thyroid normal. Thoracic inlet normal. Calcification of the thoracic aorta. Moderate to severe coronary artery calcification. No significant pleural or pericardial effusion. No significant hilar or mediastinal adenopathy. 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  Stable latissimus dorsi 5 cm lipoma. No acute thoracic musculoskeletal findings.  CT ABDOMEN AND PELVIS FINDINGS  Liver is normal. Gallbladder is surgically absent. Spleen is normal. There is a stable 2 cm splenule in the hilum. Pancreas is normal.  Adrenal glands are normal. Significant perinephric inflammatory change bilaterally similar to prior study likely chronic. Bilateral renal cortical atrophy stable. 1 mm stone lower pole left kidney. Low attenuation 1.5 lesion lower pole left kidney not characterized without contrast but stable. 15 mm upper pole low-attenuation right renal lesions stable. 1 mm stone lower pole right kidney.  Increased attenuation in the bilateral flank subcutaneous soft tissues extending inferiorly into the pelvis. Foley catheter decompresses the bladder. Nonobstructive bowel gas pattern. Significant diverticulosis involving the descending and sigmoid colon without evidence of diverticulitis. Fecal retention throughout the colon noted. Appendix is normal. Small bowel is normal. Stomach is normal.  No  acute musculoskeletal findings in the abdomen or pelvis. Extensive calcification of the abdominal aorta without dilatation. Bilateral fat containing inguinal  hernias. Trace free fluid layering dependently in the pelvis.  IMPRESSION: 1. No acute findings in the thorax 2. Bilateral subcutaneous flank edema possibly related to anasarca. Trace free fluid layering dependently in the pelvis appear 3. Stable indeterminate renal lesions, likely cysts although not characterized without contrast fully. 4. Diverticulosis.  Fecal retention.   Electronically Signed   By: Skipper Cliche M.D.   On: 06/11/2014 15:54   Ct Chest Wo Contrast  06/11/2014   CLINICAL DATA:  Initial evaluation for a generalized pain, weakness, weight loss of 15 lb, past medical history of diabetes and hypertension as well as gout and heart failure  EXAM: CT CHEST, ABDOMEN AND PELVIS WITHOUT CONTRAST  TECHNIQUE: Multidetector CT imaging of the chest, abdomen and pelvis was performed following the standard protocol without IV contrast.  COMPARISON:  08/13/2013  FINDINGS: CT CHEST FINDINGS  Mild bilateral atelectasis. No significant parenchymal opacities otherwise.  Thyroid normal. Thoracic inlet normal. Calcification of the thoracic aorta. Moderate to severe coronary artery calcification. No significant pleural or pericardial effusion. No significant hilar or mediastinal adenopathy. 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  Stable latissimus dorsi 5 cm lipoma. No acute thoracic musculoskeletal findings.  CT ABDOMEN AND PELVIS FINDINGS  Liver is normal. Gallbladder is surgically absent. Spleen is normal. There is a stable 2 cm splenule in the hilum. Pancreas is normal.  Adrenal glands are normal. Significant perinephric inflammatory change bilaterally similar to prior study likely chronic. Bilateral renal cortical atrophy stable. 1 mm stone lower pole left kidney. Low attenuation  1.5 lesion lower pole left kidney not characterized without contrast but stable. 15 mm upper pole low-attenuation right renal lesions stable. 1 mm stone lower pole right kidney.  Increased attenuation in the bilateral flank subcutaneous soft tissues extending inferiorly into the pelvis. Foley catheter decompresses the bladder. Nonobstructive bowel gas pattern. Significant diverticulosis involving the descending and sigmoid colon without evidence of diverticulitis. Fecal retention throughout the colon noted. Appendix is normal. Small bowel is normal. Stomach is normal.  No acute musculoskeletal findings in the abdomen or pelvis. Extensive calcification of the abdominal aorta without dilatation. Bilateral fat containing inguinal hernias. Trace free fluid layering dependently in the pelvis.  IMPRESSION: 1. No acute findings in the thorax 2. Bilateral subcutaneous flank edema possibly related to anasarca. Trace free fluid layering dependently in the pelvis appear 3. Stable indeterminate renal lesions, likely cysts although not characterized without contrast fully. 4. Diverticulosis.  Fecal retention.   Electronically Signed   By: Skipper Cliche M.D.   On: 06/11/2014 15:54   Results for orders placed or performed during the hospital encounter of 06/11/14 (from the past 72 hour(s))  Creatinine, serum     Status: Abnormal   Collection Time: 06/11/14  8:04 PM  Result Value Ref Range   Creatinine, Ser 2.98 (H) 0.50 - 1.35 mg/dL   GFR calc non Af Amer 19 (L) >90 mL/min   GFR calc Af Amer 22 (L) >90 mL/min    Comment: (NOTE) The eGFR has been calculated using the CKD EPI equation. This calculation has not been validated in all clinical situations. eGFR's persistently <90 mL/min signify possible Chronic Kidney Disease.   Glucose, capillary     Status: Abnormal   Collection Time: 06/11/14 10:36 PM  Result Value Ref Range   Glucose-Capillary 242 (H) 70 - 99 mg/dL  Glucose, capillary     Status: Abnormal    Collection Time: 06/12/14  8:04 AM  Result Value Ref Range   Glucose-Capillary 170 (H) 70 - 99 mg/dL  Urinalysis, Routine w reflex microscopic     Status: Abnormal   Collection Time: 06/12/14 11:23 AM  Result Value Ref Range   Color, Urine YELLOW YELLOW   APPearance CLOUDY (A) CLEAR   Specific Gravity, Urine 1.016 1.005 - 1.030   pH 5.5 5.0 - 8.0   Glucose, UA 100 (A) NEGATIVE mg/dL   Hgb urine dipstick LARGE (A) NEGATIVE   Bilirubin Urine NEGATIVE NEGATIVE   Ketones, ur NEGATIVE NEGATIVE mg/dL   Protein, ur >300 (A) NEGATIVE mg/dL   Urobilinogen, UA 0.2 0.0 - 1.0 mg/dL   Nitrite POSITIVE (A) NEGATIVE   Leukocytes, UA MODERATE (A) NEGATIVE  Urine microscopic-add on     Status: Abnormal   Collection Time: 06/12/14 11:23 AM  Result Value Ref Range   Squamous Epithelial / LPF RARE RARE   WBC, UA 11-20 <3 WBC/hpf   RBC / HPF 21-50 <3 RBC/hpf   Bacteria, UA MANY (A) RARE   Casts GRANULAR CAST (A) NEGATIVE  Glucose, capillary     Status: Abnormal   Collection Time: 06/12/14 11:31 AM  Result Value Ref Range   Glucose-Capillary 133 (H) 70 - 99 mg/dL  Renal function panel     Status: Abnormal   Collection Time: 06/12/14  4:10 PM  Result Value Ref Range   Sodium 134 (L) 135 - 145 mmol/L   Potassium 4.8 3.5 - 5.1 mmol/L   Chloride 101 96 - 112 mmol/L   CO2 28 19 - 32 mmol/L   Glucose, Bld 176 (H) 70 - 99 mg/dL   BUN 111 (H) 6 - 23 mg/dL   Creatinine, Ser 2.71 (H) 0.50 - 1.35 mg/dL   Calcium 7.9 (L) 8.4 - 10.5 mg/dL   Phosphorus 5.2 (H) 2.3 - 4.6 mg/dL   Albumin 2.3 (L) 3.5 - 5.2 g/dL   GFR calc non Af Amer 22 (L) >90 mL/min   GFR calc Af Amer 25 (L) >90 mL/min    Comment: (NOTE) The eGFR has been calculated using the CKD EPI equation. This calculation has not been validated in all clinical situations. eGFR's persistently <90 mL/min signify possible Chronic Kidney Disease.    Anion gap 5 5 - 15  Glucose, capillary     Status: Abnormal   Collection Time: 06/12/14  4:40 PM   Result Value Ref Range   Glucose-Capillary 166 (H) 70 - 99 mg/dL  Glucose, capillary     Status: Abnormal   Collection Time: 06/12/14  9:11 PM  Result Value Ref Range   Glucose-Capillary 215 (H) 70 - 99 mg/dL  Glucose, capillary     Status: Abnormal   Collection Time: 06/13/14  6:46 AM  Result Value Ref Range   Glucose-Capillary 130 (H) 70 - 99 mg/dL   Comment 1 Notify RN      HEENT: normal Cardio: RRR and no murmur Resp: CTA B/L and unlabored GI: BS positive and NT, ND Extremity:  Edema scrotal and pretibial edema Skin:   Other erythema around scrotum Neuro: Alert/Oriented, Anxious, Normal Sensory and Abnormal Motor 4/5 bilateral delt bi tri grip, 3- bilateral HF, KE, ADF APF Musc/Skel:  Other Right knee pain with ROM, min effusion, no erythema, Left ankle pain with ROM peripheral edema no erythema Gen NAD   Assessment/Plan: 1. 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 Physiatrist  is providing close team supervision and 24 hour management of active medical problems listed below. Physiatrist and rehab team continue to assess barriers to discharge/monitor patient progress toward functional and medical goals. Given multiple medical issues , will ask IM to consult and follow along with [place] FIM: FIM - Bathing Bathing: 2: Max-Patient completes 3-4 [id number] parts or 25-49%  FIM - Upper Body Dressing/Undressing Upper body dressing/undressing steps patient completed: Thread/unthread right sleeve of pullover shirt/dresss, Thread/unthread left sleeve of pullover shirt/dress Upper body dressing/undressing: 2: Max-Patient completed 25-49% of tasks FIM - Lower Body Dressing/Undressing Lower body dressing/undressing: 1: Total-Patient completed less than 25% of tasks  FIM - Toileting Toileting: 1: Total-Patient completed zero steps, helper  did all 3  FIM - TRadio producerDevices: Bedside commode, Sliding board Toilet Transfers: 0-Activity did not occur  FIM - BControl and instrumentation engineerDevices: Sliding board, Arm rests Bed/Chair Transfer: 2: Bed > Chair or W/C: Max A (lift and lower assist), 0: Activity did not occur  FIM - Locomotion: Wheelchair Distance: 7 Locomotion: Wheelchair: 1: Travels less than 50 ft with supervision, cueing or coaxing FIM - Locomotion: Ambulation Locomotion: Ambulation: 0: Activity did not occur (Pt unable to stand on day of eval)  Comprehension Comprehension Mode: Auditory Comprehension: 5-Understands basic 90% of the time/requires cueing < 10% of the time  Expression Expression Mode: Verbal Expression: 5-Expresses basic needs/ideas: With no assist  Social Interaction Social Interaction: 5-Interacts appropriately 90% of the time - Needs monitoring or encouragement for participation or interaction.  Problem Solving Problem Solving Mode: Not assessed Problem Solving: 4-Solves basic 75 - 89% of the time/requires cueing 10 - 24% of the time  Memory Memory: 4-Recognizes or recalls 75 - 89% of the time/requires cueing 10 - 24% of the time  Medical Problem List and Plan: 1. Functional deficits secondary to debilitation after CHF exacerbation/respiratory failure/question polyarticular gout. 2.  DVT Prophylaxis/Anticoagulation: Subcutaneous heparin. Monitor platelet counts and any signs of bleeding 3. Pain Management: Lyrica 75 mg daily, Hydrocodone as needed. Monitor with increased mobility 4. Mood/depression. Zoloft 50 mg daily. Provide emotional support 5. Neuropsych: This patient is capable of making decisions on his own behalf. 6. Skin/Wound Care: Routine skin checks 7. Fluids/Electrolytes/Nutrition: Strict I&O with follow-up chemistries 8. Chronic renal insufficiency with baseline creatinine 2.44 since increased to 5.37 and stabilizing.  It was felt elevation possibly due to prednisone therapy as well as in the setting of decompensated CHF and hypotension. Renal service to follow-up 9. 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 No significant improvement with steroids. Negative TEE. Negative anti-histone DNA. Await further studies. Continue Solu-Medrol for now and plan Taper as we await further studies 10. Acute on chronic diastolic congestive heart failure. Continue to monitor with any signs of fluid overload. Lasix has since been discontinued 11. Diabetes mellitus with peripheral neuropathy. Monitor closely while on Solu-Medrol. Hemoglobin A1c 7.5. Lantus insulin 18 units daily. Check blood sugars before meals and at bedtime 12. History of CVA with left hemiparesis. Continue Plavix 13.  Elevated PSA pt has had prostate biopsy about 18yrago, ?f/u with urology as outpt given all the medical issues currently in play LOS  14.  Probable UTI empiric abx until cult avail    (Days) 2 A FACE TO FAMeigs 06/13/2014, 9:09 AM

## 2014-06-13 NOTE — Plan of Care (Signed)
Problem: RH BLADDER ELIMINATION Goal: RH STG MANAGE BLADDER WITH EQUIPMENT WITH ASSISTANCE STG Manage Bladder With Equipment With Assistance  Outcome: Not Progressing Patient incontinent in brief

## 2014-06-13 NOTE — Progress Notes (Signed)
Patient ID: Darrell Richardson, male   DOB: May 26, 1940, 74 y.o.   MRN: [id number] S:feels better today O:BP 163/62 mmHg  Pulse 58  Temp(Src) 97.5 F (36.4 C) (Oral)  Resp 17  Ht 5\' 8"  (1.727 m)  Wt 103.874 kg (229 lb)  BMI 34.83 kg/m2  SpO2 95%  Intake/Output Summary (Last 24 hours) at 06/13/14 1414 Last data filed at 06/13/14 1200  Gross per 24 hour  Intake    340 ml  Output      0 ml  Net    340 ml   Intake/Output: I/O last 3 completed shifts: In: 390 [P.O.:390] Out: 2350 [Urine:2350]  Intake/Output this shift:  Total I/O In: 240 [P.O.:240] Out: -  Weight change:  Gen:WD WN WM in NAD CVS:no rub Resp:cta NLZ:JQBHAL PFX:TKWIOXB pedal edema   Recent Labs Lab 06/07/14 0653 06/07/14 1820 06/08/14 0205 06/09/14 0309 06/10/14 0444 06/11/14 0400 06/11/14 2004 06/12/14 1610  NA 127* 126* 128* 132* 134* 134*  --  134*  K 5.5* 5.6* 5.1 4.6 4.0 4.7  --  4.8  CL 92* 93* 91* 96 97 101  --  101  CO2 23 21 22 21 25 25   --  28  GLUCOSE 191* 336* 345* 212* 161* 145*  --  176*  BUN 126* 133* 140* 148* 145* 138*  --  111*  CREATININE 5.59* 5.37* 5.27* 4.36* 4.02* 3.36* 2.98* 2.71*  ALBUMIN 1.9*  --  1.9*  --  2.2* 2.3*  --  2.3*  CALCIUM 7.0* 6.8* 6.9* 7.1* 7.4* 7.5*  --  7.9*  PHOS  --   --  8.0*  --   --  6.8*  --  5.2*  AST 33  --   --   --  21  --   --   --   ALT 36  --   --   --  32  --   --   --    Liver Function Tests:  Recent Labs Lab 06/07/14 0653  06/10/14 0444 06/11/14 0400 06/12/14 1610  AST 33  --  21  --   --   ALT 36  --  32  --   --   ALKPHOS 79  --  72  --   --   BILITOT 0.7  --  0.6  --   --   PROT 5.9*  --  5.5*  --   --   ALBUMIN 1.9*  < > 2.2* 2.3* 2.3*  < > = values in this interval not displayed. No results for input(s): LIPASE, AMYLASE in the last 168 hours. No results for input(s): AMMONIA in the last 168 hours. CBC:  Recent Labs Lab 06/07/14 0653 06/08/14 0205 06/09/14 0309 06/11/14 0400  WBC 5.9 4.4 7.6 9.1  NEUTROABS 5.0  --   --    --   HGB 10.9* 9.6* 10.8* 11.8*  HCT 31.1* 27.7* 30.7* 34.0*  MCV 83.8 82.4 83.4 84.2  PLT PLATELET CLUMPS NOTED ON SMEAR, COUNT APPEARS ADEQUATE 259 313 330   Cardiac Enzymes: No results for input(s): CKTOTAL, CKMB, CKMBINDEX, TROPONINI in the last 168 hours. CBG:  Recent Labs Lab 06/12/14 1131 06/12/14 1640 06/12/14 2111 06/13/14 0646 06/13/14 1148  GLUCAP 133* 166* 215* 130* 154*    Iron Studies: No results for input(s): IRON, TIBC, TRANSFERRIN, FERRITIN in the last 72 hours. Studies/Results: Ct Abdomen Pelvis Wo Contrast  06/11/2014   CLINICAL DATA:  Initial evaluation for a generalized pain, weakness, weight loss of  15 lb, past medical history of diabetes and hypertension as well as gout and heart failure  EXAM: CT CHEST, ABDOMEN AND PELVIS WITHOUT CONTRAST  TECHNIQUE: Multidetector CT imaging of the chest, abdomen and pelvis was performed following the standard protocol without IV contrast.  COMPARISON:  08/13/2013  FINDINGS: CT CHEST FINDINGS  Mild bilateral atelectasis. No significant parenchymal opacities otherwise.  Thyroid normal. Thoracic inlet normal. Calcification of the thoracic aorta. Moderate to severe coronary artery calcification. No significant pleural or pericardial effusion. No significant hilar or mediastinal adenopathy. 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  Stable latissimus dorsi 5 cm lipoma. No acute thoracic musculoskeletal findings.  CT ABDOMEN AND PELVIS FINDINGS  Liver is normal. Gallbladder is surgically absent. Spleen is normal. There is a stable 2 cm splenule in the hilum. Pancreas is normal.  Adrenal glands are normal. Significant perinephric inflammatory change bilaterally similar to prior study likely chronic. Bilateral renal cortical atrophy stable. 1 mm stone lower pole left kidney. Low attenuation 1.5 lesion lower pole left kidney not characterized without contrast but  stable. 15 mm upper pole low-attenuation right renal lesions stable. 1 mm stone lower pole right kidney.  Increased attenuation in the bilateral flank subcutaneous soft tissues extending inferiorly into the pelvis. Foley catheter decompresses the bladder. Nonobstructive bowel gas pattern. Significant diverticulosis involving the descending and sigmoid colon without evidence of diverticulitis. Fecal retention throughout the colon noted. Appendix is normal. Small bowel is normal. Stomach is normal.  No acute musculoskeletal findings in the abdomen or pelvis. Extensive calcification of the abdominal aorta without dilatation. Bilateral fat containing inguinal hernias. Trace free fluid layering dependently in the pelvis.  IMPRESSION: 1. No acute findings in the thorax 2. Bilateral subcutaneous flank edema possibly related to anasarca. Trace free fluid layering dependently in the pelvis appear 3. Stable indeterminate renal lesions, likely cysts although not characterized without contrast fully. 4. Diverticulosis.  Fecal retention.   Electronically Signed   By: Skipper Cliche M.D.   On: 06/11/2014 15:54   Ct Chest Wo Contrast  06/11/2014   CLINICAL DATA:  Initial evaluation for a generalized pain, weakness, weight loss of 15 lb, past medical history of diabetes and hypertension as well as gout and heart failure  EXAM: CT CHEST, ABDOMEN AND PELVIS WITHOUT CONTRAST  TECHNIQUE: Multidetector CT imaging of the chest, abdomen and pelvis was performed following the standard protocol without IV contrast.  COMPARISON:  08/13/2013  FINDINGS: CT CHEST FINDINGS  Mild bilateral atelectasis. No significant parenchymal opacities otherwise.  Thyroid normal. Thoracic inlet normal. Calcification of the thoracic aorta. Moderate to severe coronary artery calcification. No significant pleural or pericardial effusion. No significant hilar or mediastinal adenopathy. Mild chronic dissection without dilatation involving the distal descending  thoracic aorta unchanged from prior study.  Stable latissimus dorsi 5 cm lipoma. No acute thoracic musculoskeletal findings.  CT ABDOMEN AND PELVIS FINDINGS  Liver is normal. Gallbladder is surgically absent. Spleen is normal. There is a stable 2 cm splenule in the hilum. Pancreas is normal.  Adrenal glands are normal. Significant perinephric inflammatory change bilaterally similar to prior study likely chronic. Bilateral renal cortical atrophy stable. 1 mm stone lower pole left kidney. Low attenuation 1.5 lesion lower pole left kidney not characterized without contrast but stable. 15 mm upper pole low-attenuation right renal lesions stable. 1 mm stone lower pole right kidney.  Increased attenuation in the bilateral flank subcutaneous soft tissues extending inferiorly into the pelvis. Foley catheter  decompresses the bladder. Nonobstructive bowel gas pattern. Significant diverticulosis involving the descending and sigmoid colon without evidence of diverticulitis. Fecal retention throughout the colon noted. Appendix is normal. Small bowel is normal. Stomach is normal.  No acute musculoskeletal findings in the abdomen or pelvis. Extensive calcification of the abdominal aorta without dilatation. Bilateral fat containing inguinal hernias. Trace free fluid layering dependently in the pelvis.  IMPRESSION: 1. No acute findings in the thorax 2. Bilateral subcutaneous flank edema possibly related to anasarca. Trace free fluid layering dependently in the pelvis appear 3. Stable indeterminate renal lesions, likely cysts although not characterized without contrast fully. 4. Diverticulosis.  Fecal retention.   Electronically Signed   By: Skipper Cliche M.D.   On: 06/11/2014 15:54   . allopurinol  100 mg Oral Daily  . amoxicillin-clavulanate  1 tablet Oral Q12H  . clopidogrel  75 mg Oral Daily  . heparin  5,000 Units Subcutaneous 3 times per day  . insulin aspart  0-20 Units Subcutaneous TID WC  . insulin glargine  22  Units Subcutaneous Daily  . predniSONE  40 mg Oral Q breakfast  . pregabalin  75 mg Oral Daily  . psyllium  1 packet Oral QHS  . sertraline  50 mg Oral QHS    BMET    Component Value Date/Time   NA 134* 06/12/2014 1610   K 4.8 06/12/2014 1610   CL 101 06/12/2014 1610   CO2 28 06/12/2014 1610   GLUCOSE 176* 06/12/2014 1610   BUN 111* 06/12/2014 1610   CREATININE 2.71* 06/12/2014 1610   CALCIUM 7.9* 06/12/2014 1610   GFRNONAA 22* 06/12/2014 1610   GFRAA 25* 06/12/2014 1610   CBC    Component Value Date/Time   WBC 9.1 06/11/2014 0400   RBC 4.04* 06/11/2014 0400   HGB 11.8* 06/11/2014 0400   HCT 34.0* 06/11/2014 0400   PLT 330 06/11/2014 0400   MCV 84.2 06/11/2014 0400   MCH 29.2 06/11/2014 0400   MCHC 34.7 06/11/2014 0400   RDW 12.6 06/11/2014 0400   LYMPHSABS 0.3* 06/07/2014 0653   MONOABS 0.6 06/07/2014 0653   EOSABS 0.0 06/07/2014 0653   BASOSABS 0.0 06/07/2014 0653     Assessment/Plan:  1. AKI/CKD- in setting of decompensated CHF and hypotension. Non-oliguric. BUN rising out of proportion to Scr,presumably due to prednisone therapy and diuresis. Will need to follow closely for any signs of uremia. Discussed the chronicity and severity of his CKD with he and his wife. He would be willing to proceed with HD if indicated 1. Scr actually continues to slowly improve and is trending toward his baseline.  2. He has agreed to follow up with our office after discharge. 3. Will need vascular access placed in the near future 2. Hyperkalemia- resolved 3. Hyponatremia- as above, pt with edema, and improved after IV Lasix x 1 and continues to improve with returning renal function and auto-diuresis. 4. Acute on chronic systolic CHF- improved edema and weight without more lasix at this point. Continue to follow. 5. Lower extremity weakness- PT/OT. Workup per Cards and Primary svc 1. MRI of LS spine revealed some bulging disc and some spinal stenosis. Also concern over  bone marrow abnormalities. awaiting Neuro evaluation 2. Ortho believes this is related to polyarticular gout with pain limiting his movement 6. Severe protein malnutrition 7. SHPTH- await repeat Phos levels and iPTH but will likely require phosphate binders. 8. 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 Arthralgias/myalgias- presumably  polyarticular gout 1. Focus on pain relief so he can cooperate with PT 2. Consider pain management consult 3. Started colchicine and follow Scr and LFT's closely as well as CBC  Shayli Altemose A

## 2014-06-14 ENCOUNTER — Inpatient Hospital Stay (HOSPITAL_COMMUNITY): Payer: Medicare Other | Admitting: Physical Therapy

## 2014-06-14 ENCOUNTER — Inpatient Hospital Stay (HOSPITAL_COMMUNITY): Payer: Medicare Other

## 2014-06-14 LAB — COMPREHENSIVE METABOLIC PANEL
ALBUMIN: 2.2 g/dL — AB (ref 3.5–5.2)
ALK PHOS: 67 U/L (ref 39–117)
ALT: 26 U/L (ref 0–53)
AST: 16 U/L (ref 0–37)
Anion gap: 6 (ref 5–15)
BILIRUBIN TOTAL: 0.6 mg/dL (ref 0.3–1.2)
BUN: 92 mg/dL — AB (ref 6–23)
CO2: 25 mmol/L (ref 19–32)
Calcium: 7.9 mg/dL — ABNORMAL LOW (ref 8.4–10.5)
Chloride: 104 mmol/L (ref 96–112)
Creatinine, Ser: 2.35 mg/dL — ABNORMAL HIGH (ref 0.50–1.35)
GFR calc Af Amer: 30 mL/min — ABNORMAL LOW (ref >90)
GFR calc non Af Amer: 26 mL/min — ABNORMAL LOW (ref >90)
Glucose, Bld: 112 mg/dL — ABNORMAL HIGH (ref 70–99)
Potassium: 4.6 mmol/L (ref 3.5–5.1)
SODIUM: 135 mmol/L (ref 135–145)
TOTAL PROTEIN: 5.5 g/dL — AB (ref 6.0–8.3)

## 2014-06-14 LAB — GLUCOSE, CAPILLARY
GLUCOSE-CAPILLARY: 102 mg/dL — AB (ref 70–99)
Glucose-Capillary: 106 mg/dL — ABNORMAL HIGH (ref 70–99)
Glucose-Capillary: 119 mg/dL — ABNORMAL HIGH (ref 70–99)
Glucose-Capillary: 131 mg/dL — ABNORMAL HIGH (ref 70–99)
Glucose-Capillary: 150 mg/dL — ABNORMAL HIGH (ref 70–99)

## 2014-06-14 LAB — CBC WITH DIFFERENTIAL/PLATELET
BASOS PCT: 0 % (ref 0–1)
Basophils Absolute: 0 10*3/uL (ref 0.0–0.1)
Eosinophils Absolute: 0.1 10*3/uL (ref 0.0–0.7)
Eosinophils Relative: 1 % (ref 0–5)
HCT: 33.8 % — ABNORMAL LOW (ref 39.0–52.0)
Hemoglobin: 11.4 g/dL — ABNORMAL LOW (ref 13.0–17.0)
Lymphocytes Relative: 10 % — ABNORMAL LOW (ref 12–46)
Lymphs Abs: 1 10*3/uL (ref 0.7–4.0)
MCH: 28.6 pg (ref 26.0–34.0)
MCHC: 33.7 g/dL (ref 30.0–36.0)
MCV: 84.9 fL (ref 78.0–100.0)
MONO ABS: 0.8 10*3/uL (ref 0.1–1.0)
Monocytes Relative: 8 % (ref 3–12)
Neutro Abs: 7.9 10*3/uL — ABNORMAL HIGH (ref 1.7–7.7)
Neutrophils Relative %: 81 % — ABNORMAL HIGH (ref 43–77)
PLATELETS: 278 10*3/uL (ref 150–400)
RBC: 3.98 MIL/uL — ABNORMAL LOW (ref 4.22–5.81)
RDW: 12.9 % (ref 11.5–15.5)
WBC: 9.8 10*3/uL (ref 4.0–10.5)

## 2014-06-14 MED ORDER — FUROSEMIDE 20 MG PO TABS
20.0000 mg | ORAL_TABLET | Freq: Every day | ORAL | Status: DC
  Filled 2014-06-14 (×2): qty 1

## 2014-06-14 MED ORDER — BISACODYL 10 MG RE SUPP
10.0000 mg | Freq: Every day | RECTAL | Status: DC | PRN
  Administered 2014-06-14: 10 mg via RECTAL
  Filled 2014-06-14 (×2): qty 1

## 2014-06-14 MED ORDER — FUROSEMIDE 20 MG PO TABS
20.0000 mg | ORAL_TABLET | Freq: Every day | ORAL | Status: DC
  Administered 2014-06-15 – 2014-06-16 (×2): 20 mg via ORAL
  Filled 2014-06-14 (×3): qty 1

## 2014-06-14 MED ORDER — PREDNISONE 20 MG PO TABS
20.0000 mg | ORAL_TABLET | Freq: Every day | ORAL | Status: DC
  Administered 2014-06-15 – 2014-06-16 (×2): 20 mg via ORAL
  Filled 2014-06-14 (×3): qty 1

## 2014-06-14 MED ORDER — RISAQUAD PO CAPS
2.0000 | ORAL_CAPSULE | Freq: Every day | ORAL | Status: DC
  Administered 2014-06-14 – 2014-06-23 (×10): 2 via ORAL
  Filled 2014-06-14 (×11): qty 2

## 2014-06-14 NOTE — Progress Notes (Signed)
Physical Therapy Session Note  Patient Details  Name: Darrell Richardson MRN: [id number] Date of Birth: April 14, 1940  Today's Date: 06/14/2014 PT Individual Time: 1500-1600 and 1500-1600 PT Individual Time Calculation (min): 60 min and 60 min  Short Term Goals: Week 1:  PT Short Term Goal 1 (Week 1): Pt will demonstrate rolling R and L in flat bed without rail req min A.  PT Short Term Goal 2 (Week 1): Pt will demonstrate supine to/from sit transfer req mod A each direction.  PT Short Term Goal 3 (Week 1): Pt will be a 1 person transfer from bed to/from w/c.  PT Short Term Goal 4 (Week 1): Pt will initiate standing during PT session (standing frame or with walker) PT Short Term Goal 5 (Week 1): Pt will self propel manual w/c x 50' req SBA on level surface.   Skilled Therapeutic Interventions/Progress Updates:   Session 1: Focus on sit <> stand, functional transfers, activity tolerance, and wheelchair seating/parts management. Patient received semi reclined in bed, wife present throughout session and involved in patient care. Patient transferred supine > sit with HOB flat and use of bed rail with min A and verbal/tactile cues for technique to push up with BUE. With hospital bed raised, patient attempted sit <> stand using Stedy x 2 before achieving enough hip extension on third trial to achieve perched sitting on Stedy with max assist. Patient performed stand > sit using Stedy to wheelchair with max A. Patient propelled wheelchair using BUE for a total of 50 ft with supervision, increased time and verbal cues for technique with decreased functional use of RUE noted due to previous CVA. Patient performed lateral scoot transfer w/c <> mat table with mod-max A and verbal/demonstrational cues for head hips relationship and anterior weight shift to facilitate BLE weightbearing. Therapist changed out wheelchair to 20 x 18 size for improved sitting tolerance when OOB and pt instructed in managing leg rests, arm  rests, and brakes with supervision. Vitals monitored throughout session and WFL. Patient left in wheelchair with wife in family room.   Session 2: Focus on standing tolerance, functional transfers, BLE strengthening, and activity tolerance. Patient received sitting in wheelchair, wife present for session. Patient performed wheelchair mobility x 20 ft with supervision and cues for technique. Patient performed standing frame with initial attempt limited by increased B knee pain, but patient able to tolerate standing x 2 min on second trial. Orthostatic vitals = seated BP 124/55, standing BP 109/40 with patient c/o lightheadedness. Patient performed lateral scoot transfer with mod-max A w/c <> mat table and sit > supine with supervision due to lightheadedness and increased B knee pain. With bolster under knees, patient reported improvement in pain. BLE therex for strengthening: Bridging x 6, SAQ x 10 each LE with bolster, SLR x 5 with active assist for LLE, glute sets with 3-5 sec hold x 15. Patient returned to sitting edge of mat with mod A and total cues for technique. Upon returning to room, slide board transfer w/c > bed due to patient fatigue with mod A and sit > supine with supervision. Patient left supine in bed with call bell within reach and wife present.   Therapy Documentation Precautions:  Precautions Precautions: Fall Precaution Comments: WBAT on R leg (knee pain due to gout; also in hands, toes) Restrictions Weight Bearing Restrictions: No Pain: Pain Assessment Pain Assessment: Faces Faces Pain Scale: Hurts even more Pain Type: Chronic pain Pain Location: Ankle Pain Orientation: Right Pain Descriptors / Indicators:  Grimacing;Burning Pain Onset: With Activity  See FIM for current functional status  Therapy/Group: Individual Therapy  Laretta Alstrom 06/14/2014, 4:10 PM

## 2014-06-14 NOTE — IPOC Note (Signed)
Overall Plan of Care [facility]) Patient Details Name: Darrell Richardson MRN: [id number] DOB: 1940-11-19  Admitting Diagnosis: Debility gout  Hospital Problems: Active Problems:   Hypertension   CKD (chronic kidney disease), stage IV   Diabetes type 2, uncontrolled   Primary gout   Myalgia   Polyarthralgia   Debilitated   Chronic combined systolic and diastolic heart failure   ESR raised   Elevated PSA     Functional Problem List: Nursing Bladder, Bowel, Pain  PT Balance, Edema, Endurance, Motor, Nutrition, Pain, Safety, Sensory  OT Balance, Cognition, Edema, Endurance, Motor, Pain, Safety, Skin Integrity  SLP    TR         Basic ADL's: OT Grooming, Eating, Bathing, Dressing, Toileting     Advanced  ADL's: OT       Transfers: PT Bed Mobility, Bed to Chair, Car, Manufacturing systems engineer, Metallurgist: PT Ambulation, Emergency planning/management officer     Additional Impairments: OT None, Fuctional Use of Upper Extremity  SLP        TR      Anticipated Outcomes Item Anticipated Outcome  Self Feeding independent  Swallowing      Basic self-care  min assist  Toileting  minimal assist   Bathroom Transfers minimal assist  Bowel/Bladder  Mod I  Transfers  min A  Locomotion  mod I w/c propulsion  Communication     Cognition     Pain  <4  Safety/Judgment  Supervision   Therapy Plan: PT Intensity: Minimum of 1-2 x/day ,45 to 90 minutes PT Frequency: Total of 15 hours over 7 days of combined therapies PT Duration Estimated Length of Stay: 3-4 weeks OT Intensity: Minimum of 1-2 x/day, 45 to 90 minutes OT Frequency: 5 out of 7 days OT Duration/Estimated Length of Stay: 3-4 seeks         Team Interventions: Nursing Interventions Patient/Family Education, Bladder Management, Bowel Management, Pain Management  PT interventions Ambulation/gait training, Discharge planning, Functional mobility training, Psychosocial support, Therapeutic Activities,  Balance/vestibular training, Disease management/prevention, Neuromuscular re-education, Therapeutic Exercise, Wheelchair propulsion/positioning, Cognitive remediation/compensation, DME/adaptive equipment instruction, Pain management, UE/LE Strength taining/ROM, Community reintegration, Barrister's clerk education, UE/LE Coordination activities  OT Interventions Training and development officer, Cognitive remediation/compensation, Discharge planning, DME/adaptive equipment instruction, Functional mobility training, Pain management, Patient/family education, Self Care/advanced ADL retraining, Therapeutic Activities, Therapeutic Exercise, UE/LE Strength taining/ROM, UE/LE Coordination activities, Wheelchair propulsion/positioning  SLP Interventions    TR Interventions    SW/CM Interventions      Team Discharge Planning: Destination: PT-Home (home vs. snf tbd pending progress) ,OT- [place] (SNF) , SLP-  Projected Follow-up: PT-Home health PT, 24 hour supervision/assistance, OT-  Home health OT, Skilled nursing facility, SLP-  Projected Equipment Needs: PT-To be determined, OT- Tub/shower bench, 3 in 1 bedside comode, SLP-  Equipment Details: PT-Pt already has a RW, 4WW, QC, SPC, and shower seat, OT-  Patient/family involved in discharge planning: PT- Patient,  OT-Patient, SLP-   MD ELOS: 14-19d  Medical Rehab Prognosis:  Good Assessment: 74 y.o. right handed male with history of CVA with residual left hemiparesis, diabetes mellitus peripheral neuropathy, gout, diastolic congestive heart failure, chronic renal insufficiency 2.44. Lives with his wife and used a cane prior to admission. [facility]ed to [facility]/2014 with increasing nonspecific chest pain and shortness of breath and transferred to [facility] for further medical evaluation. Complaints of increased shortness of breath and lower extremity edema. He was found to have acute respiratory failure  secondary to  acute CHF exacerbation placed on BiPAP. Troponin was negative. BNP 7363 Patient placed on Lasix. Echocardiogram with ejection fraction of 45% with akinesis of the inferior lateral and inferior myocardium. Progressive increase in creatinine function from baseline 2.4-5.37 Renal service is consulted. Renal ultrasound showed no hydronephrosis and latest creatinine 4.02. Complaints of joint pain low-grade fever with ESR 135 as well as uric acid level 8.3. Patient had been placed on Solu-Medrol  Now on prednisone taper  Now requiring 24/7 Rehab RN,MD, as well as CIR level PT, OT and SLP.  Treatment team will focus on ADLs and mobility with goals set at [facility]  See Team Conference Notes for weekly updates to the plan of care

## 2014-06-14 NOTE — Progress Notes (Signed)
Occupational Therapy Session Note  Patient Details  Name: Darrell Richardson MRN: [id number] Date of Birth: [birth year]  Today's Date: 06/14/2014 OT Individual Time: 1000-1100 OT Individual Time Calculation (min): 60 min    Short Term Goals: Week 1:  OT Short Term Goal 1 (Week 1): Pt.  will bathe UB EOB with mod assist OT Short Term Goal 2 (Week 1): Pt. will perform lateral leans with mod assist during bathing OT Short Term Goal 3 (Week 1): Pt. will dress UB with SBA for dynamic sitting balance OT Short Term Goal 4 (Week 1): Pt. will don pants with mod assist using AE PRN OT Short Term Goal 5 (Week 1): Pt. will transfer to [facility] with sliding board with mod assist  Skilled Therapeutic Interventions/Progress Updates:  ADL-retraining with focus on slide board transfers, endurance, sit<>stand, and dynamic sitting balance.   Pt received supine in bed with spouse present and attentive to therapist and pt.   Pt receptive for shower-level bathing session.   After re-ed on slide board technique, pt completed transfer from edge of bed to w/c with overall max assist using slide board d/t right ankle gout pain aggravated by weight-bearing.    Pt completed similar transfer to tub bench from w/c and required close supervision and vc to reposition during bathing due to posterior and right lateral lean.   Pt was able to reach chest, arms, face, stomach and groin but performed ineffective lateral leans to wash buttocks.   Using grab bar, pt performed a partial stand for approx 7 seconds to allow therapist to wash and rinse his buttocks.   Pt required max assist to wash lower legs and feet but was too fatigued to perform lateral transfer off tub bench after bathing, and required total assist to transfer to w/c from bench using slide board.   Pt recovered in his w/c which was repositioned by therapist to sink for dressing and grooming but he was only able to don shirt effectively before tiring again.   Pt recovered and laced  his underwear and pants and was able to rise briefly off cushion to allow therapist and spouse to pull up clothing over his hips.   Pt was left in his w/c at end of session with spouse attending to all needs and call light within reach.     Therapy Documentation Precautions:  Precautions Precautions: Fall Precaution Comments: WBAT on R leg (knee pain due to gout; also in hands, toes) Restrictions Weight Bearing Restrictions: No  Pain: Pain Assessment Pain Assessment: 0-10 Pain Score: 5  Pain Type: Acute pain Pain Location: Ankle Pain Orientation: Right;Mid Pain Frequency: Intermittent Pain Onset: With Activity Patients Stated Pain Goal: 3 Pain Intervention(s): Shower;Repositioned  See FIM for current functional status  Therapy/Group: Individual Therapy  [place] 06/14/2014, 11:50 AM

## 2014-06-14 NOTE — H&P (Signed)
Patient information reviewed and entered into [facility] System by Becky Kynslei Art, covering PPS coordinator. Information including medical coding and functional independence measure will be reviewed and updated through discharge.  Per nursing, patient was given "Data Collection Information Summary for Patients in Inpatient Rehabilitation Facilities with attached Privacy Act Statement Health Care Records" upon admission.     

## 2014-06-14 NOTE — Progress Notes (Signed)
Patient ID: Darrell Richardson, male   DOB: Mar 28, 1941, 74 y.o.   MRN: [id number]   [facility] Progress Note    Assessment/ Plan:   1. AKI/CKD- in setting of decompensated CHF and hypotension. Non-oliguric.Now with improving BUN and creatinine and clinically doing better- no uremic symptoms or indications for HD. Set up to follow with Dr.Coladonato upon DC on 3/28 at 1:30PM with labs 1 week prior to that 2. Hyponatremia- improving with diuresis and renal recovery 3. Acute on chronic systolic CHF- improved edema and weight without more lasix at this point. Continue to follow. 4. Lower extremity weakness- Now in CIR for intensive and focused PT/OT 5. Severe protein malnutrition 6. SHPTH- Phosphorus 5.2-- advised on low phosphorus diet . 7. 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 Arthralgias/myalgias- presumably polyarticular gout  Subjective:   Reports to be feeling weak and awaiting PT   Objective:   BP 143/62 mmHg  Pulse 59  Temp(Src) 97.9 F (36.6 C) (Oral)  Resp 18  Ht 5\' 8"  (1.727 m)  Wt 100.4 kg (221 lb 5.5 oz)  BMI 33.66 kg/m2  SpO2 97%  Intake/Output Summary (Last 24 hours) at 06/14/14 1117 Last data filed at 06/14/14 0800  Gross per 24 hour  Intake    480 ml  Output   1461 ml  Net   -981 ml   Weight change:   Physical Exam: WSF:[id number] sitting in wheelchair VCB:SWHQP RRR, normal s1 and s2 Resp:CTA bilaterally, no rales RFF:MBWG, obese, NT, BS normal Ext:1+ LE edema  Imaging: No results found.  Labs: BMET  Recent Labs Lab 06/07/14 1820 06/08/14 0205 06/09/14 0309 06/10/14 0444 06/11/14 0400 06/11/14 2004 06/12/14 1610 06/14/14 0622  NA 126* 128* 132* 134* 134*  --  134* 135  K 5.6* 5.1 4.6 4.0 4.7  --  4.8 4.6  CL 93* 91* 96 97 101  --  101 104  CO2 21 22 21 25 25   --  28 25  GLUCOSE 336* 345* 212* 161* 145*  --  176* 112*  BUN 133* 140* 148* 145* 138*  --  111* 92*  CREATININE  5.37* 5.27* 4.36* 4.02* 3.36* 2.98* 2.71* 2.35*  CALCIUM 6.8* 6.9* 7.1* 7.4* 7.5*  --  7.9* 7.9*  PHOS  --  8.0*  --   --  6.8*  --  5.2*  --    CBC  Recent Labs Lab 06/08/14 0205 06/09/14 0309 06/11/14 0400 06/14/14 0622  WBC 4.4 7.6 9.1 9.8  NEUTROABS  --   --   --  7.9*  HGB 9.6* 10.8* 11.8* 11.4*  HCT 27.7* 30.7* 34.0* 33.8*  MCV 82.4 83.4 84.2 84.9  PLT 259 313 330 278    Medications:    . allopurinol  100 mg Oral Daily  . amoxicillin-clavulanate  1 tablet Oral Q12H  . clopidogrel  75 mg Oral Daily  . colchicine  0.3 mg Oral Daily  . heparin  5,000 Units Subcutaneous 3 times per day  . insulin aspart  0-20 Units Subcutaneous TID WC  . insulin glargine  22 Units Subcutaneous Daily  . predniSONE  30 mg Oral Q breakfast  . pregabalin  75 mg Oral Daily  . psyllium  1 packet Oral QHS  . sertraline  50 mg Oral QHS   Elmarie Shiley, MD 06/14/2014, 11:17 AM

## 2014-06-14 NOTE — Progress Notes (Signed)
Subjective/Complaints: Ortho note appreciated Pain during TED hose donning RLE No iliac pain, Bone scan showed no uptake in Left iliac area Per RN orthostatic BPs showed no drop this am Objective: Vital Signs: Blood pressure 143/62, pulse 59, temperature 97.9 F (36.6 C), temperature source Oral, resp. rate 18, height $RemoveBe'5\' 8"'ZtEHMkIpC$  (1.727 m), weight 100.4 kg (221 lb 5.5 oz), SpO2 97 %. No results found. Results for orders placed or performed during the hospital encounter of 06/11/14 (from the past 72 hour(s))  Creatinine, serum     Status: Abnormal   Collection Time: 06/11/14  8:04 PM  Result Value Ref Range   Creatinine, Ser 2.98 (H) 0.50 - 1.35 mg/dL   GFR calc non Af Amer 19 (L) >90 mL/min   GFR calc Af Amer 22 (L) >90 mL/min    Comment: (NOTE) The eGFR has been calculated using the CKD EPI equation. This calculation has not been validated in all clinical situations. eGFR's persistently <90 mL/min signify possible Chronic Kidney Disease.   Glucose, capillary     Status: Abnormal   Collection Time: 06/11/14 10:36 PM  Result Value Ref Range   Glucose-Capillary 242 (H) 70 - 99 mg/dL  Glucose, capillary     Status: Abnormal   Collection Time: 06/12/14  8:04 AM  Result Value Ref Range   Glucose-Capillary 170 (H) 70 - 99 mg/dL  Urine culture     Status: None (Preliminary result)   Collection Time: 06/12/14 11:22 AM  Result Value Ref Range   Specimen Description URINE, CATHETERIZED    Special Requests NONE    Colony Count      >=100,000 COLONIES/ML Performed at [facility] Performed at [facility]    Report Status PENDING   Urinalysis, Routine w reflex microscopic     Status: Abnormal   Collection Time: 06/12/14 11:23 AM  Result Value Ref Range   Color, Urine YELLOW YELLOW   APPearance CLOUDY (A) CLEAR   Specific Gravity, Urine 1.016 1.005 - 1.030   pH 5.5 5.0 - 8.0   Glucose, UA 100 (A) NEGATIVE mg/dL   Hgb urine  dipstick LARGE (A) NEGATIVE   Bilirubin Urine NEGATIVE NEGATIVE   Ketones, ur NEGATIVE NEGATIVE mg/dL   Protein, ur >300 (A) NEGATIVE mg/dL   Urobilinogen, UA 0.2 0.0 - 1.0 mg/dL   Nitrite POSITIVE (A) NEGATIVE   Leukocytes, UA MODERATE (A) NEGATIVE  Urine microscopic-add on     Status: Abnormal   Collection Time: 06/12/14 11:23 AM  Result Value Ref Range   Squamous Epithelial / LPF RARE RARE   WBC, UA 11-20 <3 WBC/hpf   RBC / HPF 21-50 <3 RBC/hpf   Bacteria, UA MANY (A) RARE   Casts GRANULAR CAST (A) NEGATIVE  Glucose, capillary     Status: Abnormal   Collection Time: 06/12/14 11:31 AM  Result Value Ref Range   Glucose-Capillary 133 (H) 70 - 99 mg/dL  Renal function panel     Status: Abnormal   Collection Time: 06/12/14  4:10 PM  Result Value Ref Range   Sodium 134 (L) 135 - 145 mmol/L   Potassium 4.8 3.5 - 5.1 mmol/L   Chloride 101 96 - 112 mmol/L   CO2 28 19 - 32 mmol/L   Glucose, Bld 176 (H) 70 - 99 mg/dL   BUN 111 (H) 6 - 23 mg/dL   Creatinine, Ser 2.71 (H) 0.50 - 1.35 mg/dL   Calcium 7.9 (  L) 8.4 - 10.5 mg/dL   Phosphorus 5.2 (H) 2.3 - 4.6 mg/dL   Albumin 2.3 (L) 3.5 - 5.2 g/dL   GFR calc non Af Amer 22 (L) >90 mL/min   GFR calc Af Amer 25 (L) >90 mL/min    Comment: (NOTE) The eGFR has been calculated using the CKD EPI equation. This calculation has not been validated in all clinical situations. eGFR's persistently <90 mL/min signify possible Chronic Kidney Disease.    Anion gap 5 5 - 15  Glucose, capillary     Status: Abnormal   Collection Time: 06/12/14  4:40 PM  Result Value Ref Range   Glucose-Capillary 166 (H) 70 - 99 mg/dL  Glucose, capillary     Status: Abnormal   Collection Time: 06/12/14  9:11 PM  Result Value Ref Range   Glucose-Capillary 215 (H) 70 - 99 mg/dL  Glucose, capillary     Status: Abnormal   Collection Time: 06/13/14  6:46 AM  Result Value Ref Range   Glucose-Capillary 130 (H) 70 - 99 mg/dL   Comment 1 Notify RN   Glucose, capillary      Status: Abnormal   Collection Time: 06/13/14 11:48 AM  Result Value Ref Range   Glucose-Capillary 154 (H) 70 - 99 mg/dL  Glucose, capillary     Status: Abnormal   Collection Time: 06/13/14  4:27 PM  Result Value Ref Range   Glucose-Capillary 178 (H) 70 - 99 mg/dL  Glucose, capillary     Status: Abnormal   Collection Time: 06/13/14  8:56 PM  Result Value Ref Range   Glucose-Capillary 159 (H) 70 - 99 mg/dL  CBC WITH DIFFERENTIAL     Status: Abnormal   Collection Time: 06/14/14  6:22 AM  Result Value Ref Range   WBC 9.8 4.0 - 10.5 K/uL   RBC 3.98 (L) 4.22 - 5.81 MIL/uL   Hemoglobin 11.4 (L) 13.0 - 17.0 g/dL   HCT 33.8 (L) 39.0 - 52.0 %   MCV 84.9 78.0 - 100.0 fL   MCH 28.6 26.0 - 34.0 pg   MCHC 33.7 30.0 - 36.0 g/dL   RDW 12.9 11.5 - 15.5 %   Platelets 278 150 - 400 K/uL   Neutrophils Relative % 81 (H) 43 - 77 %   Neutro Abs 7.9 (H) 1.7 - 7.7 K/uL   Lymphocytes Relative 10 (L) 12 - 46 %   Lymphs Abs 1.0 0.7 - 4.0 K/uL   Monocytes Relative 8 3 - 12 %   Monocytes Absolute 0.8 0.1 - 1.0 K/uL   Eosinophils Relative 1 0 - 5 %   Eosinophils Absolute 0.1 0.0 - 0.7 K/uL   Basophils Relative 0 0 - 1 %   Basophils Absolute 0.0 0.0 - 0.1 K/uL  Glucose, capillary     Status: Abnormal   Collection Time: 06/14/14  6:36 AM  Result Value Ref Range   Glucose-Capillary 102 (H) 70 - 99 mg/dL   Comment 1 Notify RN      HEENT: normal Cardio: RRR and no murmur Resp: CTA B/L and unlabored GI: BS positive and NT, ND Extremity:  Edema scrotal and pretibial edema Skin:   Other erythema around scrotum Neuro: Alert/Oriented, Anxious, Normal Sensory and Abnormal Motor 4/5 bilateral delt bi tri grip, 3- bilateral HF, KE, ADF APF Musc/Skel:  Other Right knee pain with ROM, min effusion, no erythema, Left ankle pain with ROM peripheral edema no erythema Gen NAD   Assessment/Plan: 1. Functional deficits secondary to Severe deconditioning vs  steroid myopathy, polyarticular gout which require 3+ hours  per day of interdisciplinary therapy in a comprehensive inpatient rehab setting. Physiatrist is providing close team supervision and 24 hour management of active medical problems listed below. Physiatrist and rehab team continue to assess barriers to discharge/monitor patient progress toward functional and medical goals. No further w/u for iliac abnl signal seen on MRI FIM: FIM - Bathing Bathing: 2: Max-Patient completes 3-4 39f10 parts or 25-49%  FIM - Upper Body Dressing/Undressing Upper body dressing/undressing steps patient completed: Thread/unthread right sleeve of pullover shirt/dresss, Thread/unthread left sleeve of pullover shirt/dress Upper body dressing/undressing: 2: Max-Patient completed 25-49% of tasks FIM - Lower Body Dressing/Undressing Lower body dressing/undressing: 1: Total-Patient completed less than 25% of tasks  FIM - Toileting Toileting: 0: No continent bowel/bladder events this shift  FIM - TRadio producerDevices: Bedside commode, Sliding board Toilet Transfers: 0-Activity did not occur  FIM - BControl and instrumentation engineerDevices: Sliding board, Arm rests, Bed rails Bed/Chair Transfer: 1: Mechanical lift  FIM - Locomotion: Wheelchair Distance: 7 Locomotion: Wheelchair: 0: Activity did not occur FIM - Locomotion: Ambulation Locomotion: Ambulation: 0: Activity did not occur  Comprehension Comprehension Mode: Auditory Comprehension: 5-Understands basic 90% of the time/requires cueing < 10% of the time  Expression Expression Mode: Verbal Expression: 5-Expresses basic needs/ideas: With extra time/assistive device  Social Interaction Social Interaction: 5-Interacts appropriately 90% of the time - Needs monitoring or encouragement for participation or interaction.  Problem Solving Problem Solving Mode: Not assessed Problem Solving: 5-Solves basic 90% of the time/requires cueing < 10% of the  time  Memory Memory: 4-Recognizes or recalls 75 - 89% of the time/requires cueing 10 - 24% of the time  Medical Problem List and Plan: 1. Functional deficits secondary to debilitation after CHF exacerbation/respiratory failure/question polyarticular gout. 2.  DVT Prophylaxis/Anticoagulation: Subcutaneous heparin. Monitor platelet counts and any signs of bleeding 3. Pain Management: Lyrica 75 mg daily, Hydrocodone as needed. Monitor with increased mobility 4. Mood/depression. Zoloft 50 mg daily. Provide emotional support 5. Neuropsych: This patient is capable of making decisions on his own behalf. 6. Skin/Wound Care: Routine skin checks 7. Fluids/Electrolytes/Nutrition: Strict I&O with follow-up chemistries 8. Chronic renal insufficiency with baseline creatinine 2.44 since increased to 5.37 and stabilizing. It was felt elevation possibly due to prednisone therapy as well as in the setting of decompensated CHF and hypotension. Renal service to follow-up 9. 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 No significant improvement with steroids. Negative TEE. Negative anti-histone DNA. Await further studies. Continue Solu-Medrol for now and plan Taper as we await further studies 10. Acute on chronic diastolic congestive heart failure. Continue to monitor with any signs of fluid overload. Lasix has since been discontinued 11. Diabetes mellitus with peripheral neuropathy. Monitor closely while on Solu-Medrol. Hemoglobin A1c 7.5. Lantus insulin 18 units daily. Check blood sugars before meals and at bedtime 12. History of CVA with left hemiparesis. Continue Plavix 13.  Elevated PSA pt has had prostate biopsy about 139yrago, ?f/u with urology as outpt given all the medical issues currently in play LOS  14.   UTI ecoli on augmentin pending cultures    (Days) 3 A FACE TO FACE EVALUATION WAS PERFORMED  Melicia Esqueda E 06/14/2014, 8:18 AM

## 2014-06-14 NOTE — Progress Notes (Signed)
Patient Demographics  Darrell Richardson, is a 74 y.o. male, DOB - Sep 28, 1940, AOZ:[id number]  Adm joint pain improving joint pain improving, weakness and deconditioning weakness and deconditioningit date - 06/11/2014   Admitting Physician Charlett Blake, MD  Outpatient Primary MD for the patient is [name], MD  LOS - 3   No chief complaint on file.                                                             Consult follow-up note   Subjective:   Darrell Richardson today has, No headache, No chest pain, No abdominal pain - No Nausea, No new weakness tingling or numbness, No Cough - SOB. Joints pain improving.  Assessment & Plan    Active Problems:   Hypertension   CKD (chronic kidney disease), stage IV   Diabetes type 2, uncontrolled   Primary gout   Myalgia   Polyarthralgia   Debilitated   Chronic combined systolic and diastolic heart failure   ESR raised   Elevated PSA  Weakness and deconditioning: - This is most likely related to muscle disuse secondary to pain from polyarthralgia. - Management as per CIR team, main goal is to control pain prior to physical therapy sessions.  Polyarthralgia/gout flare - most likely related to gout, as per orthopedic consult. - paraneoplastic workup/autoimmune/infectious workup has been nonrevealing. - Significant improvement of pain after being started on steroids and colchicine . - Continue to taper steroids gradually, will decrease prednisone to 20 mg from tomorrow. - Continue with colchicine, monitor closely given his renal function, avoid NSAIDs. - Continue with allopurinol  Acute on chronic renal failure - Continues to improve, creatinine is 2.3, patient baseline is 2.5 as an outpatient, we'll resume on low dose Lasix 20 mg oral daily, given started to have mild lower extremity edema, patient was on Lasix 80 mg  daily as an outpatient. - Nephrology are following.  Type 2 diabetes - CBGs are acceptable ,Continue with Lantus, continue with insulin sliding scale  Cardiomyopathy /  chronic diastolic and systolic CHF - appears well compensated at present , will start on low dose Lasix 20 mg oral daily given mild edema in lower extremity.  History of CVA - Continue with Plavix  Urinary retention - Foley catheter discontinued, monitor for recurrence.  UTI - Started on Augmentin, urine culture growing Escherichia coli.  Elevated PSA - Will need to follow with urology as an outpatient, has been followed by urology Dr. [name] in [place] before 10 years where he had a biopsy, most likely due to elevated PSA level and as well.     Code Status: Full  Family Communication: Wife at bedsid           Medications  Scheduled Meds: . acidophilus  2 capsule Oral Daily  . allopurinol  100 mg Oral Daily  . amoxicillin-clavulanate  1 tablet Oral Q12H  . clopidogrel  75 mg Oral Daily  . colchicine  0.3 mg Oral Daily  . furosemide  20 mg Oral Daily  .  heparin  5,000 Units Subcutaneous 3 times per day  . insulin aspart  0-20 Units Subcutaneous TID WC  . insulin glargine  22 Units Subcutaneous Daily  . [START ON 06/15/2014] predniSONE  20 mg Oral Q breakfast  . pregabalin  75 mg Oral Daily  . psyllium  1 packet Oral QHS  . sertraline  50 mg Oral QHS   Continuous Infusions:  PRN Meds:.[DISCONTINUED] acetaminophen **OR** acetaminophen, acetaminophen, bisacodyl, HYDROcodone-acetaminophen, hydrocortisone cream, ipratropium-albuterol, methocarbamol, ondansetron **OR** ondansetron (ZOFRAN) IV, polyethylene glycol, sorbitol, traMADol-acetaminophen  DVT Prophylaxis  Heparin  Lab Results  Component Value Date   PLT 278 06/14/2014    Antibiotics    Anti-infectives    Start     Dose/Rate Route Frequency Ordered Stop   06/13/14 0815  amoxicillin-clavulanate (AUGMENTIN) 500-125 MG per tablet 500 mg     Comments:  MD aware of Rocephin allergy, would like to proceed with treatment   1 tablet Oral Every 12 hours 06/13/14 0802     06/12/14 2000  amoxicillin-clavulanate (AUGMENTIN) 875-125 MG per tablet 1 tablet  Status:  Discontinued     1 tablet Oral Every 12 hours 06/12/14 1823 06/12/14 1826          Objective:   Filed Vitals:   06/14/14 0517 06/14/14 1015 06/14/14 1100 06/14/14 1628  BP:  140/54 136/54 134/58  Pulse:  58 66 57  Temp: 97.9 F (36.6 C)   97.8 F (36.6 C)  TempSrc: Oral   Oral  Resp: 18   18  Height:      Weight: 100.4 kg (221 lb 5.5 oz)     SpO2: 97% 97%  97%    Wt Readings from Last 3 Encounters:  06/14/14 100.4 kg (221 lb 5.5 oz)  06/11/14 103.874 kg (229 lb)  05/01/13 102.059 kg (225 lb)     Intake/Output Summary (Last 24 hours) at 06/14/14 1740 Last data filed at 06/14/14 1714  Gross per 24 hour  Intake    720 ml  Output   1511 ml  Net   -791 ml     Physical Exam  Awake Alert, Oriented X 3, No new F.N deficits, Normal affect [place].AT,PERRAL Supple Neck,No JVD, No cervical lymphadenopathy appriciated.  Symmetrical Chest wall movement, Good air movement bilaterally, CTAB RRR,No Gallops,Rubs or new Murmurs, No Parasternal Heave +ve B.Sounds, Abd Soft, No tenderness, No organomegaly appriciated, No rebound - guarding or rigidity. No Cyanosis, Clubbing or edema, No new Rash or bruise  , minimal tenderness to palpation and ankle joints, left is more than right( yesterday was right more than left)  Data Review   Micro Results Recent Results (from the past 240 hour(s))  Urine culture     Status: None (Preliminary result)   Collection Time: 06/12/14 11:22 AM  Result Value Ref Range Status   Specimen Description URINE, CATHETERIZED  Final   Special Requests NONE  Final   Colony Count   Final    >=100,000 COLONIES/ML Performed at [facility]    Culture   Final    ESCHERICHIA COLI Performed at [facility]    Report Status  PENDING  Incomplete    Radiology Reports No results found.  CBC  Recent Labs Lab 06/08/14 0205 06/09/14 0309 06/11/14 0400 06/14/14 0622  WBC 4.4 7.6 9.1 9.8  HGB 9.6* 10.8* 11.8* 11.4*  HCT 27.7* 30.7* 34.0* 33.8*  PLT 259 313 330 278  MCV 82.4 83.4 84.2 84.9  MCH 28.6 29.3 29.2 28.6  MCHC  34.7 35.2 34.7 33.7  RDW 12.4 12.5 12.6 12.9  LYMPHSABS  --   --   --  1.0  MONOABS  --   --   --  0.8  EOSABS  --   --   --  0.1  BASOSABS  --   --   --  0.0    Chemistries   Recent Labs Lab 06/09/14 0309 06/10/14 0444 06/11/14 0400 06/11/14 2004 06/12/14 1610 06/14/14 0622  NA 132* 134* 134*  --  134* 135  K 4.6 4.0 4.7  --  4.8 4.6  CL 96 97 101  --  101 104  CO2 _0 --  28 25  GLUCOSE 212* 161* 145*  --  176* 112*  BUN 148* 145* 138*  --  111* 92*  CREATININE 4.36* 4.02* 3.36* 2.98* 2.71* 2.35*  CALCIUM 7.1* 7.4* 7.5*  --  7.9* 7.9*  AST  --  21  --   --   --  16  ALT  --  32  --   --   --  26  ALKPHOS  --  72  --   --   --  67  BILITOT  --  0.6  --   --   --  0.6   ------------------------------------------------------------------------------------------------------------------ estimated creatinine clearance is 32.2 mL/min (by C-G formula based on Cr of 2.35). ------------------------------------------------------------------------------------------------------------------ No results for input(s): HGBA1C in the last 72 hours. ------------------------------------------------------------------------------------------------------------------ No results for input(s): CHOL, HDL, LDLCALC, TRIG, CHOLHDL, LDLDIRECT in the last 72 hours. ------------------------------------------------------------------------------------------------------------------ No results for input(s): TSH, T4TOTAL, T3FREE, THYROIDAB in the last 72 hours.  Invalid input(s):  FREET3 ------------------------------------------------------------------------------------------------------------------ No results for input(s): VITAMINB12, FOLATE, FERRITIN, TIBC, IRON, RETICCTPCT in the last 72 hours.  Coagulation profile No results for input(s): INR, PROTIME in the last 168 hours.  No results for input(s): DDIMER in the last 72 hours.  Cardiac Enzymes No results for input(s): CKMB, TROPONINI, MYOGLOBIN in the last 168 hours.  Invalid input(s): CK ------------------------------------------------------------------------------------------------------------------ Invalid input(s): POCBNP     Time Spent in minutes   30 minutes  Dylin Ihnen M.D on 06/14/2014 at 5:40 PM  Between 7am to 7pm - Pager - [phone]  After 7pm go to [email or web address] - password [email or web address]  And look for the night coverage person covering for me after hours  [facility] Office  [phone]   **Disclaimer: This note may have been dictated with voice recognition software. Similar sounding words can inadvertently be transcribed and this note may contain transcription errors which may not have been corrected upon publication of note.**

## 2014-06-15 ENCOUNTER — Inpatient Hospital Stay (HOSPITAL_COMMUNITY): Payer: Medicare Other

## 2014-06-15 ENCOUNTER — Inpatient Hospital Stay (HOSPITAL_COMMUNITY): Payer: Medicare Other | Admitting: Physical Therapy

## 2014-06-15 DIAGNOSIS — I2510 Atherosclerotic heart disease of native coronary artery without angina pectoris: Secondary | ICD-10-CM

## 2014-06-15 LAB — URINE CULTURE: Colony Count: >=100000

## 2014-06-15 LAB — GLUCOSE, CAPILLARY
GLUCOSE-CAPILLARY: 100 mg/dL — AB (ref 70–99)
GLUCOSE-CAPILLARY: 157 mg/dL — AB (ref 70–99)
Glucose-Capillary: 127 mg/dL — ABNORMAL HIGH (ref 70–99)
Glucose-Capillary: 136 mg/dL — ABNORMAL HIGH (ref 70–99)

## 2014-06-15 MED ORDER — FOSFOMYCIN TROMETHAMINE 3 G PO PACK
3.0000 g | PACK | Freq: Once | ORAL | Status: AC
  Administered 2014-06-15: 3 g via ORAL
  Filled 2014-06-15: qty 3

## 2014-06-15 MED ORDER — CARVEDILOL 3.125 MG PO TABS
3.1250 mg | ORAL_TABLET | Freq: Two times a day (BID) | ORAL | Status: DC
  Administered 2014-06-15 – 2014-06-23 (×16): 3.125 mg via ORAL
  Filled 2014-06-15 (×18): qty 1

## 2014-06-15 MED ORDER — PRAVASTATIN SODIUM 20 MG PO TABS
20.0000 mg | ORAL_TABLET | Freq: Every day | ORAL | Status: DC
  Administered 2014-06-15 – 2014-06-23 (×9): 20 mg via ORAL
  Filled 2014-06-15 (×10): qty 1

## 2014-06-15 MED ORDER — FOSFOMYCIN TROMETHAMINE 3 G PO PACK
3.0000 g | PACK | Freq: Once | ORAL | Status: AC
  Administered 2014-06-18: 3 g via ORAL
  Filled 2014-06-15 (×2): qty 3

## 2014-06-15 MED ORDER — COLCHICINE 0.6 MG PO TABS
0.6000 mg | ORAL_TABLET | Freq: Every day | ORAL | Status: DC
  Administered 2014-06-16 – 2014-06-23 (×8): 0.6 mg via ORAL
  Filled 2014-06-15 (×9): qty 1

## 2014-06-15 NOTE — Progress Notes (Signed)
Pt called me to his room with complaints that he " didn't feel good" He can not localize the location of his discomfort. Vitals signs were WNL. Pt has one previous episode of vomiting earlier in the shift. The emesis was mostly liquid and clear. The pt's blood sugar was checked and it was 106. The patient was repositioned and the temperature was turned down and a blanket removed. RN will continue to monitor pt.

## 2014-06-15 NOTE — Progress Notes (Signed)
Social Work Assessment and Plan Social Work Assessment and Plan  Patient Details  Name: Darrell Richardson MRN: [id number] Date of Birth: 1940/07/12  Today's Date: 06/15/2014  Problem List:  Patient Active Problem List   Diagnosis Date Noted  . Chronic combined systolic and diastolic heart failure 65/99/3570  . ESR raised 06/12/2014  . Elevated PSA 06/12/2014  . Debilitated 06/11/2014  . Palliative care encounter 06/09/2014  . Myalgia   . Polyarthralgia   . Primary gout   . Secondary cardiomyopathy   . Diabetes type 2, uncontrolled   . History of CVA (cerebrovascular accident)   . CKD (chronic kidney disease), stage IV 05/31/2014  . Hypertension    Past Medical History:  Past Medical History  Diagnosis Date  . Hearing loss   . Gout   . Diabetes   . Fatigue   . Hypertension   . Stroke     X 2  . Heart failure   . Muscle pain   . Hammertoe    Past Surgical History:  Past Surgical History  Procedure Laterality Date  . Stomach surgery    . Foot surgery Right   . Tee without cardioversion N/A 06/07/2014    Procedure: TRANSESOPHAGEAL ECHOCARDIOGRAM (TEE);  Surgeon: Jolaine Artist, MD;  Location: [facility] ENDOSCOPY;  Service: Cardiovascular;  Laterality: N/A;   Social History:  reports that he has quit smoking. He does not have any smokeless tobacco history on file. He reports that he drinks alcohol. He reports that he does not use illicit drugs.  Family / Support Systems Marital Status: Married Patient Roles: Spouse, Parent Spouse/Significant Other: Peggy [phone] Children: Vance-son [phone]-cell   Other Supports: Lauren Shepanski-daughter  [phone]-cell Anticipated Caregiver: wife Ability/Limitations of Caregiver: wife is in good health and can assist with pt's care Caregiver Availability: 24/7 Family Dynamics: Close knit family both children are close and are involved with Mom and Dad.  Daughter is a NP and keeps an eye on pt and his health issues.  He states: " I  have plenty of people to keep me in check wiht my health issues."  Social History Preferred language: English Religion: None Cultural Background: No issues Education: High School Read: Yes Write: Yes Employment Status: Retired Freight forwarder Issues: No issues Guardian/Conservator: None-according to MD pt is capable of making his own decisions while here.   Abuse/Neglect Physical Abuse: Denies Verbal Abuse: Denies Sexual Abuse: Denies Exploitation of patient/patient's resources: Denies Self-Neglect: Denies  Emotional Status Pt's affect, behavior adn adjustment status: Pt is motivated to improve and would like to go home soon, he feels he can do as well at home as he can here.  His wife has helped him through both strokes and he feels she can assist him now.  He wants to be able to be mobile and not a burden to wife, so realizes the ened to be here. Recent Psychosocial Issues: Other health issues-recovered well from both of his strokes. Pyschiatric History: History of depression takes med for this and feels it helps him.  he feels depression screen is not needed due to already on depression med but may benefit from neuropsych for coping skills. Substance Abuse History: No issues  Patient / Family Perceptions, Expectations & Goals Pt/Family understanding of illness & functional limitations: Pt and wife can explain his diagnosis and his reason for being in the hospital.  He talks with MD and feels his quesitons are being answered.  His wfie he states; " She is the boss  and she makes sure the path is clear of where I am suppose to go."  He is in good hands with his family's advocacy Premorbid pt/family roles/activities: Husband, father, grandfather, church member, retiree, Social research officer, government Anticipated changes in roles/activities/participation: resume Pt/family expectations/goals: Pt states: " I want to be home soon and be walking so not to be too much for my wife."  Wife states: " I want him  to be moving and to get stronger while here."  [employer]: Other (Comment) (had in past) Premorbid Home Care/DME Agencies: Other (Comment) (had in past) Transportation available at discharge: wife drives Resource referrals recommended: Neuropsychology, Support group (specify)  Discharge Planning Living Arrangements: Spouse/significant other Support Systems: Spouse/significant other, Children, Friends/neighbors, Church/faith community Type of Residence: Private residence Insurance Resources: [employer], Multimedia programmer (specify) Nurse, mental health) Financial Resources: Radio broadcast assistant Screen Referred: No Living Expenses: Lives with family Money Management: Spouse, Patient Does the patient have any problems obtaining your medications?: No Home Management: Wife does home management Patient/Family Preliminary Plans: Return home wiht wife who can assist with his physical care.  She plans to be here and participate in therapies with pt.  Both she and their daughter have medical background and understand the processes and treatment plan pt is on.  Will await team conference tomorrow and work on a discharge plan. Social Work Anticipated Follow Up Needs: HH/OP, Support Group  Clinical Impression Pleasant gentleman who is willing to work hard in therapies and recover from this bout he is having.  His wife is supportive and involved and willing to assist him at discharge and plans to be here to participate in  Therapies with him.  He also has supportive children who are local and can check in on. Will work on safe discharge plan with wife and pt.  Elease Hashimoto 06/15/2014, 11:19 AM

## 2014-06-15 NOTE — Progress Notes (Signed)
Nutrition Brief Note  RD contacted for food choices/options via RN. Pt reports his appetite is fine however does not like his diet. Pt is agreeable to a chicken salad sandwich and fruit cups for nourishment snacks. RD to order. Pt is currently eating 80-100% of meals. Weight has been stable. Appetite and meal completions PTA was adequate. No further nutrition interventions at this time. If nutritional issues arise, please consult RD.  Kallie Locks, MS, RD, LDN Pager # [phone] After hours/ weekend pager # [phone]

## 2014-06-15 NOTE — Progress Notes (Signed)
Advanced Heart Failure Rounding Note   Subjective:    Feeling some better. Joint pain improving. Working diagnosis is polyarticular gout.   Renal function back to baseline. No volume overload.  Objective:   Weight Range:  Vital Signs:   Temp:  [97.8 F (36.6 C)-98.9 F (37.2 C)] 98.7 F (37.1 C) (03/08 0626) Pulse Rate:  [57-73] 69 (03/08 0626) Resp:  [18] 18 (03/08 0626) BP: (123-134)/(52-58) 130/58 mmHg (03/08 0626) SpO2:  [94 %-97 %] 96 % (03/08 0626) Weight:  [101.288 kg (223 lb 4.8 oz)] 101.288 kg (223 lb 4.8 oz) (03/08 0500) Last BM Date: 06/14/14  Weight change: Filed Weights   06/13/14 1100 06/14/14 0517 06/15/14 0500  Weight: 103.874 kg (229 lb) 100.4 kg (221 lb 5.5 oz) 101.288 kg (223 lb 4.8 oz)    Intake/Output:   Intake/Output Summary (Last 24 hours) at 06/15/14 1252 Last data filed at 06/15/14 1145  Gross per 24 hour  Intake    240 ml  Output   1650 ml  Net  -1410 ml     Physical Exam: General: Elderly sitting in chair.No dyspnea HEENT: normal Neck: supple. JVP 7-8Carotids 2+ bilat; no bruits. No lymphadenopathy or thryomegaly appreciated. Cor: PMI nondisplaced. Regular rate & rhythm. No rubs, gallops or murmurs. No S3.  Lungs: clear Abdomen: soft, nontender, nondistended. No hepatosplenomegaly. No bruits or masses. Good bowel sounds. Extremities no cyanosis, clubbing, rash, no edema. DPs trace bialterally Neuro: alert & orientedx3, cranial nerves grossly intact. LEs weak and sore  Telemetry:  SR   Labs: Basic Metabolic Panel:  Recent Labs Lab 06/09/14 0309 06/10/14 0444 06/11/14 0400 06/11/14 2004 06/12/14 1610 06/14/14 0622  NA 132* 134* 134*  --  134* 135  K 4.6 4.0 4.7  --  4.8 4.6  CL 96 97 101  --  101 104  CO2 21 25 25   --  28 25  GLUCOSE 212* 161* 145*  --  176* 112*  BUN 148* 145* 138*  --  111* 92*  CREATININE 4.36* 4.02* 3.36* 2.98* 2.71* 2.35*  CALCIUM 7.1* 7.4* 7.5*  --  7.9* 7.9*  PHOS  --   --  6.8*  --  5.2*  --      Liver Function Tests:  Recent Labs Lab 06/10/14 0444 06/11/14 0400 06/12/14 1610 06/14/14 0622  AST 21  --   --  16  ALT 32  --   --  26  ALKPHOS 72  --   --  67  BILITOT 0.6  --   --  0.6  PROT 5.5*  --   --  5.5*  ALBUMIN 2.2* 2.3* 2.3* 2.2*   No results for input(s): LIPASE, AMYLASE in the last 168 hours. No results for input(s): AMMONIA in the last 168 hours.  CBC:  Recent Labs Lab 06/09/14 0309 06/11/14 0400 06/14/14 0622  WBC 7.6 9.1 9.8  NEUTROABS  --   --  7.9*  HGB 10.8* 11.8* 11.4*  HCT 30.7* 34.0* 33.8*  MCV 83.4 84.2 84.9  PLT 313 330 278    Cardiac Enzymes: No results for input(s): CKTOTAL, CKMB, CKMBINDEX, TROPONINI in the last 168 hours.  BNP: BNP (last 3 results) No results for input(s): BNP in the last 8760 hours.  ProBNP (last 3 results) No results for input(s): PROBNP in the last 8760 hours.    Other results:   Imaging: No results found.   Medications:     Scheduled Medications: . acidophilus  2 capsule Oral Daily  .  allopurinol  100 mg Oral Daily  . amoxicillin-clavulanate  1 tablet Oral Q12H  . clopidogrel  75 mg Oral Daily  . colchicine  0.3 mg Oral Daily  . furosemide  20 mg Oral Daily  . heparin  5,000 Units Subcutaneous 3 times per day  . insulin aspart  0-20 Units Subcutaneous TID WC  . insulin glargine  22 Units Subcutaneous Daily  . predniSONE  20 mg Oral Q breakfast  . pregabalin  75 mg Oral Daily  . psyllium  1 packet Oral QHS  . sertraline  50 mg Oral QHS    Infusions:    PRN Medications: [DISCONTINUED] acetaminophen **OR** acetaminophen, acetaminophen, bisacodyl, HYDROcodone-acetaminophen, hydrocortisone cream, ipratropium-albuterol, methocarbamol, ondansetron **OR** ondansetron (ZOFRAN) IV, polyethylene glycol, sorbitol, traMADol-acetaminophen   Assessment:   1. A/C Systolic HF    --EF 29-79% 2. Acute Respiratory Failure- on Bipap now weaned to nasal cannula.  3. Hypertensive crisis 4. DM 5.  Acute on CKD, stage IV - Creatinine baseline 2.5 6. 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 Chest Pain 1 week ago 8. Fever - ? Acute bronchitis vs flu 9. Acute gout - uric acid 8.3 10. Hyponatremia 11. Joint/muscle aches with ESR 135    -CK, RF, complement, ASO, blood cx negative.     - ANCA, lyme titers, SPEP/UPEP, CCP, parvo B19 (IgM), bones scan, skeletal survey negative   Plan/Discussion:    Stable from HF perspective. Renal function improving and diuresing well . Resume low-dose b-blocker and statin. Can restart hydralazine as needed. No ACE/ARB due to renal failure. Can increase lasix as needed.   Working diagnosis seems to be polyarticular gout. Consider increasing allopurinol     Benay Spice 12:52 PM

## 2014-06-15 NOTE — Progress Notes (Signed)
Subjective/Complaints: Appreciate IM and Nephro notes Objective: Vital Signs: Blood pressure 130/58, pulse 69, temperature 98.7 F (37.1 C), temperature source Oral, resp. rate 18, height 5' 8"  (1.727 m), weight 101.288 kg (223 lb 4.8 oz), SpO2 96 %. No results found. Results for orders placed or performed during the hospital encounter of 06/11/14 (from the past 72 hour(s))  Urine culture     Status: None (Preliminary result)   Collection Time: 06/12/14 11:22 AM  Result Value Ref Range   Specimen Description URINE, CATHETERIZED    Special Requests NONE    Colony Count      >=100,000 COLONIES/ML Performed at [place] Performed at [facility]    Report Status PENDING   Urinalysis, Routine w reflex microscopic     Status: Abnormal   Collection Time: 06/12/14 11:23 AM  Result Value Ref Range   Color, Urine YELLOW YELLOW   APPearance CLOUDY (A) CLEAR   Specific Gravity, Urine 1.016 1.005 - 1.030   pH 5.5 5.0 - 8.0   Glucose, UA 100 (A) NEGATIVE mg/dL   Hgb urine dipstick LARGE (A) NEGATIVE   Bilirubin Urine NEGATIVE NEGATIVE   Ketones, ur NEGATIVE NEGATIVE mg/dL   Protein, ur >300 (A) NEGATIVE mg/dL   Urobilinogen, UA 0.2 0.0 - 1.0 mg/dL   Nitrite POSITIVE (A) NEGATIVE   Leukocytes, UA MODERATE (A) NEGATIVE  Urine microscopic-add on     Status: Abnormal   Collection Time: 06/12/14 11:23 AM  Result Value Ref Range   Squamous Epithelial / LPF RARE RARE   WBC, UA 11-20 <3 WBC/hpf   RBC / HPF 21-50 <3 RBC/hpf   Bacteria, UA MANY (A) RARE   Casts GRANULAR CAST (A) NEGATIVE  Glucose, capillary     Status: Abnormal   Collection Time: 06/12/14 11:31 AM  Result Value Ref Range   Glucose-Capillary 133 (H) 70 - 99 mg/dL  Renal function panel     Status: Abnormal   Collection Time: 06/12/14  4:10 PM  Result Value Ref Range   Sodium 134 (L) 135 - 145 mmol/L   Potassium 4.8 3.5 - 5.1 mmol/L   Chloride 101 96 - 112 mmol/L   CO2 28 19 - 32 mmol/L   Glucose, Bld 176 (H) 70 - 99 mg/dL   BUN 111 (H) 6 - 23 mg/dL   Creatinine, Ser 2.71 (H) 0.50 - 1.35 mg/dL   Calcium 7.9 (L) 8.4 - 10.5 mg/dL   Phosphorus 5.2 (H) 2.3 - 4.6 mg/dL   Albumin 2.3 (L) 3.5 - 5.2 g/dL   GFR calc non Af Amer 22 (L) >90 mL/min   GFR calc Af Amer 25 (L) >90 mL/min    Comment: (NOTE) The eGFR has been calculated using the CKD EPI equation. This calculation has not been validated in all clinical situations. eGFR's persistently <90 mL/min signify possible Chronic Kidney Disease.    Anion gap 5 5 - 15  Glucose, capillary     Status: Abnormal   Collection Time: 06/12/14  4:40 PM  Result Value Ref Range   Glucose-Capillary 166 (H) 70 - 99 mg/dL  Glucose, capillary     Status: Abnormal   Collection Time: 06/12/14  9:11 PM  Result Value Ref Range   Glucose-Capillary 215 (H) 70 - 99 mg/dL  Glucose, capillary     Status: Abnormal   Collection Time: 06/13/14  6:46 AM  Result Value Ref Range   Glucose-Capillary 130 (  H) 70 - 99 mg/dL   Comment 1 Notify RN   Glucose, capillary     Status: Abnormal   Collection Time: 06/13/14 11:48 AM  Result Value Ref Range   Glucose-Capillary 154 (H) 70 - 99 mg/dL  Glucose, capillary     Status: Abnormal   Collection Time: 06/13/14  4:27 PM  Result Value Ref Range   Glucose-Capillary 178 (H) 70 - 99 mg/dL  Glucose, capillary     Status: Abnormal   Collection Time: 06/13/14  8:56 PM  Result Value Ref Range   Glucose-Capillary 159 (H) 70 - 99 mg/dL  CBC WITH DIFFERENTIAL     Status: Abnormal   Collection Time: 06/14/14  6:22 AM  Result Value Ref Range   WBC 9.8 4.0 - 10.5 K/uL   RBC 3.98 (L) 4.22 - 5.81 MIL/uL   Hemoglobin 11.4 (L) 13.0 - 17.0 g/dL   HCT 33.8 (L) 39.0 - 52.0 %   MCV 84.9 78.0 - 100.0 fL   MCH 28.6 26.0 - 34.0 pg   MCHC 33.7 30.0 - 36.0 g/dL   RDW 12.9 11.5 - 15.5 %   Platelets 278 150 - 400 K/uL   Neutrophils Relative % 81 (H) 43 - 77 %   Neutro Abs 7.9 (H) 1.7 - 7.7 K/uL    Lymphocytes Relative 10 (L) 12 - 46 %   Lymphs Abs 1.0 0.7 - 4.0 K/uL   Monocytes Relative 8 3 - 12 %   Monocytes Absolute 0.8 0.1 - 1.0 K/uL   Eosinophils Relative 1 0 - 5 %   Eosinophils Absolute 0.1 0.0 - 0.7 K/uL   Basophils Relative 0 0 - 1 %   Basophils Absolute 0.0 0.0 - 0.1 K/uL  Comprehensive metabolic panel     Status: Abnormal   Collection Time: 06/14/14  6:22 AM  Result Value Ref Range   Sodium 135 135 - 145 mmol/L   Potassium 4.6 3.5 - 5.1 mmol/L   Chloride 104 96 - 112 mmol/L   CO2 25 19 - 32 mmol/L   Glucose, Bld 112 (H) 70 - 99 mg/dL   BUN 92 (H) 6 - 23 mg/dL   Creatinine, Ser 2.35 (H) 0.50 - 1.35 mg/dL   Calcium 7.9 (L) 8.4 - 10.5 mg/dL   Total Protein 5.5 (L) 6.0 - 8.3 g/dL   Albumin 2.2 (L) 3.5 - 5.2 g/dL   AST 16 0 - 37 U/L   ALT 26 0 - 53 U/L   Alkaline Phosphatase 67 39 - 117 U/L   Total Bilirubin 0.6 0.3 - 1.2 mg/dL   GFR calc non Af Amer 26 (L) >90 mL/min   GFR calc Af Amer 30 (L) >90 mL/min    Comment: (NOTE) The eGFR has been calculated using the CKD EPI equation. This calculation has not been validated in all clinical situations. eGFR's persistently <90 mL/min signify possible Chronic Kidney Disease.    Anion gap 6 5 - 15  Glucose, capillary     Status: Abnormal   Collection Time: 06/14/14  6:36 AM  Result Value Ref Range   Glucose-Capillary 102 (H) 70 - 99 mg/dL   Comment 1 Notify RN   Glucose, capillary     Status: Abnormal   Collection Time: 06/14/14 11:42 AM  Result Value Ref Range   Glucose-Capillary 131 (H) 70 - 99 mg/dL  Glucose, capillary     Status: Abnormal   Collection Time: 06/14/14  4:26 PM  Result Value Ref Range   Glucose-Capillary  150 (H) 70 - 99 mg/dL  Glucose, capillary     Status: Abnormal   Collection Time: 06/14/14  9:01 PM  Result Value Ref Range   Glucose-Capillary 119 (H) 70 - 99 mg/dL   Comment 1 Notify RN   Glucose, capillary     Status: Abnormal   Collection Time: 06/14/14 11:53 PM  Result Value Ref Range    Glucose-Capillary 106 (H) 70 - 99 mg/dL  Glucose, capillary     Status: Abnormal   Collection Time: 06/15/14  7:19 AM  Result Value Ref Range   Glucose-Capillary 100 (H) 70 - 99 mg/dL     HEENT: normal Cardio: RRR and no murmur Resp: CTA B/L and unlabored GI: BS positive and NT, ND Extremity:  Edema scrotal and pretibial edema Skin:   Other erythema around scrotum Neuro: Alert/Oriented, Anxious, Normal Sensory and Abnormal Motor 4/5 bilateral delt bi tri grip, 3- bilateral HF, KE, ADF APF Musc/Skel:  Other no lower ext pain with ROM, edema at ankles, no erythema, mild erythema Gen NAD   Assessment/Plan: 1. 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 Physiatrist is providing close team supervision and 24 hour management of active medical problems listed below. Physiatrist and rehab team continue to assess barriers to discharge/monitor patient progress toward functional and medical goals. Team conf in am FIM: FIM - Bathing Bathing Steps Patient Completed: Chest, Right Arm, Left Arm, Abdomen, Front perineal area Bathing: 3: Mod-Patient completes 5-7 28f10 parts or 50-74%  FIM - Upper Body Dressing/Undressing Upper body dressing/undressing steps patient completed: Thread/unthread right sleeve of pullover shirt/dresss, Thread/unthread left sleeve of pullover shirt/dress, Put head through opening of pull over shirt/dress Upper body dressing/undressing: 4: Min-Patient completed 75 plus % of tasks FIM - Lower Body Dressing/Undressing Lower body dressing/undressing steps patient completed: Thread/unthread right underwear leg, Thread/unthread left underwear leg, Thread/unthread right pants leg, Thread/unthread left pants leg Lower body dressing/undressing: 2: Max-Patient completed 25-49% of tasks  FIM - Toileting Toileting: 0: No  continent bowel/bladder events this shift  FIM - TRadio producerDevices: Bedside commode, Sliding board Toilet Transfers: 0-Activity did not occur  FIM - BControl and instrumentation engineerDevices: Bed rails, Arm rests Bed/Chair Transfer: 4: Supine > Sit: Min A (steadying Pt. > 75%/lift 1 leg), 2: Bed > Chair or W/C: Max A (lift and lower assist)  FIM - Locomotion: Wheelchair Distance: 35 Locomotion: Wheelchair: 1: Travels less than 50 ft with supervision, cueing or coaxing FIM - Locomotion: Ambulation Locomotion: Ambulation: 0: Activity did not occur  Comprehension Comprehension Mode: Auditory Comprehension: 5-Follows basic conversation/direction: With no assist  Expression Expression Mode: Verbal Expression: 5-Expresses basic needs/ideas: With no assist  Social Interaction Social Interaction: 5-Interacts appropriately 90% of the time - Needs monitoring or encouragement for participation or interaction.  Problem Solving Problem Solving Mode: Not assessed Problem Solving: 4-Solves basic 75 - 89% of the time/requires cueing 10 - 24% of the time  Memory Memory: 5-Recognizes or recalls 90% of the time/requires cueing < 10% of the time  Medical Problem List and Plan: 1. Functional deficits secondary to debilitation after CHF exacerbation/respiratory failure/question polyarticular gout. 2.  DVT Prophylaxis/Anticoagulation: Subcutaneous heparin. Monitor platelet counts and any signs of bleeding 3. Pain Management: Lyrica 75 mg daily, Hydrocodone as needed. Monitor with increased mobility 4. Mood/depression. Zoloft 50 mg daily. Provide emotional support 5. Neuropsych: This patient is  capable of making decisions on his own behalf. 6. Skin/Wound Care: Routine skin checks 7. Fluids/Electrolytes/Nutrition: Strict I&O with follow-up chemistries 8. Chronic renal insufficiency with baseline creatinine 2.44, currently 2.35 and stabilizing. It was  felt elevation possibly due to prednisone therapy as well as in the setting of decompensated CHF and hypotension. Renal service to follow-up 9. 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 No significant improvement with steroids. Negative TEE. Negative anti-histone DNA. Await further studies. Continue Solu-Medrol for now and plan Taper 10. Acute on chronic diastolic congestive heart failure. Continue to monitor with any signs of fluid overload. Lasix has since been discontinued 11. Diabetes mellitus with peripheral neuropathy. Monitor closely while on Solu-Medrol. Hemoglobin A1c 7.5. Lantus insulin 18 units daily. Check blood sugars before meals and at bedtime 12. History of CVA with left hemiparesis. Continue Plavix 13.  Elevated PSA pt has had prostate biopsy about 73yr ago, ?f/u with urology as outpt given all the medical issues currently in play LOS  14.   UTI ecoli on augmentin pending cultures    (Days) 4 A FACE TO FACE EVALUATION WAS PERFORMED  KIRSTEINS,ANDREW E  06/15/2014, 8:17 AM

## 2014-06-15 NOTE — Care Management Note (Signed)
[facility] Individual Statement of Services  Patient Name:  Darrell Richardson  Date:  06/15/2014  Welcome to the [facility].  Our goal is to provide you with an individualized program based on your diagnosis and situation, designed to meet your specific needs.  With this comprehensive rehabilitation program, you will be expected to participate in at least 3 hours of rehabilitation therapies Monday-Friday, with modified therapy programming on the weekends.  Your rehabilitation program will include the following services:  Physical Therapy (PT), Occupational Therapy (OT), Speech Therapy (ST), 24 hour per day rehabilitation nursing, Therapeutic Recreaction (TR), Case Management (Social Worker), Rehabilitation Medicine, Nutrition Services and Pharmacy Services  Weekly team conferences will be held on Wednesday to discuss your progress.  Your Social Worker will talk with you frequently to get your input and to update you on team discussions.  Team conferences with you and your family in attendance may also be held.  Expected length of stay: 3-4 weeks Overall anticipated outcome: min level  Depending on your progress and recovery, your program may change. Your Social Worker will coordinate services and will keep you informed of any changes. Your Social Worker's name and contact numbers are listed  below.  The following services may also be recommended but are not provided by the [facility] will be made to provide these services after discharge if needed.  Arrangements include referral to agencies that provide these services.  Your insurance has been verified to be:  [employer] Your primary doctor is:  Soil scientist  Pertinent information will be shared with your doctor and your insurance company.  Social Worker:  Ovidio Kin, [place] or ([phone]  Information discussed with and copy given to patient by: Elease Hashimoto, 06/15/2014, 10:52 AM

## 2014-06-15 NOTE — Progress Notes (Signed)
Physical Therapy Session Note  Patient Details  Name: Darrell Richardson MRN: [id number] Date of Birth: 1940-05-04  Today's Date: 06/15/2014 PT Individual Time: 1300-1400 PT Individual Time Calculation (min): 60 min   Short Term Goals: Week 1:  PT Short Term Goal 1 (Week 1): Pt will demonstrate rolling R and L in flat bed without rail req min A.  PT Short Term Goal 2 (Week 1): Pt will demonstrate supine to/from sit transfer req mod A each direction.  PT Short Term Goal 3 (Week 1): Pt will be a 1 person transfer from bed to/from w/c.  PT Short Term Goal 4 (Week 1): Pt will initiate standing during PT session (standing frame or with walker) PT Short Term Goal 5 (Week 1): Pt will self propel manual w/c x 50' req SBA on level surface.   Skilled Therapeutic Interventions/Progress Updates:   Patient received sitting in wheelchair, wife present for session. Session focused on activity tolerance, functional transfers, strengthening, and initiation of gait training. Patient propelled wheelchair using BUE x 50-75 ft with supervision-min A and initial HOH assist for RUE for improved efficiency and increased time. Patient transferred w/c > mat via Stedy with min A. From 20" mat, patient performed sit <> stand with min assist, able to maintain standing x 50 sec. Patient performed sit <> stand from 18" mat using RW x 2 with verbal cues for hand placement. Patient maintained standing x 50 sec and x 60 sec with alternating unilateral UE support while playing game of horseshoes. Patient with increased bilateral ankle pain with all standing activities/transfers but agreeable to attempt ambulation. Gait training using RW x 8 ft with overall min guard/assist to advance RW and +2 with wheelchair follow for safety. Pt performed squat pivot transfer w/c <> NuStep with min-mod A and performed NuStep using BUE/BLE at level 1 x 5 min without rest. Patient propelled wheelchair using BUE x 50 ft with supervision and returned to room  with wife. Patient safety plan updated to use Stedy with nursing staff for transfers and toileting.   Therapy Documentation Precautions:  Precautions Precautions: Fall Precaution Comments: WBAT on R leg (knee pain due to gout; also in hands, toes) Restrictions Weight Bearing Restrictions: No Pain: Pain Assessment Pain Assessment: Faces Faces Pain Scale: Hurts whole lot Pain Type: Chronic pain Pain Location: Ankle Pain Orientation: Right Pain Descriptors / Indicators: Aching;Burning Pain Onset: With Activity Pain Intervention(s): Repositioned;Rest  See FIM for current functional status  Therapy/Group: Individual Therapy  Laretta Alstrom 06/15/2014, 2:16 PM

## 2014-06-15 NOTE — Progress Notes (Signed)
Occupational Therapy Session Note  Patient Details  Name: Darrell Richardson MRN: [id number] Date of Birth: May 13, 1940  Today's Date: 06/15/2014 OT Individual Time:0930-1030 11 Minutes      Short Term Goals: Week 1:  OT Short Term Goal 1 (Week 1): Pt.  will bathe UB EOB with mod assist OT Short Term Goal 2 (Week 1): Pt. will perform lateral leans with mod assist during bathing OT Short Term Goal 3 (Week 1): Pt. will dress UB with SBA for dynamic sitting balance OT Short Term Goal 4 (Week 1): Pt. will don pants with mod assist using AE PRN OT Short Term Goal 5 (Week 1): Pt. will transfer to [facility] with sliding board with mod assist  Skilled Therapeutic Interventions/Progress Updates: ADL-retraining with focus on transfers using [facility] board (extended length), adapted bathing/dressing skills, and improved activity tolerance.    Pt received supine in bed and receptive for treatment although reporting poor sleep from previous night.    Pt educated on use of Beezy board as transition toward improved slide board transfer with goal of progressing to stand-pivot prior to discharge.    Pt completed bed mobility unassisted using bed rail but required min assist to scoot to edge of bed d/t posterior and left lateral lean.   Pt completed transfer to w/c using Beezy with overall min assist after setup and propelled w/c to shower with min vc and instructional cue for positioning prior to transfer.   Pt attempted squat pivot transfer to tub bench but was unable to lift off seat without assist due to right ankle pain during weight-bearing.   Pt transferred to bench with overall mod assist (pt=60%).   Pt bathed thoroughly while seated on bench, using lateral lean to wash buttocks, and returned to w/c again with overall mod assist X1, helper present but not required.    Pt completed dressing in w/c at sink with overall max assist to pull up pants and don socks.   Pt donned shirt after setup and groomed at sink, shaving face  and head after brushing dentures.    Pt left in w/c at end of session with call light within reach.     Therapy Documentation Precautions:  Precautions Precautions: Fall Precaution Comments: WBAT on R leg (knee pain due to gout; also in hands, toes) Restrictions Weight Bearing Restrictions: No  Vital Signs: Therapy Vitals Temp: 98.7 F (37.1 C) Temp Source: Oral Pulse Rate: 69 Resp: 18 BP: (!) 130/58 mmHg Patient Position (if appropriate): Lying Oxygen Therapy SpO2: 96 % O2 Device: Not Delivered  Pain: 5/10, right ankle, with activity    See FIM for current functional status  Therapy/Group: Individual Therapy   Second session: Time: 1130-1200 Time Calculation (min):  30 min  Pain Assessment: 5/10, right ankle, with activity  Skilled Therapeutic Interventions: Therapeutic activity with focus on improved sit<stand, general endurance, and static standing tolerance.  Pt received seated in w/c with soiled pants and shirt.   RN tech removed shirt for cleaning as therapist and +2 helper assisted pt with change of pants.   Pt required mod assist +2 to rise partially from w/c using bed rail for UE support as therapist and helper removed pants to thighs.   Pt then removed pants while seated and laced new pants independently up to thigh level.   Pt rose to stand fully with +2 min assist as therapist and helper assisted with donning pants.   Pt was then escorted to gym and completed transfer to raised  mat with overall mod assist, x1.   Pt educated on strengthening, and performed sit<>stand at bariatric RW unassisted from mat raised to 21", then lowered consecutively to 20", 19", and 18", maintaining static standing at RW during 4th stand for 5 seconds. Pt expressed high satisfaction with his effort and was escorted back to his room to await noon meal.   See FIM for current functional status  Therapy/Group: Individual Therapy  Darrell Richardson 06/15/2014, 6:54 AM

## 2014-06-15 NOTE — Progress Notes (Signed)
Patient Demographics  Darrell Richardson, is a 74 y.o. male, DOB - 1941/01/28, AXK:[id number]  Adm joint pain improving joint pain improving, weakness and deconditioning weakness and deconditioningit date - 06/11/2014   Admitting Physician Charlett Blake, MD  Outpatient Primary MD for the patient is [name], MD  LOS - 4   No chief complaint on file.                                                             Consult follow-up note   Subjective:   Darrell Richardson today has, No headache, No chest pain, No abdominal pain - No Nausea, No new weakness tingling or numbness, No Cough - SOB. Joints pain improving.  Assessment & Plan    Active Problems:   Hypertension   CKD (chronic kidney disease), stage IV   Diabetes type 2, uncontrolled   Primary gout   Myalgia   Polyarthralgia   Debilitated   Chronic combined systolic and diastolic heart failure   ESR raised   Elevated PSA   CAD in native artery  Weakness and deconditioning: - This is most likely related to muscle disuse secondary to pain from polyarthralgia. - Management as per CIR team, main goal is to control pain prior to physical therapy sessions.  Polyarthralgia/gout flare - most likely related to gout, as per orthopedic consult. - paraneoplastic workup/autoimmune/infectious workup has been nonrevealing. - Significant improvement of pain after being started on steroids and colchicine . - Continue to taper steroids gradually, will continue with prednisone 20 mg oral call tomorrow, then will decrease to 10 days after. - Continue with colchicine, monitor closely given his renal function, (  colchicine was increased to 0.6 mg oral daily 3/8, monitor closely avoid NSAIDs. - Continue with allopurinol  Acute on chronic renal failure - Continues to improve, creatinine is 2.3, patient baseline is 2.5 as an  outpatient, started on low dose Lasix 20 mg oral daily, given started to have mild lower extremity edema, patient was on Lasix 80 mg daily as an outpatient. - Nephrology are following.  Type 2 diabetes - CBGs are acceptable ,Continue with Lantus, continue with insulin sliding scale  Cardiomyopathy /  chronic diastolic and systolic CHF - appears well compensated at present , started on low dose Lasix 20 mg oral daily given mild edema in lower extremity.  History of CVA - Continue with Plavix  Urinary retention - Foley catheter discontinued, resolved, monitor for recurrence.  UTI - Cultures growing Escherichia coli, ESBL, stopped Augmentin, started on fosfomycin 3 g once today, will repeat in 3 days.  Elevated PSA - Will need to follow with urology as an outpatient, has been followed by urology Dr. [name] in [place] before 10 years where he had a biopsy, most likely due to elevated PSA level and as well.     Code Status: Full  Family Communication: Wife at bedsid           Medications  Scheduled Meds: . acidophilus  2 capsule Oral Daily  . allopurinol  100 mg Oral Daily  . carvedilol  3.125 mg Oral BID WC  . clopidogrel  75 mg Oral Daily  . [START ON 06/16/2014] colchicine  0.6 mg Oral Daily  . [START ON 06/18/2014] fosfomycin  3 g Oral Once  . furosemide  20 mg Oral Daily  . heparin  5,000 Units Subcutaneous 3 times per day  . insulin aspart  0-20 Units Subcutaneous TID WC  . insulin glargine  22 Units Subcutaneous Daily  . pravastatin  20 mg Oral Daily  . predniSONE  20 mg Oral Q breakfast  . pregabalin  75 mg Oral Daily  . psyllium  1 packet Oral QHS  . sertraline  50 mg Oral QHS   Continuous Infusions:  PRN Meds:.[DISCONTINUED] acetaminophen **OR** acetaminophen, acetaminophen, bisacodyl, HYDROcodone-acetaminophen, hydrocortisone cream, ipratropium-albuterol, methocarbamol, ondansetron **OR** ondansetron (ZOFRAN) IV, polyethylene glycol, sorbitol,  traMADol-acetaminophen  DVT Prophylaxis  Heparin  Lab Results  Component Value Date   PLT 278 06/14/2014    Antibiotics    Anti-infectives    Start     Dose/Rate Route Frequency Ordered Stop   06/13/14 0815  amoxicillin-clavulanate (AUGMENTIN) 500-125 MG per tablet 500 mg  Status:  Discontinued    Comments:  MD aware of Rocephin allergy, would like to proceed with treatment   1 tablet Oral Every 12 hours 06/13/14 0802 06/15/14 1333   06/12/14 2000  amoxicillin-clavulanate (AUGMENTIN) 875-125 MG per tablet 1 tablet  Status:  Discontinued     1 tablet Oral Every 12 hours 06/12/14 1823 06/12/14 1826          Objective:   Filed Vitals:   06/14/14 2354 06/15/14 0500 06/15/14 0626 06/15/14 1425  BP: 123/52  130/58 117/67  Pulse: 73  69 64  Temp: 98.6 F (37 C)  98.7 F (37.1 C) 98.6 F (37 C)  TempSrc: Oral  Oral Oral  Resp: 18  18 18   Height:      Weight:  101.288 kg (223 lb 4.8 oz)    SpO2: 94%  96% 99%    Wt Readings from Last 3 Encounters:  06/15/14 101.288 kg (223 lb 4.8 oz)  06/11/14 103.874 kg (229 lb)  05/01/13 102.059 kg (225 lb)     Intake/Output Summary (Last 24 hours) at 06/15/14 1505 Last data filed at 06/15/14 1145  Gross per 24 hour  Intake    240 ml  Output   1650 ml  Net  -1410 ml     Physical Exam  Awake Alert, Oriented X 3, No new F.N deficits, Normal affect [place].AT,PERRAL Supple Neck,No JVD, No cervical lymphadenopathy appriciated.  Symmetrical Chest wall movement, Good air movement bilaterally, CTAB RRR,No Gallops,Rubs or new Murmurs, No Parasternal Heave +ve B.Sounds, Abd Soft, No tenderness, No organomegaly appriciated, No rebound - guarding or rigidity. No Cyanosis, Clubbing or edema, No new Rash or bruise  , minimal tenderness to palpation and ankle joints. Data Review   Micro Results Recent Results (from the past 240 hour(s))  Urine culture     Status: None   Collection Time: 06/12/14 11:22 AM  Result Value Ref Range Status    Specimen Description URINE, CATHETERIZED  Final   Special Requests NONE  Final   Colony Count   Final    >=100,000 COLONIES/ML Performed at [facility]    Culture   Final    ESCHERICHIA COLI Note: Confirmed Extended Spectrum Beta-Lactamase Producer (ESBL) CRITICAL RESULT CALLED TO, READ BACK BY AND VERIFIED WITH: MARYANN 3/8 AT [place]  Performed at [facility]    Report Status 06/15/2014 FINAL  Final   Organism ID, Bacteria ESCHERICHIA COLI  Final      Susceptibility   Escherichia coli - MIC*    AMPICILLIN >=32 RESISTANT Resistant     CEFAZOLIN >=64 RESISTANT Resistant     CEFTRIAXONE >=64 RESISTANT Resistant     CIPROFLOXACIN >=4 RESISTANT Resistant     GENTAMICIN <=1 SENSITIVE Sensitive     LEVOFLOXACIN >=8 RESISTANT Resistant     NITROFURANTOIN <=16 SENSITIVE Sensitive     TOBRAMYCIN >=16 RESISTANT Resistant     TRIMETH/SULFA >=320 RESISTANT Resistant     IMIPENEM <=0.25 SENSITIVE Sensitive     PIP/TAZO 8 SENSITIVE Sensitive     * ESCHERICHIA COLI    Radiology Reports No results found.  CBC  Recent Labs Lab 06/09/14 0309 06/11/14 0400 06/14/14 0622  WBC 7.6 9.1 9.8  HGB 10.8* 11.8* 11.4*  HCT 30.7* 34.0* 33.8*  PLT 313 330 278  MCV 83.4 84.2 84.9  MCH 29.3 29.2 28.6  MCHC 35.2 34.7 33.7  RDW 12.5 12.6 12.9  LYMPHSABS  --   --  1.0  MONOABS  --   --  0.8  EOSABS  --   --  0.1  BASOSABS  --   --  0.0    Chemistries   Recent Labs Lab 06/09/14 0309 06/10/14 0444 06/11/14 0400 06/11/14 2004 06/12/14 1610 06/14/14 0622  NA 132* 134* 134*  --  134* 135  K 4.6 4.0 4.7  --  4.8 4.6  CL 96 97 101  --  101 104  CO2 21 25 25   --  28 25  GLUCOSE 212* 161* 145*  --  176* 112*  BUN 148* 145* 138*  --  111* 92*  CREATININE 4.36* 4.02* 3.36* 2.98* 2.71* 2.35*  CALCIUM 7.1* 7.4* 7.5*  --  7.9* 7.9*  AST  --  21  --   --   --  16  ALT  --  32  --   --   --  26  ALKPHOS  --  72  --   --   --  67  BILITOT  --  0.6  --   --   --  0.6    ------------------------------------------------------------------------------------------------------------------ estimated creatinine clearance is 32.3 mL/min (by C-G formula based on Cr of 2.35). ------------------------------------------------------------------------------------------------------------------ No results for input(s): HGBA1C in the last 72 hours. ------------------------------------------------------------------------------------------------------------------ No results for input(s): CHOL, HDL, LDLCALC, TRIG, CHOLHDL, LDLDIRECT in the last 72 hours. ------------------------------------------------------------------------------------------------------------------ No results for input(s): TSH, T4TOTAL, T3FREE, THYROIDAB in the last 72 hours.  Invalid input(s): FREET3 ------------------------------------------------------------------------------------------------------------------ No results for input(s): VITAMINB12, FOLATE, FERRITIN, TIBC, IRON, RETICCTPCT in the last 72 hours.  Coagulation profile No results for input(s): INR, PROTIME in the last 168 hours.  No results for input(s): DDIMER in the last 72 hours.  Cardiac Enzymes No results for input(s): CKMB, TROPONINI, MYOGLOBIN in the last 168 hours.  Invalid input(s): CK ------------------------------------------------------------------------------------------------------------------ Invalid input(s): POCBNP     Time Spent in minutes   30 minutes  Darlisha Kelm M.D on 06/15/2014 at 3:05 PM  Between 7am to 7pm - Pager - [phone]  After 7pm go to [email or web address] - password [email or web address]  And look for the night coverage person covering for me after hours  [facility] Office  [phone]   **Disclaimer: This note may have been dictated with voice recognition software. Similar sounding words can inadvertently be transcribed and this note may contain transcription  errors which may not have been  corrected upon publication of note.**

## 2014-06-16 ENCOUNTER — Inpatient Hospital Stay (HOSPITAL_COMMUNITY): Payer: Medicare Other | Admitting: Rehabilitation

## 2014-06-16 ENCOUNTER — Inpatient Hospital Stay (HOSPITAL_COMMUNITY): Payer: Medicare Other | Admitting: Occupational Therapy

## 2014-06-16 LAB — BASIC METABOLIC PANEL
ANION GAP: 6 (ref 5–15)
BUN: 74 mg/dL — ABNORMAL HIGH (ref 6–23)
CO2: 25 mmol/L (ref 19–32)
CREATININE: 2.26 mg/dL — AB (ref 0.50–1.35)
Calcium: 8 mg/dL — ABNORMAL LOW (ref 8.4–10.5)
Chloride: 103 mmol/L (ref 96–112)
GFR calc Af Amer: 31 mL/min — ABNORMAL LOW (ref >90)
GFR, EST NON AFRICAN AMERICAN: 27 mL/min — AB (ref >90)
Glucose, Bld: 103 mg/dL — ABNORMAL HIGH (ref 70–99)
Potassium: 4.8 mmol/L (ref 3.5–5.1)
Sodium: 134 mmol/L — ABNORMAL LOW (ref 135–145)

## 2014-06-16 LAB — GLUCOSE, CAPILLARY
GLUCOSE-CAPILLARY: 102 mg/dL — AB (ref 70–99)
GLUCOSE-CAPILLARY: 173 mg/dL — AB (ref 70–99)
Glucose-Capillary: 199 mg/dL — ABNORMAL HIGH (ref 70–99)
Glucose-Capillary: 236 mg/dL — ABNORMAL HIGH (ref 70–99)

## 2014-06-16 MED ORDER — FUROSEMIDE 40 MG PO TABS
40.0000 mg | ORAL_TABLET | Freq: Every day | ORAL | Status: DC
  Administered 2014-06-17: 40 mg via ORAL
  Filled 2014-06-16 (×2): qty 1

## 2014-06-16 MED ORDER — PREDNISONE 10 MG PO TABS
10.0000 mg | ORAL_TABLET | Freq: Every day | ORAL | Status: DC
  Administered 2014-06-17: 10 mg via ORAL
  Filled 2014-06-16 (×2): qty 1

## 2014-06-16 MED ORDER — FUROSEMIDE 20 MG PO TABS
20.0000 mg | ORAL_TABLET | Freq: Once | ORAL | Status: AC
Start: 2014-06-16 — End: 2014-06-16
  Administered 2014-06-16: 20 mg via ORAL
  Filled 2014-06-16: qty 1

## 2014-06-16 NOTE — Progress Notes (Signed)
Occupational Therapy Session Note  Patient Details  Name: Darrell Richardson MRN: [id number] Date of Birth: February 17, 1941  Today's Date: 06/16/2014 OT Individual Time: 0930-1030 OT Individual Time Calculation (min): 60 min    Short Term Goals: Week 1:  OT Short Term Goal 1 (Week 1): Pt.  will bathe UB EOB with mod assist OT Short Term Goal 2 (Week 1): Pt. will perform lateral leans with mod assist during bathing OT Short Term Goal 3 (Week 1): Pt. will dress UB with SBA for dynamic sitting balance OT Short Term Goal 4 (Week 1): Pt. will don pants with mod assist using AE PRN OT Short Term Goal 5 (Week 1): Pt. will transfer to [facility] with sliding board with mod assist  Skilled Therapeutic Interventions/Progress Updates:  Upon entering the room, pt seated in wheelchair awaiting therapist with wife present in the room. Skilled OT intervention with focus on ADL retraining, functional transfers, STS, dynamic standing balance, endurance, and pt/family education. Pt engaging in bathing at shower level this session. Mod A stand pivot transfer with heavy use of grab bar for w/c <> TTB. Bathing with Min A to wash B lower feet and steady assist as pt leaned to wash buttocks. Dressing in wheelchair at sink side with Min A STS and Min - Mod A for balance during clothing management. Pt fatigues easily with self care tasks and requiring rest breaks. Pt propelled wheelchair 3 bouts of 50' each with increasing fatigue as session went on. Pt showing poor insight as well by reporting to therapist he can do all of these "things (bathing, dressing, transfers, ambulation)" by himself if he wanted to. Pt becoming upset when  Therapist discussed pt needing to continue therapy until safe to discharge. OT assisted pt the remaining distance back to room. Wife present in room with call bell within reach upon exiting. BP was taken to be 125/62 with 68 bpm during session. No c/o dizziness or pain.   Therapy Documentation Precautions:   Precautions Precautions: Fall Precaution Comments: WBAT on R leg (knee pain due to gout; also in hands, toes) Restrictions Weight Bearing Restrictions: No  See FIM for current functional status  Therapy/Group: Individual Therapy  Phineas Semen 06/16/2014, 10:46 AM

## 2014-06-16 NOTE — Progress Notes (Signed)
Subjective/Complaints: Pt without issues last noc  "when will I go home?" Wife at bedside Objective: Vital Signs: Blood pressure 158/77, pulse 71, temperature 97.5 F (36.4 C), temperature source Oral, resp. rate 16, height 5' 8"  (1.727 m), weight 98.884 kg (218 lb), SpO2 96 %. No results found. Results for orders placed or performed during the hospital encounter of 06/11/14 (from the past 72 hour(s))  Glucose, capillary     Status: Abnormal   Collection Time: 06/13/14 11:48 AM  Result Value Ref Range   Glucose-Capillary 154 (H) 70 - 99 mg/dL  Glucose, capillary     Status: Abnormal   Collection Time: 06/13/14  4:27 PM  Result Value Ref Range   Glucose-Capillary 178 (H) 70 - 99 mg/dL  Glucose, capillary     Status: Abnormal   Collection Time: 06/13/14  8:56 PM  Result Value Ref Range   Glucose-Capillary 159 (H) 70 - 99 mg/dL  CBC WITH DIFFERENTIAL     Status: Abnormal   Collection Time: 06/14/14  6:22 AM  Result Value Ref Range   WBC 9.8 4.0 - 10.5 K/uL   RBC 3.98 (L) 4.22 - 5.81 MIL/uL   Hemoglobin 11.4 (L) 13.0 - 17.0 g/dL   HCT 33.8 (L) 39.0 - 52.0 %   MCV 84.9 78.0 - 100.0 fL   MCH 28.6 26.0 - 34.0 pg   MCHC 33.7 30.0 - 36.0 g/dL   RDW 12.9 11.5 - 15.5 %   Platelets 278 150 - 400 K/uL   Neutrophils Relative % 81 (H) 43 - 77 %   Neutro Abs 7.9 (H) 1.7 - 7.7 K/uL   Lymphocytes Relative 10 (L) 12 - 46 %   Lymphs Abs 1.0 0.7 - 4.0 K/uL   Monocytes Relative 8 3 - 12 %   Monocytes Absolute 0.8 0.1 - 1.0 K/uL   Eosinophils Relative 1 0 - 5 %   Eosinophils Absolute 0.1 0.0 - 0.7 K/uL   Basophils Relative 0 0 - 1 %   Basophils Absolute 0.0 0.0 - 0.1 K/uL  Comprehensive metabolic panel     Status: Abnormal   Collection Time: 06/14/14  6:22 AM  Result Value Ref Range   Sodium 135 135 - 145 mmol/L   Potassium 4.6 3.5 - 5.1 mmol/L   Chloride 104 96 - 112 mmol/L   CO2 25 19 - 32 mmol/L   Glucose, Bld 112 (H) 70 - 99 mg/dL   BUN 92 (H) 6 - 23 mg/dL   Creatinine, Ser 2.35  (H) 0.50 - 1.35 mg/dL   Calcium 7.9 (L) 8.4 - 10.5 mg/dL   Total Protein 5.5 (L) 6.0 - 8.3 g/dL   Albumin 2.2 (L) 3.5 - 5.2 g/dL   AST 16 0 - 37 U/L   ALT 26 0 - 53 U/L   Alkaline Phosphatase 67 39 - 117 U/L   Total Bilirubin 0.6 0.3 - 1.2 mg/dL   GFR calc non Af Amer 26 (L) >90 mL/min   GFR calc Af Amer 30 (L) >90 mL/min    Comment: (NOTE) The eGFR has been calculated using the CKD EPI equation. This calculation has not been validated in all clinical situations. eGFR's persistently <90 mL/min signify possible Chronic Kidney Disease.    Anion gap 6 5 - 15  Glucose, capillary     Status: Abnormal   Collection Time: 06/14/14  6:36 AM  Result Value Ref Range   Glucose-Capillary 102 (H) 70 - 99 mg/dL   Comment 1 Notify RN  Glucose, capillary     Status: Abnormal   Collection Time: 06/14/14 11:42 AM  Result Value Ref Range   Glucose-Capillary 131 (H) 70 - 99 mg/dL  Glucose, capillary     Status: Abnormal   Collection Time: 06/14/14  4:26 PM  Result Value Ref Range   Glucose-Capillary 150 (H) 70 - 99 mg/dL  Glucose, capillary     Status: Abnormal   Collection Time: 06/14/14  9:01 PM  Result Value Ref Range   Glucose-Capillary 119 (H) 70 - 99 mg/dL   Comment 1 Notify RN   Glucose, capillary     Status: Abnormal   Collection Time: 06/14/14 11:53 PM  Result Value Ref Range   Glucose-Capillary 106 (H) 70 - 99 mg/dL  Glucose, capillary     Status: Abnormal   Collection Time: 06/15/14  7:19 AM  Result Value Ref Range   Glucose-Capillary 100 (H) 70 - 99 mg/dL  Glucose, capillary     Status: Abnormal   Collection Time: 06/15/14 11:34 AM  Result Value Ref Range   Glucose-Capillary 127 (H) 70 - 99 mg/dL  Glucose, capillary     Status: Abnormal   Collection Time: 06/15/14  4:33 PM  Result Value Ref Range   Glucose-Capillary 157 (H) 70 - 99 mg/dL  Glucose, capillary     Status: Abnormal   Collection Time: 06/15/14  9:05 PM  Result Value Ref Range   Glucose-Capillary 136 (H) 70 -  99 mg/dL  Basic metabolic panel     Status: Abnormal   Collection Time: 06/16/14  5:47 AM  Result Value Ref Range   Sodium 134 (L) 135 - 145 mmol/L   Potassium 4.8 3.5 - 5.1 mmol/L   Chloride 103 96 - 112 mmol/L   CO2 25 19 - 32 mmol/L   Glucose, Bld 103 (H) 70 - 99 mg/dL   BUN 74 (H) 6 - 23 mg/dL   Creatinine, Ser 2.26 (H) 0.50 - 1.35 mg/dL   Calcium 8.0 (L) 8.4 - 10.5 mg/dL   GFR calc non Af Amer 27 (L) >90 mL/min   GFR calc Af Amer 31 (L) >90 mL/min    Comment: (NOTE) The eGFR has been calculated using the CKD EPI equation. This calculation has not been validated in all clinical situations. eGFR's persistently <90 mL/min signify possible Chronic Kidney Disease.    Anion gap 6 5 - 15  Glucose, capillary     Status: Abnormal   Collection Time: 06/16/14  6:45 AM  Result Value Ref Range   Glucose-Capillary 102 (H) 70 - 99 mg/dL     HEENT: normal Cardio: RRR and no murmur Resp: CTA B/L and unlabored GI: BS positive and NT, ND Extremity:  Edema scrotal and pretibial edema Skin:   Other erythema around scrotum Neuro: Alert/Oriented, Anxious, Normal Sensory and Abnormal Motor 4/5 bilateral delt bi tri grip, 3- bilateral HF, KE, ADF APF Musc/Skel:  Other no lower ext pain with ROM, edema at ankles,  mild erythema in feet Gen NAD   Assessment/Plan: 1. 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 Physiatrist is providing close team supervision and 24 hour management of active medical problems listed below. Physiatrist and rehab team continue to assess barriers to discharge/monitor patient progress toward functional and medical goals. Team conference today please see physician documentation under team conference tab, met with team face-to-face to discuss problems,progress, and goals. Formulized  individual treatment plan based on  medical history, underlying problem and comorbidities. FIM: FIM - Bathing Bathing Steps Patient Completed: Chest, Right Arm, Left Arm, Abdomen, Front perineal area, Buttocks, Right upper leg, Left upper leg, Right lower leg (including foot) Bathing: 4: Min-Patient completes 8-9 10f10 parts or 75+ percent  FIM - Upper Body Dressing/Undressing Upper body dressing/undressing steps patient completed: Thread/unthread right sleeve of pullover shirt/dresss, Thread/unthread left sleeve of pullover shirt/dress, Put head through opening of pull over shirt/dress, Pull shirt over trunk Upper body dressing/undressing: 5: Set-up assist to: Obtain clothing/put away FIM - Lower Body Dressing/Undressing Lower body dressing/undressing steps patient completed: Thread/unthread right pants leg, Thread/unthread left pants leg Lower body dressing/undressing: 2: Max-Patient completed 25-49% of tasks  FIM - Toileting Toileting: 0: No continent bowel/bladder events this shift  FIM - TRadio producerDevices: Bedside commode, Sliding board Toilet Transfers: 0-Activity did not occur  FIM - BControl and instrumentation engineerDevices: Sliding board, Arm rests Bed/Chair Transfer: 3: Bed > Chair or W/C: Mod A (lift or lower assist), 3: Chair or W/C > Bed: Mod A (lift or lower assist)  FIM - Locomotion: Wheelchair Distance: 50 Locomotion: Wheelchair: 2: Travels 50 - 149 ft with minimal assistance (Pt.>75%) FIM - Locomotion: Ambulation Locomotion: Ambulation Assistive Devices: WAdministratorAmbulation/Gait Assistance: 1: +2 Total assist (+2 for w/c follow) Locomotion: Ambulation: 1: Two helpers  Comprehension Comprehension Mode: Auditory Comprehension: 5-Follows basic conversation/direction: With no assist  Expression Expression Mode: Verbal Expression: 5-Expresses basic needs/ideas: With no assist  Social Interaction Social Interaction: 6-Interacts appropriately  with others with medication or extra time (anti-anxiety, antidepressant).  Problem Solving Problem Solving Mode: Not assessed Problem Solving: 4-Solves basic 75 - 89% of the time/requires cueing 10 - 24% of the time  Memory Memory: 5-Recognizes or recalls 90% of the time/requires cueing < 10% of the time  Medical Problem List and Plan: 1. Functional deficits secondary to debilitation after CHF exacerbation/respiratory failure/question polyarticular gout. 2.  DVT Prophylaxis/Anticoagulation: Subcutaneous heparin. Monitor platelet counts and any signs of bleeding 3. Pain Management: Lyrica 75 mg daily, Hydrocodone as needed. Monitor with increased mobility 4. Mood/depression. Zoloft 50 mg daily. Provide emotional support 5. Neuropsych: This patient is capable of making decisions on his own behalf. 6. Skin/Wound Care: Routine skin checks 7. Fluids/Electrolytes/Nutrition: Strict I&O with follow-up chemistries 8. Chronic renal insufficiency with baseline creatinine 2.44, currently 2.35 and stabilizing. It was felt elevation possibly due to prednisone therapy as well as in the setting of decompensated CHF and hypotension. Renal service to follow-up 9. 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 No significant improvement with steroids. Negative TEE. Negative anti-histone DNA. Await further studies. Continue Solu-Medrol for now and plan Taper 10. Acute on chronic diastolic congestive heart failure. Continue to monitor with any signs of fluid overload. Lasix has since been discontinued 11. Diabetes mellitus with peripheral neuropathy. Monitor closely while on Solu-Medrol. Hemoglobin A1c 7.5. Lantus insulin 18 units daily. Check blood sugars before meals and at bedtime 12. History of CVA with left hemiparesis. Continue Plavix 13.  Elevated PSA pt has had prostate biopsy about 156yrago, ?f/u with urology as outpt given all the medical issues currently in play LOS  14.   UTI ecoli ESBL on Fosfomycin   (Days)  5 A FACE TO FACE EVALUATION WAS PERFORMED  KIRSTEINS,ANDREW E  06/16/2014, 9:00 AM

## 2014-06-16 NOTE — Progress Notes (Signed)
Physical Therapy Session Note  Patient Details  Name: Darrell Richardson MRN: [id number] Date of Birth: 1940-11-24  Today's Date: 06/16/2014 PT Individual Time: 1300-1400 and 1430-1500 PT Individual Time Calculation (min): 60 min and 30 mins  Short Term Goals: Week 1:  PT Short Term Goal 1 (Week 1): Pt will demonstrate rolling R and L in flat bed without rail req min A.  PT Short Term Goal 2 (Week 1): Pt will demonstrate supine to/from sit transfer req mod A each direction.  PT Short Term Goal 3 (Week 1): Pt will be a 1 person transfer from bed to/from w/c.  PT Short Term Goal 4 (Week 1): Pt will initiate standing during PT session (standing frame or with walker) PT Short Term Goal 5 (Week 1): Pt will self propel manual w/c x 50' req SBA on level surface.   Skilled Therapeutic Interventions/Progress Updates:   First PM session:  Pt received lying in bed, reluctant to participate in therapy, stating "I told them I was done for today."  Provided encouragement and motivation to pt and stated that if he wanted to go home sooner, he would need to demonstrate that he can do these tasks at min A level and that wife Vickii Chafe needs to feel comfortable helping with these tasks.  Pt finally agreeable.  Performed bed mobility with HOB flat and without rails to better simulate home environment.  Performed at S level with min cues for technique.  Transferred to w/c via stand/squat pivot transfer.  Encouraged pt to stand all the way up, however preferred to remain in bent over position, despite max cues.   Pt self propelled approx 67' with BUEs to hallway outside of therapy gym at S level.  Cues for improved propulsion technique.  Performed 25' gait with RW at min A level with light facilitation for upright posture and continued pursed lip breathing.  Pt states he felt somewhat dizzy.  BP was 144/55, wife stating that this was better than it has been.  Attempted gait again, however pt able to ambulate only a few steps and  then sat abruptly.  Provided max education on safe use of hands and letting therapist know when he needs to sit.  Pt very "shut down" during session, not making eye contact with therapist and looking down the whole time.  Provided continued encouragement to continue that he was doing very well.  Performed seated LAQ's with 2lb ankle weight BLE x 10 reps with 5 sec hold.  Transitioned to sit<>stand x 5 reps without RW in order to increase strength in BLEs.  Performed at min A level with facilitation for increased forward trunk lean.  Pt transferred back to w/c via RW at min A level and propelled to ADL apt.  Performed transfer to ADL couch in order to simulate home.  Educated wife on ideas to modify furniture (his recliner rocks and turns) to make safer.  Requires mod A to stand from couch, however did well with cues for increased forward trunk lean.  Pt assisted back to room and left in w/c with all needs in reach.    Second PM session:  Pt received sitting in w/c in room, agreeable to therapy session.  Assisted to/from ortho gym for time management.  Skilled treatment focused on car transfer to simulated [place] height.  Pt performed w/c<>car with RW at min A level, however due to increased height of car seat, needed assist for scooting hips back and then provided step (as he  has running board) to provide leverage to scoot back.  Pts wife states that they do have a step stool that he has used.  Recommended bringing this in to practice with, however simulated with 3" height step with pt backing over it with RW.  He was able to perform at min A level with mod cues for safety and technique.  Pt better able to scoot hips this rep with cues for hand placement.  Transferred back to w/c and assisted back to room.  Pt agreeable to staying in w/c in room.  Feel that he does not need to have quick release belt donned, therefore D/C'd this.  Wife aware and safety plan updated.    Therapy Documentation Precautions:   Precautions Precautions: Fall Precaution Comments: WBAT on R leg (knee pain due to gout; also in hands, toes) Restrictions Weight Bearing Restrictions: No  Pain: pt with no c/o pain during session.    Locomotion : Ambulation Ambulation/Gait Assistance: 1: +2 Total assist;4: Min assist Wheelchair Mobility Distance: 80   See FIM for current functional status  Therapy/Group: Individual Therapy  Denice Bors 06/16/2014, 2:04 PM

## 2014-06-16 NOTE — Patient Care Conference (Signed)
Inpatient RehabilitationTeam Conference and Plan of Care Update Date: 06/16/2014   Time: 11;25 AM    Patient Name: Darrell Richardson      Medical Record Number: [id number]  Date of Birth: 1941-01-31 Sex: Male         Room/Bed: 4W19C/4W19C-01 Payor Info: Payor: MEDICARE / Plan: MEDICARE PART A AND B / Product Type: *No Product type* /    Admitting Diagnosis: Debility gout  Admit Date/Time:  06/11/2014  5:42 PM Admission Comments: No comment available   Primary Diagnosis:  <principal problem not specified> Principal Problem: <principal problem not specified>  Patient Active Problem List   Diagnosis Date Noted  . CAD in native artery   . Chronic combined systolic and diastolic heart failure 83/15/1761  . ESR raised 06/12/2014  . Elevated PSA 06/12/2014  . Debilitated 06/11/2014  . Palliative care encounter 06/09/2014  . Myalgia   . Polyarthralgia   . Primary gout   . Secondary cardiomyopathy   . Diabetes type 2, uncontrolled   . History of CVA (cerebrovascular accident)   . CKD (chronic kidney disease), stage IV 05/31/2014  . Hypertension     Expected Discharge Date: Expected Discharge Date: 06/23/14  Team Members Present: Physician leading conference: Dr. Alysia Penna Social Worker Present: Ovidio Kin, LCSW Nurse Present: Elliot Cousin, RN PT Present: Cameron Sprang, PT OT Present: Benay Pillow, OT SLP Present: Windell Moulding, SLP PPS Coordinator present : Daiva Nakayama, RN, CRRN     Current Status/Progress Goal Weekly Team Focus  Medical   CHF stable except increased LE edema, orthostasis improved  Home D/C  D/C planning   Bowel/Bladder   continent of bowel and bladder  remain continent of bowel and bladder  offer toileting q2-3h    Swallow/Nutrition/ Hydration     na        ADL's   Min- mod A STS and functional transfers,Mod I grooming, Mod A LB and bathing, UB dressing with set up, decreased endurance with increase rest breaks for fatigue.  overall Min A level   self care training, pt and family training, activity tolerance, and functional transfers   Mobility   S-min A bed mobility and short distance w/c mobility, min-mod A sit <> stand and squat pivot/scoot transfers, gait using RW x 8 ft with +2 for safety  min A w/c level, may need upgraded due to rapid pt progress  functional mobility, activity tolerance, standing tolerance, w/c propulsion, patient/family education   Communication     na        Safety/Cognition/ Behavioral Observations    no unsafe behaviors        Pain   pain located in right ankle a  pain less than or equal to 4 on a scale of 0-10  assess for pain q4ha nd prn, medicate as indicated   Skin   no skin issues at this time  remain free from skin injury/breakdown  assess skin q shift and prn      *See Care Plan and progress notes for long and short-term goals.  Barriers to Discharge: needs med adjustmnt and monitoring BP    Possible Resolutions to Barriers:  May need anothe r1-2 d    Discharge Planning/Teaching Needs:  Home with wife who can provide 24 hr care-plans to be here and attend therapies with pt      Team Discussion:  Medically more stable-still checking labs and on lasix. Limited self awareness-feels dong more than really is. Wants to go home ASAP.  Goals-min wheelchair-currently plus 2 for safety. Aware if progresses quicker can go home sooner. Wife here observing in therapies. Palliative care saw on acute-followed up today  Revisions to Treatment Plan:  None   Continued Need for Acute Rehabilitation Level of Care: The patient requires daily medical management by a physician with specialized training in physical medicine and rehabilitation for the following conditions: Daily direction of a multidisciplinary physical rehabilitation program to ensure safe treatment while eliciting the highest outcome that is of practical value to the patient.: Yes Daily medical management of patient stability for increased  activity during participation in an intensive rehabilitation regime.: Yes Daily analysis of laboratory values and/or radiology reports with any subsequent need for medication adjustment of medical intervention for : Other;Cardiac problems;Pulmonary problems  [name], Gardiner Rhyme 06/16/2014, 4:08 PM

## 2014-06-16 NOTE — Progress Notes (Signed)
Patient Demographics  Darrell Richardson, is a 74 y.o. male, DOB - [birth year], TGP:[id number]  Adm joint pain improving joint pain improving, weakness and deconditioning weakness and deconditioningit date - 06/11/2014   Admitting Physician Darrell Blake, MD  Outpatient Primary MD for the patient is Darrell Pop, MD  LOS - 5   No chief complaint on file.                                                             Consult follow-up note   Subjective:   Darrell Richardson today has, No headache, No chest pain, No abdominal pain - No Nausea, No new weakness tingling or numbness, No Cough - SOB. Joints pain improving.  Assessment & Plan    Active Problems:   Hypertension   CKD (chronic kidney disease), stage IV   Diabetes type 2, uncontrolled   Primary gout   Myalgia   Polyarthralgia   Debilitated   Chronic combined systolic and diastolic heart failure   ESR raised   Elevated PSA   CAD in native artery  Weakness and deconditioning: - This is most likely related to muscle disuse secondary to pain from polyarthralgia. - Management as per CIR team, main goal is to control pain prior to physical therapy sessions.  Polyarthralgia/gout flare - most likely related to gout, as per orthopedic consult. - paraneoplastic workup/autoimmune/infectious workup has been nonrevealing. - Significant improvement of pain after being started on steroids and colchicine . - Continue to taper steroids gradually, will decrease prednisone to 10 mg oral daily from tomorrow X 2 days then D/C. - Continue with colchicine, monitor closely given his renal function, (  colchicine was increased to 0.6 mg oral daily 3/8, monitor closely avoid NSAIDs. - Continue with allopurinol  Acute on chronic renal failure - Continues to improve, creatinine is 2.3, patient baseline is 2.5 as an outpatient,  started on low dose Lasix 20 mg oral daily 3/8, , will increase Lasix to 40 mg oral daily on 3/9 given worsening lower extremity edema ,patient was on Lasix 80 mg daily as an outpatient.   Type 2 diabetes - CBGs are acceptable ,Continue with Lantus, continue with insulin sliding scale  Cardiomyopathy /  chronic diastolic and systolic CHF - appears well compensated at present , on 40 mg oral Lasix.  History of CVA - Continue with Plavix  Urinary retention - Foley catheter discontinued, resolved, monitor for recurrence.  UTI - Cultures growing Escherichia coli, ESBL, stopped Augmentin, started on fosfomycin 3 g once today, will repeat in 2 days.  Elevated PSA - Will need to follow with urology as an outpatient, has been followed by urology Dr. Eliberto Richardson in [place] before 10 years where he had a biopsy, most likely due to elevated PSA level and as well.     Code Status: Full  Family Communication: Wife at bedsid           Medications  Scheduled Meds: . acidophilus  2 capsule Oral Daily  . allopurinol  100 mg Oral Daily  .  carvedilol  3.125 mg Oral BID WC  . clopidogrel  75 mg Oral Daily  . colchicine  0.6 mg Oral Daily  . [START ON 06/18/2014] fosfomycin  3 g Oral Once  . [START ON 06/17/2014] furosemide  40 mg Oral Daily  . heparin  5,000 Units Subcutaneous 3 times per day  . insulin aspart  0-20 Units Subcutaneous TID WC  . insulin glargine  22 Units Subcutaneous Daily  . pravastatin  20 mg Oral Daily  . [START ON 06/17/2014] predniSONE  10 mg Oral Q breakfast  . pregabalin  75 mg Oral Daily  . psyllium  1 packet Oral QHS  . sertraline  50 mg Oral QHS   Continuous Infusions:  PRN Meds:.[DISCONTINUED] acetaminophen **OR** acetaminophen, acetaminophen, bisacodyl, HYDROcodone-acetaminophen, hydrocortisone cream, ipratropium-albuterol, methocarbamol, ondansetron **OR** ondansetron (ZOFRAN) IV, polyethylene glycol, sorbitol, traMADol-acetaminophen  DVT Prophylaxis   Heparin  Lab Results  Component Value Date   PLT 278 06/14/2014    Antibiotics    Anti-infectives    Start     Dose/Rate Route Frequency Ordered Stop   06/13/14 0815  amoxicillin-clavulanate (AUGMENTIN) 500-125 MG per tablet 500 mg  Status:  Discontinued    Comments:  MD aware of Rocephin allergy, would like to proceed with treatment   1 tablet Oral Every 12 hours 06/13/14 0802 06/15/14 1333   06/12/14 2000  amoxicillin-clavulanate (AUGMENTIN) 875-125 MG per tablet 1 tablet  Status:  Discontinued     1 tablet Oral Every 12 hours 06/12/14 1823 06/12/14 1826          Objective:   Filed Vitals:   06/15/14 1759 06/16/14 0500 06/16/14 0542 06/16/14 0755  BP: 125/69  161/71 158/77  Pulse: 68  67 71  Temp:   97.5 F (36.4 C)   TempSrc:   Oral   Resp:   16   Height:      Weight:  98.884 kg (218 lb)    SpO2:   96%     Wt Readings from Last 3 Encounters:  06/16/14 98.884 kg (218 lb)  06/11/14 103.874 kg (229 lb)  05/01/13 102.059 kg (225 lb)     Intake/Output Summary (Last 24 hours) at 06/16/14 1334 Last data filed at 06/16/14 1300  Gross per 24 hour  Intake    560 ml  Output   1175 ml  Net   -615 ml     Physical Exam  Awake Alert, Oriented X 3, No new F.N deficits, Normal affect [place].AT,PERRAL Supple Neck,No JVD, No cervical lymphadenopathy appriciated.  Symmetrical Chest wall movement, Good air movement bilaterally, CTAB RRR,No Gallops,Rubs or new Murmurs, No Parasternal Heave +ve B.Sounds, Abd Soft, No tenderness, No organomegaly appriciated, No rebound - guarding or rigidity. No Cyanosis, Clubbing , +1 bilateral lower extremity edema, No new Rash or bruise  , pain significantly subsided. Data Review   Micro Results Recent Results (from the past 240 hour(s))  Urine culture     Status: None   Collection Time: 06/12/14 11:22 AM  Result Value Ref Range Status   Specimen Description URINE, CATHETERIZED  Final   Special Requests NONE  Final   Colony Count    Final    >=100,000 COLONIES/ML Performed at [facility]    Culture   Final    ESCHERICHIA COLI Note: Confirmed Extended Spectrum Beta-Lactamase Producer (ESBL) CRITICAL RESULT CALLED TO, READ BACK BY AND VERIFIED WITH: [facility] 3/8 AT [place] Performed at [facility]    Report Status 06/15/2014 FINAL  Final   Organism ID, Bacteria ESCHERICHIA COLI  Final      Susceptibility   Escherichia coli - MIC*    AMPICILLIN >=32 RESISTANT Resistant     CEFAZOLIN >=64 RESISTANT Resistant     CEFTRIAXONE >=64 RESISTANT Resistant     CIPROFLOXACIN >=4 RESISTANT Resistant     GENTAMICIN <=1 SENSITIVE Sensitive     LEVOFLOXACIN >=8 RESISTANT Resistant     NITROFURANTOIN <=16 SENSITIVE Sensitive     TOBRAMYCIN >=16 RESISTANT Resistant     TRIMETH/SULFA >=320 RESISTANT Resistant     IMIPENEM <=0.25 SENSITIVE Sensitive     PIP/TAZO 8 SENSITIVE Sensitive     * ESCHERICHIA COLI    Radiology Reports No results found.  CBC  Recent Labs Lab 06/11/14 0400 06/14/14 0622  WBC 9.1 9.8  HGB 11.8* 11.4*  HCT 34.0* 33.8*  PLT 330 278  MCV 84.2 84.9  MCH 29.2 28.6  MCHC 34.7 33.7  RDW 12.6 12.9  LYMPHSABS  --  1.0  MONOABS  --  0.8  EOSABS  --  0.1  BASOSABS  --  0.0    Chemistries   Recent Labs Lab 06/10/14 0444 06/11/14 0400 06/11/14 2004 06/12/14 1610 06/14/14 0622 06/16/14 0547  NA 134* 134*  --  134* 135 134*  K 4.0 4.7  --  4.8 4.6 4.8  CL 97 101  --  101 104 103  CO2 25 25  --  28 25 25   GLUCOSE 161* 145*  --  176* 112* 103*  BUN 145* 138*  --  111* 92* 74*  CREATININE 4.02* 3.36* 2.98* 2.71* 2.35* 2.26*  CALCIUM 7.4* 7.5*  --  7.9* 7.9* 8.0*  AST 21  --   --   --  16  --   ALT 32  --   --   --  26  --   ALKPHOS 72  --   --   --  67  --   BILITOT 0.6  --   --   --  0.6  --    ------------------------------------------------------------------------------------------------------------------ estimated creatinine clearance is 33.2 mL/min (by C-G  formula based on Cr of 2.26). ------------------------------------------------------------------------------------------------------------------ No results for input(s): HGBA1C in the last 72 hours. ------------------------------------------------------------------------------------------------------------------ No results for input(s): CHOL, HDL, LDLCALC, TRIG, CHOLHDL, LDLDIRECT in the last 72 hours. ------------------------------------------------------------------------------------------------------------------ No results for input(s): TSH, T4TOTAL, T3FREE, THYROIDAB in the last 72 hours.  Invalid input(s): FREET3 ------------------------------------------------------------------------------------------------------------------ No results for input(s): VITAMINB12, FOLATE, FERRITIN, TIBC, IRON, RETICCTPCT in the last 72 hours.  Coagulation profile No results for input(s): INR, PROTIME in the last 168 hours.  No results for input(s): DDIMER in the last 72 hours.  Cardiac Enzymes No results for input(s): CKMB, TROPONINI, MYOGLOBIN in the last 168 hours.  Invalid input(s): CK ------------------------------------------------------------------------------------------------------------------ Invalid input(s): POCBNP     Time Spent in minutes   30 minutes  Jayr Lupercio M.D on 06/16/2014 at 1:34 PM  Between 7am to 7pm - Pager - [phone]  After 7pm go to [email or web address] - password [email or web address]  And look for the night coverage person covering for me after hours  [facility] Office  [phone]   **Disclaimer: This note may have been dictated with voice recognition software. Similar sounding words can inadvertently be transcribed and this note may contain transcription errors which may not have been corrected upon publication of note.**

## 2014-06-16 NOTE — Progress Notes (Signed)
Social Work Elease Hashimoto, LCSW Social Worker Signed  Patient Care Conference 06/16/2014  4:08 PM    Expand All Collapse All   Inpatient RehabilitationTeam Conference and Plan of Care Update Date: 06/16/2014   Time: 11;25 AM     Patient Name: ALTO GANDOLFO       Medical Record Number: [id number]  Date of Birth: [birth year] Sex: Male         Room/Bed: 4W19C/4W19C-01 Payor Info: Payor: MEDICARE / Plan: MEDICARE PART A AND B / Product Type: *No Product type* /    Admitting Diagnosis: Debility gout   Admit Date/Time:  06/11/2014  5:42 PM Admission Comments: No comment available   Primary Diagnosis:  <principal problem not specified> Principal Problem: <principal problem not specified>    Patient Active Problem List     Diagnosis  Date Noted   .  CAD in native artery     .  Chronic combined systolic and diastolic heart failure  28/36/6294   .  ESR raised  06/12/2014   .  Elevated PSA  06/12/2014   .  Debilitated  06/11/2014   .  Palliative care encounter  06/09/2014   .  Myalgia     .  Polyarthralgia     .  Primary gout     .  Secondary cardiomyopathy     .  Diabetes type 2, uncontrolled     .  History of CVA (cerebrovascular accident)     .  CKD (chronic kidney disease), stage IV  05/31/2014   .  Hypertension       Expected Discharge Date: Expected Discharge Date: 06/23/14  Team Members Present: Physician leading conference: Dr. Alysia Penna Social Worker Present: Ovidio Kin, LCSW Nurse Present: Elliot Cousin, RN PT Present: Cameron Sprang, PT OT Present: Benay Pillow, OT SLP Present: Windell Moulding, SLP PPS Coordinator present : Daiva Nakayama, RN, CRRN        Current Status/Progress  Goal  Weekly Team Focus   Medical     CHF stable except increased LE edema, orthostasis improved   Home D/C  D/C planning   Bowel/Bladder     continent of bowel and bladder  remain continent of bowel and bladder   offer toileting q2-3h    Swallow/Nutrition/ Hydration       na          ADL's     Min- mod A STS and functional transfers,Mod I grooming, Mod A LB and bathing, UB dressing with set up, decreased endurance with increase rest breaks for fatigue.  overall Min A level  self care training, pt and family training, activity tolerance, and functional transfers   Mobility     S-min A bed mobility and short distance w/c mobility, min-mod A sit <> stand and squat pivot/scoot transfers, gait using RW x 8 ft with +2 for safety  min A w/c level, may need upgraded due to rapid pt progress   functional mobility, activity tolerance, standing tolerance, w/c propulsion, patient/family education   Communication       na         Safety/Cognition/ Behavioral Observations      no unsafe behaviors         Pain     pain located in right ankle a  pain less than or equal to 4 on a scale of 0-10  assess for pain q4ha nd prn, medicate as indicated    Skin     no skin issues  at this time  remain free from skin injury/breakdown   assess skin q shift and prn      *See Care Plan and progress notes for long and short-term goals.    Barriers to Discharge:  needs med adjustmnt and monitoring BP      Possible Resolutions to Barriers:   May need anothe r1-2 d     Discharge Planning/Teaching Needs:   Home with wife who can provide 24 hr care-plans to be here and attend therapies with pt       Team Discussion:    Medically more stable-still checking labs and on lasix. Limited self awareness-feels dong more than really is. Wants to go home ASAP. Goals-min wheelchair-currently plus 2 for safety. Aware if progresses quicker can go home sooner. Wife here observing in therapies. Palliative care saw on acute-followed up today   Revisions to Treatment Plan:    None    Continued Need for Acute Rehabilitation Level of Care: The patient requires daily medical management by a physician with specialized training in physical medicine and rehabilitation for the following conditions: Daily direction of  a multidisciplinary physical rehabilitation program to ensure safe treatment while eliciting the highest outcome that is of practical value to the patient.: Yes Daily medical management of patient stability for increased activity during participation in an intensive rehabilitation regime.: Yes Daily analysis of laboratory values and/or radiology reports with any subsequent need for medication adjustment of medical intervention for : Other;Cardiac problems;Pulmonary problems  Elease Hashimoto 06/16/2014, 4:08 PM                  Patient ID: Kathi Ludwig, male   DOB: [birth year], 74 y.o.   MRN: [id number]

## 2014-06-16 NOTE — Progress Notes (Signed)
Social Work Patient ID: Darrell Richardson, male   DOB: 1940/04/16, 74 y.o.   MRN: [id number] Met with pt and wife who was here to attend therapies with pt, discussed goals-min assist wheelchair level and discharge 3/16.  Pt was very disappointed and wants to go home By Friday.  He according to MD has medical issues and is working on getting stronger.  Wife is on board and feels one more week is necessary and will encourage pt. Pt seems to lack insight and feels he is doing everything for himself right now, but he is not.  Will train wife and make sure she is comfortable with pt's care prior to discharge. Work on discharge next Wed.

## 2014-06-17 ENCOUNTER — Inpatient Hospital Stay (HOSPITAL_COMMUNITY): Payer: Medicare Other | Admitting: Rehabilitation

## 2014-06-17 ENCOUNTER — Inpatient Hospital Stay (HOSPITAL_COMMUNITY): Payer: Medicare Other | Admitting: Occupational Therapy

## 2014-06-17 LAB — GLUCOSE, CAPILLARY
Glucose-Capillary: 99 mg/dL (ref 70–99)
Glucose-Capillary: 170 mg/dL — ABNORMAL HIGH (ref 70–99)
Glucose-Capillary: 160 mg/dL — ABNORMAL HIGH (ref 70–99)
Glucose-Capillary: 135 mg/dL — ABNORMAL HIGH (ref 70–99)

## 2014-06-17 MED ORDER — FUROSEMIDE 40 MG PO TABS
60.0000 mg | ORAL_TABLET | Freq: Every day | ORAL | Status: DC
  Administered 2014-06-18: 60 mg via ORAL
  Filled 2014-06-17 (×2): qty 1

## 2014-06-17 MED ORDER — FUROSEMIDE 40 MG PO TABS
40.0000 mg | ORAL_TABLET | Freq: Once | ORAL | Status: AC
  Administered 2014-06-17: 40 mg via ORAL
  Filled 2014-06-17: qty 1

## 2014-06-17 MED ORDER — INSULIN GLARGINE 100 UNIT/ML ~~LOC~~ SOLN
24.0000 [IU] | Freq: Every day | SUBCUTANEOUS | Status: DC
  Administered 2014-06-18 – 2014-06-20 (×3): 24 [IU] via SUBCUTANEOUS
  Filled 2014-06-17 (×4): qty 0.24

## 2014-06-17 MED ORDER — INSULIN GLARGINE 100 UNIT/ML ~~LOC~~ SOLN
26.0000 [IU] | Freq: Every day | SUBCUTANEOUS | Status: DC
  Filled 2014-06-17: qty 0.26

## 2014-06-17 NOTE — Progress Notes (Signed)
Occupational Therapy Session Note  Patient Details  Name: Darrell Richardson MRN: [id number] Date of Birth: May 11, 1940  Today's Date: 06/17/2014 OT Individual Time: [phone]  (make-up session) OT Individual Time Calculation (min): 20 min    Short Term Goals: Week 1:  OT Short Term Goal 1 (Week 1): Pt.  will bathe UB EOB with mod assist OT Short Term Goal 2 (Week 1): Pt. will perform lateral leans with mod assist during bathing OT Short Term Goal 3 (Week 1): Pt. will dress UB with SBA for dynamic sitting balance OT Short Term Goal 4 (Week 1): Pt. will don pants with mod assist using AE PRN OT Short Term Goal 5 (Week 1): Pt. will transfer to [facility] with sliding board with mod assist  Skilled Therapeutic Interventions/Progress Updates:    MAKE-UP SESSION: Pt seen for 1:1 OT session with focus on bed mobility, functional transfers, and activity tolerance. Pt received sitting in w/c agreeable to make-up session with min cues of encouragement. Pt ambulated approx 6' at min A level using RW w/c<>bed. Completed supine<>sit at supervision level with HOB flat and no bed rails. Educated on walk-in shower transfer via stepping backwards in, however pt and wife reports RW will fit inside shower with space to turn. Practiced stepping over 2" clearance to simulate shower transfer. Pt completed 2x with min A using RW. Pt fatigued and left sitting in w/c with all needs in reach.   Therapy Documentation Precautions:  Precautions Precautions: Fall Precaution Comments: WBAT on R leg (knee pain due to gout; also in hands, toes) Restrictions Weight Bearing Restrictions: No General:   Vital Signs: Therapy Vitals Temp: 97.5 F (36.4 C) Temp Source: Oral Pulse Rate: 62 Resp: 18 BP: (!) 122/59 mmHg Oxygen Therapy SpO2: 98 % O2 Device: Not Delivered Pain: Pain Assessment Pain Assessment: 0-10 Pain Score: 6  Pain Type: Chronic pain Pain Location: Ankle Pain Orientation: Left Pain Intervention(s):  Medication (See eMAR)  See FIM for current functional status  Therapy/Group: Individual Therapy  Duayne Cal 06/17/2014, 9:57 AM

## 2014-06-17 NOTE — Progress Notes (Addendum)
Subjective/Complaints: No CP or SOB Wife at bedside Objective: Vital Signs: Blood pressure 122/59, pulse 62, temperature 97.5 F (36.4 C), temperature source Oral, resp. rate 18, height _0  (1.727 m), weight 97.2 kg (214 lb 4.6 oz), SpO2 98 %. No results found. Results for orders placed or performed during the hospital encounter of 06/11/14 (from the past 72 hour(s))  Glucose, capillary     Status: Abnormal   Collection Time: 06/14/14 11:42 AM  Result Value Ref Range   Glucose-Capillary 131 (H) 70 - 99 mg/dL  Glucose, capillary     Status: Abnormal   Collection Time: 06/14/14  4:26 PM  Result Value Ref Range   Glucose-Capillary 150 (H) 70 - 99 mg/dL  Glucose, capillary     Status: Abnormal   Collection Time: 06/14/14  9:01 PM  Result Value Ref Range   Glucose-Capillary 119 (H) 70 - 99 mg/dL   Comment 1 Notify RN   Glucose, capillary     Status: Abnormal   Collection Time: 06/14/14 11:53 PM  Result Value Ref Range   Glucose-Capillary 106 (H) 70 - 99 mg/dL  Glucose, capillary     Status: Abnormal   Collection Time: 06/15/14  7:19 AM  Result Value Ref Range   Glucose-Capillary 100 (H) 70 - 99 mg/dL  Glucose, capillary     Status: Abnormal   Collection Time: 06/15/14 11:34 AM  Result Value Ref Range   Glucose-Capillary 127 (H) 70 - 99 mg/dL  Glucose, capillary     Status: Abnormal   Collection Time: 06/15/14  4:33 PM  Result Value Ref Range   Glucose-Capillary 157 (H) 70 - 99 mg/dL  Glucose, capillary     Status: Abnormal   Collection Time: 06/15/14  9:05 PM  Result Value Ref Range   Glucose-Capillary 136 (H) 70 - 99 mg/dL  Basic metabolic panel     Status: Abnormal   Collection Time: 06/16/14  5:47 AM  Result Value Ref Range   Sodium 134 (L) 135 - 145 mmol/L   Potassium 4.8 3.5 - 5.1 mmol/L   Chloride 103 96 - 112 mmol/L   CO2 25 19 - 32 mmol/L   Glucose, Bld 103 (H) 70 - 99 mg/dL   BUN 74 (H) 6 - 23 mg/dL   Creatinine, Ser 2.26 (H) 0.50 - 1.35 mg/dL   Calcium  8.0 (L) 8.4 - 10.5 mg/dL   GFR calc non Af Amer 27 (L) >90 mL/min   GFR calc Af Amer 31 (L) >90 mL/min    Comment: (NOTE) The eGFR has been calculated using the CKD EPI equation. This calculation has not been validated in all clinical situations. eGFR's persistently <90 mL/min signify possible Chronic Kidney Disease.    Anion gap 6 5 - 15  Glucose, capillary     Status: Abnormal   Collection Time: 06/16/14  6:45 AM  Result Value Ref Range   Glucose-Capillary 102 (H) 70 - 99 mg/dL  Glucose, capillary     Status: Abnormal   Collection Time: 06/16/14 11:38 AM  Result Value Ref Range   Glucose-Capillary 173 (H) 70 - 99 mg/dL   Comment 1 Notify RN   Glucose, capillary     Status: Abnormal   Collection Time: 06/16/14  4:58 PM  Result Value Ref Range   Glucose-Capillary 199 (H) 70 - 99 mg/dL  Glucose, capillary     Status: Abnormal   Collection Time: 06/16/14  8:51 PM  Result Value Ref Range   Glucose-Capillary 236 (H)  70 - 99 mg/dL   Comment 1 Notify RN   Glucose, capillary     Status: None   Collection Time: 06/17/14  6:34 AM  Result Value Ref Range   Glucose-Capillary 99 70 - 99 mg/dL   Comment 1 Notify RN      HEENT: normal Cardio: RRR and no murmur Resp: CTA B/L and unlabored GI: BS positive and NT, ND Extremity:  Edema scrotal and pretibial edema Skin:   Other erythema around scrotum Neuro: Alert/Oriented, Anxious, Normal Sensory and Abnormal Motor 4/5 bilateral delt bi tri grip, 3- bilateral HF, KE, ADF APF Musc/Skel:  Other no lower ext pain with ROM, edema at ankles,  mild erythema in feet Gen NAD   Assessment/Plan: 1. 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 Physiatrist is providing close team supervision and 24 hour management of active medical problems listed below. Physiatrist and rehab team continue to  assess barriers to discharge/monitor patient progress toward functional and medical goals. Discussed D/C date 3/16  With need for f/u therapy   FIM: FIM - Bathing Bathing Steps Patient Completed: Chest, Right Arm, Left Arm, Abdomen, Front perineal area, Buttocks, Right upper leg, Left upper leg Bathing: 4: Min-Patient completes 8-9 55f10 parts or 75+ percent  FIM - Upper Body Dressing/Undressing Upper body dressing/undressing steps patient completed: Thread/unthread right sleeve of pullover shirt/dresss, Thread/unthread left sleeve of pullover shirt/dress, Put head through opening of pull over shirt/dress, Pull shirt over trunk Upper body dressing/undressing: 5: Set-up assist to: Obtain clothing/put away FIM - Lower Body Dressing/Undressing Lower body dressing/undressing steps patient completed: Thread/unthread right pants leg, Thread/unthread left pants leg, Pull pants up/down Lower body dressing/undressing: 2: Max-Patient completed 25-49% of tasks  FIM - Toileting Toileting: 0: No continent bowel/bladder events this shift  FIM - TRadio producerDevices: Bedside commode, Sliding board Toilet Transfers: 0-Activity did not occur  FIM - BControl and instrumentation engineerDevices: Arm rests Bed/Chair Transfer: 5: Supine > Sit: Supervision (verbal cues/safety issues), 4: Bed > Chair or W/C: Min A (steadying Pt. > 75%)  FIM - Locomotion: Wheelchair Distance: 80 Locomotion: Wheelchair: 2: Travels 50 - 149 ft with supervision, cueing or coaxing FIM - Locomotion: Ambulation Locomotion: Ambulation Assistive Devices: WAdministratorAmbulation/Gait Assistance: 1: +2 Total assist, 4: Min assist Locomotion: Ambulation: 1: Two helpers  Comprehension Comprehension Mode: Auditory Comprehension: 5-Follows basic conversation/direction: With no assist  Expression Expression Mode: Verbal Expression: 5-Expresses basic needs/ideas: With no assist  Social  Interaction Social Interaction: 6-Interacts appropriately with others with medication or extra time (anti-anxiety, antidepressant).  Problem Solving Problem Solving Mode: Not assessed Problem Solving: 4-Solves basic 75 - 89% of the time/requires cueing 10 - 24% of the time  Memory Memory: 5-Recognizes or recalls 90% of the time/requires cueing < 10% of the time  Medical Problem List and Plan: 1. Functional deficits secondary to debilitation after CHF exacerbation/respiratory failure/question polyarticular gout. 2.  DVT Prophylaxis/Anticoagulation: Subcutaneous heparin. Monitor platelet counts and any signs of bleeding 3. Pain Management: Lyrica 75 mg daily, Hydrocodone as needed. Monitor with increased mobility 4. Mood/depression. Zoloft 50 mg daily. Provide emotional support 5. Neuropsych: This patient is capable of making decisions on his own behalf. 6. Skin/Wound Care: Routine skin checks 7. Fluids/Electrolytes/Nutrition: Strict I&O with follow-up chemistries 8. Chronic renal insufficiency with baseline creatinine 2.44, currently 2.35 and stabilizing. It was felt elevation possibly  due to prednisone therapy as well as in the setting of decompensated CHF and hypotension. Renal service to follow-up 9. 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 No significant improvement with steroids. Negative TEE. Negative anti-histone DNA. Await further studies. Continue Solu-Medrol for now and plan Taper 10. Acute on chronic diastolic congestive heart failure. Continue to monitor with any signs of fluid overload. Lasix has since been discontinued 11. Diabetes mellitus with peripheral neuropathy. Monitor closely while on Solu-Medrol. Hemoglobin A1c 7.5. Lantus insulin 18 units daily. Check blood sugars before meals and at bedtime 12. History of CVA with left hemiparesis. Continue Plavix 13.  Elevated PSA pt has had prostate biopsy about 58yr ago, ?f/u with urology as outpt given all the medical issues currently  in play LOS  14.   UTI ecoli ESBL on Fosfomycin   (Days) 6 A FACE TO FACE EVALUATION WAS PERFORMED  KIRSTEINS,ANDREW E  06/17/2014, 8:20 AM

## 2014-06-17 NOTE — Progress Notes (Signed)
Patient Demographics  Darrell Richardson, is a 74 y.o. male, DOB - 07/26/1940, DTO:[id number]  Adm joint pain improving joint pain improving, weakness and deconditioning weakness and deconditioningit date - 06/11/2014   Admitting Physician Charlett Blake, MD  Outpatient Primary MD for the patient is [name], MD  LOS - 6   No chief complaint on file.                                                             Consult follow-up note   Subjective:   Darrell Richardson today has, No headache, No chest pain, No abdominal pain - No Nausea, No new weakness tingling or numbness, No Cough - SOB. Joints pain improving.  Assessment & Plan    Active Problems:   Hypertension   CKD (chronic kidney disease), stage IV   Diabetes type 2, uncontrolled   Primary gout   Myalgia   Polyarthralgia   Debilitated   Chronic combined systolic and diastolic heart failure   ESR raised   Elevated PSA   CAD in native artery  Weakness and deconditioning: - This is most likely related to muscle disuse secondary to pain from polyarthralgia. - Management as per CIR team, main goal is to control pain prior to physical therapy sessions. - Significant improvement since admission to CIR.  Polyarthralgia/gout flare - most likely related to gout, as per orthopedic consult. - paraneoplastic workup/autoimmune/infectious workup has been nonrevealing. - Significant improvement of pain after being started on steroids and colchicine . -Pain almost resolved, will discontinue prednisone - Continue with colchicine, monitor closely given his renal function, (  colchicine was increased to 0.6 mg oral daily 3/8, monitor closely avoid NSAIDs. - Continue with allopurinol  Acute on chronic renal failure - Continues to improve, creatinine is 2.3, patient baseline is 2.5 as an outpatient, patient having  significant lower extremity edema, will increase his Lasix to 60 mg oral daily ( home dose is 80 mg oral daily), will recheck BMP in a.m.Marland Kitchen  Type 2 diabetesLantus increased to 24 units dailynue with insulin sliding scale  Cardiomyopathy /  chronic diastolic and systolic CHF - appears well compensated at present , on 40 mg oral Lasix.  History of CVA - Continue with Plavix  Urinary retention - Foley catheter discontinued, resolved, .  UTI - Cultures growing Escherichia coli, ESBL, stopped Augmentin, started on fosfomycin 3 g once today, will repeat in 2 days.  Elevated PSA - Will need to follow with urology as an outpatient, has been followed by urology Dr. [name] in [place] before 10 years where he had a biopsy, most likely due to elevated PSA level and as well.     Code Status: Full  Family Communication: Wife at bedsid           Medications  Scheduled Meds: . acidophilus  2 capsule Oral Daily  . allopurinol  100 mg Oral Daily  . carvedilol  3.125 mg Oral BID WC  . clopidogrel  75 mg Oral Daily  . colchicine  0.6 mg  Oral Daily  . [START ON 06/18/2014] fosfomycin  3 g Oral Once  . furosemide  40 mg Oral Once  . [START ON 06/18/2014] furosemide  60 mg Oral Daily  . heparin  5,000 Units Subcutaneous 3 times per day  . insulin aspart  0-20 Units Subcutaneous TID WC  . [START ON 06/18/2014] insulin glargine  24 Units Subcutaneous Daily  . pravastatin  20 mg Oral Daily  . pregabalin  75 mg Oral Daily  . psyllium  1 packet Oral QHS  . sertraline  50 mg Oral QHS   Continuous Infusions:  PRN Meds:.[DISCONTINUED] acetaminophen **OR** acetaminophen, acetaminophen, bisacodyl, HYDROcodone-acetaminophen, hydrocortisone cream, ipratropium-albuterol, methocarbamol, ondansetron **OR** ondansetron (ZOFRAN) IV, polyethylene glycol, sorbitol, traMADol-acetaminophen  DVT Prophylaxis  Heparin  Lab Results  Component Value Date   PLT 278 06/14/2014    Antibiotics     Anti-infectives    Start     Dose/Rate Route Frequency Ordered Stop   06/13/14 0815  amoxicillin-clavulanate (AUGMENTIN) 500-125 MG per tablet 500 mg  Status:  Discontinued    Comments:  MD aware of Rocephin allergy, would like to proceed with treatment   1 tablet Oral Every 12 hours 06/13/14 0802 06/15/14 1333   06/12/14 2000  amoxicillin-clavulanate (AUGMENTIN) 875-125 MG per tablet 1 tablet  Status:  Discontinued     1 tablet Oral Every 12 hours 06/12/14 1823 06/12/14 1826          Objective:   Filed Vitals:   06/16/14 1501 06/16/14 1801 06/17/14 0602 06/17/14 0802  BP: 127/59 128/62  122/59  Pulse: 61 65  62  Temp: 98.2 F (36.8 C)  97.5 F (36.4 C)   TempSrc: Oral  Oral   Resp: 16  18   Height:      Weight:   97.2 kg (214 lb 4.6 oz)   SpO2: 100%  98%     Wt Readings from Last 3 Encounters:  06/17/14 97.2 kg (214 lb 4.6 oz)  06/11/14 103.874 kg (229 lb)  05/01/13 102.059 kg (225 lb)     Intake/Output Summary (Last 24 hours) at 06/17/14 1444 Last data filed at 06/17/14 1330  Gross per 24 hour  Intake    240 ml  Output   2310 ml  Net  -2070 ml     Physical Exam  Awake Alert, Oriented X 3, No new F.N deficits, Normal affect [place].AT,PERRAL Supple Neck,No JVD, No cervical lymphadenopathy appriciated.  Symmetrical Chest wall movement, Good air movement bilaterally, CTAB RRR,No Gallops,Rubs or new Murmurs, No Parasternal Heave +ve B.Sounds, Abd Soft, No tenderness, No organomegaly appriciated, No rebound - guarding or rigidity. No Cyanosis, Clubbing , +2 bilateral lower extremity edema, No new Rash or bruise  , no pain at the ankle. Data Review   Micro Results Recent Results (from the past 240 hour(s))  Urine culture     Status: None   Collection Time: 06/12/14 11:22 AM  Result Value Ref Range Status   Specimen Description URINE, CATHETERIZED  Final   Special Requests NONE  Final   Colony Count   Final    >=100,000 COLONIES/ML Performed at [facility]    Culture   Final    ESCHERICHIA COLI Note: Confirmed Extended Spectrum Beta-Lactamase Producer (ESBL) CRITICAL RESULT CALLED TO, READ BACK BY AND VERIFIED WITH: [facility] 3/8 AT 80 BY DUNNJ Performed at [facility]    Report Status 06/15/2014 FINAL  Final   Organism ID, Bacteria ESCHERICHIA COLI  Final  Susceptibility   Escherichia coli - MIC*    AMPICILLIN >=32 RESISTANT Resistant     CEFAZOLIN >=64 RESISTANT Resistant     CEFTRIAXONE >=64 RESISTANT Resistant     CIPROFLOXACIN >=4 RESISTANT Resistant     GENTAMICIN <=1 SENSITIVE Sensitive     LEVOFLOXACIN >=8 RESISTANT Resistant     NITROFURANTOIN <=16 SENSITIVE Sensitive     TOBRAMYCIN >=16 RESISTANT Resistant     TRIMETH/SULFA >=320 RESISTANT Resistant     IMIPENEM <=0.25 SENSITIVE Sensitive     PIP/TAZO 8 SENSITIVE Sensitive     * ESCHERICHIA COLI    Radiology Reports No results found.  CBC  Recent Labs Lab 06/11/14 0400 06/14/14 0622  WBC 9.1 9.8  HGB 11.8* 11.4*  HCT 34.0* 33.8*  PLT 330 278  MCV 84.2 84.9  MCH 29.2 28.6  MCHC 34.7 33.7  RDW 12.6 12.9  LYMPHSABS  --  1.0  MONOABS  --  0.8  EOSABS  --  0.1  BASOSABS  --  0.0    Chemistries   Recent Labs Lab 06/11/14 0400 06/11/14 2004 06/12/14 1610 06/14/14 0622 06/16/14 0547  NA 134*  --  134* 135 134*  K 4.7  --  4.8 4.6 4.8  CL 101  --  101 104 103  CO2 25  --  28 25 25   GLUCOSE 145*  --  176* 112* 103*  BUN 138*  --  111* 92* 74*  CREATININE 3.36* 2.98* 2.71* 2.35* 2.26*  CALCIUM 7.5*  --  7.9* 7.9* 8.0*  AST  --   --   --  16  --   ALT  --   --   --  26  --   ALKPHOS  --   --   --  67  --   BILITOT  --   --   --  0.6  --    ------------------------------------------------------------------------------------------------------------------ estimated creatinine clearance is 32.9 mL/min (by C-G formula based on Cr of  2.26). ------------------------------------------------------------------------------------------------------------------ No results for input(s): HGBA1C in the last 72 hours. ------------------------------------------------------------------------------------------------------------------ No results for input(s): CHOL, HDL, LDLCALC, TRIG, CHOLHDL, LDLDIRECT in the last 72 hours. ------------------------------------------------------------------------------------------------------------------ No results for input(s): TSH, T4TOTAL, T3FREE, THYROIDAB in the last 72 hours.  Invalid input(s): FREET3 ------------------------------------------------------------------------------------------------------------------ No results for input(s): VITAMINB12, FOLATE, FERRITIN, TIBC, IRON, RETICCTPCT in the last 72 hours.  Coagulation profile No results for input(s): INR, PROTIME in the last 168 hours.  No results for input(s): DDIMER in the last 72 hours.  Cardiac Enzymes No results for input(s): CKMB, TROPONINI, MYOGLOBIN in the last 168 hours.  Invalid input(s): CK ------------------------------------------------------------------------------------------------------------------ Invalid input(s): POCBNP     Time Spent in minutes   30 minutes  Tennille Montelongo M.D on 06/17/2014 at 2:44 PM  Between 7am to 7pm - Pager - [phone]  After 7pm go to [email or web address] - password [email or web address]  And look for the night coverage person covering for me after hours  [facility] Office  [phone]   **Disclaimer: This note may have been dictated with voice recognition software. Similar sounding words can inadvertently be transcribed and this note may contain transcription errors which may not have been corrected upon publication of note.**

## 2014-06-17 NOTE — Progress Notes (Signed)
Occupational Therapy Session Note  Patient Details  Name: Darrell Richardson MRN: [id number] Date of Birth: 1940-06-12  Today's Date: 06/17/2014 OT Individual Time: 0712-1975 and 1130-1200 OT Individual Time Calculation (min): 60 min and 30 min   Short Term Goals: Week 1:  OT Short Term Goal 1 (Week 1): Pt.  will bathe UB EOB with mod assist OT Short Term Goal 2 (Week 1): Pt. will perform lateral leans with mod assist during bathing OT Short Term Goal 3 (Week 1): Pt. will dress UB with SBA for dynamic sitting balance OT Short Term Goal 4 (Week 1): Pt. will don pants with mod assist using AE PRN OT Short Term Goal 5 (Week 1): Pt. will transfer to [facility] with sliding board with mod assist  Skilled Therapeutic Interventions/Progress Updates:   Session 1: Upon entering the room, pt seated in wheelchair with wife present in room. Pt with 4/10 c/o pain in B feet from "gout" per pt. Skilled OT session with focus on self care retraining, functional transfers, STS, dynamic balance, and pt/family education. Pt requesting shower this session and propelled self in wheelchair towards bathroom. Pt requiring assistance to set up wheelchair for transfer as well as min verbal cues for safety such as locking wheelchair brakes. Mod A stand pivot transfer onto TTB with use of grab bar. Bathing completed at shower level. Transferring back into wheelchair with Mod A squat pivot and getting dressed at sink side from wheelchair. STS with Min A this session and Min A for standing balance during dressing tasks. Mod I grooming at sink with use of electric razor as well. Pt seated in wheelchair with breakfast tray placed in front of him. Call bell within reach and wife remains in room.    Session 2: Upon entering the room, pt transitioning easily from PT session but reports fatigue. Wife remains present in room during session for observation. Pt ambulated with RW and Min A for balance into bathroom for steady assist toilet  transfers. Pt ambulating back with RW to sink to wash hands with steady assist. Posterior lean noted when both UEs unsupported while washing hands. Pt ambulated back to wheelchair for therapeutic exercise and education. OT educated pt on multiple ways for pressure relief such as wheelchair push ups. OT demonstrated with pt performing 3 sets of 5 reps of wheelchair push ups. Pt requiring increased rest breaks secondary to fatigue. OT educated pt on pursed lip breathing during exercises as he was observed to be holding breath during tasks. Pt seated in wheelchair with call bell within reach upon entering the room.   Therapy Documentation Precautions:  Precautions Precautions: Fall Precaution Comments: WBAT on R leg (knee pain due to gout; also in hands, toes) Restrictions Weight Bearing Restrictions: No General:   Vital Signs: Therapy Vitals Pulse Rate: 62 BP: (!) 122/59 mmHg Pain: Pain Assessment Pain Assessment: 0-10 Pain Score: 4  Pain Type: Chronic pain Pain Location: Ankle Pain Orientation: Right;Left Pain Descriptors / Indicators: Aching;Sore Pain Onset: With Activity Pain Intervention(s): Rest Multiple Pain Sites: No  See FIM for current functional status  Therapy/Group: Individual Therapy  Darrell Richardson 06/17/2014, 11:08 AM

## 2014-06-17 NOTE — Progress Notes (Signed)
Physical Therapy Make-Up Sessions Note  Patient Details  Name: Darrell Richardson MRN: [id number] Date of Birth: 1940-09-23  Today's Date: 06/17/2014 PT Individual Time: 1100-1130 Treatment Session 2: 3343-5686 PT Individual Time Calculation (min): 30 min  Treatment Session 2:10 min  Short Term Goals: Week 1:  PT Short Term Goal 1 (Week 1): Pt will demonstrate rolling R and L in flat bed without rail req min A.  PT Short Term Goal 2 (Week 1): Pt will demonstrate supine to/from sit transfer req mod A each direction.  PT Short Term Goal 3 (Week 1): Pt will be a 1 person transfer from bed to/from w/c.  PT Short Term Goal 4 (Week 1): Pt will initiate standing during PT session (standing frame or with walker) PT Short Term Goal 5 (Week 1): Pt will self propel manual w/c x 50' req SBA on level surface.   Skilled Therapeutic Interventions/Progress Updates:    Treatment Session 1: Therapeutic Exercise: PT instructs pt in LE strengthening with PT providing manual resistance for knee extension/flexion, hip flexion/extension, hip abduction/adduction while pt sits in w/c x 10 reps each.  PT instructs pt in sit to stand from w/c with armrests x 10 reps each req supervision to stand and 2 LOB posterior into chair initially, but pt progresses to SBA as standing balance without AD improves.  PT instructs pt in ankle pumps x 10 reps each and educates pt that this is useful for edema management, as well as sitting up in recliner for prolonged periods of time and elevating feet with legrest.   Treatment Session 2: Therapeutic Activity: Pt received in bed with wife present - pt had slid down towards bottom of bed while HOB was elevated and was positioning diagonal in bed. PT flattens out bed and removes siderails and instructs pt in scooting towards HOB req min A to stabilize feet on the mattress. Pt reports he can sit up from bed on his own without physical assist and attempts this multiple times using a momentum  method kicking his R leg forward and crunching his abdomen, but he is unsuccessful. PT instructs pt in rolling onto L side req verbal cues for technique and min A, and in L side lie to sit transfer req min A - pt immediately reverts onto his back and req PT assist to return to side lie to sit up. Once up, pt requests to sit up in w/c and so PT instructs pt in stand-step transfer bed to w/c with wide RW req SBA for safety. Pt left up in w/c with all needs in reach and wife present.   Pt is progressing with functional mobility, but continues to require multiple rest breaks due to low activity tolerance. Continue per PT POC.   Therapy Documentation Precautions:  Precautions Precautions: Fall Precaution Comments: WBAT on R leg (knee pain due to gout; also in hands, toes) Restrictions Weight Bearing Restrictions: No Pain: Pain Assessment Pain Assessment: 0-10 Pain Score: 4  Pain Type: Chronic pain Pain Location: Ankle Pain Orientation: Right;Left Pain Descriptors / Indicators: Aching;Sore Pain Onset: With Activity Pain Intervention(s): Rest Multiple Pain Sites: No  Treatment Session 2: Pt c/o 5/10 L ankle pain from gout with activity and reports it abolishes once he is seated.   See FIM for current functional status  Therapy/Group: Individual Therapy  [name] 06/17/2014, 11:07 AM

## 2014-06-17 NOTE — Progress Notes (Signed)
Physical Therapy Session Note  Patient Details  Name: Darrell Richardson MRN: [id number] Date of Birth: [birth year]  Today's Date: 06/17/2014 PT Individual Time: 0830-0930 PT Individual Time Calculation (min): 60 min   Short Term Goals: Week 1:  PT Short Term Goal 1 (Week 1): Pt will demonstrate rolling R and L in flat bed without rail req min A.  PT Short Term Goal 2 (Week 1): Pt will demonstrate supine to/from sit transfer req mod A each direction.  PT Short Term Goal 3 (Week 1): Pt will be a 1 person transfer from bed to/from w/c.  PT Short Term Goal 4 (Week 1): Pt will initiate standing during PT session (standing frame or with walker) PT Short Term Goal 5 (Week 1): Pt will self propel manual w/c x 50' req SBA on level surface.   Skilled Therapeutic Interventions/Progress Updates:   Pt received sitting in w/c in room, agreeable to therapy.  Pt states that he is in better "mood" today and apologetic regarding yesterday's behavior.  Provided empathy and understanding.  Pt self propelled x 100' at S level using BUEs for overall strengthening and endurance to outside of therapy gym.  Performed gait x 75' x 1 and another 95' x 1 with RW at min/guard level with min cues for upright posture and increased stride length throughout.  Pt much more motivated to continue with gait today.  Large portion of session focused on car transfer to simulated car height.  Pts wife states that car is 80" high, however upon rising to this height, she then thinks it may be slightly lower.  Set up stool from home (very small and narrow) along with simulated running board in order to perform backwards.  Pt unable to step backwards onto stool due to fear of tipping stool, therefore pt states "let me show you how I usually do this."  Pt ambulated to car with RW, utilized R hand on RW and L hand to grab bar (he has in his car) and side stepped to stool then to running board and was able to elevate LE into car with min/mod A.  Feel  that he was pretty safe in this manner but would like to practice to actual car.  Discussed with wife to look at schedule tonight and to meet PT/pt outside tomorrow for first session in order to practice for increased safety.  Wife verbalized understanding.  Also provided education to wife/pt regarding safety at home, as pt likes to attempt to get up when PT not ready or beside him.  Educated that he needs to wait for wife to be there in case of LOB for assist if needed.  Also discussed that PT feels he will be able to ambulate short distances in home with wife, however pt and wife need to communicate if pt feeling increased fatigue or pain in ankles to use w/c as needed in the home.  Pt transferred back to w/c and propelled back to main gym.  Transferred to nustep with RW at min A level.  Cues for hand placement and safety when turning.  Performed seated nustep x 6 mins at level 4 resistance with BUEs/LEs in order to increase overall strengthening and endurance.  Transferred back to w/c and assisted back to room.  Left in w/c with all needs in reach.  Educated on safety when getting into restroom and to have staff with him for increased safety.  Pt and wife verbalized understanding.   Therapy Documentation Precautions:  Precautions  Precautions: Fall Precaution Comments: WBAT on R leg (knee pain due to gout; also in hands, toes) Restrictions Weight Bearing Restrictions: No   Vital Signs: Therapy Vitals Temp: 97.5 F (36.4 C) Temp Source: Oral Pulse Rate: 62 Resp: 18 BP: (!) 122/59 mmHg Oxygen Therapy SpO2: 98 % O2 Device: Not Delivered Pain: Pain Assessment Pain Assessment: 0-10 Pain Score: 6  Pain Type: Chronic pain Pain Location: Ankle Pain Orientation: Left Pain Intervention(s): Medication (See eMAR)   Locomotion : Ambulation Ambulation/Gait Assistance: 1: +2 Total assist;4: Min guard (chair follow for safety) Wheelchair Mobility Distance: 100   See FIM for current functional  status  Therapy/Group: Individual Therapy  Denice Bors 06/17/2014, 9:34 AM

## 2014-06-18 ENCOUNTER — Inpatient Hospital Stay (HOSPITAL_COMMUNITY): Payer: Medicare Other | Admitting: Occupational Therapy

## 2014-06-18 ENCOUNTER — Inpatient Hospital Stay (HOSPITAL_COMMUNITY): Payer: Medicare Other | Admitting: Rehabilitation

## 2014-06-18 DIAGNOSIS — E1165 Type 2 diabetes mellitus with hyperglycemia: Secondary | ICD-10-CM

## 2014-06-18 LAB — GLUCOSE, CAPILLARY
GLUCOSE-CAPILLARY: 84 mg/dL (ref 70–99)
GLUCOSE-CAPILLARY: 133 mg/dL — AB (ref 70–99)
Glucose-Capillary: 123 mg/dL — ABNORMAL HIGH (ref 70–99)
Glucose-Capillary: 87 mg/dL (ref 70–99)

## 2014-06-18 LAB — BASIC METABOLIC PANEL
ANION GAP: 8 (ref 5–15)
BUN: 55 mg/dL — ABNORMAL HIGH (ref 6–23)
CALCIUM: 7.9 mg/dL — AB (ref 8.4–10.5)
CO2: 25 mmol/L (ref 19–32)
Chloride: 103 mmol/L (ref 96–112)
Creatinine, Ser: 2.12 mg/dL — ABNORMAL HIGH (ref 0.50–1.35)
GFR calc Af Amer: 34 mL/min — ABNORMAL LOW (ref >90)
GFR calc non Af Amer: 29 mL/min — ABNORMAL LOW (ref >90)
Glucose, Bld: 102 mg/dL — ABNORMAL HIGH (ref 70–99)
Potassium: 4.8 mmol/L (ref 3.5–5.1)
Sodium: 136 mmol/L (ref 135–145)

## 2014-06-18 MED ORDER — FUROSEMIDE 80 MG PO TABS
80.0000 mg | ORAL_TABLET | Freq: Every day | ORAL | Status: DC
Start: 2014-06-19 — End: 2014-06-23
  Administered 2014-06-19 – 2014-06-23 (×5): 80 mg via ORAL
  Filled 2014-06-18 (×6): qty 1

## 2014-06-18 MED ORDER — FUROSEMIDE 40 MG PO TABS
40.0000 mg | ORAL_TABLET | Freq: Once | ORAL | Status: AC
  Administered 2014-06-18: 40 mg via ORAL
  Filled 2014-06-18: qty 1

## 2014-06-18 NOTE — Progress Notes (Signed)
   Chart reviewed. Stable from cardiac standpoint. I will check in on him again on Monday.   Please call with questions.   Daniel Bensimhon,MD 12:25 AM

## 2014-06-18 NOTE — Progress Notes (Signed)
Occupational Therapy Session Note  Patient Details  Name: Darrell Richardson MRN: [id number] Date of Birth: 05-16-1940  Today's Date: 06/18/2014 OT Individual Time: 3704-8889 OT Individual Time Calculation (min): 75 min    Short Term Goals: Week 1:  OT Short Term Goal 1 (Week 1): Pt.  will bathe UB EOB with mod assist OT Short Term Goal 2 (Week 1): Pt. will perform lateral leans with mod assist during bathing OT Short Term Goal 3 (Week 1): Pt. will dress UB with SBA for dynamic sitting balance OT Short Term Goal 4 (Week 1): Pt. will don pants with mod assist using AE PRN OT Short Term Goal 5 (Week 1): Pt. will transfer to [facility] with sliding board with mod assist  Skilled Therapeutic Interventions/Progress Updates:  Upon entering the room, pt reporting feeling unwell. He reports that he did not sleep well, had nose bleed, and is constipated. Pt ambulating with RW and min A to bathroom for BM. Pt required steady assist for transfer on and off off standard height toilet. Steady assist for clothing management and hygiene after BM. Pt engaging in bathing at shower level seated on TTB. OT provided LH sponge and pt utilized to wash B lower legs and feet for increased independence with task. Pt returned to sit in wheelchair at sink with RW and min A after shower completed. Wife entered room at this time and pt very emotional when telling her how he felt this morning. Pt performed STS for clothing management with Min A and use of RW. Pt able to cross legs over opposite knee to thread pants and don socks this session. Pt did begin to complain of 7/10 pain in B feet and RN alerted who later provided medications during session. Pt seated in wheelchair with wife present in room with call bell and all other needed items within reach.    Therapy Documentation Precautions:  Precautions Precautions: Fall Precaution Comments: WBAT on R leg (knee pain due to gout; also in hands, toes) Restrictions Weight Bearing  Restrictions: No Vital Signs: Therapy Vitals Temp: 98.4 F (36.9 C) Temp Source: Oral Pulse Rate: 81 Resp: 18 BP: 124/76 mmHg Patient Position (if appropriate): Lying Oxygen Therapy SpO2: 98 % O2 Device: Not Delivered  See FIM for current functional status  Therapy/Group: Individual Therapy  Phineas Semen 06/18/2014, 9:26 AM

## 2014-06-18 NOTE — Progress Notes (Signed)
Subjective/Complaints: Feeling better. Pain improving. Had a good night   Objective: Vital Signs: Blood pressure 124/76, pulse 81, temperature 98.4 F (36.9 C), temperature source Oral, resp. rate 18, height _0  (1.727 m), weight 98.3 kg (216 lb 11.4 oz), SpO2 98 %. No results found. Results for orders placed or performed during the hospital encounter of 06/11/14 (from the past 72 hour(s))  Glucose, capillary     Status: Abnormal   Collection Time: 06/15/14 11:34 AM  Result Value Ref Range   Glucose-Capillary 127 (H) 70 - 99 mg/dL  Glucose, capillary     Status: Abnormal   Collection Time: 06/15/14  4:33 PM  Result Value Ref Range   Glucose-Capillary 157 (H) 70 - 99 mg/dL  Glucose, capillary     Status: Abnormal   Collection Time: 06/15/14  9:05 PM  Result Value Ref Range   Glucose-Capillary 136 (H) 70 - 99 mg/dL  Basic metabolic panel     Status: Abnormal   Collection Time: 06/16/14  5:47 AM  Result Value Ref Range   Sodium 134 (L) 135 - 145 mmol/L   Potassium 4.8 3.5 - 5.1 mmol/L   Chloride 103 96 - 112 mmol/L   CO2 25 19 - 32 mmol/L   Glucose, Bld 103 (H) 70 - 99 mg/dL   BUN 74 (H) 6 - 23 mg/dL   Creatinine, Ser 2.26 (H) 0.50 - 1.35 mg/dL   Calcium 8.0 (L) 8.4 - 10.5 mg/dL   GFR calc non Af Amer 27 (L) >90 mL/min   GFR calc Af Amer 31 (L) >90 mL/min    Comment: (NOTE) The eGFR has been calculated using the CKD EPI equation. This calculation has not been validated in all clinical situations. eGFR's persistently <90 mL/min signify possible Chronic Kidney Disease.    Anion gap 6 5 - 15  Glucose, capillary     Status: Abnormal   Collection Time: 06/16/14  6:45 AM  Result Value Ref Range   Glucose-Capillary 102 (H) 70 - 99 mg/dL  Glucose, capillary     Status: Abnormal   Collection Time: 06/16/14 11:38 AM  Result Value Ref Range   Glucose-Capillary 173 (H) 70 - 99 mg/dL   Comment 1 Notify RN   Glucose, capillary     Status: Abnormal   Collection Time: 06/16/14   4:58 PM  Result Value Ref Range   Glucose-Capillary 199 (H) 70 - 99 mg/dL  Glucose, capillary     Status: Abnormal   Collection Time: 06/16/14  8:51 PM  Result Value Ref Range   Glucose-Capillary 236 (H) 70 - 99 mg/dL   Comment 1 Notify RN   Glucose, capillary     Status: None   Collection Time: 06/17/14  6:34 AM  Result Value Ref Range   Glucose-Capillary 99 70 - 99 mg/dL   Comment 1 Notify RN   Glucose, capillary     Status: Abnormal   Collection Time: 06/17/14 11:40 AM  Result Value Ref Range   Glucose-Capillary 170 (H) 70 - 99 mg/dL  Glucose, capillary     Status: Abnormal   Collection Time: 06/17/14  4:43 PM  Result Value Ref Range   Glucose-Capillary 160 (H) 70 - 99 mg/dL  Glucose, capillary     Status: Abnormal   Collection Time: 06/17/14  9:25 PM  Result Value Ref Range   Glucose-Capillary 135 (H) 70 - 99 mg/dL   Comment 1 Notify RN   Basic metabolic panel     Status: Abnormal  Collection Time: 06/18/14  6:43 AM  Result Value Ref Range   Sodium 136 135 - 145 mmol/L   Potassium 4.8 3.5 - 5.1 mmol/L   Chloride 103 96 - 112 mmol/L   CO2 25 19 - 32 mmol/L   Glucose, Bld 102 (H) 70 - 99 mg/dL   BUN 55 (H) 6 - 23 mg/dL   Creatinine, Ser 2.12 (H) 0.50 - 1.35 mg/dL   Calcium 7.9 (L) 8.4 - 10.5 mg/dL   GFR calc non Af Amer 29 (L) >90 mL/min   GFR calc Af Amer 34 (L) >90 mL/min    Comment: (NOTE) The eGFR has been calculated using the CKD EPI equation. This calculation has not been validated in all clinical situations. eGFR's persistently <90 mL/min signify possible Chronic Kidney Disease.    Anion gap 8 5 - 15  Glucose, capillary     Status: None   Collection Time: 06/18/14  6:52 AM  Result Value Ref Range   Glucose-Capillary 84 70 - 99 mg/dL   Comment 1 Notify RN      HEENT: normal Cardio: RRR and no murmur Resp: CTA B/L and unlabored GI: BS positive and NT, ND Extremity:  Edema scrotal and pretibial edema Skin:   Other erythema around scrotum Neuro:  Alert/Oriented, Anxious, Normal Sensory and Abnormal Motor 4/5 bilateral delt bi tri grip, 3- bilateral HF, KE, ADF APF Musc/Skel:  Other no lower ext pain with ROM, edema at ankles,  mild erythema in feet Gen NAD   Assessment/Plan: 1. 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 Physiatrist is providing close team supervision and 24 hour management of active medical problems listed below. Physiatrist and rehab team continue to assess barriers to discharge/monitor patient progress toward functional and medical goals. Discussed D/C date 3/16  With need for f/u therapy   FIM: FIM - Bathing Bathing Steps Patient Completed: Chest, Right Arm, Left Arm, Abdomen, Front perineal area, Buttocks, Right upper leg, Left upper leg, Right lower leg (including foot), Left lower leg (including foot) Bathing: 4: Steadying assist  FIM - Upper Body Dressing/Undressing Upper body dressing/undressing steps patient completed: Thread/unthread right sleeve of pullover shirt/dresss, Thread/unthread left sleeve of pullover shirt/dress, Put head through opening of pull over shirt/dress, Pull shirt over trunk Upper body dressing/undressing: 5: Set-up assist to: Obtain clothing/put away FIM - Lower Body Dressing/Undressing Lower body dressing/undressing steps patient completed: Thread/unthread right pants leg, Thread/unthread left pants leg, Pull pants up/down, Don/Doff right sock, Don/Doff left sock Lower body dressing/undressing: 4: Steadying Assist  FIM - Toileting Toileting steps completed by patient: Adjust clothing prior to toileting, Performs perineal hygiene, Adjust clothing after toileting Toileting: 4: Steadying assist  FIM - Radio producer Devices: Elevated toilet seat, Insurance account manager Transfers: 4-To toilet/BSC: Min A (steadying  Pt. > 75%), 4-From toilet/BSC: Min A (steadying Pt. > 75%)  FIM - Bed/Chair Transfer Bed/Chair Transfer Assistive Devices: Arm rests Bed/Chair Transfer: 5: Chair or W/C > Bed: Supervision (verbal cues/safety issues), 5: Bed > Chair or W/C: Supervision (verbal cues/safety issues), 4: Supine > Sit: Min A (steadying Pt. > 75%/lift 1 leg)  FIM - Locomotion: Wheelchair Distance: 100 Locomotion: Wheelchair: 0: Activity did not occur FIM - Locomotion: Ambulation Locomotion: Ambulation Assistive Devices: Administrator Ambulation/Gait Assistance: 1: +2 Total assist, 4: Min guard (chair follow for safety) Locomotion: Ambulation: 0: Activity did not occur  Comprehension  Comprehension Mode: Auditory Comprehension: 5-Follows basic conversation/direction: With no assist  Expression Expression Mode: Verbal Expression: 5-Expresses basic needs/ideas: With no assist  Social Interaction Social Interaction: 6-Interacts appropriately with others with medication or extra time (anti-anxiety, antidepressant).  Problem Solving Problem Solving Mode: Not assessed Problem Solving: 4-Solves basic 75 - 89% of the time/requires cueing 10 - 24% of the time  Memory Memory: 5-Recognizes or recalls 90% of the time/requires cueing < 10% of the time  Medical Problem List and Plan: 1. Functional deficits secondary to debilitation after CHF exacerbation/respiratory failure/question polyarticular gout. 2.  DVT Prophylaxis/Anticoagulation: Subcutaneous heparin. Monitor platelet counts and any signs of bleeding 3. Pain Management: Lyrica 75 mg daily, Hydrocodone as needed. Monitor with increased mobility 4. Mood/depression. Zoloft 50 mg daily. Provide emotional support 5. Neuropsych: This patient is capable of making decisions on his own behalf. 6. Skin/Wound Care: Routine skin checks 7. Fluids/Electrolytes/Nutrition: Strict I&O with follow-up chemistries 8. Chronic renal insufficiency with baseline creatinine  2.44, currently 2.35 and stabilizing. It was felt elevation possibly due to prednisone therapy as well as in the setting of decompensated CHF and hypotension. Renal service to follow-up 9. 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 No significant improvement with steroids. Negative TEE. Negative anti-histone DNA. Await further studies. Solumedrol completed 10. Acute on chronic diastolic congestive heart failure. Continue to monitor with any signs of fluid overload. Lasix  discontinued 11. Diabetes mellitus with peripheral neuropathy. Monitor closely while on Solu-Medrol. Hemoglobin A1c 7.5. Lantus insulin 18 units daily. Check blood sugars before meals and at bedtime 12. History of CVA with left hemiparesis. Continue Plavix 13.  Elevated PSA pt has had prostate biopsy about 42yr ago, ?f/u with urology as outpt given all the medical issues currently in play LOS  14.   UTI ecoli ESBL on Fosfomycin   (Days) 7 A FACE TO FACE EVALUATION WAS PERFORMED  SWARTZ,ZACHARY T  06/18/2014, 9:39 AM

## 2014-06-18 NOTE — Plan of Care (Signed)
Problem: RH Balance Goal: LTG Patient will maintain dynamic standing with ADLs (OT) LTG: Patient will maintain dynamic standing balance with assist during activities of daily living (OT)  Upgraded based on pt's continued steady progress  Problem: RH Dressing Goal: LTG Patient will perform upper body dressing (OT) LTG Patient will perform upper body dressing with assist, with/without cues (OT).  Upgraded based on pt's continued steady progress  Problem: RH Toileting Goal: LTG Patient will perform toileting w/assist, cues/equip (OT) LTG: Patient will perform toiletiing (clothes management/hygiene) with assist, with/without cues using equipment (OT)  Upgraded based on pt's continued steady progress  Problem: RH Toilet Transfers Goal: LTG Patient will perform toilet transfers w/assist (OT) LTG: Patient will perform toilet transfers with assist, with/without cues using equipment (OT)  Upgraded based on pt's continued steady progress  Problem: RH Tub/Shower Transfers Goal: LTG Patient will perform tub/shower transfers w/assist (OT) LTG: Patient will perform tub/shower transfers with assist, with/without cues using equipment (OT)  Upgraded based on pt's continued steady progress

## 2014-06-18 NOTE — Progress Notes (Signed)
Physical Therapy Session Note  Patient Details  Name: Darrell Richardson MRN: [id number] Date of Birth: 11/24/1940  Today's Date: 06/18/2014 PT Individual Time: 1430-1530 PT Individual Time Calculation (min): 60 min   Short Term Goals: Week 1:  PT Short Term Goal 1 (Week 1): Pt will demonstrate rolling R and L in flat bed without rail req min A.  PT Short Term Goal 2 (Week 1): Pt will demonstrate supine to/from sit transfer req mod A each direction.  PT Short Term Goal 3 (Week 1): Pt will be a 1 person transfer from bed to/from w/c.  PT Short Term Goal 4 (Week 1): Pt will initiate standing during PT session (standing frame or with walker) PT Short Term Goal 5 (Week 1): Pt will self propel manual w/c x 50' req SBA on level surface.   Skilled Therapeutic Interventions/Progress Updates:   Pt received sitting in w/c, agreeable to going outside to practice car transfer to wife's [place] to ensure safe D/C next week.  Performed with therapist x 2 reps with problem solving on where to step first without step and only using running board, however this was deemed unsafe by therapist and provided 6" step for pt to step onto first and pt was able to perform at min A level.  Wife then performed again with pt with min cues on how to assist at hips.  Both returned demonstration very well.  Pt ambulated two bouts outside on uneven surfaces at min A level with RW with cues for upright posture and increased stride lengths.  Pt very fatigued from gait.  Educated pt and wife on how to assist at home with gait.  Pt would be able to ambulate on grassy surface, but educated to work with therapy on this and have chairs near by in case of increased fatigue.  Assisted pt back to unit and ended with seated nustep x 7 mins (2.5 min intervals with focus on maintaining steps per minute at 40's level) at level 3 resistance with BLEs only for overall strengthening and endurance.  Also discussed ramp and entry to home.  Wife states  that there is 60' from car to ramp and then ramp is about 30' long.  Recommend that he walk to ramp and then only half of ramp (has landing) and have chair there to rest before continuing into home.  Pt and wife verbalized understanding.  Had pt sit to mat at 20" height to simulate home chair.  He was able to perform at S level.  Assisted pt back to room and left in w/c with all needs in reach.  RN notified of request for grounds pass.    Therapy Documentation Precautions:  Precautions Precautions: Fall Precaution Comments: WBAT on R leg (knee pain due to gout; also in hands, toes) Restrictions Weight Bearing Restrictions: No   Vital Signs: Therapy Vitals Temp: 97.8 F (36.6 C) Temp Source: Axillary Pulse Rate: 62 Resp: 18 BP: 126/67 mmHg Patient Position (if appropriate): Sitting Oxygen Therapy SpO2: 100 % O2 Device: Not Delivered Pain:Pt with no pain during session.    Locomotion : Ambulation Ambulation/Gait Assistance: 4: Min assist   See FIM for current functional status  Therapy/Group: Individual Therapy  Denice Bors 06/18/2014, 3:43 PM

## 2014-06-18 NOTE — Plan of Care (Signed)
Problem: RH Balance Goal: LTG Patient will maintain dynamic standing balance (PT) LTG: Patient will maintain dynamic standing balance with assistance during mobility activities (PT)  Upgraded due to progress in therapy.   Problem: RH Bed Mobility Goal: LTG Patient will perform bed mobility with assist (PT) LTG: Patient will perform bed mobility with assistance, with/without cues (PT).  Updated due to progress in therapy  Problem: RH Bed to Chair Transfers Goal: LTG Patient will perform bed/chair transfers w/assist (PT) LTG: Patient will perform bed/chair transfers with assistance, with/without cues (PT).  Updated due to progress in therapy  Problem: RH Car Transfers Goal: LTG Patient will perform car transfers with assist (PT) LTG: Patient will perform car transfers with assistance (PT).  Upgraded due to progress in therapy  Problem: RH Furniture Transfers Goal: LTG Patient will perform furniture transfers w/assist (OT/PT LTG: Patient will perform furniture transfers with assistance (OT/PT).  Upgraded due to progress in therapy  Problem: RH Ambulation Goal: LTG Patient will ambulate in controlled environment (PT) LTG: Patient will ambulate in a controlled environment, # of feet with assistance (PT).  Upgraded due to progress in therapy  Problem: RH Wheelchair Mobility Goal: LTG Patient will propel w/c in home environment (PT) LTG: Patient will propel wheelchair in home environment, # of feet with assistance (PT).  Outcome: Not Applicable Date Met:  21/03/12 D/C'd as pt will be ambulatory in the home.

## 2014-06-18 NOTE — Progress Notes (Signed)
Occupational Therapy Session Note  Patient Details  Name: Darrell Richardson MRN: [id number] Date of Birth: 1940-09-07  Today's Date: 06/18/2014 OT Individual Time: 1100-1145 OT Individual Time Calculation (min): 45 min    Short Term Goals: Week 1:  OT Short Term Goal 1 (Week 1): Pt.  will bathe UB EOB with mod assist OT Short Term Goal 2 (Week 1): Pt. will perform lateral leans with mod assist during bathing OT Short Term Goal 3 (Week 1): Pt. will dress UB with SBA for dynamic sitting balance OT Short Term Goal 4 (Week 1): Pt. will don pants with mod assist using AE PRN OT Short Term Goal 5 (Week 1): Pt. will transfer to [facility] with sliding board with mod assist  Skilled Therapeutic Interventions/Progress Updates:  Upon entering the room, pt seated in wheelchair with wife present in room. Pt continues to report feeling unwell but wishes to continue and declines toileting. Pt propelled self to day room ~ 100 feet with supervision and increased time secondary to fatigue. Pt standing at table and dealing cards for card game. Pt standing for a total of 3 minutes with min A. Pt then reporting needing to go to bathroom immediately. Therapist assisted pt back into wheelchair and propelled wheelchair to bathroom. Min A stand pivot transfer onto elevated toilet seat. Steady assist for clothing management and hygiene after BM. Pt having accident in clothing and needing to change pants. Seated in wheelchair washing hands at sink and standing with min A balance to don LB pants. After standing pt reporting needing to toilet again. Therapist assisted pt back to toilet in manner stated above for another BM. After toileting completed pt seated in wheelchair with call bell and all needed items within reach upon exiting the room.   Therapy Documentation Precautions:  Precautions Precautions: Fall Precaution Comments: WBAT on R leg (knee pain due to gout; also in hands, toes) Restrictions Weight Bearing  Restrictions: No Vital Signs: Therapy Vitals Pulse Rate: 81 BP: 124/76 mmHg  See FIM for current functional status  Therapy/Group: Individual Therapy  Phineas Semen 06/18/2014, 11:48 AM

## 2014-06-18 NOTE — Progress Notes (Signed)
Patient Demographics  Darrell Richardson, is a 74 y.o. male, DOB - Nov 01, 1940, WGN:[id number]  Adm joint pain improving joint pain improving, weakness and deconditioning weakness and deconditioningit date - 06/11/2014   Admitting Physician Charlett Blake, MD  Outpatient Primary MD for the patient is [name], MD  LOS - 7   No chief complaint on file.                                                             Consult follow-up note   Subjective:   Darrell Richardson today has, No headache, No chest pain, No abdominal pain - No Nausea, No new weakness tingling or numbness, No Cough - SOB. Joints pain improving.  Assessment & Plan    Active Problems:   Hypertension   CKD (chronic kidney disease), stage IV   Diabetes type 2, uncontrolled   Primary gout   Myalgia   Polyarthralgia   Debilitated   Chronic combined systolic and diastolic heart failure   ESR raised   Elevated PSA   CAD in native artery  Weakness and deconditioning: - This is most likely related to muscle disuse secondary to pain from polyarthralgia. - Management as per CIR team, main goal is to control pain prior to physical therapy sessions. - Significant improvement since admission to CIR.  Polyarthralgia/gout flare - most likely related to gout, as per orthopedic consult. - paraneoplastic workup/autoimmune/infectious workup has been nonrevealing. - Significant improvement of pain after being started on steroids and colchicine . -Pain almost resolved, stopped prednisone - Continue with colchicine, monitor closely given his renal function, (  colchicine was increased to 0.6 mg oral daily 3/8, monitor closely avoid NSAIDs. - Continue with allopurinol  Acute on chronic renal failure - Continues to improve, creatinine is 2.1, patient baseline is 2.5 as an outpatient, patient having significant  lower extremity edema, will increase his Lasix to home dose  80 mg oral daily.  Type 2 diabetesLantus increased to 24 units dailynue with insulin sliding scale  Cardiomyopathy /  chronic diastolic and systolic CHF - Worsening lower extremity edema, will increase Lasix at home dose 80 mg oral daily,   History of CVA - Continue with Plavix  Urinary retention - Foley catheter discontinued, resolved, .  UTI - Cultures growing Escherichia coli, ESBL, stopped Augmentin, treated with fosfomycin 3 g X2.  Elevated PSA - Will need to follow with urology as an outpatient, has been followed by urology Dr. [name] in [place] before 10 years where he had a biopsy, most likely due to elevated PSA level and as well.     Code Status: Full  Family Communication: Wife at bedsid     We will sign off, please reconsult [place] if any new issues or questions arise. - Please check BMP regularly once every 48 hours to the patient is discharged.      Medications  Scheduled Meds: . acidophilus  2 capsule Oral Daily  . allopurinol  100 mg Oral Daily  . carvedilol  3.125 mg Oral BID WC  .  clopidogrel  75 mg Oral Daily  . colchicine  0.6 mg Oral Daily  . [START ON 06/19/2014] furosemide  80 mg Oral Daily  . heparin  5,000 Units Subcutaneous 3 times per day  . insulin aspart  0-20 Units Subcutaneous TID WC  . insulin glargine  24 Units Subcutaneous Daily  . pravastatin  20 mg Oral Daily  . pregabalin  75 mg Oral Daily  . psyllium  1 packet Oral QHS  . sertraline  50 mg Oral QHS   Continuous Infusions:  PRN Meds:.[DISCONTINUED] acetaminophen **OR** acetaminophen, acetaminophen, bisacodyl, HYDROcodone-acetaminophen, hydrocortisone cream, ipratropium-albuterol, methocarbamol, ondansetron **OR** ondansetron (ZOFRAN) IV, polyethylene glycol, sorbitol, traMADol-acetaminophen  DVT Prophylaxis  Heparin  Lab Results  Component Value Date   PLT 278 06/14/2014    Antibiotics    Anti-infectives     Start     Dose/Rate Route Frequency Ordered Stop   06/13/14 0815  amoxicillin-clavulanate (AUGMENTIN) 500-125 MG per tablet 500 mg  Status:  Discontinued    Comments:  MD aware of Rocephin allergy, would like to proceed with treatment   1 tablet Oral Every 12 hours 06/13/14 0802 06/15/14 1333   06/12/14 2000  amoxicillin-clavulanate (AUGMENTIN) 875-125 MG per tablet 1 tablet  Status:  Discontinued     1 tablet Oral Every 12 hours 06/12/14 1823 06/12/14 1826          Objective:   Filed Vitals:   06/18/14 0619 06/18/14 0836 06/18/14 1536 06/18/14 1739  BP: 119/74 124/76 126/67 125/68  Pulse: 78 81 62 64  Temp: 98.4 F (36.9 C)  97.8 F (36.6 C)   TempSrc: Oral  Axillary   Resp: 18  18   Height:      Weight: 98.3 kg (216 lb 11.4 oz)     SpO2: 98%  100%     Wt Readings from Last 3 Encounters:  06/18/14 98.3 kg (216 lb 11.4 oz)  06/11/14 103.874 kg (229 lb)  05/01/13 102.059 kg (225 lb)     Intake/Output Summary (Last 24 hours) at 06/18/14 1842 Last data filed at 06/18/14 1800  Gross per 24 hour  Intake    900 ml  Output   1726 ml  Net   -826 ml     Physical Exam  Awake Alert, Oriented X 3, No new F.N deficits, Normal affect [place].AT,PERRAL Supple Neck,No JVD, No cervical lymphadenopathy appriciated.  Symmetrical Chest wall movement, Good air movement bilaterally, CTAB RRR,No Gallops,Rubs or new Murmurs, No Parasternal Heave +ve B.Sounds, Abd Soft, No tenderness, No organomegaly appriciated, No rebound - guarding or rigidity. No Cyanosis, Clubbing , +2 bilateral lower extremity edema, No new Rash or bruise  , no pain at the ankle. Data Review   Micro Results Recent Results (from the past 240 hour(s))  Urine culture     Status: None   Collection Time: 06/12/14 11:22 AM  Result Value Ref Range Status   Specimen Description URINE, CATHETERIZED  Final   Special Requests NONE  Final   Colony Count   Final    >=100,000 COLONIES/ML Performed at [facility]      Culture   Final    ESCHERICHIA COLI Note: Confirmed Extended Spectrum Beta-Lactamase Producer (ESBL) CRITICAL RESULT CALLED TO, READ BACK BY AND VERIFIED WITH: [facility] 3/8 AT 49 BY DUNNJ Performed at [facility]    Report Status 06/15/2014 FINAL  Final   Organism ID, Bacteria ESCHERICHIA COLI  Final      Susceptibility   Escherichia coli -  MIC*    AMPICILLIN >=32 RESISTANT Resistant     CEFAZOLIN >=64 RESISTANT Resistant     CEFTRIAXONE >=64 RESISTANT Resistant     CIPROFLOXACIN >=4 RESISTANT Resistant     GENTAMICIN <=1 SENSITIVE Sensitive     LEVOFLOXACIN >=8 RESISTANT Resistant     NITROFURANTOIN <=16 SENSITIVE Sensitive     TOBRAMYCIN >=16 RESISTANT Resistant     TRIMETH/SULFA >=320 RESISTANT Resistant     IMIPENEM <=0.25 SENSITIVE Sensitive     PIP/TAZO 8 SENSITIVE Sensitive     * ESCHERICHIA COLI    Radiology Reports No results found.  CBC  Recent Labs Lab 06/14/14 0622  WBC 9.8  HGB 11.4*  HCT 33.8*  PLT 278  MCV 84.9  MCH 28.6  MCHC 33.7  RDW 12.9  LYMPHSABS 1.0  MONOABS 0.8  EOSABS 0.1  BASOSABS 0.0    Chemistries   Recent Labs Lab 06/11/14 2004 06/12/14 1610 06/14/14 0622 06/16/14 0547 06/18/14 0643  NA  --  134* 135 134* 136  K  --  4.8 4.6 4.8 4.8  CL  --  101 104 103 103  CO2  --  28 25 25 25   GLUCOSE  --  176* 112* 103* 102*  BUN  --  111* 92* 74* 55*  CREATININE 2.98* 2.71* 2.35* 2.26* 2.12*  CALCIUM  --  7.9* 7.9* 8.0* 7.9*  AST  --   --  16  --   --   ALT  --   --  26  --   --   ALKPHOS  --   --  67  --   --   BILITOT  --   --  0.6  --   --    ------------------------------------------------------------------------------------------------------------------ estimated creatinine clearance is 35.3 mL/min (by C-G formula based on Cr of 2.12). ------------------------------------------------------------------------------------------------------------------ No results for input(s): HGBA1C in the last 72  hours. ------------------------------------------------------------------------------------------------------------------ No results for input(s): CHOL, HDL, LDLCALC, TRIG, CHOLHDL, LDLDIRECT in the last 72 hours. ------------------------------------------------------------------------------------------------------------------ No results for input(s): TSH, T4TOTAL, T3FREE, THYROIDAB in the last 72 hours.  Invalid input(s): FREET3 ------------------------------------------------------------------------------------------------------------------ No results for input(s): VITAMINB12, FOLATE, FERRITIN, TIBC, IRON, RETICCTPCT in the last 72 hours.  Coagulation profile No results for input(s): INR, PROTIME in the last 168 hours.  No results for input(s): DDIMER in the last 72 hours.  Cardiac Enzymes No results for input(s): CKMB, TROPONINI, MYOGLOBIN in the last 168 hours.  Invalid input(s): CK ------------------------------------------------------------------------------------------------------------------ Invalid input(s): POCBNP     Time Spent in minutes   30 minutes  ELGERGAWY, DAWOOD M.D on 06/18/2014 at 6:42 PM  Between 7am to 7pm - Pager - [phone]  After 7pm go to [email or web address] - password [email or web address]  And look for the night coverage person covering for me after hours  [facility] Office  [phone]   **Disclaimer: This note may have been dictated with voice recognition software. Similar sounding words can inadvertently be transcribed and this note may contain transcription errors which may not have been corrected upon publication of note.**

## 2014-06-19 ENCOUNTER — Inpatient Hospital Stay (HOSPITAL_COMMUNITY): Payer: Medicare Other | Admitting: Occupational Therapy

## 2014-06-19 ENCOUNTER — Inpatient Hospital Stay (HOSPITAL_COMMUNITY): Payer: Medicare Other | Admitting: Physical Therapy

## 2014-06-19 LAB — GLUCOSE, CAPILLARY
GLUCOSE-CAPILLARY: 86 mg/dL (ref 70–99)
GLUCOSE-CAPILLARY: 101 mg/dL — AB (ref 70–99)
GLUCOSE-CAPILLARY: 155 mg/dL — AB (ref 70–99)
Glucose-Capillary: 102 mg/dL — ABNORMAL HIGH (ref 70–99)

## 2014-06-19 NOTE — Progress Notes (Signed)
Physical Therapy Session Note  Patient Details  Name: Darrell Richardson MRN: [id number] Date of Birth: 1941/04/02  Today's Date: 06/19/2014 PT Individual Time: 1300-1400 PT Individual Time Calculation (min): 60 min   Short Term Goals: Week 1:  PT Short Term Goal 1 (Week 1): Pt will demonstrate rolling R and L in flat bed without rail req min A.  PT Short Term Goal 2 (Week 1): Pt will demonstrate supine to/from sit transfer req mod A each direction.  PT Short Term Goal 3 (Week 1): Pt will be a 1 person transfer from bed to/from w/c.  PT Short Term Goal 4 (Week 1): Pt will initiate standing during PT session (standing frame or with walker) PT Short Term Goal 5 (Week 1): Pt will self propel manual w/c x 50' req SBA on level surface.   Skilled Therapeutic Interventions/Progress Updates:  Pt was seen bedside in the pm. Pt propelled w/c to gym with B UEs and S about 150 feet. Pt performed multiple sit to stand transfers with rolling walker and S to min guard with occasional verbal cues. Performed cone taps and criss cross cone taps for LE strengthening. Pt ambulated about 170 feet with rolling walker and min guard to S. Pt propelled w/c about 100 feet with B UEs and S.   Therapy Documentation Precautions:  Precautions Precautions: Fall Precaution Comments: WBAT on R leg (knee pain due to gout; also in hands, toes) Restrictions Weight Bearing Restrictions: No General:   Pain: No c/o pain.    Locomotion : Ambulation Ambulation/Gait Assistance: 4: Min guard;5: Supervision   See FIM for current functional status  Therapy/Group: Individual Therapy  Dub Amis 06/19/2014, 3:26 PM

## 2014-06-19 NOTE — Progress Notes (Signed)
Patient ID: Darrell Richardson, male   DOB: 1940/05/08, 74 y.o.   MRN: [id number]  06/19/14.  Subjective/Complaints:  74 year old admitted for CIR with functional deficits secondary to debilitation after CHF exacerbation/respiratory failure/question polyarticular gout. Had a good night last night and no complaints today.  Enjoyed his outdoor past yesterday  Past Medical History  Diagnosis Date  . Hearing loss   . Gout   . Diabetes   . Fatigue   . Hypertension   . Stroke     X 2  . Heart failure   . Muscle pain   . Hammertoe      Intake/Output Summary (Last 24 hours) at 06/19/14 0935 Last data filed at 06/19/14 0800  Gross per 24 hour  Intake   1140 ml  Output   1201 ml  Net    -61 ml    Patient Vitals for the past 24 hrs:  BP Temp Temp src Pulse Resp SpO2 Weight  06/19/14 0624 (!) 141/62 mmHg 97.5 F (36.4 C) Oral (!) 59 18 99 % -  06/19/14 0500 - - - - - - 214 lb 1.1 oz (97.1 kg)  06/18/14 1924 - - - - - - 218 lb 7.6 oz (99.1 kg)  06/18/14 1739 125/68 mmHg - - 64 - - -  06/18/14 1536 126/67 mmHg 97.8 F (36.6 C) Axillary 62 18 100 % -    Objective: Vital Signs: Blood pressure 141/62, pulse 59, temperature 97.5 F (36.4 C), temperature source Oral, resp. rate 18, height _0  (1.727 m), weight 214 lb 1.1 oz (97.1 kg), SpO2 99 %. No results found. Results for orders placed or performed during the hospital encounter of 06/11/14 (from the past 72 hour(s))  Glucose, capillary     Status: Abnormal   Collection Time: 06/16/14 11:38 AM  Result Value Ref Range   Glucose-Capillary 173 (H) 70 - 99 mg/dL   Comment 1 Notify RN   Glucose, capillary     Status: Abnormal   Collection Time: 06/16/14  4:58 PM  Result Value Ref Range   Glucose-Capillary 199 (H) 70 - 99 mg/dL  Glucose, capillary     Status: Abnormal   Collection Time: 06/16/14  8:51 PM  Result Value Ref Range   Glucose-Capillary 236 (H) 70 - 99 mg/dL   Comment 1 Notify RN   Glucose, capillary     Status: None   Collection Time: 06/17/14  6:34 AM  Result Value Ref Range   Glucose-Capillary 99 70 - 99 mg/dL   Comment 1 Notify RN   Glucose, capillary     Status: Abnormal   Collection Time: 06/17/14 11:40 AM  Result Value Ref Range   Glucose-Capillary 170 (H) 70 - 99 mg/dL  Glucose, capillary     Status: Abnormal   Collection Time: 06/17/14  4:43 PM  Result Value Ref Range   Glucose-Capillary 160 (H) 70 - 99 mg/dL  Glucose, capillary     Status: Abnormal   Collection Time: 06/17/14  9:25 PM  Result Value Ref Range   Glucose-Capillary 135 (H) 70 - 99 mg/dL   Comment 1 Notify RN   Basic metabolic panel     Status: Abnormal   Collection Time: 06/18/14  6:43 AM  Result Value Ref Range   Sodium 136 135 - 145 mmol/L   Potassium 4.8 3.5 - 5.1 mmol/L   Chloride 103 96 - 112 mmol/L   CO2 25 19 - 32 mmol/L   Glucose, Bld 102 (H) 70 -  99 mg/dL   BUN 55 (H) 6 - 23 mg/dL   Creatinine, Ser 2.12 (H) 0.50 - 1.35 mg/dL   Calcium 7.9 (L) 8.4 - 10.5 mg/dL   GFR calc non Af Amer 29 (L) >90 mL/min   GFR calc Af Amer 34 (L) >90 mL/min    Comment: (NOTE) The eGFR has been calculated using the CKD EPI equation. This calculation has not been validated in all clinical situations. eGFR's persistently <90 mL/min signify possible Chronic Kidney Disease.    Anion gap 8 5 - 15  Glucose, capillary     Status: None   Collection Time: 06/18/14  6:52 AM  Result Value Ref Range   Glucose-Capillary 84 70 - 99 mg/dL   Comment 1 Notify RN   Glucose, capillary     Status: Abnormal   Collection Time: 06/18/14 12:11 PM  Result Value Ref Range   Glucose-Capillary 123 (H) 70 - 99 mg/dL   Comment 1 Notify RN   Glucose, capillary     Status: Abnormal   Collection Time: 06/18/14  4:41 PM  Result Value Ref Range   Glucose-Capillary 133 (H) 70 - 99 mg/dL   Comment 1 Notify RN   Glucose, capillary     Status: None   Collection Time: 06/18/14  8:52 PM  Result Value Ref Range   Glucose-Capillary 87 70 - 99 mg/dL   Comment  1 Notify RN   Glucose, capillary     Status: None   Collection Time: 06/19/14  6:33 AM  Result Value Ref Range   Glucose-Capillary 86 70 - 99 mg/dL   Comment 1 Notify RN     Lab Results  Component Value Date   HGBA1C 7.5* 05/31/2014    HEENT: normal Cardio: RRR and no murmur Resp: CTA B/L and unlabored GI: BS positive and NT, ND Extremity:  Edema scrotal and pretibial edema; pedal edema, left foot greater than right Skin:   Other erythema around scrotum Neuro: Alert/Oriented, Anxious, Normal Sensory and Abnormal Motor 4/5 bilateral delt bi tri grip, 3- bilateral HF, KE, ADF APF Musc/Skel:  Other no lower ext pain with ROM, edema at ankles,  mild erythema in feet Gen NAD  Obese   Assessment/Plan: 1. 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, polyarticular gout 2.  DVT Prophylaxis/Anticoagulation: Subcutaneous heparin. Monitor platelet counts and any signs of bleeding 3. Pain Management: Lyrica 75 mg daily, Hydrocodone as needed. Monitor with increased mobility 4. Mood/depression. Zoloft 50 mg daily. Provide emotional support 5. Neuropsych: This patient is capable of making decisions on his own behalf. 6. Skin/Wound Care: Routine skin checks 7. Fluids/Electrolytes/Nutrition: Strict I&O with follow-up chemistries 8. Chronic renal insufficiency with baseline creatinine 2.44, currently 2.35 and stabilizing. It was felt elevation possibly due to prednisone therapy as well as in the setting of decompensated CHF and hypotension. Renal service to follow-up 9. 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 No significant improvement with steroids. Negative TEE. Negative anti-histone DNA. Await further studies. Solumedrol completed 10. Acute on chronic diastolic congestive heart failure. Continue to monitor with any signs of fluid overload. Lasix  discontinued 11. Diabetes mellitus with peripheral neuropathy. Monitor closely while on Solu-Medrol. Hemoglobin A1c 7.5.  Lantus insulin 18 units daily. Check blood sugars before meals and at bedtime 12. History of CVA with left hemiparesis. Continue Plavix 13.  Elevated PSA pt has had prostate biopsy about 6yr ago, ?f/u with urology as outpt given all the medical issues currently in play LOS  14.   UTI ecoli ESBL  on Fosfomycin   (Days) 8 A FACE TO FACE EVALUATION WAS PERFORMED  Nyoka Cowden  06/19/2014, 9:34 AM

## 2014-06-19 NOTE — Progress Notes (Signed)
Occupational Therapy Session Note  Patient Details  Name: Darrell Richardson MRN: [id number] Date of Birth: [birth year]  Today's Date: 06/19/2014 OT Individual Time: 1000-1100 and 1400-1430 OT Individual Time Calculation (min): 60 min and 30 min   Short Term Goals: Week 1:  OT Short Term Goal 1 (Week 1): Pt.  will bathe UB EOB with mod assist OT Short Term Goal 2 (Week 1): Pt. will perform lateral leans with mod assist during bathing OT Short Term Goal 3 (Week 1): Pt. will dress UB with SBA for dynamic sitting balance OT Short Term Goal 4 (Week 1): Pt. will don pants with mod assist using AE PRN OT Short Term Goal 5 (Week 1): Pt. will transfer to [facility] with sliding board with mod assist  Skilled Therapeutic Interventions/Progress Updates:  Session 1: Upon entering the room, pt seated in wheelchair with wife present in room. Pt with c/o 7/10 pain in L foot during session but very motivated to participate in therapy session. Skilled OT intervention with focus on STS, functional mobility, functional transfers, self care retraining, and dynamic standing balance. Pt ambulated with RW and Min A into bathroom to sit onto TTB with use of grab bar. Bathing performed at shower level with steady assist and use of LH sponge to wash B feet and lower legs. Dressing completed at sink side with STS from wheelchair. STS with steady assist as well as for LB clothing management. Pt ambulating ~ 150 feet with RW and supervision - steady assist as pt began to fatigue. Pt pausing once secondary to fatigue but not sitting down. Pt returning to room and seated in wheelchair. Call bell within reach and wife present in room.    Session 2: Pt transitioning easily from PT session and was intercepted in the hallway while with therapist. Pt continued to propel self in wheelchair to day room ~ 75 feet with B UEs. Pt reporting fatigue with tasks and requesting rest break. Pt standing 2 bouts of 7 minutes and then 4 minutes to play UNO  card game with wife for standing endurance. Pt with increasing discomfort secondary to pain in L foot when standing. OT assisted pt back to room via total A in wheelchair. Min A stand pivot bed >wheelchair. Sit >supine with supervision and therapist elevated feet secondary to edema present. Wife present in room and call bell within reach upon exiting.   Therapy Documentation Precautions:  Precautions Precautions: Fall Precaution Comments: WBAT on R leg (knee pain due to gout; also in hands, toes) Restrictions Weight Bearing Restrictions: No Pain: Pain Assessment Pain Assessment: No/denies pain  See FIM for current functional status  Therapy/Group: Individual Therapy  Phineas Semen 06/19/2014, 12:45 PM

## 2014-06-20 ENCOUNTER — Inpatient Hospital Stay (HOSPITAL_COMMUNITY): Payer: Medicare Other | Admitting: Occupational Therapy

## 2014-06-20 ENCOUNTER — Inpatient Hospital Stay (HOSPITAL_COMMUNITY): Payer: Medicare Other | Admitting: Physical Therapy

## 2014-06-20 DIAGNOSIS — I5033 Acute on chronic diastolic (congestive) heart failure: Secondary | ICD-10-CM

## 2014-06-20 LAB — GLUCOSE, CAPILLARY
GLUCOSE-CAPILLARY: 179 mg/dL — AB (ref 70–99)
GLUCOSE-CAPILLARY: 67 mg/dL — AB (ref 70–99)
GLUCOSE-CAPILLARY: 96 mg/dL (ref 70–99)
Glucose-Capillary: 88 mg/dL (ref 70–99)
Glucose-Capillary: 104 mg/dL — ABNORMAL HIGH (ref 70–99)

## 2014-06-20 MED ORDER — PREGABALIN 50 MG PO CAPS
ORAL_CAPSULE | ORAL | Status: AC
  Filled 2014-06-20: qty 1

## 2014-06-20 MED ORDER — INSULIN GLARGINE 100 UNIT/ML ~~LOC~~ SOLN
20.0000 [IU] | Freq: Every day | SUBCUTANEOUS | Status: DC
  Administered 2014-06-21 – 2014-06-22 (×2): 20 [IU] via SUBCUTANEOUS
  Filled 2014-06-20 (×2): qty 0.2

## 2014-06-20 NOTE — Progress Notes (Signed)
Physical Therapy Session Note  Patient Details  Name: Darrell Richardson MRN: [id number] Date of Birth: 1940-12-04  Today's Date: 06/20/2014 PT Individual Time: 1300-1400 PT Individual Time Calculation (min): 60 min   Short Term Goals: Week 1:  PT Short Term Goal 1 (Week 1): Pt will demonstrate rolling R and L in flat bed without rail req min A.  PT Short Term Goal 2 (Week 1): Pt will demonstrate supine to/from sit transfer req mod A each direction.  PT Short Term Goal 3 (Week 1): Pt will be a 1 person transfer from bed to/from w/c.  PT Short Term Goal 4 (Week 1): Pt will initiate standing during PT session (standing frame or with walker) PT Short Term Goal 5 (Week 1): Pt will self propel manual w/c x 50' req SBA on level surface.   Skilled Therapeutic Interventions/Progress Updates:  Pt was seen bedside in the pm. Pt transferred edge of bed to w/c with S. Pt propelled w/c to gym with B UEs and S. Pt performed multiple sit to stand transfers with S and rolling walker. Pt performed cone taps and criss cross cone taps for LE strengthening. Pt ambulated with rolling walker and S to min guard about 120 feet. Pt ambulated with 4WW and S to min guard for about 120 feet. Pt propelled w/c back towards room with B UEs and S about 100 feet.   Therapy Documentation Precautions:  Precautions Precautions: Fall Precaution Comments: WBAT on R leg (knee pain due to gout; also in hands, toes) Restrictions Weight Bearing Restrictions: No General:   Pain: No c/o pain.    Locomotion : Ambulation Ambulation/Gait Assistance: 4: Min guard;5: Supervision   See FIM for current functional status  Therapy/Group: Individual Therapy  Dub Amis 06/20/2014, 2:46 PM

## 2014-06-20 NOTE — Progress Notes (Signed)
Patient ID: Darrell Richardson, male   DOB: September 18, 1940, 74 y.o.   MRN: [id number]   Patient ID: Darrell Richardson, male   DOB: [birth year], 74 y.o.   MRN: [id number]  06/20/14.  Subjective/Complaints:  74 year old admitted for CIR with functional deficits secondary to debilitation after CHF exacerbation/respiratory failure/question polyarticular gout. Had a good night last night and no complaints today except for pedal edema.  No dyspnea or orthopnea  Past Medical History  Diagnosis Date  . Hearing loss   . Gout   . Diabetes   . Fatigue   . Hypertension   . Stroke     X 2  . Heart failure   . Muscle pain   . Hammertoe      Intake/Output Summary (Last 24 hours) at 06/20/14 0843 Last data filed at 06/20/14 0500  Gross per 24 hour  Intake    960 ml  Output   2350 ml  Net  -1390 ml    Patient Vitals for the past 24 hrs:  BP Temp Temp src Pulse Resp SpO2 Weight  06/20/14 0519 (!) 144/57 mmHg - - (!) 57 16 98 % 216 lb 11.4 oz (98.3 kg)  06/19/14 1528 (!) 135/56 mmHg 97.9 F (36.6 C) Oral 62 18 100 % -   CBG (last 3)   Recent Labs  06/19/14 1624 06/19/14 2114 06/20/14 0653  GLUCAP 155* 102* 88    Objective: Vital Signs: Blood pressure 144/57, pulse 57, temperature 97.9 F (36.6 C), temperature source Oral, resp. rate 16, height 5' 8"  (1.727 m), weight 216 lb 11.4 oz (98.3 kg), SpO2 98 %. No results found. Results for orders placed or performed during the hospital encounter of 06/11/14 (from the past 72 hour(s))  Glucose, capillary     Status: Abnormal   Collection Time: 06/17/14 11:40 AM  Result Value Ref Range   Glucose-Capillary 170 (H) 70 - 99 mg/dL  Glucose, capillary     Status: Abnormal   Collection Time: 06/17/14  4:43 PM  Result Value Ref Range   Glucose-Capillary 160 (H) 70 - 99 mg/dL  Glucose, capillary     Status: Abnormal   Collection Time: 06/17/14  9:25 PM  Result Value Ref Range   Glucose-Capillary 135 (H) 70 - 99 mg/dL   Comment 1 Notify RN   Basic  metabolic panel     Status: Abnormal   Collection Time: 06/18/14  6:43 AM  Result Value Ref Range   Sodium 136 135 - 145 mmol/L   Potassium 4.8 3.5 - 5.1 mmol/L   Chloride 103 96 - 112 mmol/L   CO2 25 19 - 32 mmol/L   Glucose, Bld 102 (H) 70 - 99 mg/dL   BUN 55 (H) 6 - 23 mg/dL   Creatinine, Ser 2.12 (H) 0.50 - 1.35 mg/dL   Calcium 7.9 (L) 8.4 - 10.5 mg/dL   GFR calc non Af Amer 29 (L) >90 mL/min   GFR calc Af Amer 34 (L) >90 mL/min    Comment: (NOTE) The eGFR has been calculated using the CKD EPI equation. This calculation has not been validated in all clinical situations. eGFR's persistently <90 mL/min signify possible Chronic Kidney Disease.    Anion gap 8 5 - 15  Glucose, capillary     Status: None   Collection Time: 06/18/14  6:52 AM  Result Value Ref Range   Glucose-Capillary 84 70 - 99 mg/dL   Comment 1 Notify RN   Glucose, capillary     Status:  Abnormal   Collection Time: 06/18/14 12:11 PM  Result Value Ref Range   Glucose-Capillary 123 (H) 70 - 99 mg/dL   Comment 1 Notify RN   Glucose, capillary     Status: Abnormal   Collection Time: 06/18/14  4:41 PM  Result Value Ref Range   Glucose-Capillary 133 (H) 70 - 99 mg/dL   Comment 1 Notify RN   Glucose, capillary     Status: None   Collection Time: 06/18/14  8:52 PM  Result Value Ref Range   Glucose-Capillary 87 70 - 99 mg/dL   Comment 1 Notify RN   Glucose, capillary     Status: None   Collection Time: 06/19/14  6:33 AM  Result Value Ref Range   Glucose-Capillary 86 70 - 99 mg/dL   Comment 1 Notify RN   Glucose, capillary     Status: Abnormal   Collection Time: 06/19/14 11:36 AM  Result Value Ref Range   Glucose-Capillary 101 (H) 70 - 99 mg/dL  Glucose, capillary     Status: Abnormal   Collection Time: 06/19/14  4:24 PM  Result Value Ref Range   Glucose-Capillary 155 (H) 70 - 99 mg/dL  Glucose, capillary     Status: Abnormal   Collection Time: 06/19/14  9:14 PM  Result Value Ref Range   Glucose-Capillary  102 (H) 70 - 99 mg/dL   Comment 1 Notify RN   Glucose, capillary     Status: None   Collection Time: 06/20/14  6:53 AM  Result Value Ref Range   Glucose-Capillary 88 70 - 99 mg/dL   Comment 1 Notify RN     Lab Results  Component Value Date   HGBA1C 7.5* 05/31/2014    HEENT: normal Cardio: RRR and no murmur Resp: CTA B/L and unlabored; chest remains clear  GI: BS positive and NT, ND Extremitprominent pedal edemain:   Other erythema around scrotum Neuro: Alert/Oriented, Anxious, Normal Sensory and Abnormal Motor 4/5 bilateral delt bi tri grip, 3- bilateral HF, KE, ADF APF Musc/Skel:  Other no lower ext pain with ROM, edema at ankles,  mild erythema in feet Gen NAD  Obese   Assessment/Plan: 1. 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, polyarticular gout 2.  DVT Prophylaxis/Anticoagulation: Subcutaneous heparin. Monitor platelet counts and any signs of bleeding 3. Pain Management: Lyrica 75 mg daily, Hydrocodone as needed. Monitor with increased mobility 4. Mood/depression. Zoloft 50 mg daily. Provide emotional support 5. Neuropsych: This patient is capable of making decisions on his own behalf. 6. Skin/Wound Care: Routine skin checks 7. Fluids/Electrolytes/Nutrition: Strict I&O with follow-up chemistries 8. Chronic renal insufficiency with baseline creatinine 2.44, currently 2.35 and stabilizing. It was felt elevation possibly due to prednisone therapy as well as in the setting of decompensated CHF and hypotension. Renal service to follow-up 9. 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 No significant improvement with steroids. Negative TEE. Negative anti-histone DNA. Await further studies. Solumedrol completed 10. Acute on chronic diastolic congestive heart failure. Continue to monitor with any signs of fluid overload. Lasix  discontinued 11. Diabetes mellitus with peripheral neuropathy. Hemoglobin A1c 7.5. Lantus insulin 18 units daily. Check blood sugars  before meals and at bedtime 12. History of CVA with left hemiparesis. Continue Plavix 13.  Elevated PSA pt has had prostate biopsy about 40yr ago, ?f/u with urology as outpt given all the medical issues currently in play LOS  14.   UTI ecoli ESBL on Fosfomycin   (Days) 9 A FACE TO FACE EVALUATION WAS PERFORMED  KNyoka Cowden  06/20/2014, 8:43 AM

## 2014-06-20 NOTE — Progress Notes (Signed)
Occupational Therapy Session Note  Patient Details  Name: Darrell Richardson MRN: [id number] Date of Birth: 12-10-1940  Today's Date: 06/20/2014 OT Individual Time: 7001-7494 and 1030-1100 OT Individual Time Calculation (min): 60 min and 30 min   Short Term Goals: Week 1:  OT Short Term Goal 1 (Week 1): Pt.  will bathe UB EOB with mod assist OT Short Term Goal 2 (Week 1): Pt. will perform lateral leans with mod assist during bathing OT Short Term Goal 3 (Week 1): Pt. will dress UB with SBA for dynamic sitting balance OT Short Term Goal 4 (Week 1): Pt. will don pants with mod assist using AE PRN OT Short Term Goal 5 (Week 1): Pt. will transfer to [facility] with sliding board with mod assist  Skilled Therapeutic Interventions/Progress Updates:    Session 1: Upon entering the room, pt seated on toilet with wife present in room. Pt reporting 7/10 pain in L foot but agreeable to participate in session. Wife present to begin family training pending upcoming discharge date. Pt required close supervision for clothing management and hygiene. Wife provided steady assist for pt to sit onto TTB with use of RW and grab bar. Bathing completed at shower level with supervision for safety. Pt utilized LH sponge to wash B lower legs and feet. Caregiver assisted pt out of bathroom to sit on wheelchair for dressing. She provided good verbal cues for safety awareness such as , "Don't forget to lock your breaks." Pt required steady assist for balance during LB clothing management. Grooming performed at sink with Mod I. Pt then ambulated ~ 150 feet in hallway with wife providing close supervision for safety. Pt returning to room and sitting in wheelchair. OT educated wife on recommendation for 1 non slip bath mat outside of shower door to decrease fall risk. OT also encouraged purse lips breathing during session as pt observed to be "panting" while mouth breathing throughout session. Pt seated in wheelchair with call bell and all  needed items within reach.    Session 2: Upon entering the room, pt seated in wheelchair awaiting therapist arrival. OT educated and demonstrated B UE strengthening exercises. Pt returned demonstration with use of level 2 orange resistive theraband for bicep curls, chest pulls, and alternating punches x 3 sets of 15. Pt requiring frequent rest breaks in between sets secondary to fatigue. Pt requesting to return to bed after completing therapeutic exercises. Pt required steady assist for stand pivot from wheelchair to bed. Close supervision for sit> supine. Bed alarm activated and call bell within reach upon exiting the room.   Therapy Documentation Precautions:  Precautions Precautions: Fall Precaution Comments: WBAT on R leg (knee pain due to gout; also in hands, toes) Restrictions Weight Bearing Restrictions: No Vital Signs: Therapy Vitals Pulse Rate: (!) 57 Resp: 16 BP: (!) 144/57 mmHg Patient Position (if appropriate): Lying Oxygen Therapy SpO2: 98 % O2 Device: Not Delivered Pain: Pain Assessment Pain Score: Asleep  See FIM for current functional status  Therapy/Group: Individual Therapy  Phineas Semen 06/20/2014, 8:58 AM

## 2014-06-21 ENCOUNTER — Inpatient Hospital Stay (HOSPITAL_COMMUNITY): Payer: Medicare Other | Admitting: Rehabilitation

## 2014-06-21 ENCOUNTER — Inpatient Hospital Stay (HOSPITAL_COMMUNITY): Payer: Medicare Other

## 2014-06-21 LAB — GLUCOSE, CAPILLARY
GLUCOSE-CAPILLARY: 138 mg/dL — AB (ref 70–99)
Glucose-Capillary: 116 mg/dL — ABNORMAL HIGH (ref 70–99)
Glucose-Capillary: 107 mg/dL — ABNORMAL HIGH (ref 70–99)
Glucose-Capillary: 172 mg/dL — ABNORMAL HIGH (ref 70–99)

## 2014-06-21 MED ORDER — MENTHOL 3 MG MT LOZG
1.0000 | LOZENGE | OROMUCOSAL | Status: DC | PRN
  Filled 2014-06-21: qty 9

## 2014-06-21 MED ORDER — FUROSEMIDE 40 MG PO TABS: 40.0000 mg | ORAL_TABLET | Freq: Every day | ORAL | Status: DC

## 2014-06-21 NOTE — Progress Notes (Signed)
Physical Therapy Session Note  Patient Details  Name: Darrell Richardson MRN: [id number] Date of Birth: 1940/05/21  Today's Date: 06/21/2014 PT Individual Time: 1330-1430 PT Individual Time Calculation (min): 60 min   Short Term Goals: Week 1:  PT Short Term Goal 1 (Week 1): Pt will demonstrate rolling R and L in flat bed without rail req min A.  PT Short Term Goal 2 (Week 1): Pt will demonstrate supine to/from sit transfer req mod A each direction.  PT Short Term Goal 3 (Week 1): Pt will be a 1 person transfer from bed to/from w/c.  PT Short Term Goal 4 (Week 1): Pt will initiate standing during PT session (standing frame or with walker) PT Short Term Goal 5 (Week 1): Pt will self propel manual w/c x 50' req SBA on level surface.   Skilled Therapeutic Interventions/Progress Updates:   Pt received sitting in recliner in room, agreeable to therapy session.  Skilled session focused on gait on outdoor surfaces, uneven surfaces, grassy areas, up/down inclines and gait through gift shop for tight spaces and to increase distance for endurance.   Pt donned shoes prior to leaving room in order to get used to wearing shoes again.  Assisted to/from downstairs via w/c at total A level for energy conservation.  Once outside performed 4 bouts of gait up to 50' at a time on uneven sidewalks, down/up hills, into grassy areas all with RW at min/guard to close S level.  Pt utilized safe technique with RW with min cues for upright posture throughout.  Pt also negotiated up/down curb at min/guard level with min cues for sequencing.  Ended downstairs activity with gait through gift shop (full loop around) without seated rest break at S level.  Pt able to negotiate tight spaces and around people very well.  Assisted back to unit and performed seated nustep x 5 mins at level 4 resistance with BLEs/UEs in order to increase overall activity tolerance and strengthening.  Pt assisted back to room.  Wife to assist back to  recliner.  All needs in reach.    Therapy Documentation Precautions:  Precautions Precautions: Fall Precaution Comments: WBAT on R leg (knee pain due to gout; also in hands, toes) Restrictions Weight Bearing Restrictions: No   Vital Signs: Therapy Vitals Temp: 97.6 F (36.4 C) Temp Source: Oral Pulse Rate: 70 Resp: 18 BP: 133/66 mmHg Patient Position (if appropriate): Sitting Oxygen Therapy SpO2: 99 % O2 Device: Not Delivered Pain:Pt with no pain during session.    Locomotion : Ambulation Ambulation/Gait Assistance: 5: Supervision;4: Min guard   See FIM for current functional status  Therapy/Group: Individual Therapy  Denice Bors 06/21/2014, 4:03 PM

## 2014-06-21 NOTE — Progress Notes (Signed)
Physical Therapy Session Note  Patient Details  Name: Darrell Richardson MRN: [id number] Date of Birth: 25-Nov-1940  Today's Date: 06/21/2014 PT Individual Time: 1015-1100 PT Individual Time Calculation (min): 45 min   Short Term Goals: Week 1:  PT Short Term Goal 1 (Week 1): Pt will demonstrate rolling R and L in flat bed without rail req min A.  PT Short Term Goal 2 (Week 1): Pt will demonstrate supine to/from sit transfer req mod A each direction.  PT Short Term Goal 3 (Week 1): Pt will be a 1 person transfer from bed to/from w/c.  PT Short Term Goal 4 (Week 1): Pt will initiate standing during PT session (standing frame or with walker) PT Short Term Goal 5 (Week 1): Pt will self propel manual w/c x 50' req SBA on level surface.   Skilled Therapeutic Interventions/Progress Updates:   Pt received sitting in w/c in room, agreeable to therapy.  Skilled therapy session focused on gait to/from therapy gym x 125' x 2 with RW at min/guard to close S level with continued cues for upright posture and maintaining closeness to RW for safety, OTAGO HEP and stair negotiation for strengthening/endurance.  Performed OTAGO HEP x 10 reps BLE as follows, seated LAQ, standing hip abd, standing knee flex, mini squats, heel/toe raises.  Educated wife and pt on how often to perform, how to make harder, and safety concerns with having chair behind him for endurance purposes.   Ended session with stair negotiation.  Navigated up/down 8, 3" steps with B handrails at S level with seated rest break at the top.  Ambulated back to room and left in recliner with feet elevated for edema management.  All needs in reach.  Wife present during session as well to observe.    Therapy Documentation Precautions:  Precautions Precautions: Fall Precaution Comments: WBAT on R leg (knee pain due to gout; also in hands, toes) Restrictions Weight Bearing Restrictions: No   Vital Signs: Therapy Vitals Pulse Rate: 64 BP: (!) 141/68  mmHg Pain: Pain Assessment Pain Assessment: No/denies pain   Locomotion : Ambulation Ambulation/Gait Assistance: 5: Supervision;4: Min guard   See FIM for current functional status  Therapy/Group: Individual Therapy  Denice Bors 06/21/2014, 12:15 PM

## 2014-06-21 NOTE — Progress Notes (Signed)
Inpatient Diabetes Program Recommendations  AACE/ADA: New Consensus Statement on Inpatient Glycemic Control (2013)  Target Ranges:  Prepandial:   less than 140 mg/dL      Peak postprandial:   less than 180 mg/dL (1-2 hours)      Critically ill patients:  140 - 180 mg/dL    Inpatient Diabetes Program Recommendations Insulin - Basal: Pt has had a couple of low glucose levels fasting-Please decrease the levemir back to 18 units as at home. Insulin - Meal Coverage: [id number]   In light of renal failure, may want to decrease correction to moderate tidwc as well (without the prednisone, the correction needs may not be as high before meals)  Thank you Rosita Kea, RN, MSN, CDE  Diabetes Inpatient Program Office: [phone] Pager: [phone] 8:00 am to 5:00 pm

## 2014-06-21 NOTE — Progress Notes (Signed)
C/O sore throat this AM, reports started yesterday. 1 vicodin given per patient's request. Cornell Barman

## 2014-06-21 NOTE — Progress Notes (Signed)
Social Work Patient ID: Darrell Richardson, male   DOB: Aug 09, 1940, 74 y.o.   MRN: [id number] Met with pt and wife to discuss equipment needs and follow up.  Pt is doing much better and wife has been here observing him in therapies. Have most equipment but will need a rental wheelchair and want his follow up through [facility] in [place].

## 2014-06-21 NOTE — Progress Notes (Signed)
Occupational Therapy Session Note  Patient Details  Name: KMARION RAWL MRN: [id number] Date of Birth: [birth year]  Today's Date: 06/21/2014 OT Individual Time: 0900-1000 OT Individual Time Calculation (min): 60 min    Short Term Goals: Week 1:  OT Short Term Goal 1 (Week 1): Pt.  will bathe UB EOB with mod assist OT Short Term Goal 2 (Week 1): Pt. will perform lateral leans with mod assist during bathing OT Short Term Goal 3 (Week 1): Pt. will dress UB with SBA for dynamic sitting balance OT Short Term Goal 4 (Week 1): Pt. will don pants with mod assist using AE PRN OT Short Term Goal 5 (Week 1): Pt. will transfer to [facility] with sliding board with mod assist  Skilled Therapeutic Interventions/Progress Updates:    Pt engaged in BADL retraining including bathing at shower level and dressing with sit<>stand from w/c at sink.  Pt completed grooming tasks seated in w/c at sink.  Pt performed all functional transfers with supervision.  Pt completed all BADLs with supervsion.  Pt requires multiple rest breaks throughout session. Focus on activity tolerance, sit<>stand, standing balance, functional amb with RW for home mgmt tasks, functional transfers, and safety awareness.  Pt's wife present throughout session.   Therapy Documentation Precautions:  Precautions Precautions: Fall Precaution Comments: WBAT on R leg (knee pain due to gout; also in hands, toes) Restrictions Weight Bearing Restrictions: No Pain: Pain Assessment Pain Assessment: No/denies pain  See FIM for current functional status  Therapy/Group: Individual Therapy  Leroy Libman 06/21/2014, 10:03 AM

## 2014-06-21 NOTE — Plan of Care (Signed)
Problem: Food- and Nutrition-Related Knowledge Deficit (NB-1.1) Goal: Nutrition education Formal process to instruct or train a patient/client in a skill or to impart knowledge to help patients/clients voluntarily manage or modify food choices and eating behavior to maintain or improve health. Outcome: Completed/Met Date Met:  06/21/14 RD consulted for nutrition education regarding renal diet.  Provided renal diet handout from the [facility], explaining serving sizes of food groups and micronutrients to regulate. Explained the importance of monitoring the intake of phosphorous, sodium and potassium.   Provided list of foods to limit or avoid, as well fluid restrictions. Went over a [employer] with these guidelines. Teach back method used.   Body mass index is 33.1 kg/(m^2). Pt meets criteria for Obesity Class I based on current BMI.  No other nutrition interventions warranted at this time. If any nutrition issues arise, please re-consult.  Wynona Dove, MS Dietetic Intern Pager: [phone]

## 2014-06-21 NOTE — Progress Notes (Signed)
Subjective/Complaints: Good weekend Has leg swelling that increases during the day No SOB     Objective: Vital Signs: Blood pressure 137/67, pulse 57, temperature 98.4 F (36.9 C), temperature source Oral, resp. rate 18, height 5\' 8"  (1.727 m), weight 99 kg (218 lb 4.1 oz), SpO2 96 %. No results found. Results for orders placed or performed during the hospital encounter of 06/11/14 (from the past 72 hour(s))  Glucose, capillary     Status: Abnormal   Collection Time: 06/18/14 12:11 PM  Result Value Ref Range   Glucose-Capillary 123 (H) 70 - 99 mg/dL   Comment 1 Notify RN   Glucose, capillary     Status: Abnormal   Collection Time: 06/18/14  4:41 PM  Result Value Ref Range   Glucose-Capillary 133 (H) 70 - 99 mg/dL   Comment 1 Notify RN   Glucose, capillary     Status: None   Collection Time: 06/18/14  8:52 PM  Result Value Ref Range   Glucose-Capillary 87 70 - 99 mg/dL   Comment 1 Notify RN   Glucose, capillary     Status: None   Collection Time: 06/19/14  6:33 AM  Result Value Ref Range   Glucose-Capillary 86 70 - 99 mg/dL   Comment 1 Notify RN   Glucose, capillary     Status: Abnormal   Collection Time: 06/19/14 11:36 AM  Result Value Ref Range   Glucose-Capillary 101 (H) 70 - 99 mg/dL  Glucose, capillary     Status: Abnormal   Collection Time: 06/19/14  4:24 PM  Result Value Ref Range   Glucose-Capillary 155 (H) 70 - 99 mg/dL  Glucose, capillary     Status: Abnormal   Collection Time: 06/19/14  9:14 PM  Result Value Ref Range   Glucose-Capillary 102 (H) 70 - 99 mg/dL   Comment 1 Notify RN   Glucose, capillary     Status: None   Collection Time: 06/20/14  6:53 AM  Result Value Ref Range   Glucose-Capillary 88 70 - 99 mg/dL   Comment 1 Notify RN   Glucose, capillary     Status: Abnormal   Collection Time: 06/20/14 11:17 AM  Result Value Ref Range   Glucose-Capillary 179 (H) 70 - 99 mg/dL  Glucose, capillary     Status: Abnormal   Collection Time: 06/20/14   4:52 PM  Result Value Ref Range   Glucose-Capillary 67 (L) 70 - 99 mg/dL  Glucose, capillary     Status: None   Collection Time: 06/20/14  5:49 PM  Result Value Ref Range   Glucose-Capillary 96 70 - 99 mg/dL  Glucose, capillary     Status: Abnormal   Collection Time: 06/20/14  9:13 PM  Result Value Ref Range   Glucose-Capillary 104 (H) 70 - 99 mg/dL   Comment 1 Notify RN   Glucose, capillary     Status: Abnormal   Collection Time: 06/21/14  6:39 AM  Result Value Ref Range   Glucose-Capillary 116 (H) 70 - 99 mg/dL   Comment 1 Notify RN      HEENT: normal Cardio: RRR and no murmur Resp: CTA B/L and unlabored GI: BS positive and NT, ND Extremity:  Edema scrotal and pretibial edema Skin:   Other erythema around scrotum Neuro: Alert/Oriented, Anxious, Normal Sensory and Abnormal Motor 4/5 bilateral delt bi tri grip, 3- bilateral HF, KE, ADF APF Musc/Skel:  Other no lower ext pain with ROM, edema at ankles,  mild erythema in feet Gen NAD  Assessment/Plan: 1. 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 Physiatrist is providing close team supervision and 24 hour management of active medical problems listed below. Physiatrist and rehab team continue to assess barriers to discharge/monitor patient progress toward functional and medical goals.     FIM: FIM - Bathing Bathing Steps Patient Completed: Chest, Right Arm, Left Arm, Abdomen, Front perineal area, Buttocks, Right upper leg, Left upper leg, Right lower leg (including foot), Left lower leg (including foot) Bathing: 5: Supervision: Safety issues/verbal cues  FIM - Upper Body Dressing/Undressing Upper body dressing/undressing steps patient completed: Thread/unthread right sleeve of pullover shirt/dresss, Thread/unthread left sleeve of pullover shirt/dress, Put head through  opening of pull over shirt/dress, Pull shirt over trunk Upper body dressing/undressing: 5: Supervision: Safety issues/verbal cues FIM - Lower Body Dressing/Undressing Lower body dressing/undressing steps patient completed: Thread/unthread right pants leg, Thread/unthread left pants leg, Pull pants up/down, Don/Doff right sock, Don/Doff left sock Lower body dressing/undressing: 4: Steadying Assist  FIM - Toileting Toileting steps completed by patient: Adjust clothing prior to toileting, Performs perineal hygiene, Adjust clothing after toileting Toileting: 4: Steadying assist  FIM - Radio producer Devices: Elevated toilet seat, Insurance account manager Transfers: 4-To toilet/BSC: Min A (steadying Pt. > 75%), 4-From toilet/BSC: Min A (steadying Pt. > 75%)  FIM - Bed/Chair Transfer Bed/Chair Transfer Assistive Devices: Arm rests Bed/Chair Transfer: 5: Bed > Chair or W/C: Supervision (verbal cues/safety issues)  FIM - Locomotion: Wheelchair Distance: 100 Locomotion: Wheelchair: 5: Travels 150 ft or more: maneuvers on rugs and over door sills with supervision, cueing or coaxing FIM - Locomotion: Ambulation Locomotion: Ambulation Assistive Devices: Administrator Ambulation/Gait Assistance: 4: Min guard, 5: Supervision Locomotion: Ambulation: 2: Travels 50 - 149 ft with minimal assistance (Pt.>75%)  Comprehension Comprehension Mode: Auditory Comprehension: 5-Follows basic conversation/direction: With no assist  Expression Expression Mode: Verbal Expression: 5-Expresses basic needs/ideas: With no assist  Social Interaction Social Interaction: 6-Interacts appropriately with others with medication or extra time (anti-anxiety, antidepressant).  Problem Solving Problem Solving Mode: Not assessed Problem Solving: 4-Solves basic 75 - 89% of the time/requires cueing 10 - 24% of the time  Memory Memory: 5-Recognizes or recalls 90% of the time/requires cueing < 10% of the  time  Medical Problem List and Plan: 1. Functional deficits secondary to debilitation after CHF exacerbation/respiratory failure/question polyarticular gout. 2.  DVT Prophylaxis/Anticoagulation: Subcutaneous heparin. Monitor platelet counts and any signs of bleeding 3. Pain Management: Lyrica 75 mg daily, Hydrocodone as needed. Monitor with increased mobility 4. Mood/depression. Zoloft 50 mg daily. Provide emotional support 5. Neuropsych: This patient is capable of making decisions on his own behalf. 6. Skin/Wound Care: Routine skin checks 7. Fluids/Electrolytes/Nutrition: Strict I&O with follow-up chemistries 8. Chronic renal insufficiency with baseline creatinine 2.44, currently 2.35 and stabilizing. It was felt elevation possibly due to prednisone therapy as well as in the setting of decompensated CHF and hypotension. Renal service to follow-up 9. 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 No significant improvement with steroids. Negative TEE. Negative anti-histone DNA. Await further studies. Solumedrol completed 10. Acute on chronic diastolic congestive heart failure. Continue to monitor with any signs of fluid overload. Lasix  discontinued 11. Diabetes mellitus with peripheral neuropathy. Monitor closely while on Solu-Medrol. Hemoglobin A1c 7.5. Lantus insulin 18 units daily. Check blood sugars before meals and at bedtime 12. History of CVA with left hemiparesis. Continue Plavix 13.  Elevated PSA pt has had  prostate biopsy about 16yrs ago, ?f/u with urology as outpt given all the medical issues currently in play LOS  14.   UTI ecoli ESBL on Fosfomycin   (Days) 10 A FACE TO FACE EVALUATION WAS PERFORMED  KIRSTEINS,ANDREW E  06/21/2014, 8:23 AM

## 2014-06-22 ENCOUNTER — Inpatient Hospital Stay (HOSPITAL_COMMUNITY): Payer: Medicare Other | Admitting: Rehabilitation

## 2014-06-22 ENCOUNTER — Inpatient Hospital Stay (HOSPITAL_COMMUNITY): Payer: Medicare Other | Admitting: Occupational Therapy

## 2014-06-22 ENCOUNTER — Inpatient Hospital Stay (HOSPITAL_COMMUNITY): Payer: Medicare Other

## 2014-06-22 LAB — BASIC METABOLIC PANEL
Anion gap: 8 (ref 5–15)
BUN: 44 mg/dL — AB (ref 6–23)
CO2: 25 mmol/L (ref 19–32)
CREATININE: 2.11 mg/dL — AB (ref 0.50–1.35)
Calcium: 8.1 mg/dL — ABNORMAL LOW (ref 8.4–10.5)
Chloride: 101 mmol/L (ref 96–112)
GFR calc non Af Amer: 29 mL/min — ABNORMAL LOW (ref >90)
GFR, EST AFRICAN AMERICAN: 34 mL/min — AB (ref >90)
GLUCOSE: 100 mg/dL — AB (ref 70–99)
Potassium: 4.1 mmol/L (ref 3.5–5.1)
Sodium: 134 mmol/L — ABNORMAL LOW (ref 135–145)

## 2014-06-22 LAB — GLUCOSE, CAPILLARY
GLUCOSE-CAPILLARY: 100 mg/dL — AB (ref 70–99)
GLUCOSE-CAPILLARY: 115 mg/dL — AB (ref 70–99)
Glucose-Capillary: 110 mg/dL — ABNORMAL HIGH (ref 70–99)
Glucose-Capillary: 171 mg/dL — ABNORMAL HIGH (ref 70–99)

## 2014-06-22 MED ORDER — LINAGLIPTIN 5 MG PO TABS
5.0000 mg | ORAL_TABLET | Freq: Every day | ORAL | Status: DC
Start: 2014-06-22 — End: 2014-06-23
  Administered 2014-06-23: 5 mg via ORAL
  Filled 2014-06-22 (×3): qty 1

## 2014-06-22 NOTE — Discharge Summary (Signed)
Darrell Richardson, Darrell Richardson NO.:  [id number]  MEDICAL RECORD NO.:  [id number]  LOCATION:  [id number]                        FACILITY:  [facility]  PHYSICIAN:  Charlett Blake, M.D.DATE OF BIRTH:  [birth year]  DATE OF ADMISSION:  06/11/2014 DATE OF DISCHARGE:  06/23/2014                              DISCHARGE SUMMARY   DISCHARGE DIAGNOSES: 1. Functional deficits secondary to debilitation after congestive     heart failure exacerbation with respiratory failure, question     polyarticular gout. 2. Subcutaneous heparin for deep vein thrombosis prophylaxis. 3. Pain management. 4. Depression. 5. Chronic renal insufficiency.  His baseline creatinine 2.44. 6. Arthralgias with myalgias. 7. Acute on chronic diastolic congestive heart failure. 8. Diabetes mellitus, peripheral neuropathy. 9. History of cerebrovascular accident with left hemiparesis. 10.Elevated PSA with the patient having prostate biopsy 10 years ago. 11.Escherichia coli extended spectrum beta-lactamases urinary tract     infection, resolved.  HISTORY OF PRESENT ILLNESS:  This is a 74 year old right-handed male with history of CVA, diabetes mellitus, gout, diastolic congestive heart failure and  chronic renal insufficiency.  His baseline creatinine 2.44. The patient lives with his wife, used a cane prior to admission. Presented to [facility] 31, 2014, with increasing nonspecific chest pain, shortness of breath, and transferred to [facility] for further medical evaluation.  Complains of increased shortness of breath and lower extremity edema.  He was found to have acute respiratory failure secondary to acute congestive heart failure exacerbation, placed on BiPAP.  Troponin negative.  BNP of 7363. He was placed on Lasix.  Echocardiogram with ejection fraction 45% with akinesis of the inferior lateral and inferior myocardium.  Progressive increasing creatinine function from baseline  2.4 to 5.37.  Renal Service is consulted.  Renal ultrasound showed no hydronephrosis.  Latest creatinine 4.02.  Complains of joint pain, low-grade fever, ESR of 135, uric acid level 8.3.  Placed on Solu-Medrol with workup concerning for PMR versus rheumatoid arthritis.  It was felt prednisone was also inducing his increase in renal function.  TEE completed showing no vegetation and moderate-size PFO with left-to-right shunting at rest. Full workup remained underway with ANCA studies normal.  MRI of the lumbar spine revealed some bulging disk and some spinal stenosis.  There was some concern over bone marrow abnormalities with Neurology Service follow up.  A bone scan completed with clarity that showed no evidence of osseous metastatic disease June 10, 2014.  Subcutaneous heparin for DVT prophylaxis.  Physical and occupational therapy ongoing.  The patient was admitted for a comprehensive rehab program.  PAST MEDICAL HISTORY:  See discharge diagnoses.  SOCIAL HISTORY:  Lives with his wife, independent with a cane walker prior to admission.  FUNCTIONAL STATUS:  Upon admission to rehab services was total assist for sit to stand using Clarise Cruz plus to transfer mod max assist, activities of daily living.  PHYSICAL EXAMINATION:  VITAL SIGNS:  Blood pressure 152/63, pulse 69, temperature 97, respirations 18. GENERAL:  This was an alert male, oriented x3. LUNGS:  Clear to auscultation. CARDIAC:  Rate controlled. ABDOMEN:  Soft, nontender.  Good bowel sounds.  He exhibit a weak grip due to pain in his  fingers, mild swelling at the knees, bilateral ankles.  He had some pain with decreased active range of motion.  Moves all 4 extremities.  REHABILITATION HOSPITAL COURSE:  The patient was admitted to inpatient rehab services with therapies initiated on a 3-hour daily basis consisting of physical therapy, occupational therapy, and rehabilitation nursing.  The following issues were addressed  during the patient's rehabilitation stay.  Pertaining to [name]'s functional deficits secondary to debilitation related to CHF exacerbation, respiratory failure remained stable.  He was followed by the heart failure team exhibiting no signs of fluid overload.  He remained on Lasix 80 mg daily.  Subcutaneous heparin for DVT prophylaxis.  No bleeding episodes. Pain managed with use of Lyrica 75 mg daily as well as hydrocodone. Chronic renal insufficiency, baseline creatinine 2.44 followed by Renal Services.  Baseline creatinine continued to improve down to 2.12.  He had completed a course of Solu-Medrol for some arthralgias, elevated uric acid levels that continued on allopurinol, mobility overall continued to improve.  Diabetes mellitus, peripheral neuropathy monitored closely while on Solu-Medrol and continued on insulin therapy and his Januvia was resumed at discharge and insulin discontinued. The patient with history of CVA left hemiparesis maintained on Plavix. Findings of elevated PSA, he had, had a prostate biopsy about 10 years ago.  He would follow up with his urologist, Dr. Yves Dill in [place], [place] for ongoing monitoring and management.  He completed a course of fosfomycin for ESBL urinary tract infection.  No dysuria or hematuria noted.  The patient received weekly collaborative interdisciplinary team conferences to discuss estimated length of stay, family teaching, and any barriers to discharge.  He was ambulating 125 feet x2 with a rolling walker, minimal guard to close supervision. Stair negotiation, strengthening endurance, full education with his wife.  He could navigate up and down stairs with supervision level. Strength and endurance continued to improve nicely.  He was able to gather his belongings for bathing, dressing and grooming.  He was able to complete his ADLs at sink side.  Performed all functional transfers with supervision.  Full family teaching  was completed and plan discharge to home.  DISCHARGE MEDICATIONS:  Included; 1. RisaQuad 2 capsules p.o. daily. 2. Zyloprim 100 mg p.o. daily. 3. Coreg 3.125 mg p.o. b.i.d. 4. Plavix 75 mg p.o. daily. 5. Colchicine 0.6 mg p.o. daily. 6. Lasix 80 mg p.o. daily. 7. Hydrocodone 1-2 tablets every 4 hours as needed for pain dispense     of 90 tablets. 8. Januvia 5 mg daily 9. Robaxin every 6 hours as needed for muscle spasms. 10.Pravachol 20 mg p.o. daily. 11.Lyrica 75 mg p.o. daily. 12.Zoloft 50 mg p.o. daily.  DIET:  Diabetic diet.  SPECIAL INSTRUCTIONS:  The patient would follow up Dr. Alysia Penna at the [facility] as needed; Dr. Donato Heinz, Renal Services July 05, 2014; Dr. Haroldine Laws, Cardiology Services call for appointment; Dr. Maryan Puls in regard to elevated PSA and follow up; Dr. Miguel Aschoff, Medical Management.     Lauraine Rinne, P.A.   ______________________________ Charlett Blake, M.D.    DA/MEDQ  D:  06/22/2014  T:  06/22/2014  Job:  [id number]  cc:   Miguel Aschoff, MD Charlett Blake, M.D. Sagamore Bensimhon, MD

## 2014-06-22 NOTE — Progress Notes (Signed)
Social Work Patient ID: Darrell Richardson, male   DOB: 1941/02/09, 74 y.o.   MRN: [id number] Met with pt and wife to discuss [facility] has changed their criteria and now only take pt's with a prognosis of 6 months or less.  Both feel this is not applicable for pt  Now and are agreeable to using another agency.  Have made referral to [facility] for follow up therapies.

## 2014-06-22 NOTE — Progress Notes (Signed)
Occupational Therapy Session Note  Patient Details  Name: Darrell Richardson MRN: [id number] Date of Birth: June 21, 1940  Today's Date: 06/22/2014 OT Individual Time: 4497-5300 OT Individual Time Calculation (min): 32 min   Skilled Therapeutic Interventions/Progress Updates:    Pt ambulated down to the therapy gym with close supervision using the RW.  Noted pt with decreased ability to clear his feet with increased trunk and knee flexion noted with mobility.  He was able to transition to the therapy mat and work on BUE exercises using medium resistance orange theraband.  Performed one set of 10 reps for shoulder flexion and shoulder horizontal abduction with min facilitation in the RUE.  He was able to perform 1 set of 20 reps for tricep extension and 2 sets of 10 reps for biceps flexion.  Pt able to perform elbow exercises with supervision.  He ambulated back to the room with the RW with supervision using the RW.    Therapy Documentation Precautions:  Precautions Precautions: Fall Precaution Comments: WBAT on R leg due to gout, decreased endurance Restrictions Weight Bearing Restrictions: No  Pain: Pain Assessment Pain Assessment: No/denies pain ADL: See FIM for current functional status  Therapy/Group: Individual Therapy  Neesa Knapik OTR/L 06/22/2014, 4:02 PM

## 2014-06-22 NOTE — Discharge Instructions (Signed)
Inpatient Rehab Discharge Instructions  Darrell Richardson Discharge date and time: No discharge date for patient encounter.   Activities/Precautions/ Functional Status: Activity: activity as tolerated Diet: diabetic diet Wound Care: none needed Functional status:  ___ No restrictions     ___ Walk up steps independently ___ 24/7 supervision/assistance   ___ Walk up steps with assistance ___ Intermittent supervision/assistance  ___ Bathe/dress independently ___ Walk with walker     ___ Bathe/dress with assistance ___ Walk Independently    ___ Shower independently _x__ Walk with assistance    ___ Shower with assistance ___ No alcohol     ___ Return to work/school ________  Special Instructions:   COMMUNITY REFERRALS UPON DISCHARGE:    Home Health:   PT, [place] VZCHY:[phone] Date of last service:06/23/2014  Medical Equipment/Items [place]    [phone]     My questions have been answered and I understand these instructions. I will adhere to these goals and the provided educational materials after my discharge from the hospital.  Patient/Caregiver Signature _______________________________ Date __________  Clinician Signature _______________________________________ Date __________  Please bring this form and your medication list with you to all your follow-up doctor's appointments.

## 2014-06-22 NOTE — Progress Notes (Signed)
Occupational Therapy Session Note  Patient Details  Name: Darrell Richardson MRN: [id number] Date of Birth: Oct 18, 1940  Today's Date: 06/22/2014 OT Individual Time: 0900-1000 OT Individual Time Calculation (min): 60 min    Short Term Goals: Week 1:  OT Short Term Goal 1 (Week 1): Pt.  will bathe UB EOB with mod assist OT Short Term Goal 2 (Week 1): Pt. will perform lateral leans with mod assist during bathing OT Short Term Goal 3 (Week 1): Pt. will dress UB with SBA for dynamic sitting balance OT Short Term Goal 4 (Week 1): Pt. will don pants with mod assist using AE PRN OT Short Term Goal 5 (Week 1): Pt. will transfer to [facility] with sliding board with mod assist  Skilled Therapeutic Interventions/Progress Updates:    Pt engaged in BADL retraining and continued family education in preparation for discharge home.  Pt amb with RW into/out of bathroom with supervision.  Pt completed bathing and toileting tasks at supervision level. Pt continues to fatigue quickly and requires multiple rest breaks throughout session.  Pt and wife pleased with progress and ready for discharge home.  Pt owns all necessary equipment.  Therapy Documentation Precautions:  Precautions Precautions: Fall Precaution Comments: WBAT on R leg due to gout, decreased endurance Restrictions Weight Bearing Restrictions: No Pain: Pain Assessment Pain Assessment: No/denies pain  See FIM for current functional status  Therapy/Group: Individual Therapy  Leroy Libman 06/22/2014, 10:03 AM

## 2014-06-22 NOTE — Progress Notes (Signed)
Occupational Therapy Discharge Summary  Patient Details  Name: Darrell Richardson MRN: [id number] Date of Birth: Apr 14, 1940  Patient has met 10 of 10 long term goals due to improved activity tolerance, improved balance, ability to compensate for deficits, improved coordination and decreased pain.  Pt made steady progress with BADLs during this admission and is supervison for all BADLs. Pt's wife has been present for all therapy sessions and verbalizes and demonstrates understanding of recommendations.  Pt continues to fatigue quickly and requires multiple rest breaks throughout sessions. Patient to discharge at overall Supervision  level.  Patient's care partner is independent to provide the necessary supervision assistance at discharge.     Recommendation:  Patient will benefit from ongoing skilled OT services in home health setting to continue to advance functional skills in the area of BADL.  Equipment: No equipment provided Pt owns shower seat and BSC  Reasons for discharge: treatment goals met  Patient/family agrees with progress made and goals achieved: Yes  OT Discharge   Vision/Perception  Vision- History Baseline Vision/History: Wears glasses Wears Glasses: Reading only Patient Visual Report: No change from baseline Vision- Assessment Vision Assessment?: No apparent visual deficits Eye Alignment: Within Functional Limits  Cognition Overall Cognitive Status: Within Functional Limits for tasks assessed Arousal/Alertness: Awake/alert Orientation Level: Oriented X4 Attention: Selective Focused Attention: Appears intact Sustained Attention: Appears intact Selective Attention: Appears intact Memory: Appears intact Awareness: Appears intact Safety/Judgment: Appears intact Sensation Sensation Light Touch: Appears Intact Stereognosis: Appears Intact Hot/Cold: Appears Intact Proprioception: Appears Intact Coordination Gross Motor Movements are Fluid and Coordinated: Yes Fine  Motor Movements are Fluid and Coordinated: Yes Motor  Motor Motor: Abnormal postural alignment and control Motor - Discharge Observations: Pt with generalized weakness and decreased balance.  Trunk/Postural Assessment  Cervical Assessment Cervical Assessment: Within Functional Limits Thoracic Assessment Thoracic Assessment: Exceptions to [facility] Thoracic AROM Overall Thoracic AROM Comments: limited thoracic extension - slouched positioning chronic in nature Lumbar Assessment Lumbar Assessment: Exceptions to [facility] Lumbar AROM Overall Lumbar AROM Comments: limited into all planes of motion: flexion, rotation, extension Postural Control Postural Control: Deficits on evaluation Postural Limitations: Pt continues to require cues for upright posture throughout sessions.   Balance Balance Balance Assessed: Yes Static Sitting Balance Static Sitting - Balance Support: Feet supported Static Sitting - Level of Assistance: 7: Independent Dynamic Sitting Balance Dynamic Sitting - Balance Support: Feet supported;During functional activity Dynamic Sitting - Level of Assistance: 6: Modified independent (Device/Increase time) Static Standing Balance Static Standing - Balance Support: During functional activity Static Standing - Level of Assistance: 5: Stand by assistance Dynamic Standing Balance Dynamic Standing - Balance Support: During functional activity;Left upper extremity supported Dynamic Standing - Level of Assistance: 5: Stand by assistance Extremity/Trunk Assessment RUE Assessment RUE Assessment: Within Functional Limits LUE Assessment LUE Assessment: Within Functional Limits  See FIM for current functional status  Leroy Libman 06/22/2014, 2:51 PM

## 2014-06-22 NOTE — Discharge Summary (Signed)
Discharge summary job [id number]

## 2014-06-22 NOTE — Progress Notes (Signed)
Physical Therapy Discharge Summary  Patient Details  Name: Darrell Richardson MRN: [id number] Date of Birth: November 17, 1940  Today's Date: 06/22/2014 PT Individual Time: 1100-1200 PT Individual Time Calculation (min): 60 min    Patient has met 13 of 13 long term goals due to improved activity tolerance, improved balance, improved postural control, increased strength, decreased pain, ability to compensate for deficits, improved attention, improved awareness and improved coordination.  Patient to discharge at an ambulatory level Supervision.   Patient's care partner is independent to provide the necessary physical and cognitive assistance at discharge.  Reasons goals not met: n/a  Recommendation:  Patient will benefit from ongoing skilled PT services in home health setting to continue to advance safe functional mobility, address ongoing impairments in decreased endurance, decreased generalized strength, decreased balance, and minimize fall risk.  Equipment: 20x18 w/c and cushion  Reasons for discharge: treatment goals met and discharge from hospital  Patient/family agrees with progress made and goals achieved: Yes   PT Treatment/Intervention:  Pt received sitting in w/c in room, note new w/c in room, therefore had pt transfer to new w/c.  Propelled x 200' in controlled and community simulated environment at mod I level with BUEs.  Remainder of skilled session focused on grad day activities and checking off all goals.  Pt performed several bouts of gait up to 150' at a time in controlled and home environment at S level with RW and min cues for upright posture and increased stride length.  Performed bed mobility in ADL apt at mod I level and furniture transfer at mod I level.  Performed car transfer (to simulated height with 6" step) at min A level.  Min cues for safe hand placement when getting into car.  Performed flight of stairs with use of B handrails at min A level and standing rest break in  between.  Did very well with cues for stepping sequence.  Ended session with dynamic standing balance with ball toss at S level with wife assisting with ball toss.  No overt LOB during task, however pt unwilling to reach too far outside of BOS.  Assessed strength, sensation, etc, see full details below.  Pt assisted back to room and left with all needs in reach.    PT Discharge Precautions/Restrictions Precautions Precautions: Fall Precaution Comments: WBAT on R leg due to gout, decreased endurance Restrictions Weight Bearing Restrictions: No  Pain Pain Assessment Pain Assessment: 0-10 Pain Score: 3  Pain Type: Chronic pain Pain Location: Ankle Pain Orientation: Right;Left Pain Descriptors / Indicators: Aching Pain Intervention(s): Rest Vision/Perception   See OT note Cognition Overall Cognitive Status: Within Functional Limits for tasks assessed Arousal/Alertness: Awake/alert Orientation Level: Oriented X4 Attention: Selective Focused Attention: Appears intact Sustained Attention: Appears intact Selective Attention: Appears intact Memory: Appears intact Awareness: Appears intact Safety/Judgment: Appears intact Sensation Sensation Light Touch: Appears Intact Stereognosis: Not tested Hot/Cold: Not tested Proprioception: Appears Intact Coordination Gross Motor Movements are Fluid and Coordinated: Yes Fine Motor Movements are Fluid and Coordinated: Yes (grossly WFL in BLEs) Heel Shin Test: grossly [facility] Motor  Motor Motor: Abnormal postural alignment and control Motor - Discharge Observations: Pt with generalized weakness and decreased balance.   Mobility Bed Mobility Bed Mobility: Supine to Sit;Sit to Supine Supine to Sit: 5: Supervision Supine to Sit Details: Verbal cues for sequencing;Verbal cues for technique;Verbal cues for precautions/safety Sit to Supine: 5: Supervision Sit to Supine - Details: Verbal cues for sequencing;Verbal cues for technique;Verbal cues for  precautions/safety Transfers Transfers: Yes  Sit to Stand: 5: Supervision Sit to Stand Details: Verbal cues for sequencing;Verbal cues for technique;Verbal cues for precautions/safety Stand to Sit: 5: Supervision Stand to Sit Details (indicate cue type and reason): Verbal cues for sequencing;Verbal cues for technique;Verbal cues for precautions/safety Stand Pivot Transfers: 5: Supervision Stand Pivot Transfer Details: Verbal cues for sequencing;Verbal cues for technique;Verbal cues for precautions/safety Locomotion  Ambulation Ambulation: Yes Ambulation/Gait Assistance: 5: Supervision;4: Min guard Ambulation Distance (Feet): 150 Feet Assistive device: Rolling walker Ambulation/Gait Assistance Details: Verbal cues for sequencing;Verbal cues for technique;Verbal cues for precautions/safety;Verbal cues for gait pattern;Verbal cues for safe use of DME/AE Gait Gait: Yes Gait Pattern: Impaired Gait Pattern: Decreased stride length;Shuffle;Trunk flexed Stairs / Additional Locomotion Stairs: Yes Stairs Assistance: 4: Min guard Stair Management Technique: Two rails;Step to pattern;Forwards Number of Stairs: 12 Height of Stairs: 6 Wheelchair Mobility Wheelchair Mobility: Yes Wheelchair Assistance: 6: Modified independent (Device/Increase time) Environmental health practitioner: Both lower extermities Wheelchair Parts Management: Independent Distance: 200  Trunk/Postural Assessment  Cervical Assessment Cervical Assessment: Within Functional Limits Thoracic Assessment Thoracic Assessment: Exceptions to [facility] Thoracic AROM Overall Thoracic AROM Comments: limited thoracic extension - slouched positioning chronic in nature Lumbar Assessment Lumbar Assessment: Exceptions to [facility] Lumbar AROM Overall Lumbar AROM Comments: limited into all planes of motion: flexion, rotation, extension Postural Control Postural Control: Deficits on evaluation Righting Reactions: pt with improving ankle and hip strategy,  but continues to have delayed stepping reaction and relies on UE.  Postural Limitations: Pt continues to require cues for upright posture throughout sessions.   Balance Balance Balance Assessed: Yes Static Sitting Balance Static Sitting - Balance Support: Feet supported Static Sitting - Level of Assistance: 7: Independent Dynamic Sitting Balance Dynamic Sitting - Balance Support: Feet supported;During functional activity Dynamic Sitting - Level of Assistance: 6: Modified independent (Device/Increase time) Static Standing Balance Static Standing - Balance Support: During functional activity Static Standing - Level of Assistance: 5: Stand by assistance Dynamic Standing Balance Dynamic Standing - Balance Support: During functional activity;Left upper extremity supported Dynamic Standing - Level of Assistance: 5: Stand by assistance Extremity Assessment      RLE Assessment RLE Assessment: Within Functional Limits RLE Strength RLE Overall Strength: Deficits RLE Overall Strength Comments: limited hip ROM, however strength grossly 4/5 overall LLE Assessment LLE Assessment: Exceptions to [facility] LLE Strength LLE Overall Strength: Deficits LLE Overall Strength Comments: limited hip flex and hamstrings ROM, however grossly 4/5  See FIM for current functional status  Denice Bors 06/22/2014, 12:43 PM

## 2014-06-22 NOTE — Progress Notes (Signed)
Subjective/Complaints: Good night  No joint pain No SOB     Objective: Vital Signs: Blood pressure 130/54, pulse 56, temperature 97.5 F (36.4 C), temperature source Oral, resp. rate 16, height 5\' 8"  (1.727 m), weight 97.5 kg (214 lb 15.2 oz), SpO2 99 %. No results found. Results for orders placed or performed during the hospital encounter of 06/11/14 (from the past 72 hour(s))  Glucose, capillary     Status: Abnormal   Collection Time: 06/19/14 11:36 AM  Result Value Ref Range   Glucose-Capillary 101 (H) 70 - 99 mg/dL  Glucose, capillary     Status: Abnormal   Collection Time: 06/19/14  4:24 PM  Result Value Ref Range   Glucose-Capillary 155 (H) 70 - 99 mg/dL  Glucose, capillary     Status: Abnormal   Collection Time: 06/19/14  9:14 PM  Result Value Ref Range   Glucose-Capillary 102 (H) 70 - 99 mg/dL   Comment 1 Notify RN   Glucose, capillary     Status: None   Collection Time: 06/20/14  6:53 AM  Result Value Ref Range   Glucose-Capillary 88 70 - 99 mg/dL   Comment 1 Notify RN   Glucose, capillary     Status: Abnormal   Collection Time: 06/20/14 11:17 AM  Result Value Ref Range   Glucose-Capillary 179 (H) 70 - 99 mg/dL  Glucose, capillary     Status: Abnormal   Collection Time: 06/20/14  4:52 PM  Result Value Ref Range   Glucose-Capillary 67 (L) 70 - 99 mg/dL  Glucose, capillary     Status: None   Collection Time: 06/20/14  5:49 PM  Result Value Ref Range   Glucose-Capillary 96 70 - 99 mg/dL  Glucose, capillary     Status: Abnormal   Collection Time: 06/20/14  9:13 PM  Result Value Ref Range   Glucose-Capillary 104 (H) 70 - 99 mg/dL   Comment 1 Notify RN   Glucose, capillary     Status: Abnormal   Collection Time: 06/21/14  6:39 AM  Result Value Ref Range   Glucose-Capillary 116 (H) 70 - 99 mg/dL   Comment 1 Notify RN   Glucose, capillary     Status: Abnormal   Collection Time: 06/21/14 11:32 AM  Result Value Ref Range   Glucose-Capillary 107 (H) 70 - 99  mg/dL  Glucose, capillary     Status: Abnormal   Collection Time: 06/21/14  4:50 PM  Result Value Ref Range   Glucose-Capillary 138 (H) 70 - 99 mg/dL   Comment 1 Notify RN   Glucose, capillary     Status: Abnormal   Collection Time: 06/21/14  9:01 PM  Result Value Ref Range   Glucose-Capillary 172 (H) 70 - 99 mg/dL  Glucose, capillary     Status: Abnormal   Collection Time: 06/22/14  7:05 AM  Result Value Ref Range   Glucose-Capillary 110 (H) 70 - 99 mg/dL     HEENT: normal Cardio: RRR and no murmur Resp: CTA B/L and unlabored GI: BS positive and NT, ND Extremity:  Edema scrotal and pretibial edema Skin:   Other erythema around scrotum Neuro: Alert/Oriented, Anxious, Normal Sensory and Abnormal Motor 4/5 bilateral delt bi tri grip, 3- bilateral HF, KE, ADF APF Musc/Skel:  Other no lower ext pain with ROM, edema at ankles,  mild erythema in feet Gen NAD   Assessment/Plan: 1. 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, polyarticular gout  Stable for D/C today F/u PCP in  1-2 weeks F/u PM&R 3 weeks See D/C summary See D/C instructions    FIM: FIM - Bathing Bathing Steps Patient Completed: Chest, Right Arm, Left Arm, Abdomen, Front perineal area, Buttocks, Right upper leg, Left upper leg, Right lower leg (including foot), Left lower leg (including foot) Bathing: 5: Supervision: Safety issues/verbal cues  FIM - Upper Body Dressing/Undressing Upper body dressing/undressing steps patient completed: Thread/unthread right sleeve of pullover shirt/dresss, Thread/unthread left sleeve of pullover shirt/dress, Put head through opening of pull over shirt/dress, Pull shirt over trunk Upper body dressing/undressing: 5: Supervision: Safety issues/verbal cues FIM - Lower Body Dressing/Undressing Lower body dressing/undressing steps patient completed: Thread/unthread right pants leg, Thread/unthread left pants leg, Pull pants up/down, Don/Doff right sock,  Don/Doff left sock Lower body dressing/undressing: 5: Supervision: Safety issues/verbal cues  FIM - Toileting Toileting steps completed by patient: Adjust clothing prior to toileting, Performs perineal hygiene, Adjust clothing after toileting Toileting: 4: Steadying assist  FIM - Radio producer Devices: Elevated toilet seat, Insurance account manager Transfers: 4-To toilet/BSC: Min A (steadying Pt. > 75%), 4-From toilet/BSC: Min A (steadying Pt. > 75%)  FIM - Bed/Chair Transfer Bed/Chair Transfer Assistive Devices: Arm rests Bed/Chair Transfer: 0: Activity did not occur  FIM - Locomotion: Wheelchair Distance: 100 Locomotion: Wheelchair: 0: Activity did not occur FIM - Locomotion: Ambulation Locomotion: Ambulation Assistive Devices: Administrator Ambulation/Gait Assistance: 5: Supervision, 4: Min guard Locomotion: Ambulation: 2: Travels 50 - 149 ft with minimal assistance (Pt.>75%)  Comprehension Comprehension Mode: Auditory Comprehension: 5-Follows basic conversation/direction: With no assist  Expression Expression Mode: Verbal Expression: 5-Expresses basic needs/ideas: With no assist  Social Interaction Social Interaction: 6-Interacts appropriately with others with medication or extra time (anti-anxiety, antidepressant).  Problem Solving Problem Solving Mode: Not assessed Problem Solving: 4-Solves basic 75 - 89% of the time/requires cueing 10 - 24% of the time  Memory Memory: 5-Recognizes or recalls 90% of the time/requires cueing < 10% of the time  Medical Problem List and Plan: 1. Functional deficits secondary to debilitation after CHF exacerbation/respiratory failure/question polyarticular gout. 2.  DVT Prophylaxis/Anticoagulation: Subcutaneous heparin. Monitor platelet counts and any signs of bleeding 3. Pain Management: Lyrica 75 mg daily, Hydrocodone as needed. Monitor with increased mobility 4. Mood/depression. Zoloft 50 mg daily. Provide  emotional support 5. Neuropsych: This patient is capable of making decisions on his own behalf. 6. Skin/Wound Care: Routine skin checks 7. Fluids/Electrolytes/Nutrition: Strict I&O with follow-up chemistries 8. Chronic renal insufficiency with baseline creatinine 2.44, currently 2.35 and stabilizing. It was felt elevation possibly due to prednisone therapy as well as in the setting of decompensated CHF and hypotension. Renal service to follow-up 9. Arthralgias/myalgias-felt to be polyarticular gout-discussed avoiding provcessed meats, sausage and pork products 10. Acute on chronic diastolic congestive heart failure. Continue to monitor with any signs of fluid overload. Lasix  discontinued 11. Diabetes mellitus with peripheral neuropathy.. Hemoglobin A1c 7.5. Lantus insulin 18 units daily. Check blood sugars before meals and at bedtime 12. History of CVA with left hemiparesis. Continue Plavix 13.  Elevated PSA pt has had prostate biopsy about 67yrs ago, ?f/u with urology as outpt given all the medical issues currently in play LOS  14.   UTI ecoli ESBL on Fosfomycin   (Days) 11 A FACE TO FACE EVALUATION WAS PERFORMED  Ian Castagna E  06/22/2014, 8:05 AM

## 2014-06-23 LAB — GLUCOSE, CAPILLARY: Glucose-Capillary: 102 mg/dL — ABNORMAL HIGH (ref 70–99)

## 2014-06-23 MED ORDER — FUROSEMIDE 80 MG PO TABS: 80.0000 mg | ORAL_TABLET | Freq: Every day | ORAL | Status: DC

## 2014-06-23 MED ORDER — RISAQUAD PO CAPS: 2.0000 | ORAL_CAPSULE | Freq: Every day | ORAL | Status: DC

## 2014-06-23 MED ORDER — PRAVASTATIN SODIUM 20 MG PO TABS: 20.0000 mg | ORAL_TABLET | Freq: Every day | ORAL | Status: DC

## 2014-06-23 MED ORDER — LINAGLIPTIN 5 MG PO TABS: 5.0000 mg | ORAL_TABLET | Freq: Every day | ORAL | Status: DC

## 2014-06-23 MED ORDER — HYDROCODONE-ACETAMINOPHEN 5-325 MG PO TABS: 1.0000 | ORAL_TABLET | ORAL | Status: DC | PRN

## 2014-06-23 MED ORDER — SERTRALINE HCL 50 MG PO TABS: 50.0000 mg | ORAL_TABLET | Freq: Every day | ORAL | Status: DC

## 2014-06-23 MED ORDER — ALLOPURINOL 100 MG PO TABS: 100.0000 mg | ORAL_TABLET | Freq: Every day | ORAL | Status: DC

## 2014-06-23 MED ORDER — METHOCARBAMOL 750 MG PO TABS: 750.0000 mg | ORAL_TABLET | Freq: Four times a day (QID) | ORAL | Status: DC | PRN

## 2014-06-23 MED ORDER — CARVEDILOL 3.125 MG PO TABS: 3.1250 mg | ORAL_TABLET | Freq: Two times a day (BID) | ORAL | Status: DC

## 2014-06-23 MED ORDER — PREGABALIN 75 MG PO CAPS: 75.0000 mg | ORAL_CAPSULE | Freq: Every day | ORAL | Status: DC

## 2014-06-23 MED ORDER — CLOPIDOGREL BISULFATE 75 MG PO TABS: 75.0000 mg | ORAL_TABLET | Freq: Every day | ORAL | Status: DC

## 2014-06-23 MED ORDER — COLCHICINE 0.6 MG PO TABS: 0.6000 mg | ORAL_TABLET | Freq: Every day | ORAL | Status: DC

## 2014-06-23 NOTE — Progress Notes (Signed)
Pt discharged at 49 with wife to home. Discharge instructions given by Silvestre Mesi, PA with verbal understanding. Belongings with pt

## 2014-06-23 NOTE — Progress Notes (Signed)
Social Work Discharge Note Discharge Note  The overall goal for the admission was met for:   Discharge location: Yes-HOME WITH WIFE WHO CAN PROVIDE 24 HR SUPERVISION LEVEL  Length of Stay: Yes-13 DAYS  Discharge activity level: Yes-SUPERVISION/MIN LEVEL  Home/community participation: Yes  Services provided included: MD, RD, PT, OT, SLP, RN, CM, TR, Pharmacy and SW  Financial Services: Medicare and Private Insurance: [place]  Follow-up services arranged: Home Health: [place], DME: [place] and Patient/Family has no preference for HH/DME agencies  Comments (or additional information):WIFE WAS HERE DAILY AND ATTENDED THERAPIES WITH PT.  BOTH FEEL HE DID VERY WELL HERE AND IS READY TO GO HOME.  Patient/Family verbalized understanding of follow-up arrangements: Yes  Individual responsible for coordination of the follow-up plan: PEGGY-WIFE  Confirmed correct DME delivered: Elease Hashimoto 06/23/2014    Elease Hashimoto

## 2014-06-23 NOTE — Progress Notes (Addendum)
Subjective/Complaints: Slept well No issues overnite     Objective: Vital Signs: Blood pressure 131/61, pulse 58, temperature 98.1 F (36.7 C), temperature source Oral, resp. rate 16, height _0  (1.727 m), weight 96.707 kg (213 lb 3.2 oz), SpO2 95 %. No results found. Results for orders placed or performed during the hospital encounter of 06/11/14 (from the past 72 hour(s))  Glucose, capillary     Status: Abnormal   Collection Time: 06/20/14 11:17 AM  Result Value Ref Range   Glucose-Capillary 179 (H) 70 - 99 mg/dL  Glucose, capillary     Status: Abnormal   Collection Time: 06/20/14  4:52 PM  Result Value Ref Range   Glucose-Capillary 67 (L) 70 - 99 mg/dL  Glucose, capillary     Status: None   Collection Time: 06/20/14  5:49 PM  Result Value Ref Range   Glucose-Capillary 96 70 - 99 mg/dL  Glucose, capillary     Status: Abnormal   Collection Time: 06/20/14  9:13 PM  Result Value Ref Range   Glucose-Capillary 104 (H) 70 - 99 mg/dL   Comment 1 Notify RN   Glucose, capillary     Status: Abnormal   Collection Time: 06/21/14  6:39 AM  Result Value Ref Range   Glucose-Capillary 116 (H) 70 - 99 mg/dL   Comment 1 Notify RN   Glucose, capillary     Status: Abnormal   Collection Time: 06/21/14 11:32 AM  Result Value Ref Range   Glucose-Capillary 107 (H) 70 - 99 mg/dL  Glucose, capillary     Status: Abnormal   Collection Time: 06/21/14  4:50 PM  Result Value Ref Range   Glucose-Capillary 138 (H) 70 - 99 mg/dL   Comment 1 Notify RN   Glucose, capillary     Status: Abnormal   Collection Time: 06/21/14  9:01 PM  Result Value Ref Range   Glucose-Capillary 172 (H) 70 - 99 mg/dL  Glucose, capillary     Status: Abnormal   Collection Time: 06/22/14  7:05 AM  Result Value Ref Range   Glucose-Capillary 110 (H) 70 - 99 mg/dL  Basic metabolic panel     Status: Abnormal   Collection Time: 06/22/14  7:28 AM  Result Value Ref Range   Sodium 134 (L) 135 - 145 mmol/L   Potassium 4.1  3.5 - 5.1 mmol/L   Chloride 101 96 - 112 mmol/L   CO2 25 19 - 32 mmol/L   Glucose, Bld 100 (H) 70 - 99 mg/dL   BUN 44 (H) 6 - 23 mg/dL   Creatinine, Ser 2.11 (H) 0.50 - 1.35 mg/dL   Calcium 8.1 (L) 8.4 - 10.5 mg/dL   GFR calc non Af Amer 29 (L) >90 mL/min   GFR calc Af Amer 34 (L) >90 mL/min    Comment: (NOTE) The eGFR has been calculated using the CKD EPI equation. This calculation has not been validated in all clinical situations. eGFR's persistently <90 mL/min signify possible Chronic Kidney Disease.    Anion gap 8 5 - 15  Glucose, capillary     Status: Abnormal   Collection Time: 06/22/14 12:03 PM  Result Value Ref Range   Glucose-Capillary 100 (H) 70 - 99 mg/dL  Glucose, capillary     Status: Abnormal   Collection Time: 06/22/14  4:34 PM  Result Value Ref Range   Glucose-Capillary 115 (H) 70 - 99 mg/dL  Glucose, capillary     Status: Abnormal   Collection Time: 06/22/14  8:53 PM  Result Value Ref Range   Glucose-Capillary 171 (H) 70 - 99 mg/dL  Glucose, capillary     Status: Abnormal   Collection Time: 06/23/14  6:50 AM  Result Value Ref Range   Glucose-Capillary 102 (H) 70 - 99 mg/dL     HEENT: normal Cardio: RRR and no murmur Resp: CTA B/L and unlabored GI: BS positive and NT, ND Extremity:  Edema scrotal and pretibial edema Skin:   Other erythema around scrotum Neuro: Alert/Oriented, Anxious, Normal Sensory and Abnormal Motor 4/5 bilateral delt bi tri grip, 3- bilateral HF, KE, ADF APF Musc/Skel:  Other no lower ext pain with ROM, edema at ankles,  mild erythema in feet Gen NAD   Assessment/Plan: 1. 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, polyarticular gout  Stable for D/C today F/u PCP in 1-2 weeks F/u PM&R 3 weeks See D/C summary See D/C instructions    FIM: FIM - Bathing Bathing Steps Patient Completed: Chest, Right Arm, Left Arm, Abdomen, Front perineal area, Buttocks, Right upper leg, Left upper leg, Right lower  leg (including foot), Left lower leg (including foot) Bathing: 5: Supervision: Safety issues/verbal cues  FIM - Upper Body Dressing/Undressing Upper body dressing/undressing steps patient completed: Thread/unthread right sleeve of pullover shirt/dresss, Thread/unthread left sleeve of pullover shirt/dress, Put head through opening of pull over shirt/dress, Pull shirt over trunk Upper body dressing/undressing: 5: Supervision: Safety issues/verbal cues FIM - Lower Body Dressing/Undressing Lower body dressing/undressing steps patient completed: Thread/unthread right pants leg, Thread/unthread left pants leg, Pull pants up/down, Don/Doff right sock, Don/Doff left sock Lower body dressing/undressing: 5: Supervision: Safety issues/verbal cues  FIM - Toileting Toileting steps completed by patient: Adjust clothing prior to toileting, Performs perineal hygiene, Adjust clothing after toileting Toileting: 4: Steadying assist  FIM - Radio producer Devices: Elevated toilet seat, Insurance account manager Transfers: 4-To toilet/BSC: Min A (steadying Pt. > 75%), 4-From toilet/BSC: Min A (steadying Pt. > 75%)  FIM - Bed/Chair Transfer Bed/Chair Transfer Assistive Devices: Walker, Arm rests Bed/Chair Transfer: 6: Supine > Sit: No assist, 6: Sit > Supine: No assist, 5: Bed > Chair or W/C: Supervision (verbal cues/safety issues), 5: Chair or W/C > Bed: Supervision (verbal cues/safety issues)  FIM - Locomotion: Wheelchair Distance: 200 Locomotion: Wheelchair: 6: Travels 150 ft or more, turns around, maneuvers to table, bed or toilet, negotiates 3% grade: maneuvers on rugs and over door sills independently FIM - Locomotion: Ambulation Locomotion: Ambulation Assistive Devices: Administrator Ambulation/Gait Assistance: 5: Supervision Locomotion: Ambulation: 5: Travels 150 ft or more with supervision/safety issues  Comprehension Comprehension Mode: Auditory Comprehension: 5-Understands  complex 90% of the time/Cues < 10% of the time  Expression Expression Mode: Verbal Expression: 5-Expresses complex 90% of the time/cues < 10% of the time  Social Interaction Social Interaction: 6-Interacts appropriately with others with medication or extra time (anti-anxiety, antidepressant).  Problem Solving Problem Solving Mode: Not assessed Problem Solving: 5-Solves complex 90% of the time/cues < 10% of the time  Memory Memory: 6-More than reasonable amt of time  Medical Problem List and Plan: 1. Functional deficits secondary to debilitation after CHF exacerbation/respiratory failure/question polyarticular gout. 2.  DVT Prophylaxis/Anticoagulation: Subcutaneous heparin. Monitor platelet counts and any signs of bleeding 3. Pain Management: Lyrica 75 mg daily, Hydrocodone as needed. Monitor with increased mobility 4. Mood/depression. Zoloft 50 mg daily. Provide emotional support 5. Neuropsych: This patient is capable of making decisions on his own behalf. 6. Skin/Wound Care: Routine skin checks 7. Fluids/Electrolytes/Nutrition: Strict I&O with  follow-up chemistries 8. Chronic renal insufficiency with baseline creatinine 2.44, currently 2.35 and stabilizing. It was felt elevation possibly due to prednisone therapy as well as in the setting of decompensated CHF and hypotension. Renal service to follow-up 9. Arthralgias/myalgias-felt to be polyarticular gout-discussed avoiding provcessed meats, sausage and pork products 10. Acute on chronic diastolic congestive heart failure. Continue to monitor with any signs of fluid overload. Lasix  discontinued 11. Diabetes mellitus with peripheral neuropathy.. Hemoglobin A1c 7.5. Lantus insulin 18 units daily. Check blood sugars before meals and at bedtime 12. History of CVA with left hemiparesis. Continue Plavix 13.  Elevated PSA pt has had prostate biopsy about 88yr ago, f/u with urology as outpt given all the medical issues currently in play LOS     (Days) 12 [place]  06/23/2014, 8:16 AM

## 2014-06-29 ENCOUNTER — Telehealth: Payer: Self-pay | Admitting: *Deleted

## 2014-06-29 NOTE — Telephone Encounter (Signed)
PT eval start of care done , and calling for plan for 2wk1, 3 wk 5   Verbal approval given.

## 2014-07-27 NOTE — H&P (Signed)
PATIENT NAME:  Darrell Richardson, Darrell Richardson MR#:  [id number] DATE OF BIRTH:  June 21, 1940  DATE OF ADMISSION:  01/06/2012  INDICATION FOR ADMISSION:  1. Nausea and vomiting and diarrhea.  2. Elevated serum creatinine.  3. Abdominal pain.  4. History of essential hypertension.  5. Obesity.   CLINICAL NOTE: This 74 year old male presented to the Emergency Room this evening with severe abdominal pain, explosive diarrhea and vomiting. His history was notable for two months of epigastric/upper abdominal discomfort. He was initially evaluated by his primary care provider, Darrell Murders, PA at [facility] in July with complaints of upper abdominal discomfort. Plain films obtained at that time were unremarkable. He was encouraged to make use of Gas-X but reported no relief. His symptoms have remained fairly stable since onset in July.  This evening he ate a meal of fried oysters and flounder at a local seafood restaurant and shortly afterward developed explosive diarrhea, abdominal pain, cramping, sweats and vomiting. He presented to the Emergency Room reporting that his blood pressure had been 673 systolic when the EMS had arrived. His evaluation in the ED consisted of a repeat series of plain films which showed no prominent findings except for mild dilatation of the stomach with an air-fluid level and a CT scan with a long litany of chronic findings but no acute findings. The ED physician was concerned about his abdominal tenderness, and Surgical consultation was requested.   The patient's past medical history is notable for elective laparoscopic cholecystectomy in 1996. In 2002, he presented to [facility] with hematemesis and upper endoscopy showing bleeding. He was transferred to [facility] and underwent exploration by Orvan Falconer, M.D. 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 In 2003, he had laparoscopic repair of bilateral hernias from his chevron incision. His last  office visit was in 2007 for some abdominal pain at which time he was noted to have mild elevation of his blood pressure at 156/86. Weight at that time was 239. He has since my last evaluation with him in 2007 been started on oral antihypertensives and oral hypoglycemic agents. It is apparently only after CVA in 2009 and 2010 that he began to take diabetic management seriously.   Some of his history was obtained from his wife, Darrell Richardson, as well as his daughter, Darrell Bray, RN, and his son, Darrell Richardson.   PHYSICAL EXAMINATION:  HEENT: The patient's clinical exam at presentation showed bloodshot eyes, and while initially there was a question about divergent gaze this was not confirmed on re-examination.   NECK: Exam showed no masses or tenderness. No thyromegaly.   LUNGS: Lungs were clear to auscultation.   CARDIAC: Exam showed a regular rhythm without murmur or gallop.   ABDOMEN: The abdomen was moderately obese, bowel sounds were normal. Minimal focal tenderness in the left lower quadrant. No peritoneal irritation, guarding, or referred pain. No evidence of recurrent ventral hernia.   EXTREMITIES: Femoral pulses were 2+ bilaterally. Distal pulses were weak. Calf examination shows them to be soft and nontender.   LABORATORY, DIAGNOSTIC AND RADIOLOGICAL DATA: Plain films of the abdomen showed a distended stomach with air-fluid levels. CT scan showed bilateral fat-containing hernias unchanged from 2009. There was a questionable right pelvic sidewall mass which the radiologist raised the concern for a possible low-grade liposarcoma. 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 No evidence of ureteral obstruction.  With his laboratory studies reviewed, he was noted to have an elevated serum creatinine of 1.7 with an eGFR of 39, BUN 29, random blood sugar 150, albumin 3.1, total protein 6.3, and the  remaining liver function studies normal. Hemoglobin 14.0, with hematocrit of 40.2, white blood cell count of 6300 with 80% polys, 6% lymphocytes, and 11% monocytes. Platelet count was normal at 193,000. Urinalysis showed 150 mg/dL of glucose, specific gravity 1.017, greater than 500 protein, 8 RBC, 1 WBC per high-power field. No bacteria identified. Lactic acid level was normal at 1.1.   IMPRESSION:  1. Severe gastroenteritis with recent deterioration of renal function.  2. Inability to maintain p.o. and prevent further dehydration and worsening renal function.  3. History of essential hypertension.  4. Abdominal pain, semiacute.   PLAN: The patient will be admitted, hydrated intravenously, and serial laboratory examinations undertaken. A renal ultrasound will be obtained today to confirm the absence of obstruction and any other source for his recently diagnosed mild renal failure.   ____________________________ Robert Bellow, MD jwb:cbb D: 01/06/2012 12:55:14 ET T: 01/06/2012 13:19:40 ET JOB#: [id number]  cc: Robert Bellow, MD, <Dictator> Vickki Muff. Chrismon, PA Adellyn Capek Amedeo Kinsman MD ELECTRONICALLY SIGNED 01/09/2012 22:15

## 2014-07-27 NOTE — Consult Note (Signed)
PATIENT NAME:  Darrell Richardson, STUTZ MR#:  [id number] DATE OF BIRTH:  Jun 16, 1940  DATE OF CONSULTATION:  01/06/2012  REFERRING PHYSICIAN:  Dr. Bary Castilla of Surgery  CONSULTING PHYSICIAN:  Malik Ruffino H. Posey Pronto, MD  PRIMARY MD: Dr. Rosanna Randy   REASON FOR CONSULTATION: Left-sided weakness.   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 He was seen in the ED and there was some concern for acute abdomen, therefore, Surgery admitted the patient. The patient reports that yesterday his blood pressure was very high in the 200's prior to coming to the ED and he started noticing some left-sided weakness. That left-sided weakness has gotten much worse today. He was seen by Dr. Bary Castilla and I was asked to evaluate the patient for possible CVA. The patient reports that he is feeling very weak and tired. In terms of his GI symptoms, the abdominal pain has improved. His diarrhea and vomiting has mostly subsided. He has not had any fevers or chills. He did eat yesterday oysters. The patient reports that his speech is okay. Does not have blurred vision. He feels like his left arm and left leg is very weak. He otherwise denies any chest pain or shortness of breath. He also has some mild chronic right-sided weakness.   PAST MEDICAL HISTORY:  1. Gout.  2. History of CVA x2 as described above.  3. Hypertension.  4. 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  5. Hyperlipidemia.  6. Coronary artery disease with history of MI in 1977. Has not had any intervention done to the heart.  7. Peripheral vascular disease with a stent to the left leg.  8. Diabetes, type II.  9. History of [place] spotted fever.   PAST SURGICAL HISTORY:  1. History of bowel surgery.   2. Status post cholecystectomy.  3. Status post hernia repair.  4. Status post right ankle surgery.   ALLERGIES: Cortisone acetate.   HOME MEDICATIONS:  1. Acetaminophen/hydrocodone 750/7.5 one tab 4 times per day as needed for pain. 2. Allopurinol 100 mg 1 tab p.o. b.i.d.  3. Fenofibrate iron 160 daily.  4. Lyrica 75 one tab p.o. b.i.d.  5. Metformin 500 mg 1 tab p.o. in the a.m., 2 tabs at bedtime. 6. Metoprolol succinate 25 mg 1 tab p.o. b.i.d.  7. Multivitamins 1 tab p.o. daily.  8. Norvasc 5 mg 1 tab p.o. b.i.d.  9. Plavix 75 mg p.o. daily.  10. Vitamin E 2000 IU daily.  11. Ambien 10 mg 1 tab p.o. at bedtime p.r.n. for sleep.   SOCIAL HISTORY: Denies any smoking, alcohol, or drug use.   FAMILY HISTORY: Mother is still alive, has dementia. Father died in a war.   REVIEW OF SYSTEMS: CONSTITUTIONAL: Complains of generalized weakness, fatigue. No weight loss. No weight gain. HEENT: Denies any double vision. No blurred vision. Denies any cataracts. ENT: Denies any nasal drainage. No epistaxis. Denies any difficulty with hearing. No ringing in the ears. Denies any difficulty swallowing. CARDIOVASCULAR: Denies any chest pains or palpitations. No syncope. Has history of hypertension. PULMONARY: Denies any COPD, wheezing, or hemoptysis. GI: Had abdominal pain that improved. Also had nausea and vomiting, this improved as well as diarrhea, this improved. Denies any hematemesis or hematochezia. GU: Denies any frequency, urgency,  or hesitancy with urination. HEME: Denies any easy bruisability. No anemia. LYMPHATICS: Denies any lymph node enlargements. PSYCHIATRIC: Denies any anxiety or depression. NEUROLOGIC: Previous history of CVA with right-sided weakness. No seizures.   PHYSICAL EXAMINATION:   VITAL SIGNS: Temperature 98.4, pulse 71, respirations 20, blood pressure 122/70, O2 93%.   GENERAL: The patient is an obese Caucasian male currently not in any acute distress.   HEENT: Head  atraumatic, normocephalic. Pupils equally round, reactive to light and accommodation. There is no conjunctival pallor. No scleral icterus. Nasal exam shows no drainage or ulceration. Oropharynx is clear without any exudate.   NECK: No thyromegaly. No carotid bruits.   CARDIOVASCULAR: Regular rate and rhythm. No murmurs, rubs, clicks, or gallops. PMI is not displaced.   LUNGS: Clear to auscultation bilaterally without any rales, rhonchi, or wheezing.   ABDOMEN: Soft, nontender, nondistended. Positive bowel sounds x4. There is no hepatosplenomegaly.   EXTREMITIES: No clubbing, cyanosis, or edema.   SKIN: No rash.   LYMPHATICS: No lymph nodes palpable.   VASCULAR: Good DP, PT pulses.   PSYCHIATRIC: Not anxious or depressed.   NEUROLOGICAL: Cranial nerves II through XII grossly intact.   MUSCULOSKELETAL: Strength exam shows he has bilateral weakness, left greater than right. Right side is 4 to 5 out of 5; left side is 4 out of 5. He is able to lift against gravity but is weak in the upper and the lower extremity. Babinski is downgoing. Sensation is intact. Reflexes 2+. Otherwise, he is awake, alert, and oriented.   LYMPH NODES: None palpable.   EVALUATIONS: CT scan of the head shows no acute intracranial processes.   Bilateral ultrasound of the kidneys shows no obstruction.   Creatinine today was 1.88, WBC 6.2, hemoglobin 12.6, platelet count 180. Lactic acid 1.1.   ASSESSMENT AND PLAN: The patient is a 74 year old with acute onset of left-sided weakness admitted for abdominal pain.  1. Left upper and left lower extremity weakness. Head CT scan is negative. Does have a history of CVA. Will need an MRI of the brain. Will get carotids and echocardiogram as well. Will place him on tele. Check an EKG. Will go ahead and restart his Plavix. Check a fasting lipid panel in the a.m.  2. Hyperlipidemia. Will resume fenofibrate. Repeat fasting lipid panel in the a.m.  3. Hypertension. Currently  his blood pressure is stable now. Will continue his current regimen.  4. Diabetes, type II. Will do sliding scale insulin for now. Hold metformin due to GI symptoms.  5. Possible acute gastroenteritis, improving. Continue supportive care.  6. Miscellaneous. Will place him on heparin for DVT prophylaxis.    TIME SPENT: 45 minutes.   ____________________________ Lafonda Mosses Posey Pronto, MD shp:drc D: 01/06/2012 18:20:00 ET T: 01/07/2012 05:45:15 ET JOB#: [id number]  cc: Zuriah Bordas H. Posey Pronto, MD, <Dictator> Richard L. Rosanna Randy, MD Alric Seton MD ELECTRONICALLY SIGNED 01/07/2012 15:32

## 2014-07-30 ENCOUNTER — Ambulatory Visit (INDEPENDENT_AMBULATORY_CARE_PROVIDER_SITE_OTHER): Payer: Medicare Other | Admitting: Cardiovascular Disease

## 2014-07-30 ENCOUNTER — Encounter: Payer: Self-pay | Admitting: Cardiovascular Disease

## 2014-07-30 ENCOUNTER — Encounter (INDEPENDENT_AMBULATORY_CARE_PROVIDER_SITE_OTHER): Payer: Self-pay

## 2014-07-30 VITALS — BP 108/44 | HR 74 | Ht 68.0 in | Wt 217.2 lb

## 2014-07-30 DIAGNOSIS — I159 Secondary hypertension, unspecified: Secondary | ICD-10-CM

## 2014-07-30 DIAGNOSIS — I5042 Chronic combined systolic (congestive) and diastolic (congestive) heart failure: Secondary | ICD-10-CM | POA: Diagnosis not present

## 2014-07-30 DIAGNOSIS — R0602 Shortness of breath: Secondary | ICD-10-CM

## 2014-07-30 DIAGNOSIS — R5381 Other malaise: Secondary | ICD-10-CM

## 2014-07-30 DIAGNOSIS — E1165 Type 2 diabetes mellitus with hyperglycemia: Secondary | ICD-10-CM

## 2014-07-30 DIAGNOSIS — IMO0002 Reserved for concepts with insufficient information to code with codable children: Secondary | ICD-10-CM

## 2014-07-30 DIAGNOSIS — E785 Hyperlipidemia, unspecified: Secondary | ICD-10-CM

## 2014-07-30 DIAGNOSIS — I2510 Atherosclerotic heart disease of native coronary artery without angina pectoris: Secondary | ICD-10-CM | POA: Diagnosis not present

## 2014-07-30 MED ORDER — FUROSEMIDE 80 MG PO TABS: 80.0000 mg | ORAL_TABLET | Freq: Two times a day (BID) | ORAL | Status: DC | PRN

## 2014-07-30 NOTE — Assessment & Plan Note (Signed)
Blood pressure is well controlled on today's visit. No changes made to the medications. 

## 2014-07-30 NOTE — Assessment & Plan Note (Signed)
Pravastatin recently held by primary care Recent LFTs appeared normal He will discuss this again with primary care. This could probably be restarted. He may benefit from high-dose fish oils, fenofibrate for elevated triglycerides

## 2014-07-30 NOTE — Progress Notes (Signed)
Patient ID: Darrell Richardson, male    DOB: 12-23-1940, 74 y.o.   MRN: [id number]  HPI Comments: Darrell Richardson is a 74 year old male with recent long hospital course at [facility] March 2016 with combined systolic and diastolic CHF exacerbation, respiratory failure requiring BiPAP, also with prior smoking history for 20 years, history of stroke 2 in 2009 and 2010, diabetes, hypertension, coronary disease with old inferior MI in 84s with recent echocardiogram confirming inferior wall akinesis, who presents to establish care in the [place] office. He has underlying renal failure, baseline creatinine greater than 2  Please refer to hospital discharge summary from March 2016. He had 15 pound weight loss through diuresis. He has had general deconditioning since his stroke, walks with a walker, not very active at baseline Since discharge from the hospital, he was at rehabilitation for 2 weeks, now doing rehabilitation at home 3 days per week.  Initial weight when he got home from rehabilitation was 203 pounds. Weight recently 210 pounds. He reports having worsening foot swelling, itching, shortness of breath. He's been taking Lasix 80 mg daily  Recently seen by Signe Colt, colchicine held, pravastatin held, Zoloft held  EKG on today's visit shows normal sinus rhythm with rate 74 bpm, incomplete right bundle branch block, old inferior MI  Lab work 05/11/2014 showing total cholesterol 260, triglycerides 800  Allergies  Allergen Reactions  . Cortisone Anaphylaxis and Other (See Comments)    Cold, elevated BP, shaking all over and fever  Wife says tolerates hydrocortisone cream at home  . Ambien [Zolpidem Tartrate]     Did not tolerate well or remembered anything from previous night  . Rocephin [Ceftriaxone] Hives  . Levaquin [Levofloxacin In D5w] Rash    Outpatient Encounter Prescriptions as of 07/30/2014  Medication Sig  . acidophilus (RISAQUAD) CAPS capsule Take 2 capsules by mouth  daily.  Marland Kitchen allopurinol (ZYLOPRIM) 100 MG tablet Take 1 tablet (100 mg total) by mouth daily.  . carvedilol (COREG) 3.125 MG tablet Take 1 tablet (3.125 mg total) by mouth 2 (two) times daily with a meal.  . Cholecalciferol (VITAMIN D3) 1000 UNITS CAPS Take 1,000 Units by mouth daily.  . clopidogrel (PLAVIX) 75 MG tablet Take 1 tablet (75 mg total) by mouth daily with breakfast.  . docusate sodium (COLACE) 100 MG capsule Take 100 mg by mouth at bedtime.  . furosemide (LASIX) 80 MG tablet Take 1 tablet (80 mg total) by mouth 2 (two) times daily as needed.  Marland Kitchen HYDROcodone-acetaminophen (NORCO/VICODIN) 5-325 MG per tablet Take 1-2 tablets by mouth every 4 (four) hours as needed for moderate pain.  Marland Kitchen linagliptin (TRADJENTA) 5 MG TABS tablet Take 1 tablet (5 mg total) by mouth daily.  . methocarbamol (ROBAXIN) 750 MG tablet Take 1 tablet (750 mg total) by mouth every 6 (six) hours as needed for muscle spasms.  . Multiple Vitamin (MULTIVITAMIN) tablet Take 1 tablet by mouth daily.  . pregabalin (LYRICA) 75 MG capsule Take 1 capsule (75 mg total) by mouth daily.  . [DISCONTINUED] furosemide (LASIX) 80 MG tablet Take 1 tablet (80 mg total) by mouth daily.  . [DISCONTINUED] furosemide (LASIX) 80 MG tablet Take 1 tablet (80 mg total) by mouth 2 (two) times daily as needed.  . [DISCONTINUED] colchicine 0.6 MG tablet Take 1 tablet (0.6 mg total) by mouth daily. (Patient not taking: Reported on 07/30/2014)  . [DISCONTINUED] pravastatin (PRAVACHOL) 20 MG tablet Take 1 tablet (20 mg total) by mouth daily. (Patient not taking: Reported on  07/30/2014)  . [DISCONTINUED] sertraline (ZOLOFT) 50 MG tablet Take 1 tablet (50 mg total) by mouth at bedtime. (Patient not taking: Reported on 07/30/2014)    Past Medical History  Diagnosis Date  . Hearing loss   . Gout   . Diabetes   . Fatigue   . Hypertension   . Stroke     X 2  . Heart failure   . Muscle pain   . Hammertoe     Past Surgical History  Procedure  Laterality Date  . Stomach surgery    . Foot surgery Right   . Tee without cardioversion N/A 06/07/2014    Procedure: TRANSESOPHAGEAL ECHOCARDIOGRAM (TEE);  Surgeon: Jolaine Artist, MD;  Location: [facility] ENDOSCOPY;  Service: Cardiovascular;  Laterality: N/A;    Social History  reports that he has quit smoking. He does not have any smokeless tobacco history on file. He reports that he drinks alcohol. He reports that he does not use illicit drugs.  Family History family history includes Dementia in his mother.   Review of Systems  Constitutional: Positive for fatigue.  Respiratory: Negative.   Cardiovascular: Positive for leg swelling.  Gastrointestinal: Negative.   Musculoskeletal: Negative.   Skin: Negative.   Neurological: Positive for weakness.  Hematological: Negative.   Psychiatric/Behavioral: Negative.   All other systems reviewed and are negative.   BP 108/44 mmHg  Pulse 74  Ht 5\' 8"  (1.727 m)  Wt 217 lb 4 oz (98.544 kg)  BMI 33.04 kg/m2  Physical Exam  Constitutional: He is oriented to person, place, and time. He appears well-developed and well-nourished.  HENT:  Head: Normocephalic.  Nose: Nose normal.  Mouth/Throat: Oropharynx is clear and moist.  Eyes: Conjunctivae are normal. Pupils are equal, round, and reactive to light.  Neck: Normal range of motion. Neck supple. No JVD present.  Cardiovascular: Normal rate, regular rhythm, S1 normal, S2 normal, normal heart sounds and intact distal pulses.  Exam reveals no gallop and no friction rub.   No murmur heard. Trace edema of feet and ankles bilaterally  Pulmonary/Chest: Effort normal and breath sounds normal. No respiratory distress. He has no wheezes. He has no rales. He exhibits no tenderness.  Abdominal: Soft. Bowel sounds are normal. He exhibits no distension. There is no tenderness.  Musculoskeletal: Normal range of motion. He exhibits no edema or tenderness.  Lymphadenopathy:    He has no cervical  adenopathy.  Neurological: He is alert and oriented to person, place, and time. Coordination normal.  Skin: Skin is warm and dry. No rash noted. No erythema.  Psychiatric: He has a normal mood and affect. His behavior is normal. Judgment and thought content normal.      Assessment and Plan   Nursing note and vitals reviewed.

## 2014-07-30 NOTE — Assessment & Plan Note (Signed)
Currently with no symptoms of angina. No further workup at this time. Continue current medication regimen. Unable to do cardiac catheterization given renal dysfunction

## 2014-07-30 NOTE — Assessment & Plan Note (Signed)
We have encouraged continued exercise, careful diet management in an effort to lose weight. 

## 2014-07-30 NOTE — Patient Instructions (Addendum)
Weight is up from 203 to 210 pounds Please take lasix 80 mg twice a day for weight greater than 207 pounds  For weight less than 207, Continue lasix 80 mg once a day  Please call us if you have new issues that need to be addressed before your next appt.  Your physician wants you to follow-up in: 3 months.  You will receive a reminder letter in the mail two months in advance. If you don't receive a letter, please call our office to schedule the follow-up appointment.

## 2014-07-30 NOTE — Assessment & Plan Note (Signed)
Weight is trending up 7 pounds over the past week or 2. Recommended he take Lasix 80 mg twice a day for weight more than 207 pounds, back to once a day for weight less than 207 pounds Weight continues to trend upwards, recommended that he call our office Metolazone could be used, torsemide could be used if needed

## 2014-07-30 NOTE — Addendum Note (Signed)
Addended by: Minna Merritts on: 07/30/2014 01:29 PM   Modules accepted: Level of Service

## 2014-07-30 NOTE — Assessment & Plan Note (Signed)
Significant deconditioning, currently working with physical therapy

## 2014-08-01 ENCOUNTER — Inpatient Hospital Stay
Admission: EM | Admit: 2014-08-01 | Discharge: 2014-08-08 | DRG: 689 | Disposition: A | Discharge status: Home Health | Payer: Medicare Other | Attending: Internal Medicine | Admitting: Internal Medicine

## 2014-08-01 DIAGNOSIS — K5730 Diverticulosis of large intestine without perforation or abscess without bleeding: Secondary | ICD-10-CM | POA: Diagnosis present

## 2014-08-01 DIAGNOSIS — I129 Hypertensive chronic kidney disease with stage 1 through stage 4 chronic kidney disease, or unspecified chronic kidney disease: Secondary | ICD-10-CM | POA: Diagnosis present

## 2014-08-01 DIAGNOSIS — J9811 Atelectasis: Secondary | ICD-10-CM | POA: Diagnosis present

## 2014-08-01 DIAGNOSIS — E8770 Fluid overload, unspecified: Secondary | ICD-10-CM | POA: Diagnosis present

## 2014-08-01 DIAGNOSIS — N3000 Acute cystitis without hematuria: Secondary | ICD-10-CM | POA: Diagnosis present

## 2014-08-01 DIAGNOSIS — E119 Type 2 diabetes mellitus without complications: Secondary | ICD-10-CM | POA: Diagnosis present

## 2014-08-01 DIAGNOSIS — Z66 Do not resuscitate: Secondary | ICD-10-CM | POA: Diagnosis present

## 2014-08-01 DIAGNOSIS — Z79899 Other long term (current) drug therapy: Secondary | ICD-10-CM

## 2014-08-01 DIAGNOSIS — D539 Nutritional anemia, unspecified: Secondary | ICD-10-CM | POA: Diagnosis not present

## 2014-08-01 DIAGNOSIS — I5032 Chronic diastolic (congestive) heart failure: Secondary | ICD-10-CM | POA: Diagnosis present

## 2014-08-01 DIAGNOSIS — I2510 Atherosclerotic heart disease of native coronary artery without angina pectoris: Secondary | ICD-10-CM | POA: Diagnosis present

## 2014-08-01 DIAGNOSIS — Z9049 Acquired absence of other specified parts of digestive tract: Secondary | ICD-10-CM | POA: Diagnosis present

## 2014-08-01 DIAGNOSIS — Z888 Allergy status to other drugs, medicaments and biological substances status: Secondary | ICD-10-CM | POA: Diagnosis not present

## 2014-08-01 DIAGNOSIS — N179 Acute kidney failure, unspecified: Secondary | ICD-10-CM | POA: Diagnosis present

## 2014-08-01 DIAGNOSIS — D649 Anemia, unspecified: Secondary | ICD-10-CM | POA: Diagnosis present

## 2014-08-01 DIAGNOSIS — E1122 Type 2 diabetes mellitus with diabetic chronic kidney disease: Secondary | ICD-10-CM

## 2014-08-01 DIAGNOSIS — N400 Enlarged prostate without lower urinary tract symptoms: Secondary | ICD-10-CM | POA: Diagnosis present

## 2014-08-01 DIAGNOSIS — K210 Gastro-esophageal reflux disease with esophagitis: Secondary | ICD-10-CM | POA: Diagnosis not present

## 2014-08-01 DIAGNOSIS — D126 Benign neoplasm of colon, unspecified: Secondary | ICD-10-CM | POA: Diagnosis not present

## 2014-08-01 DIAGNOSIS — Z8673 Personal history of transient ischemic attack (TIA), and cerebral infarction without residual deficits: Secondary | ICD-10-CM | POA: Diagnosis not present

## 2014-08-01 DIAGNOSIS — Y95 Nosocomial condition: Secondary | ICD-10-CM | POA: Diagnosis present

## 2014-08-01 DIAGNOSIS — N170 Acute kidney failure with tubular necrosis: Secondary | ICD-10-CM | POA: Diagnosis present

## 2014-08-01 DIAGNOSIS — I509 Heart failure, unspecified: Secondary | ICD-10-CM | POA: Diagnosis present

## 2014-08-01 DIAGNOSIS — Z86010 Personal history of colonic polyps: Secondary | ICD-10-CM

## 2014-08-01 DIAGNOSIS — N184 Chronic kidney disease, stage 4 (severe): Secondary | ICD-10-CM | POA: Diagnosis present

## 2014-08-01 DIAGNOSIS — K4090 Unilateral inguinal hernia, without obstruction or gangrene, not specified as recurrent: Secondary | ICD-10-CM | POA: Diagnosis present

## 2014-08-01 DIAGNOSIS — N200 Calculus of kidney: Secondary | ICD-10-CM | POA: Diagnosis present

## 2014-08-01 DIAGNOSIS — N10 Acute tubulo-interstitial nephritis: Secondary | ICD-10-CM | POA: Diagnosis present

## 2014-08-01 DIAGNOSIS — B9620 Unspecified Escherichia coli [E. coli] as the cause of diseases classified elsewhere: Secondary | ICD-10-CM | POA: Diagnosis present

## 2014-08-01 DIAGNOSIS — I700 Atherosclerosis of aorta: Secondary | ICD-10-CM | POA: Diagnosis present

## 2014-08-01 DIAGNOSIS — I10 Essential (primary) hypertension: Secondary | ICD-10-CM

## 2014-08-01 DIAGNOSIS — Z7902 Long term (current) use of antithrombotics/antiplatelets: Secondary | ICD-10-CM

## 2014-08-01 DIAGNOSIS — N281 Cyst of kidney, acquired: Secondary | ICD-10-CM | POA: Diagnosis present

## 2014-08-01 DIAGNOSIS — M1000 Idiopathic gout, unspecified site: Secondary | ICD-10-CM

## 2014-08-01 LAB — BASIC METABOLIC PANEL
ANION GAP: 11 (ref 7–16)
BUN: 64 mg/dL — ABNORMAL HIGH
CALCIUM: 8.5 mg/dL — AB
Chloride: 101 mmol/L
Co2: 24 mmol/L
Creatinine: 3.69 mg/dL — ABNORMAL HIGH
EGFR (African American): 18 — ABNORMAL LOW
GFR CALC NON AF AMER: 15 — AB
Glucose: 194 mg/dL — ABNORMAL HIGH
Potassium: 4.9 mmol/L
Sodium: 136 mmol/L

## 2014-08-01 LAB — CBC WITH DIFFERENTIAL/PLATELET
BASOS ABS: 0.1 10*3/uL (ref 0.0–0.1)
Basophil %: 2.1 %
EOS PCT: 6.1 %
Eosinophil #: 0.3 10*3/uL (ref 0.0–0.7)
HCT: 28 % — ABNORMAL LOW (ref 40.0–52.0)
HGB: 9.1 g/dL — ABNORMAL LOW (ref 13.0–18.0)
LYMPHS ABS: 0.7 10*3/uL — AB (ref 1.0–3.6)
Lymphocyte %: 14.2 %
MCH: 28.6 pg (ref 26.0–34.0)
MCHC: 32.5 g/dL (ref 32.0–36.0)
MCV: 88 fL (ref 80–100)
MONO ABS: 0.7 x10 3/mm (ref 0.2–1.0)
MONOS PCT: 14.1 %
Neutrophil #: 3.2 10*3/uL (ref 1.4–6.5)
Neutrophil %: 63.5 %
Platelet: 203 10*3/uL (ref 150–440)
RBC: 3.18 10*6/uL — ABNORMAL LOW (ref 4.40–5.90)
RDW: 17.4 % — AB (ref 11.5–14.5)
WBC: 5 10*3/uL (ref 3.8–10.6)

## 2014-08-01 LAB — URINALYSIS, COMPLETE
BILIRUBIN, UR: NEGATIVE
Glucose,UR: 50 mg/dL (ref 0–75)
KETONE: NEGATIVE
Nitrite: NEGATIVE
PH: 5 (ref 4.5–8.0)
Protein: 100
SQUAMOUS EPITHELIAL: NONE SEEN
Specific Gravity: 1.01 (ref 1.003–1.030)

## 2014-08-02 LAB — BASIC METABOLIC PANEL
Anion Gap: 8 (ref 7–16)
BUN: 60 mg/dL — ABNORMAL HIGH
CHLORIDE: 106 mmol/L
CO2: 23 mmol/L
CREATININE: 3.28 mg/dL — AB
Calcium, Total: 7.7 mg/dL — ABNORMAL LOW
EGFR (African American): 20 — ABNORMAL LOW
EGFR (Non-African Amer.): 18 — ABNORMAL LOW
Glucose: 115 mg/dL — ABNORMAL HIGH
Potassium: 4.1 mmol/L
Sodium: 137 mmol/L

## 2014-08-02 LAB — CBC WITH DIFFERENTIAL/PLATELET
BASOS PCT: 2 %
Basophil #: 0.1 10*3/uL (ref 0.0–0.1)
Eosinophil #: 0.3 10*3/uL (ref 0.0–0.7)
Eosinophil %: 7.4 %
HCT: 24.9 % — ABNORMAL LOW (ref 40.0–52.0)
HGB: 8.3 g/dL — ABNORMAL LOW (ref 13.0–18.0)
Lymphocyte #: 0.9 10*3/uL — ABNORMAL LOW (ref 1.0–3.6)
Lymphocyte %: 21.3 %
MCH: 29.5 pg (ref 26.0–34.0)
MCHC: 33.2 g/dL (ref 32.0–36.0)
MCV: 89 fL (ref 80–100)
MONO ABS: 0.7 x10 3/mm (ref 0.2–1.0)
Monocyte %: 16.4 %
Neutrophil #: 2.2 10*3/uL (ref 1.4–6.5)
Neutrophil %: 52.9 %
Platelet: 165 10*3/uL (ref 150–440)
RBC: 2.8 10*6/uL — AB (ref 4.40–5.90)
RDW: 17.7 % — ABNORMAL HIGH (ref 11.5–14.5)
WBC: 4.1 10*3/uL (ref 3.8–10.6)

## 2014-08-02 LAB — OCCULT BLOOD X 1 CARD TO LAB, STOOL: Occult Blood, Feces: NEGATIVE

## 2014-08-03 DIAGNOSIS — D539 Nutritional anemia, unspecified: Secondary | ICD-10-CM

## 2014-08-03 DIAGNOSIS — D649 Anemia, unspecified: Secondary | ICD-10-CM

## 2014-08-03 DIAGNOSIS — Z86010 Personal history of colonic polyps: Secondary | ICD-10-CM

## 2014-08-03 DIAGNOSIS — D126 Benign neoplasm of colon, unspecified: Secondary | ICD-10-CM

## 2014-08-03 DIAGNOSIS — K210 Gastro-esophageal reflux disease with esophagitis: Secondary | ICD-10-CM

## 2014-08-03 LAB — IRON AND TIBC
IRON BIND. CAP.(TOTAL): 176 — AB (ref 250–450)
IRON: 21 ug/dL — AB
Iron Saturation: 11.9
Unbound Iron-Bind.Cap.: 155

## 2014-08-03 LAB — BASIC METABOLIC PANEL
Anion Gap: 6 — ABNORMAL LOW (ref 7–16)
BUN: 51 mg/dL — ABNORMAL HIGH
CO2: 23 mmol/L
CREATININE: 2.8 mg/dL — AB
Calcium, Total: 7.8 mg/dL — ABNORMAL LOW
Chloride: 107 mmol/L
EGFR (Non-African Amer.): 21 — ABNORMAL LOW
GFR CALC AF AMER: 25 — AB
GLUCOSE: 118 mg/dL — AB
POTASSIUM: 4.4 mmol/L
Sodium: 136 mmol/L

## 2014-08-03 LAB — CBC WITH DIFFERENTIAL/PLATELET
BASOS ABS: 0.1 10*3/uL (ref 0.0–0.1)
Basophil %: 1.8 %
EOS ABS: 0.3 10*3/uL (ref 0.0–0.7)
Eosinophil %: 6.6 %
HCT: 26.4 % — AB (ref 40.0–52.0)
HGB: 8.6 g/dL — ABNORMAL LOW (ref 13.0–18.0)
LYMPHS ABS: 0.7 10*3/uL — AB (ref 1.0–3.6)
LYMPHS PCT: 13.1 %
MCH: 28.9 pg (ref 26.0–34.0)
MCHC: 32.8 g/dL (ref 32.0–36.0)
MCV: 88 fL (ref 80–100)
MONO ABS: 0.7 x10 3/mm (ref 0.2–1.0)
MONOS PCT: 13.4 %
NEUTROS PCT: 65.1 %
Neutrophil #: 3.4 10*3/uL (ref 1.4–6.5)
Platelet: 185 10*3/uL (ref 150–440)
RBC: 2.99 10*6/uL — AB (ref 4.40–5.90)
RDW: 17.3 % — ABNORMAL HIGH (ref 11.5–14.5)
WBC: 5.3 10*3/uL (ref 3.8–10.6)

## 2014-08-03 LAB — PROTEIN / CREATININE RATIO, URINE
CREATININE, URINE RANDOM: 72 mg/dL (ref 30–125)
PROTEIN, URINE: 220 mg/dL (ref 0–9)
Protein/Creat. Ratio: 3056 mg/gCREAT — ABNORMAL HIGH (ref 0–200)

## 2014-08-03 LAB — MAGNESIUM: Magnesium: 1.8 mg/dL

## 2014-08-03 LAB — SODIUM, URINE, RANDOM: Sodium, Urine Random: 32 mmol/L (ref 20–110)

## 2014-08-03 LAB — HEMOGLOBIN A1C: HEMOGLOBIN A1C: 6.1 % — AB

## 2014-08-03 LAB — PHOSPHORUS: Phosphorus: 4.5 mg/dL

## 2014-08-03 LAB — FERRITIN: Ferritin (ARMC): 219 ng/mL

## 2014-08-04 ENCOUNTER — Encounter: Payer: Self-pay | Admitting: General Surgery

## 2014-08-04 DIAGNOSIS — J449 Chronic obstructive pulmonary disease, unspecified: Secondary | ICD-10-CM | POA: Insufficient documentation

## 2014-08-04 DIAGNOSIS — Z8739 Personal history of other diseases of the musculoskeletal system and connective tissue: Secondary | ICD-10-CM | POA: Insufficient documentation

## 2014-08-04 DIAGNOSIS — I509 Heart failure, unspecified: Secondary | ICD-10-CM | POA: Insufficient documentation

## 2014-08-04 DIAGNOSIS — E669 Obesity, unspecified: Secondary | ICD-10-CM | POA: Insufficient documentation

## 2014-08-04 DIAGNOSIS — I2510 Atherosclerotic heart disease of native coronary artery without angina pectoris: Secondary | ICD-10-CM | POA: Insufficient documentation

## 2014-08-04 DIAGNOSIS — R809 Proteinuria, unspecified: Secondary | ICD-10-CM | POA: Insufficient documentation

## 2014-08-04 LAB — BASIC METABOLIC PANEL
Anion Gap: 7 (ref 7–16)
BUN: 45 mg/dL — ABNORMAL HIGH
CO2: 22 mmol/L
Calcium, Total: 7.8 mg/dL — ABNORMAL LOW
Chloride: 107 mmol/L
Creatinine: 2.73 mg/dL — ABNORMAL HIGH
EGFR (African American): 25 — ABNORMAL LOW
GFR CALC NON AF AMER: 22 — AB
Glucose: 141 mg/dL — ABNORMAL HIGH
Potassium: 4.4 mmol/L
Sodium: 136 mmol/L

## 2014-08-04 LAB — SURGICAL PATHOLOGY

## 2014-08-04 LAB — HEMOGLOBIN: HGB: 8.1 g/dL — ABNORMAL LOW (ref 13.0–18.0)

## 2014-08-05 ENCOUNTER — Encounter: Payer: Self-pay | Admitting: General Surgery

## 2014-08-05 LAB — BASIC METABOLIC PANEL
Anion Gap: 5 — ABNORMAL LOW (ref 7–16)
BUN: 39 mg/dL — ABNORMAL HIGH
CALCIUM: 7.9 mg/dL — AB
CHLORIDE: 109 mmol/L
Co2: 21 mmol/L — ABNORMAL LOW
Creatinine: 2.53 mg/dL — ABNORMAL HIGH
GFR CALC AF AMER: 28 — AB
GFR CALC NON AF AMER: 24 — AB
Glucose: 99 mg/dL
Potassium: 4.5 mmol/L
Sodium: 135 mmol/L

## 2014-08-05 LAB — PROTEIN ELECTROPHORESIS(ARMC)

## 2014-08-06 ENCOUNTER — Encounter: Payer: Self-pay | Admitting: Internal Medicine

## 2014-08-06 ENCOUNTER — Encounter: Payer: Self-pay | Admitting: General Surgery

## 2014-08-06 ENCOUNTER — Ambulatory Visit: Payer: Medicare Other | Admitting: Cardiology

## 2014-08-06 LAB — BASIC METABOLIC PANEL
ANION GAP: 5 — AB (ref 7–16)
BUN: 33 mg/dL — ABNORMAL HIGH
CALCIUM: 8.4 mg/dL — AB
Chloride: 112 mmol/L — ABNORMAL HIGH
Co2: 21 mmol/L — ABNORMAL LOW
Creatinine: 2.09 mg/dL — ABNORMAL HIGH
EGFR (African American): 35 — ABNORMAL LOW
EGFR (Non-African Amer.): 30 — ABNORMAL LOW
Glucose: 139 mg/dL — ABNORMAL HIGH
POTASSIUM: 4.7 mmol/L
SODIUM: 138 mmol/L

## 2014-08-06 LAB — CULTURE, BLOOD (SINGLE)

## 2014-08-06 LAB — UR PROT ELECTROPHORESIS, URINE RANDOM

## 2014-08-07 DIAGNOSIS — I509 Heart failure, unspecified: Secondary | ICD-10-CM

## 2014-08-07 LAB — BASIC METABOLIC PANEL
Anion Gap: 6 — ABNORMAL LOW (ref 7–16)
BUN: 29 mg/dL — AB
CHLORIDE: 111 mmol/L
CREATININE: 1.93 mg/dL — AB
Calcium, Total: 8.5 mg/dL — ABNORMAL LOW
Co2: 21 mmol/L — ABNORMAL LOW
GFR CALC AF AMER: 39 — AB
GFR CALC NON AF AMER: 33 — AB
Glucose: 116 mg/dL — ABNORMAL HIGH
Potassium: 4.3 mmol/L
Sodium: 138 mmol/L

## 2014-08-07 LAB — MAGNESIUM: MAGNESIUM: 1.7 mg/dL

## 2014-08-07 MED ORDER — BISACODYL 5 MG PO TBEC: 10.0000 mg | DELAYED_RELEASE_TABLET | Freq: Every day | ORAL | Status: DC | PRN

## 2014-08-07 MED ORDER — HEPARIN SODIUM (PORCINE) 5000 UNIT/ML IJ SOLN
5000.0000 [IU] | Freq: Three times a day (TID) | INTRAMUSCULAR | Status: DC
  Administered 2014-08-08: 5000 [IU] via SUBCUTANEOUS
  Filled 2014-08-07: qty 1

## 2014-08-07 MED ORDER — METOPROLOL TARTRATE 25 MG PO TABS: 25.0000 mg | ORAL_TABLET | Freq: Two times a day (BID) | ORAL | Status: DC

## 2014-08-07 MED ORDER — METOPROLOL TARTRATE 25 MG PO TABS
25.0000 mg | ORAL_TABLET | Freq: Two times a day (BID) | ORAL | Status: DC
  Administered 2014-08-08: 25 mg via ORAL
  Filled 2014-08-07: qty 1

## 2014-08-07 MED ORDER — HYDROCODONE-ACETAMINOPHEN 5-325 MG PO TABS: 1.0000 | ORAL_TABLET | ORAL | Status: DC | PRN

## 2014-08-07 MED ORDER — ACETAMINOPHEN 325 MG PO TABS: 650.0000 mg | ORAL_TABLET | ORAL | Status: DC | PRN

## 2014-08-07 MED ORDER — AMLODIPINE BESYLATE 5 MG PO TABS: 5.0000 mg | ORAL_TABLET | Freq: Two times a day (BID) | ORAL | Status: DC

## 2014-08-07 MED ORDER — SODIUM CHLORIDE 0.9 % IJ SOLN: 5.0000 mL | Freq: Every day | INTRAMUSCULAR | Status: DC | PRN

## 2014-08-07 MED ORDER — SODIUM CHLORIDE 0.9 % IJ SOLN
5.0000 mL | Freq: Two times a day (BID) | INTRAMUSCULAR | Status: DC
  Administered 2014-08-08: 5 mL via INTRAVENOUS

## 2014-08-07 MED ORDER — ZOLPIDEM TARTRATE 5 MG PO TABS: 5.0000 mg | ORAL_TABLET | Freq: Every evening | ORAL | Status: DC | PRN

## 2014-08-07 MED ORDER — CLOPIDOGREL BISULFATE 75 MG PO TABS
75.0000 mg | ORAL_TABLET | Freq: Every day | ORAL | Status: DC
  Administered 2014-08-08: 75 mg via ORAL
  Filled 2014-08-07 (×2): qty 1

## 2014-08-07 MED ORDER — SODIUM CHLORIDE 0.9 % IJ SOLN: 3.0000 mL | INTRAMUSCULAR | Status: DC | PRN

## 2014-08-07 MED ORDER — FERROUS SULFATE 325 (65 FE) MG PO TABS
325.0000 mg | ORAL_TABLET | Freq: Two times a day (BID) | ORAL | Status: DC
  Administered 2014-08-08: 325 mg via ORAL
  Filled 2014-08-07: qty 1

## 2014-08-07 MED ORDER — ONDANSETRON 4 MG PO TBDP: 4.0000 mg | ORAL_TABLET | Freq: Four times a day (QID) | ORAL | Status: DC | PRN

## 2014-08-07 MED ORDER — FUROSEMIDE 80 MG PO TABS
80.0000 mg | ORAL_TABLET | Freq: Every day | ORAL | Status: DC
  Filled 2014-08-07 (×3): qty 1

## 2014-08-07 MED ORDER — ALLOPURINOL 100 MG PO TABS
100.0000 mg | ORAL_TABLET | Freq: Every day | ORAL | Status: DC
  Administered 2014-08-08: 100 mg via ORAL
  Filled 2014-08-07 (×2): qty 1

## 2014-08-07 MED ORDER — BISACODYL 5 MG PO TBEC: 10.0000 mg | DELAYED_RELEASE_TABLET | Freq: Every day | ORAL | Status: DC

## 2014-08-07 MED ORDER — PANTOPRAZOLE SODIUM 40 MG PO TBEC
40.0000 mg | DELAYED_RELEASE_TABLET | Freq: Every day | ORAL | Status: DC
  Administered 2014-08-08: 40 mg via ORAL
  Filled 2014-08-07: qty 1

## 2014-08-07 MED ORDER — SODIUM CHLORIDE 0.9 % IV SOLN
1.0000 g | INTRAVENOUS | Status: DC
  Administered 2014-08-08: 1 g via INTRAVENOUS
  Filled 2014-08-07 (×2): qty 1

## 2014-08-07 MED ORDER — AMLODIPINE BESYLATE 5 MG PO TABS
5.0000 mg | ORAL_TABLET | Freq: Two times a day (BID) | ORAL | Status: DC
  Administered 2014-08-08: 5 mg via ORAL
  Filled 2014-08-07 (×2): qty 1

## 2014-08-07 MED ORDER — PREGABALIN 75 MG PO CAPS
75.0000 mg | ORAL_CAPSULE | Freq: Two times a day (BID) | ORAL | Status: DC
  Administered 2014-08-08: 75 mg via ORAL
  Filled 2014-08-07: qty 1

## 2014-08-07 MED ORDER — INSULIN ASPART 100 UNIT/ML ~~LOC~~ SOLN: 0.0000 [IU] | Freq: Three times a day (TID) | SUBCUTANEOUS | Status: DC

## 2014-08-07 MED ORDER — DOCUSATE SODIUM 100 MG PO CAPS: 100.0000 mg | ORAL_CAPSULE | Freq: Two times a day (BID) | ORAL | Status: DC | PRN

## 2014-08-08 LAB — BASIC METABOLIC PANEL
Anion gap: 7 (ref 5–15)
BUN: 26 mg/dL — AB (ref 6–20)
CHLORIDE: 111 mmol/L (ref 101–111)
CO2: 21 mmol/L — AB (ref 22–32)
Calcium: 8.6 mg/dL — ABNORMAL LOW (ref 8.9–10.3)
Creatinine, Ser: 1.85 mg/dL — ABNORMAL HIGH (ref 0.61–1.24)
GFR calc non Af Amer: 34 mL/min — ABNORMAL LOW (ref >60)
GFR, EST AFRICAN AMERICAN: 40 mL/min — AB (ref >60)
Glucose, Bld: 117 mg/dL — ABNORMAL HIGH (ref 65–99)
POTASSIUM: 4 mmol/L (ref 3.5–5.1)
Sodium: 139 mmol/L (ref 135–145)

## 2014-08-08 LAB — PLATELET COUNT: PLATELETS: 256 10*3/uL (ref 150–440)

## 2014-08-08 LAB — GLUCOSE, CAPILLARY
GLUCOSE-CAPILLARY: 148 mg/dL — AB (ref 70–99)
Glucose-Capillary: 95 mg/dL (ref 70–99)

## 2014-08-08 MED ORDER — AMLODIPINE BESYLATE 5 MG PO TABS: 5.0000 mg | ORAL_TABLET | Freq: Two times a day (BID) | ORAL | Status: DC

## 2014-08-08 MED ORDER — FERROUS SULFATE 325 (65 FE) MG PO TABS: 325.0000 mg | ORAL_TABLET | Freq: Every morning | ORAL | Status: DC

## 2014-08-08 MED ORDER — SODIUM CHLORIDE 0.9 % IV SOLN: 5.0000 mL | Freq: Two times a day (BID) | INTRAVENOUS | Status: DC

## 2014-08-08 MED ORDER — HEPARIN SODIUM (PORCINE) 5000 UNIT/ML IJ SOLN
INTRAMUSCULAR | Status: AC
  Filled 2014-08-08: qty 1

## 2014-08-08 MED ORDER — SODIUM CHLORIDE 0.9 % IV SOLN: 1.0000 g | INTRAVENOUS | Status: DC

## 2014-08-08 NOTE — Consult Note (Signed)
Stage 4 Renal Failure:    Diabetes Mellitus, Type II (NIDD):    obesity:    [place] spotted fever:    Hypercholesterolemia:    Neuropathy:    CVA/Stroke:    Coronary Artery Disease:    MI:    DM:    INTESTINAL BLOCKAGE/SURGERY:   Home Medications: Medication Instructions Status  Plavix 75 mg oral tablet 1 tab(s) orally once a day Active  multivitamin 1 tab(s) orally once a day Active  acetaminophen-HYDROcodone 325 mg-5 mg oral tablet 1-2 tab(s) orally every 4 hours, As Needed - for Pain  Active  allopurinol 100 mg oral tablet 1 tab(s) orally once a day Active  Lyrica 75 mg oral capsule 1 cap(s) orally once a day Active  Calcium 600+D 600 mg-200 units oral tablet 1 tab(s) orally once a day Active  methocarbamol 750 mg oral tablet 2 tab(s) orally every 6 hours, As Needed Active  Tradjenta 5 mg oral tablet 1 tab(s) orally once a day Active  furosemide 80 mg oral tablet 1 tab(s) orally once a day Active  docusate sodium sodium 100 mg oral capsule 1 cap(s) orally once a day (at bedtime) Active  Acidophilus - oral tablet 2 tab(s) orally once a day Active  colchicine 0.6 mg oral tablet 1 tab(s) orally once a day Active  carvedilol 3.125 mg oral tablet 1 tab(s) orally 2 times a day Active   Lab Results: Routine Chem:  26-Apr-16 05:25   Magnesium, Serum 1.8 (1.7-2.4 THERAPEUTIC RANGE: 4-7 mg/dL TOXIC: > 10 mg/dL  ----------------------- NOTE: New Reference Range  06/15/14)  Glucose, Serum  118 (65-99 NOTE: New Reference Range  06/15/14)  BUN  51 (6-20 NOTE: New Reference Range  06/15/14)  Creatinine (comp)  2.80 (0.61-1.24 NOTE: New Reference Range  06/15/14)  Sodium, Serum 136 (135-145 NOTE: New Reference Range  06/15/14)  Potassium, Serum 4.4 (3.5-5.1 NOTE: New Reference Range  06/15/14)  Chloride, Serum 107 (101-111 NOTE: New Reference Range  06/15/14)  CO2, Serum 23 (22-32 NOTE: New Reference Range  06/15/14)  Calcium (Total), Serum  7.8  (8.9-10.3 NOTE: New Reference Range  06/15/14)  Anion Gap  6  eGFR (African American)  25  eGFR (Non-African American)  21 (eGFR values <76m/min/1.73 m2 may be an indication of chronic kidney disease (CKD). Calculated eGFR is useful in patients with stable renal function. The eGFR calculation will not be reliable in acutely ill patients when serum creatinine is changing rapidly. It is not useful in patients on dialysis. The eGFR calculation may not be applicable to patients at the low and high extremes of body sizes, pregnant women, and vegetarians.)  Routine Hem:  26-Apr-16 05:25   WBC (CBC) 5.3  RBC (CBC)  2.99  Hemoglobin (CBC)  8.6  Hematocrit (CBC)  26.4  Platelet Count (CBC) 185  MCV 88  MCH 28.9  MCHC 32.8  RDW  17.3  Neutrophil % 65.1  Lymphocyte % 13.1  Monocyte % 13.4  Eosinophil % 6.6  Basophil % 1.8  Neutrophil # 3.4  Lymphocyte #  0.7  Monocyte # 0.7  Eosinophil # 0.3  Basophil # 0.1 (Result(s) reported on 03 Aug 2014 at [facility])   Radiology Results:  Radiology Results: LabUnknown:    25-Apr-16 18:22, CT Abdomen and Pelvis Without Contrast  PACS Image  CT:  CT Abdomen and Pelvis Without Contrast  REASON FOR EXAM:    (1) abd pain, ARF/CKD, UTI; (2) abd pain, ARF/CKD, UTI  COMMENTS:  PROCEDURE: CT  - CT ABDOMEN AND PELVIS W0  - Aug 02 2014  6:22PM     CLINICAL DATA:  Lower abdominal pain, acute on chronic kidney  disease, anemia    EXAM:  CT ABDOMEN AND PELVIS WITHOUT CONTRAST    TECHNIQUE:  Multidetector CT imaging of the abdomen and pelvis was performed  following the standard protocol without IV contrast.  COMPARISON:  06/11/2014    FINDINGS:  Lower chest:  Lung bases are essentially clear.    Cardiomegaly.    Hepatobiliary: Unenhanced liver is unremarkable.    Status post cholecystectomy. No intrahepatic or extrahepatic ductal  dilatation.    Pancreas: Within normal limits.    Spleen: Within normal limits.  Adrenals/Urinary  Tract: Adrenal glands are within normal limits.    Kidneys are notable for nonspecific stranding. Punctate  nonobstructing bilateral renal calculi, measuring 1-2 mm in the  right upper and lower poles (coronal images 92 and 100) and 1-2 mm  in the left interpolar region (coronal image 89) and left lower pole  (coronal images 74-75).    No ureteral or bladder calculi.    Thick-walled bladder.    Stomach/Bowel: Stomach is within normal limits.    No evidence of bowel obstruction.  Normal appendix.    Sigmoid diverticulosis, without evidence of diverticulitis.    Vascular/Lymphatic: Atherosclerotic calcifications of the abdominal  aorta and branch vessels.    No suspicious abdominopelvic lymphadenopathy.    Reproductive: Prostatomegaly.    Other: Noabdominopelvic ascites.    Moderate fat containing right inguinal hernia.  Fat within the left inguinal canal.    Musculoskeletal: Degenerative changes of the visualized  thoracolumbar spine.     IMPRESSION:  Bilateral nonobstructing renal calculi measuring up to 2 mm. No  ureteral or bladder calculi. Mildly thick-walled bladder.    No evidence of bowel obstruction. Normal appendix. Colonic  diverticulosis, without evidence of diverticulitis.    Moderate fat containing right inguinal hernia.    Electronically Signed    By: Julian Hy M.D.    On: 08/02/2014 18:51         Verified By: Julian Hy, M.D.,    Cortisone Acetate: Anaphylaxis  Rocephin: Hives  Levaquin: Hives  Ambien: Hallucinations  Nursing Flowsheets: **Vital Signs.:   26-Apr-16 08:02  Vital Signs Type Q 8hr  Celsius 36.5  Temperature Source oral  Pulse Pulse 69  Respirations Respirations 20  Systolic BP Systolic BP 160  Diastolic BP (mmHg) Diastolic BP (mmHg) 68  Mean BP 98  Pulse Ox % Pulse Ox % 96  Pulse Ox Activity Level  At rest  Oxygen Delivery Room Air/ 21 %  *Intake and Output.:   Shift 26-Apr-16 15:00  Grand Totals  Intake:  0 Output:  0    Net:  0 24 Hr.:  0  Urine ml     Out:  0  Length of Stay Totals Intake:  2408 Output:  2450    Net:  -42    General Aspect Patient presented to the ED with a cc of abdominal pain for the last 2 weeks.  He had been diagnosed with an UTI after catheterization at [facility].  He was treated with Bactrim for 7 days and then [facility] for 2 weeks with no improvement.  He is now admitted for IV  antibiotics.  UCx pending.  CT scan demonstrated punctate renal stones and a thick walled bladder.  Patient's wife states he has a hx of BPH, but no UTIs.  His PSA at [facility] was elevated.   Case History and Physical Exam:  Chief Complaint Abdominal Pain   Past Medical Health Hypertension, Diabetes Mellitus, Stroke, Other, CHF   Past Surgical History Inguinal Hernia Repair  Cholecystectomy   Primary Care Provider Other  Dr. Rosanna Randy   Family History Coronary Artery Disease   HEENT PERLA   Neck/Nodes Supple   Chest/Lungs Clear   Cardiovascular No Murmurs or Gallops   Abdomen Benign   Genitalia Not examined   Rectal Not examined   Musculoskeletal Full range of motion   Neurological Grossly WNL   Skin Warm  Dry    Impression Acute lower abdominal pain with acute cystitis/acute pyelonhritis.  He failied outpatient antibiotics.  Started on IV Zosyn.  UCx still prelimary in status.  He is undergoing endoscopy/colonscopy today for new onset anemia.  Thick walled bladder found on CT scan.  Per wife he has a hx of BPH and an elevated PSA at [facility] admission for CHF.  Reviewing the CT scan his prostate is not enlarged and the thick walled bladder may be due to the cystitis.  Discussed the case with Dr. Elnoria Howard. Recommend to treat UTI per sensitivies.  Obtain a PVR.  If greater than >174m, place foley.   Dr. HElnoria Howardto see later today.   Electronic Signatures: MZara CouncilA (PA)  (Signed 26-Apr-16 11:15)  Authored: Significant Events - History, Home  Medications, Labs, Radiology Results, Allergies, Vital Signs, General Aspect/Present Illness, History and Physical Exam, Impression/Plan   Last Updated: 26-Apr-16 11:15 by MZara CouncilA (PA)

## 2014-08-08 NOTE — H&P (Addendum)
PATIENT NAME:  Darrell Richardson, Darrell Richardson MR#:  [id number] DATE OF BIRTH:  1940/07/01  DATE OF ADMISSION:  08/01/2014  PRIMARY CARE PHYSICIAN: Richard L. Rosanna Randy, MD  REFERRING EMERGENCY ROOM PHYSICIAN: Briant Sites. Joni Fears, MD   CHIEF COMPLAINT: Abdominal pain.   HISTORY OF PRESENT ILLNESS: The patient is a 74 year old male. He is coming into the ED with a chief complaint of abdominal pain for the past 2 weeks. He is complaining of lower abdominal pain, diagnosed with UTI recently, and treated with 2 courses of antibiotics: Bactrim for 7 days initially and then Macrobid for 2 weeks, with no significant improvement. His primary care physician has referred him to see urology, and the patient has been following up with urology, Dr. Yves Dill, as an outpatient for this resistant UTI. The patient was given IV Zosyn in the ED. Chest x-ray has revealed pneumonia with some pulmonary edema. The patient sees Dr. Rockey Situ as an outpatient regarding his congestive heart failure and he is on 1200 mL fluid restriction and Dr. Rockey Situ has increased his Lasix dose last Friday. In the ED, his renal function is abnormal, with creatinine elevated at 3.69. Hemoglobin dropped from 13 in February 2016 to 9.1 on today's CBC. The patient denies any bleeding, bruising, or blood loss. No other complaints.   PAST MEDICAL HISTORY: History of congestive heart failure, hypertension, looks like he has a history of diabetes mellitus, a history of CVA in the year 2009.   PAST SURGICAL HISTORY: Laparoscopic cholecystectomy, laparoscopic repair of bilateral hernias.   ALLERGIES: AMBIEN, CORTISONE, LEVAQUIN, ROCEPHIN.  PSYCHOSOCIAL HISTORY: Lives at home with wife. No smoking, alcohol, or illicit drug usage.   FAMILY HISTORY: Heart conditions run in his family.   REVIEW OF SYSTEMS:  CONSTITUTIONAL: Complaining of fatigue, weakness, low-grade fever.  EYES: Denies blurry vision, double vision.  EARS, NOSE, THROAT: Denies epistaxis, discharge.   RESPIRATORY: Complaining of cough and shortness of breath,  CARDIOVASCULAR: No chest pain, palpitations.  GASTROINTESTINAL: Complaining of nausea. No vomiting, diarrhea. Complaining of lower abdominal pain.  GENITOURINARY: Complaining of dysuria and foul-smelling urine. No hematuria.  ENDOCRINE: Denies polyuria, nocturia. Has history of diabetes mellitus. HEMATOLOGIC AND LYMPHATIC: Has anemia. No easy bruising, bleeding.  INTEGUMENTARY: No acne, rash, lesions.  MUSCULOSKELETAL: No joint pain in the neck and back. Denies any gout.  NEUROLOGIC: Denies any vertigo, ataxia. Has old history of stroke.  PSYCHIATRIC: No ADD or OCD.   HOME MEDICATIONS: Acetaminophen with hydrocodone 750/7.5 mg p.o. 4 times a day as needed for pain, allopurinol 100 mg 1 tablet p.o. 2 times a day, Lyrica 75 mg 1 capsule p.o. 2 times a day, metoprolol succinate 25 mg extended release 2 times a day, multivitamin 1 tablet p.o. once daily, Norvasc 5 mg p.o. 2 times a day, Plavix 75 mg once daily, vitamin A 200 international units 1 capsule p.o. once daily, Ambien 10 mg p.o. once daily.   PHYSICAL EXAMINATION:  VITAL SIGNS: Temperature 99.2, pulse 83, respirations 18 to 20, blood pressure 107/95, pulse oximetry 99%. GENERAL APPEARANCE: Not in any acute distress. Moderately built and obese.  HEENT: Normocephalic, atraumatic. Pupils are equally reacting to light and accommodation. No scleral icterus. No conjunctival injection. No sinus tenderness. No postnasal drip. Dry mucous membranes.  NECK: Supple. No JVD. No thyromegaly. Range of motion is intact.  LUNGS: Positive crackles.  CARDIAC: S1, S2 normal. Regular rate and rhythm.  GASTROINTESTINAL: Soft, obese. Bowel sounds are positive in all 4 quadrants. Nondistended, but tender in the lower abdominal area.  No rebound tenderness. No masses felt. NEUROLOGIC: Awake, alert, oriented x 3. Cranial nerves II through XII are grossly intact. Motor and sensory are intact. Reflexes are  2+.  EXTREMITIES: No edema. No cyanosis. No clubbing.  SKIN: Warm to touch, dry in nature. No rashes. No lesions.  MUSCULOSKELETAL: No joint effusion, tenderness, or edema. PSYCHIATRIC: Flat mood and affect.  LABORATORY AND IMAGING STUDIES: WBC 5.0; hemoglobin 9.1, which was at 13.0 back in February 2016; hematocrit 28.0; platelets are 203,000. Urinalysis: Yellow in color, turbid in appearance, glucose 50 mg/dL, ketones negative, nitrite negative, leukocyte esterase 3+, WBC too numerous to count, WBC clumps are present. Glucose 194, BUN 64, creatinine 3.69. Sodium, potassium, chloride are normal. Anion gap is at 11. GFR is 15, calcium 8.5. Chest x-ray, portable: Cardiomegaly and bibasilar airspace disease, most compatible with mild CHF.   ASSESSMENT AND PLAN: A 74 year old male coming in to the ED with a chief complaint of 2-week history of abdominal pain and 3-day history of cough. The patient was treated with Bactrim for 7 days followed by [facility] for 2 weeks for acute cystitis with no improvement, and the patient was referred to outpatient urology and has been following up with Dr. Yves Dill.  1.  Acute lower abdominal pain with acute cystitis/acute pyelonephritis. I will admit him to medical floor. Failed outpatient antibiotics. We will get a urine culture and sensitivity. I will start him on intravenous Zosyn. Urology consult will be placed to Dr. Yves Dill. Will follow up on a urine culture and sensitivity and narrow down the broad-spectrum antibiotics after culture results are obtained. 2.  Healthcare-associated pneumonia. The patient was treated with 2 courses of p.o. antibiotics for urinary tract infection. Will treat him with Zosyn and azithromycin. The patient is allergic to levofloxacin.  3.  Chronic congestive heart failure with mild pulmonary edema. We will hold off on his Lasix. The patient is not in shortness of breath and no jugular venous distention noticed. 4.  Acute kidney injury. The  patient's Lasix dose was increased by Dr. Rockey Situ, so probably it is from acute tubular necrosis. The patient is also on 1200 mL fluid restriction in view of his congestive heart failure. For now we will hold off on his Lasix, avoid nephrotoxins, and recheck BMP in the morning. If necessary, we will put in nephrology consult as the patient sees nephrology as an outpatient. 5.  Acute anemia. Hemoglobin dropped from 13 in February 2016 to 9.1. No acute bleeding or bruising. Will check stool for Hemoccult. If this is normocytic anemia, this can be from anemia of chronic kidney disease, which has worsened recently. 6.  Hypertension. Blood pressure is stable. Resume his home medications and avoid nephrotoxins. 7.  We will provide him gastrointestinal and deep vein thrombosis prophylaxis.  CODE STATUS: He is "do not resuscitate." Wife is the medical power-of-attorney.  Plan of care discussed with the patient. He verbalized understanding of the plan.  TOTAL TIME SPENT ON ADMISSION: 50 minutes.    ____________________________ Nicholes Mango, MD ag:ST D: 08/01/2014 46:56:81 ET T: 08/01/2014 22:28:49 ET JOB#: [id number]  cc: Nicholes Mango, MD, <Dictator> Richard L. Rosanna Randy, MD Minna Merritts, MD Nicholes Mango MD ELECTRONICALLY SIGNED 08/18/2014 14:27

## 2014-08-08 NOTE — Discharge Summary (Addendum)
Physician Discharge Summary  Darrell Richardson TDV:[id number] DOB: [birth year] DOA: 08/01/2014  PCP: Vernie Murders E, PA  Admit date: 08/01/2014 Discharge date: 08/08/2014  Time spent: 50 minutes  Recommendations for Outpatient Follow-up:  1. Dr Ola Spurr infectious disease one week 2. Home health nursing for picc line care 3. [facility] two weeks  Discharge Diagnoses:  Active Problems:   CHF (congestive heart failure), NYHA class I   Discharge Condition: stable  Diet recommendation: Low sodium 1800 ADA diet  Filed Weights   08/06/14 1249 08/08/14 0500  Weight: 101.4 kg (223 lb 8.7 oz) 101.424 kg (223 lb 9.6 oz)    History of present illness:  Patient presented with abdominal pain and dysuria and had a recent Foley catheter.  Hospital Course:  #1 acute cystitis. Urine culture grew out ESBL Escherichia coli antibiotics were switched to IV Invanz a total of 14 day course as per Dr. Ola Spurr. A PICC line was placed and patient will be sent home with home health for PICC line care and infusion of IV antibiotics. The patient's wife was also trained on infusion of antibiotics. Patient has improved from admission. #2 fluid overload. The patient was given IV fluids during the entire hospital course secondary to acute renal failure on chronic kidney disease. On 08/07/2014, the patient had shortness of breath. The patient's IV fluids were stopped and IV Lasix was given. On 08/08/2014 the patient's lungs were clear and his oral Lasix regimen was resumed. #3 acute renal failure on chronic kidney disease. This had improved. The patient's creatinine upon discharge is 1.85. #4 chronic diastolic congestive heart failure. The patient did well with this up until the day before discharge when Lasix had to be restarted and IV fluids needed to be stopped. #5 acute anemia. This was a dilutional with IV fluid hydration iron prescribed #6 hypertension essential continue on Norvasc and Coreg. #7  diabetes type 2 controlled with chronic kidney disease. Hemoglobin A1c 6.1. Can continue Januvia #8 coronary artery disease on Plavix  Procedures:  PICC line placement  Consultations:  [facility]  Infectious disease with Dr. Ola Spurr  Discharge Exam: Filed Vitals:   08/08/14 0830  BP: 133/73  Pulse: 70  Temp: 98 F (36.7 C)  Resp: 22    Physical Exam  HENT:  Nose: No mucosal edema.  Mouth/Throat: No oropharyngeal exudate or posterior oropharyngeal edema.  Eyes: Conjunctivae, EOM and lids are normal. Pupils are equal, round, and reactive to light.  Neck: No JVD present. Carotid bruit is not present. No edema present. No thyroid mass and no thyromegaly present.  Cardiovascular: S1 normal and S2 normal.  Exam reveals no gallop.   No murmur heard. Pulses:      Dorsalis pedis pulses are 2+ on the right side, and 2+ on the left side.  Respiratory: No respiratory distress. He has no wheezes. He has no rhonchi. He has no rales.  GI: Soft. Bowel sounds are normal. There is no tenderness.  Musculoskeletal:       Right shoulder: He exhibits no swelling.  Lymphadenopathy:    He has no cervical adenopathy.  Neurological: He is alert. No cranial nerve deficit.  Skin: Skin is warm. No rash noted. Nails show no clubbing.  Psychiatric: He has a normal mood and affect.    Discharge Instructions   Discharge Instructions    Discharge patient    Complete by:  As directed           Current Discharge Medication List  START taking these medications   Details  amLODipine (NORVASC) 5 MG tablet Take 1 tablet (5 mg total) by mouth 2 (two) times daily. Qty: 60 tablet, Refills: 0    ertapenem 1 g in sodium chloride 0.9 % 50 mL Inject 1 g into the vein daily. Qty: 11 g, Refills: 0    ferrous sulfate 325 (65 FE) MG tablet Take 1 tablet (325 mg total) by mouth every morning. Qty: 30 tablet, Refills: 0    sodium chloride 0.9 % infusion Inject 5 mLs into the vein 2 (two) times  daily. Qty: 120 mL, Refills: 0      CONTINUE these medications which have NOT CHANGED   Details  Calcium Carbonate-Vitamin D (CALCIUM-VITAMIN D) 500-200 MG-UNIT per tablet Take 1 tablet by mouth daily.    acidophilus (RISAQUAD) CAPS capsule Take 2 capsules by mouth daily. Qty: 30 capsule, Refills: 0    allopurinol (ZYLOPRIM) 100 MG tablet Take 1 tablet (100 mg total) by mouth daily. Qty: 30 tablet, Refills: 1    carvedilol (COREG) 3.125 MG tablet Take 1 tablet (3.125 mg total) by mouth 2 (two) times daily with a meal. Qty: 60 tablet, Refills: 1    clopidogrel (PLAVIX) 75 MG tablet Take 1 tablet (75 mg total) by mouth daily with breakfast. Qty: 30 tablet, Refills: 1    docusate sodium (COLACE) 100 MG capsule Take 100 mg by mouth at bedtime.    furosemide (LASIX) 80 MG tablet Take 1 tablet (80 mg total) by mouth 2 (two) times daily as needed. Qty: 60 tablet, Refills: 6    HYDROcodone-acetaminophen (NORCO/VICODIN) 5-325 MG per tablet Take 1-2 tablets by mouth every 4 (four) hours as needed for moderate pain. Qty: 90 tablet, Refills: 0    JANUVIA 50 MG tablet 1 tablet daily.    Multiple Vitamin (MULTIVITAMIN) tablet Take 1 tablet by mouth daily.    NOVOLOG 100 UNIT/ML injection     pravastatin (PRAVACHOL) 20 MG tablet 1 tablet daily.    pregabalin (LYRICA) 75 MG capsule Take 1 capsule (75 mg total) by mouth daily. Qty: 30 capsule, Refills: 1      STOP taking these medications     Cholecalciferol (VITAMIN D3) 1000 UNITS CAPS      COLCRYS 0.6 MG tablet      LANTUS 100 UNIT/ML injection      linagliptin (TRADJENTA) 5 MG TABS tablet      methocarbamol (ROBAXIN) 750 MG tablet        Allergies  Allergen Reactions  . Cortisone Anaphylaxis and Other (See Comments)    Cold, elevated BP, shaking all over and fever  Wife says tolerates hydrocortisone cream at home  . Ambien [Zolpidem Tartrate]     Did not tolerate well or remembered anything from previous night  .  Rocephin [Ceftriaxone] Hives  . Levaquin [Levofloxacin In D5w] Rash      The results of significant diagnostics from this hospitalization (including imaging, microbiology, ancillary and laboratory) are listed below for reference.    Significant Diagnostic Studies: Ct Abdomen Pelvis Wo Contrast  08/02/2014   CLINICAL DATA:  Lower abdominal pain, acute on chronic kidney disease, anemia  EXAM: CT ABDOMEN AND PELVIS WITHOUT CONTRAST  TECHNIQUE: Multidetector CT imaging of the abdomen and pelvis was performed following the standard protocol without IV contrast.  COMPARISON:  06/11/2014  FINDINGS: Lower chest:  Lung bases are essentially clear.  Cardiomegaly.  Hepatobiliary: Unenhanced liver is unremarkable.  Status post cholecystectomy. No intrahepatic or extrahepatic ductal dilatation.  Pancreas: Within normal limits.  Spleen: Within normal limits.  Adrenals/Urinary Tract: Adrenal glands are within normal limits.  Kidneys are notable for nonspecific stranding. Punctate nonobstructing bilateral renal calculi, measuring 1-2 mm in the right upper and lower poles (coronal images 92 and 100) and 1-2 mm in the left interpolar region (coronal image 89) and left lower pole (coronal images 74-75).  No ureteral or bladder calculi.  Thick-walled bladder.  Stomach/Bowel: Stomach is within normal limits.  No evidence of bowel obstruction.  Normal appendix.  Sigmoid diverticulosis, without evidence of diverticulitis.  Vascular/Lymphatic: Atherosclerotic calcifications of the abdominal aorta and branch vessels.  No suspicious abdominopelvic lymphadenopathy.  Reproductive: Prostatomegaly.  Other: No abdominopelvic ascites.  Moderate fat containing right inguinal hernia.  Fat within the left inguinal canal.  Musculoskeletal: Degenerative changes of the visualized thoracolumbar spine.  IMPRESSION: Bilateral nonobstructing renal calculi measuring up to 2 mm. No ureteral or bladder calculi. Mildly thick-walled bladder.  No  evidence of bowel obstruction. Normal appendix. Colonic diverticulosis, without evidence of diverticulitis.  Moderate fat containing right inguinal hernia.   Electronically Signed   By: Julian Hy M.D.   On: 08/02/2014 18:51   Dg Chest Port 1 View  08/07/2014   CLINICAL DATA:  Acute onset of shortness of breath. Initial encounter.  EXAM: PORTABLE CHEST - 1 VIEW  COMPARISON:  Chest radiograph performed 08/06/2014  FINDINGS: The lungs are well-aerated. Vascular congestion is noted, with bibasilar airspace opacities, concerning for mild interstitial edema. There is no evidence of pleural effusion or pneumothorax.  The cardiomediastinal silhouette is borderline normal in size. No acute osseous abnormalities are seen. A right PICC is noted ending about the distal SVC.  IMPRESSION: Vascular congestion, with bibasilar airspace opacities, concerning for mild interstitial edema.   Electronically Signed   By: Garald Balding M.D.   On: 08/07/2014 02:50   Dg Chest Port 1 View  08/06/2014   CLINICAL DATA:  PICC line placement.  EXAM: PORTABLE CHEST - 1 VIEW  COMPARISON:  08/01/2014  FINDINGS: Right PICC line is in place. The tip is at the cavoatrial junction. Heart is mildly enlarged. Mild vascular congestion. Mild bibasilar atelectasis, improving since prior study. No effusions. No acute bony abnormality. Degenerative changes in the shoulders bilaterally.  IMPRESSION: Right PICC line tip at the cavoatrial junction.  Improving bibasilar atelectasis.  Mild cardiomegaly, vascular congestion.   Electronically Signed   By: Rolm Baptise M.D.   On: 08/06/2014 10:50   Dg Chest Port 1 View  08/01/2014   CLINICAL DATA:  Dyspnea/short of breath.  EXAM: PORTABLE CHEST - 1 VIEW  COMPARISON:  06/06/2014.  FINDINGS: Cardiopericardial silhouette is enlarged. Perihilar and basilar opacity is present, most compatible with pulmonary edema and volume overload. Bilateral basilar atelectasis is present. Aortic arch atherosclerosis.  Mediastinal contours are unchanged. Monitoring leads project over the chest.  IMPRESSION: Cardiomegaly and basilar airspace disease most compatible with mild CHF.   Electronically Signed   By: Dereck Ligas M.D.   On: 08/01/2014 17:26   US Kidneys ([facility] Hx)  08/02/2014   CLINICAL DATA:  Acute renal failure  EXAM: RENAL / URINARY TRACT ULTRASOUND COMPLETE  COMPARISON:  None.  FINDINGS: Right Kidney:  Length: 13.0 cm. 1.8 x 1.4 x 1.5 cm upper pole renal cyst. No hydronephrosis.  Left Kidney:  Length: 12.4 cm.  No mass or hydronephrosis.  Bladder:  Within normal limits.  IMPRESSION: 1.8 cm right upper pole renal cyst.  No hydronephrosis.   Electronically Signed  By: Julian Hy M.D.   On: 08/02/2014 17:09    Microbiology: Recent Results (from the past 240 hour(s))  Culture, blood (single)     Status: None   Collection Time: 08/01/14  4:22 PM  Result Value Ref Range Status   Micro Text Report   Final       COMMENT                   NO GROWTH AEROBICALLY/ANAEROBICALLY IN 5 DAYS   ANTIBIOTIC                                                      Urine culture     Status: None   Collection Time: 08/01/14  5:12 PM  Result Value Ref Range Status   Micro Text Report   Final       SOURCE: CLEAN CATCH    ORGANISM 1                >100,000 CFU/ML Escherichia coli   COMMENT                   ESBL (Extended Spectrum Beta Lactamase)   COMMENT                   -   ANTIBIOTIC                    ORG#1     AMPICILLIN                    R         CEFAZOLIN                     R         CEFOXITIN                     S         CEFTRIAXONE                   R         CIPROFLOXACIN                 R         ERTAPENEM                     S         GENTAMICIN                    S         IMIPENEM                      S         LEVOFLOXACIN                  R         NITROFURANTOIN                S         ESBL                          POSITIVE  Trimethoprim/Sulfamethoxazole R  Culture, blood (single)     Status: None   Collection Time: 08/01/14  6:37 PM  Result Value Ref Range Status   Micro Text Report   Final       COMMENT                   NO GROWTH AEROBICALLY/ANAEROBICALLY IN 5 DAYS   ANTIBIOTIC                                                         Labs: Basic Metabolic Panel:  Recent Labs Lab 08/04/14 0449 08/05/14 0557 08/06/14 1038 08/07/14 0318 08/08/14 0409  NA  --   --   --   --  139  K  --   --   --   --  4.0  CL  --   --   --   --  111  CO2 22 21* 21* 21* 21*  GLUCOSE  --   --   --   --  117*  BUN 45* 39* 33* 29* 26*  CREATININE 2.73* 2.53* 2.09* 1.93* 1.85*  CALCIUM 7.8* 7.9* 8.4* 8.5* 8.6*   CBC:  Recent Labs Lab 08/01/14 1622 08/02/14 0548 08/03/14 0525 08/04/14 0449 08/08/14 0409  WBC 5.0 4.1 5.3  --   --   NEUTROABS 3.2 2.2 3.4  --   --   HGB 9.1* 8.3* 8.6* 8.1*  --   HCT 28.0* 24.9* 26.4*  --   --   MCV 88 89 88  --   --   PLT 203 165 185  --  256    CBG:  Recent Labs Lab 08/08/14 0823  GLUCAP 148*    PICC line can be removed by home health agency wants IV Colbert Ewing course is completed.   SignedLoletha Grayer  08/08/2014, 8:39 AM

## 2014-08-08 NOTE — Consult Note (Addendum)
PATIENT NAME:  Darrell Richardson, Darrell Richardson MR#:  [id number] DATE OF BIRTH:  18-Apr-1940  DATE OF CONSULTATION:  08/03/2014  REFERRING PHYSICIAN:   CONSULTING PHYSICIAN:  Janice Coffin. Elnoria Howard, DO  UROLOGY IMPRESSION:  Cystitis without obstruction, calculi or other cause of infection.   The patient states it is one of his first infections he has ever had and he does know where it came from. Review of his CAT scan reveals no enlargement of his prostate. No obstructive signs or symptoms. No hydronephrosis, and basically a thickening of the bladder wall as an effect of cystitis.  Plan is to give this patient 2 months of antibiotic outpatient once a day, usually I use Bactrim or Macrobid. I would recommend Bactrim. He states he has no antecedent reasons for this infection including no pneumonia, recent flu, dental extraction, history of calculi, or infection of the testicles or epididymis.    UROLOGIC  PHYSICAL EXAMINATION: On this patient revealed  slightly enlarged testicles  bilaterally without hydrocele or spermatocele. Penis is normal, circumcised. There are no hernias. Rectal exam reveals a very small prostate in absolute physical conjunction with the size  of the prostate seen on CAT scan. He does have some ambulatory problems. He has had some weakness in both legs.    The history of this patient is as is with his H and P with no antecedent urologic procedures or problems.   PHYSICAL EXAMINATION: VITAL SIGNS: Is otherwise unchanged from his original physical exam.   Thank you very much for allowing me to see this patient and I will follow him up in the office for a cystoscopy. I have told the patient that he needs to come over and I will do the cystoscopy in the office to make sure there is no stricture disease. We will also get postvoid residual to make sure the patient is sufficiently emptying his bladder.  Since he has urologic, metabolic, or neurologic diseases to indicate problems with his bladder, I would  suspect that this is an isolated event.    ____________________________ Janice Coffin. Elnoria Howard, [place] rdh:tr D: 08/03/2014 13:13:48 ET T: 08/03/2014 13:30:44 ET JOB#: [id number]  cc: Janice Coffin. Elnoria Howard, DO, <Dictator> RICHARD D HART DO ELECTRONICALLY SIGNED 08/30/2014 15:14

## 2014-08-08 NOTE — Care Management Note (Signed)
Case Management Note  Patient Details  Name: Darrell Richardson MRN: [id number] Date of Birth: 01/21/1941  Subjective/Objective:       Discharge Planning             Action/Plan:     Referral for RN for IV Ertapenem called to [facility] and faxed to [place]. Next Ertapenem dose due 08/09/2014.  Referral for IV Entrapenem  faxed and called to [place] at [facility] . Referral for Home Health PT and RN called and faxed to Sherian Rein at [place].    Expected Discharge Date:     08/08/2014             Expected Discharge Plan:     In-House Referral:     Discharge planning Services     Post Acute Care Choice:    Choice offered to:     DME Arranged:    DME Agency:     HH Arranged:    HH Agency:     Status of Service:     Medicare Important Message Given:    Date Medicare IM Given:    Medicare IM give by:    Date Additional Medicare IM Given:    Additional Medicare Important Message give by:     If discussed at [place] of Stay Meetings, dates discussed:    Additional Comments:  Devetta Hagenow A, RN 08/08/2014, 11:57 AM

## 2014-08-08 NOTE — Progress Notes (Signed)
Talked to Dr. Earleen Newport regarding pt dose of ertapenem antix scheduled for this evening. To ensure pt receives dose, order to give now prior to d/c. Pt's home health delivery of antix will start tomorrow set up by care management.

## 2014-08-08 NOTE — Consult Note (Addendum)
PATIENT NAME:  Darrell Richardson, Darrell Richardson MR#:  [id number] DATE OF BIRTH:  28-Aug-1940  DATE OF CONSULTATION:  08/05/2014  REFERRING PHYSICIAN:  Dr. Bridgett Larsson CONSULTING PHYSICIAN:  Cheral Marker. Ola Spurr, MD  REASON FOR CONSULTATION: ESBL Escherichia coli.   HISTORY OF PRESENT ILLNESS: This is a pleasant 74 year old gentleman with history of chronic kidney disease as well as congestive heart failure, diabetes, CVA, and hypertension. He was admitted April 24 with abdominal pain. He had had recurrent abdominal pain and was diagnosed with urinary tract infection for the last month to 2. He was treated with several courses of antibiotics including Bactrim and Macrobid. He had been referred to see Dr. Yves Dill in urology. On admission, he was found to have acute on chronic renal failure as well as anemia. He also had positive urinalysis. He was initially treated with IV azithromycin and Zosyn for potential pneumonia and urinary infection. However, when the organism came back as ESBL Escherichia coli on April 26, he was started on ertapenem. We are consulted for further antibiotic recommendations. He has been seen by Dr. Elnoria Howard, who recommends he will need approximately 2 months of treatment with followup cystoscopy. PE. This was prior to knowing the organism.   On my discussion with the patient and his wife, he had never had a urinary tract infection before this episode. However, in February he was admitted at [facility] for acute on chronic kidney failure and CHF exacerbation. He did have a Foley catheter placed at that time. That seems to be when the problem started.   Since admission, the patient is somewhat confused and appears quite dehydrated.   PAST MEDICAL HISTORY:  1.  CHF. 2.  Hypertension.  3.  Chronic kidney disease.  4.  Diabetes.  5.  CVA.   PAST SURGICAL HISTORY: Laparoscopic cholecystectomy and laparoscopic repair of bilateral hernias.   SOCIAL HISTORY: Lives with his wife. No tobacco, alcohol or  drugs; relatively functioning.   FAMILY HISTORY: Noncontributory.   ALLERGIES: AMBIEN, CORTISONE, LEVAQUIN, AND ROCEPHIN.   REVIEW OF SYSTEMS: Eleven systems reviewed and negative except as per HPI. Antibiotics since admission included Zosyn and azithromycin. Currently, he is on ertapenem 1 gram q. 24 since April 26.   PHYSICAL EXAMINATION:  VITAL SIGNS: Temperature 97.6, pulse 63, blood pressure 137/75, respirations 18, saturation 96% on room air.  GENERAL: He is disheveled. He is obese. He is lying in bed, somewhat confused.  HEENT: Pupils reactive. Sclerae anicteric. His oropharynx, mucous membranes are quite dry.  NECK: Supple.  HEART: Regular.  LUNGS: Clear.  LYMPHATICS: No anterior cervical, posterior cervical or supraclavicular lymphadenopathy.  ABDOMEN: Obese, mildly distended, mildly tender to palpation in the lower quadrant.  EXTREMITIES: No clubbing, cyanosis, or edema.  NEUROLOGIC: He is somewhat confused, appears quite dehydrated. He is able to move all 4 extremities. Grossly nonfocal neurological examination.   DATA: Micro urine culture done April 22 growing greater than 100,000 Escherichia coli which is an ESBL producer, sensitive to nitrofurantoin, gentamicin, imipenem, and ertapenem and cefoxitin. It is resistant to other antibiotics. Blood cultures x2 April 24 are negative. Urinalysis on admission April 24 had too numerous to count white cells, too numerous to count red cells, white blood cell clumps were seen.   IMAGING: CT of the abdomen and pelvis without contrast April 25 showed bilateral nonobstructing renal calculi measuring up to 2 mm. There is no urethral or bladder calculi. There is mildly thick-walled bladder. No evidence of bowel obstruction. There is normal appendix, colonic diverticulitis  without evidence of diverticulitis, some moderate fat-containing right inguinal hernia. Ultrasound of the kidneys, April 25 showed 1.8 cm right upper pole renal cyst. Chest x-ray  April 24, showed CHF and bibasilar airspace disease, most compatible with CHF. White count on admission, April 24, was 5.0. Hemoglobin was 9.1, currently 8.1. Renal function on admission had a BUN 64, creatinine 3.69 on April 27 it was 45/2.73.   IMPRESSION: A 74 year old gentleman with history of congestive heart failure, chronic kidney disease, recent stroke, who was admitted in February to [facility] where he had a Foley catheter in place for congestive heart failure exacerbation and acute on chronic renal failure. Since that time, he has had recurrent urinary symptoms. As an outpatient, he has been treated with several antibiotics without success. He was admitted with abdominal pain and acute on chronic renal failure. He has too numerous to count white cells and white blood cell clumps in his urine and his urine culture is growing ESBL Escherichia coli. Clinically, he is not yet stabilized. He remains quite dry. He has had an endoscopy and colonoscopy.   I suspect he developed the infection after his admission at [facility]. He has never had a urinary infection or other urological problems in the past. I discussed with the wife the option of intramuscular or IV ertapenem 1 gram twice a day to complete a total 14 day course. There are in the doughnut hole, in terms of their medication coverage, and this will be quite an expensive treatment for them. I have asked social work to consult with this.   The patient reports that he would not want dialysis, even if he progressed to end-stage renal disease. We discussed placing a PICC and I think this would be the best way to deliver the ertapenem IV. I have ordered a PICC line placement.   I have asked the microbiology lab to add on sensitivities for fosfomycin. If it is sensitive to this, he could be changed as an outpatient if he needs a prolonged course to fosfomycin. However, the fosfomycin is also quite expensive and they are in the doughnut hole.   The patient  clinically seems quite confused and I do not think he has had time to respond to the ertapenem, as it was just recently started. Would suggest he likely needs at least 2 to 3 more days of monitoring of his renal function as well as his response to infection.   Thank you for the consult. I will be glad to follow with you.    ____________________________ Cheral Marker. Ola Spurr, MD dpf:TM D: 08/05/2014 15:34:00 ET T: 08/05/2014 16:12:23 ET JOB#: [id number]  cc: Cheral Marker. Ola Spurr, MD, <Dictator> Aurther Harlin Ola Spurr MD ELECTRONICALLY SIGNED 08/10/2014 10:24

## 2014-08-08 NOTE — Consult Note (Signed)
PATIENT NAME:  Darrell Richardson, CHEUVRONT MR#:  [id number] DATE OF BIRTH:  [birth year]  DATE OF CONSULTATION:  08/02/2014.  REFERRING PHYSICIAN:  Dr. Bridgett Larsson.  CONSULTING PHYSICIAN:  Robert Bellow, MD.  INDICATION FOR CONSULTATION:  New-onset anemia.  HISTORY OF PRESENT ILLNESS:  This 74 year old male was admitted yesterday with a question of pneumonia, stable-to-worsening congestive heart failure, and lower abdominal pain thought secondary to acute-on-chronic cystitis.  The patient was noted on admission to have had a change in his hemoglobin as well as worsening of his renal function.  The patient is a longstanding diabetic and has had evidence of renal compromise dating back at least 3 years.  He was hospitalized in March 2016 at [facility] and then went to rehabilitation for a week.   The patient's hemoglobin in 2003 was 17.  In February 2016, it was 13.6 with an MCV in the 80s.  White blood cell count and CBC at that time were normal.  His creatinine in February was 2.85 with an estimated GFR of 21.  On April 24, his creatinine was 3.69 with a BUN of 64.  At this time, his hemoglobin had fallen to 9.1 and today it had fallen to 8.3.  His MCV remains in the high 80s.  During this time, urinalysis has shown too numerous to count white blood cells and red blood cells.  The patient denies seeing any blood per rectum.  He reports infrequent bowel movements, every 3-4 days.  He is accompanied today by his wife, Drexler Maland, who provides a fair amount of the history.   The patient had undergone a cholecystectomy in 2001.  In 2002, he underwent emergency surgery for duodenal ulcer bleeding at [facility].  In 2003, he underwent laparoscopic repair of incisional hernias present at both ends of the subcostal Chevron incision.  In 2002, lower GI barium study showed evidence of diverticulosis.   PAST MEDICAL HISTORY:  Notable for a CVA in 2009, lower extremity ischemia evaluated by the vascular service  the same year, and renal failure beginning in 2013.   MEDICATIONS AT THE TIME OF ADMISSION:  Included Plavix 75 mg daily, Tradjenta 5 mg daily, methocarbamol 750 mg tablets 2 p.o. q. 6h. p.r.n., Lyrica 75 mg daily, Lasix 80 mg daily, dulcolax as needed, colchicine 0.6 mg as needed, carvedilol 3.125 mg b.i.d., calcium supplement daily, allopurinol 100 mg daily, acidophilus daily, and acetaminophen/hydrocodone 1-2 tablets q. 4h. p.r.n. for pain.   MEDICAL ALLERGIES:  INCLUDE CORTISONE, AMBIEN, ROCEPHIN, AND LEVAQUIN.   SOCIAL HISTORY:  The patient is retired.  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   PHYSICAL EXAMINATION: GENERAL:  Showed the patient to be awake, alert, and orientated.  He cooperated with his exam.  Weight is down about 35 pounds from values earlier in 2003.  CHEST:  Clear breath sounds bilaterally.  CARDIAC:  Regular rhythm without murmur or gallop.  ABDOMEN:  Obese, soft, and nontender.  There is firmness at the lateral margin of the right Chevron incision without tenderness, erythema, or induration.  No other abdominal masses are noted.  While the patient reports he had presented with lower abdominal pain, none is evident on today's exam.  EXTREMITIES:  Femoral pulses are intact.  No significant peripheral edema.   LABORATORY STUDIES:  Summarized above with a hemoglobin of 13.3 on February 22, 9.1 on April 24, and 8.3 today.  Normal MCV noted.   Chest x-ray obtained yesterday showed cardiomegaly and basilar  airspace disease, most compatible with mild CHF.  Renal ultrasound showed stable renal cyst.  Minimal decrease in renal size compared to 2013 CT.  CT of the abdomen and pelvis showed bilateral nonobstructing renal calculi.  No evidence of bowel obstruction.  Normal appendix.  Colonic diverticulosis.  Fat-containing inguinal hernia.  Previous CTs had suggested bilateral fat-containing inguinal hernias.   CT scan of the head dated 03/25/2014 showed stable atrophy  with mild small vessel changes.  No acute changes.  CT scan of the head dated 08/13/2013 showed no acute intracranial process. Mild involutional changes.   The patient has had a fairly significant fall in his hemoglobin over the last 2 months, more so than would be expected based on his well-known and chronic renal failure.  This does not appear to be all dilutional based on today's clinical exam.   He is a candidate for upper and lower endoscopy, especially considering his history of bleeding duodenal ulcer in 2001.  He is on Plavix, and ideally this would be stopped for a week prior to elective endoscopy.  Considering the profound change in hemoglobin, falling 4 points, in a patient with chronic congestive heart failure and renal insufficiency, at this time the risk for completing endoscopy while on antiplatelet therapy is outweighed by the potential to identify the source of bleeding and potentially correct it.   Plans for endoscopy were reviewed with the patient and his wife.  Case was reviewed with Dr. Bridgett Larsson in person.  Will plan to proceed to endoscopy tomorrow.    ____________________________ Robert Bellow, MD jwb:kc D: 08/02/2014 20:39:05 ET T: 08/02/2014 21:33:19 ET JOB#: [id number]  cc: Robert Bellow, MD, <Dictator> Richard L. Rosanna Randy, MD Niara Bunker Amedeo Kinsman MD ELECTRONICALLY SIGNED 08/03/2014 7:46

## 2014-08-11 ENCOUNTER — Ambulatory Visit: Payer: Medicare Other | Admitting: Family

## 2014-08-11 ENCOUNTER — Other Ambulatory Visit: Payer: Self-pay

## 2014-08-11 ENCOUNTER — Inpatient Hospital Stay
Admission: EM | Admit: 2014-08-11 | Discharge: 2014-08-13 | DRG: 871 | Disposition: A | Discharge status: Hospice | Payer: Medicare Other | Attending: Internal Medicine | Admitting: Internal Medicine

## 2014-08-11 ENCOUNTER — Encounter: Payer: Self-pay | Admitting: *Deleted

## 2014-08-11 ENCOUNTER — Emergency Department: Payer: Medicare Other

## 2014-08-11 DIAGNOSIS — Z881 Allergy status to other antibiotic agents status: Secondary | ICD-10-CM

## 2014-08-11 DIAGNOSIS — D631 Anemia in chronic kidney disease: Secondary | ICD-10-CM | POA: Diagnosis present

## 2014-08-11 DIAGNOSIS — F419 Anxiety disorder, unspecified: Secondary | ICD-10-CM | POA: Diagnosis present

## 2014-08-11 DIAGNOSIS — N170 Acute kidney failure with tubular necrosis: Secondary | ICD-10-CM | POA: Diagnosis present

## 2014-08-11 DIAGNOSIS — Z79899 Other long term (current) drug therapy: Secondary | ICD-10-CM

## 2014-08-11 DIAGNOSIS — H9190 Unspecified hearing loss, unspecified ear: Secondary | ICD-10-CM | POA: Diagnosis present

## 2014-08-11 DIAGNOSIS — I129 Hypertensive chronic kidney disease with stage 1 through stage 4 chronic kidney disease, or unspecified chronic kidney disease: Secondary | ICD-10-CM | POA: Diagnosis present

## 2014-08-11 DIAGNOSIS — E785 Hyperlipidemia, unspecified: Secondary | ICD-10-CM | POA: Diagnosis present

## 2014-08-11 DIAGNOSIS — A419 Sepsis, unspecified organism: Principal | ICD-10-CM | POA: Diagnosis present

## 2014-08-11 DIAGNOSIS — I429 Cardiomyopathy, unspecified: Secondary | ICD-10-CM | POA: Diagnosis present

## 2014-08-11 DIAGNOSIS — N184 Chronic kidney disease, stage 4 (severe): Secondary | ICD-10-CM | POA: Diagnosis present

## 2014-08-11 DIAGNOSIS — I5043 Acute on chronic combined systolic (congestive) and diastolic (congestive) heart failure: Secondary | ICD-10-CM | POA: Diagnosis not present

## 2014-08-11 DIAGNOSIS — R5383 Other fatigue: Secondary | ICD-10-CM | POA: Diagnosis not present

## 2014-08-11 DIAGNOSIS — I2510 Atherosclerotic heart disease of native coronary artery without angina pectoris: Secondary | ICD-10-CM | POA: Diagnosis present

## 2014-08-11 DIAGNOSIS — M25511 Pain in right shoulder: Secondary | ICD-10-CM

## 2014-08-11 DIAGNOSIS — I10 Essential (primary) hypertension: Secondary | ICD-10-CM | POA: Diagnosis present

## 2014-08-11 DIAGNOSIS — N12 Tubulo-interstitial nephritis, not specified as acute or chronic: Secondary | ICD-10-CM | POA: Diagnosis present

## 2014-08-11 DIAGNOSIS — N189 Chronic kidney disease, unspecified: Secondary | ICD-10-CM

## 2014-08-11 DIAGNOSIS — I4729 Other ventricular tachycardia: Secondary | ICD-10-CM

## 2014-08-11 DIAGNOSIS — Z66 Do not resuscitate: Secondary | ICD-10-CM | POA: Diagnosis not present

## 2014-08-11 DIAGNOSIS — E1121 Type 2 diabetes mellitus with diabetic nephropathy: Secondary | ICD-10-CM | POA: Diagnosis present

## 2014-08-11 DIAGNOSIS — M609 Myositis, unspecified: Secondary | ICD-10-CM | POA: Diagnosis present

## 2014-08-11 DIAGNOSIS — I340 Nonrheumatic mitral (valve) insufficiency: Secondary | ICD-10-CM | POA: Diagnosis not present

## 2014-08-11 DIAGNOSIS — I5042 Chronic combined systolic (congestive) and diastolic (congestive) heart failure: Secondary | ICD-10-CM | POA: Diagnosis present

## 2014-08-11 DIAGNOSIS — Z515 Encounter for palliative care: Secondary | ICD-10-CM | POA: Diagnosis not present

## 2014-08-11 DIAGNOSIS — E1165 Type 2 diabetes mellitus with hyperglycemia: Secondary | ICD-10-CM | POA: Diagnosis present

## 2014-08-11 DIAGNOSIS — Z888 Allergy status to other drugs, medicaments and biological substances status: Secondary | ICD-10-CM

## 2014-08-11 DIAGNOSIS — IMO0002 Reserved for concepts with insufficient information to code with codable children: Secondary | ICD-10-CM | POA: Diagnosis present

## 2014-08-11 DIAGNOSIS — I472 Ventricular tachycardia, unspecified: Secondary | ICD-10-CM

## 2014-08-11 DIAGNOSIS — N179 Acute kidney failure, unspecified: Secondary | ICD-10-CM | POA: Diagnosis not present

## 2014-08-11 DIAGNOSIS — Z86010 Personal history of colonic polyps: Secondary | ICD-10-CM | POA: Diagnosis not present

## 2014-08-11 DIAGNOSIS — E119 Type 2 diabetes mellitus without complications: Secondary | ICD-10-CM

## 2014-08-11 DIAGNOSIS — Z794 Long term (current) use of insulin: Secondary | ICD-10-CM | POA: Diagnosis not present

## 2014-08-11 DIAGNOSIS — F329 Major depressive disorder, single episode, unspecified: Secondary | ICD-10-CM | POA: Diagnosis present

## 2014-08-11 DIAGNOSIS — I509 Heart failure, unspecified: Secondary | ICD-10-CM

## 2014-08-11 DIAGNOSIS — B9620 Unspecified Escherichia coli [E. coli] as the cause of diseases classified elsewhere: Secondary | ICD-10-CM | POA: Diagnosis present

## 2014-08-11 DIAGNOSIS — E1140 Type 2 diabetes mellitus with diabetic neuropathy, unspecified: Secondary | ICD-10-CM | POA: Diagnosis present

## 2014-08-11 DIAGNOSIS — Z8673 Personal history of transient ischemic attack (TIA), and cerebral infarction without residual deficits: Secondary | ICD-10-CM

## 2014-08-11 DIAGNOSIS — Z87891 Personal history of nicotine dependence: Secondary | ICD-10-CM

## 2014-08-11 DIAGNOSIS — M1990 Unspecified osteoarthritis, unspecified site: Secondary | ICD-10-CM | POA: Diagnosis present

## 2014-08-11 DIAGNOSIS — R531 Weakness: Secondary | ICD-10-CM | POA: Diagnosis not present

## 2014-08-11 DIAGNOSIS — R1310 Dysphagia, unspecified: Secondary | ICD-10-CM | POA: Diagnosis not present

## 2014-08-11 DIAGNOSIS — K1379 Other lesions of oral mucosa: Secondary | ICD-10-CM | POA: Diagnosis not present

## 2014-08-11 DIAGNOSIS — M79601 Pain in right arm: Secondary | ICD-10-CM

## 2014-08-11 DIAGNOSIS — M1000 Idiopathic gout, unspecified site: Secondary | ICD-10-CM | POA: Diagnosis present

## 2014-08-11 DIAGNOSIS — Z7902 Long term (current) use of antithrombotics/antiplatelets: Secondary | ICD-10-CM

## 2014-08-11 HISTORY — DX: Headache, unspecified: R51.9

## 2014-08-11 HISTORY — DX: Anxiety disorder, unspecified: F41.9

## 2014-08-11 HISTORY — DX: Unspecified osteoarthritis, unspecified site: M19.90

## 2014-08-11 HISTORY — DX: Chronic kidney disease, stage 4 (severe): N18.4

## 2014-08-11 HISTORY — DX: Acute myocardial infarction, unspecified: I21.9

## 2014-08-11 HISTORY — DX: Depression, unspecified: F32.A

## 2014-08-11 HISTORY — DX: Anemia, unspecified: D64.9

## 2014-08-11 HISTORY — DX: Major depressive disorder, single episode, unspecified: F32.9

## 2014-08-11 HISTORY — DX: Headache: R51

## 2014-08-11 LAB — BASIC METABOLIC PANEL
Anion gap: 13 (ref 5–15)
BUN: 36 mg/dL — ABNORMAL HIGH (ref 6–20)
CALCIUM: 9 mg/dL (ref 8.9–10.3)
CO2: 22 mmol/L (ref 22–32)
Chloride: 104 mmol/L (ref 101–111)
Creatinine, Ser: 2.76 mg/dL — ABNORMAL HIGH (ref 0.61–1.24)
GFR calc Af Amer: 24 mL/min — ABNORMAL LOW (ref >60)
GFR calc non Af Amer: 21 mL/min — ABNORMAL LOW (ref >60)
Glucose, Bld: 121 mg/dL — ABNORMAL HIGH (ref 65–99)
Potassium: 4.1 mmol/L (ref 3.5–5.1)
SODIUM: 139 mmol/L (ref 135–145)

## 2014-08-11 LAB — CBC
HCT: 29 % — ABNORMAL LOW (ref 40.0–52.0)
Hemoglobin: 9.7 g/dL — ABNORMAL LOW (ref 13.0–18.0)
MCH: 29.9 pg (ref 26.0–34.0)
MCHC: 33.6 g/dL (ref 32.0–36.0)
MCV: 88.9 fL (ref 80.0–100.0)
PLATELETS: 310 10*3/uL (ref 150–440)
RBC: 3.26 MIL/uL — ABNORMAL LOW (ref 4.40–5.90)
RDW: 17.2 % — AB (ref 11.5–14.5)
WBC: 7.2 10*3/uL (ref 3.8–10.6)

## 2014-08-11 LAB — URINALYSIS COMPLETE WITH MICROSCOPIC (ARMC ONLY)
BILIRUBIN URINE: NEGATIVE
Glucose, UA: 150 mg/dL — AB
KETONES UR: NEGATIVE mg/dL
Leukocytes, UA: NEGATIVE
Nitrite: NEGATIVE
SPECIFIC GRAVITY, URINE: 1.011 (ref 1.005–1.030)
pH: 5 (ref 5.0–8.0)

## 2014-08-11 LAB — TROPONIN I: Troponin I: 0.03 ng/mL (ref <0.031)

## 2014-08-11 MED ORDER — SODIUM CHLORIDE 0.9 % IJ SOLN
3.0000 mL | Freq: Two times a day (BID) | INTRAMUSCULAR | Status: DC
  Administered 2014-08-11 – 2014-08-13 (×3): 3 mL via INTRAVENOUS

## 2014-08-11 MED ORDER — DEXTROSE 5 % IV SOLN
60.0000 mg/h | Freq: Once | INTRAVENOUS | Status: AC
  Administered 2014-08-12: 60 mg/h via INTRAVENOUS

## 2014-08-11 MED ORDER — ONDANSETRON HCL 4 MG/2ML IJ SOLN: 4.0000 mg | Freq: Four times a day (QID) | INTRAMUSCULAR | Status: DC | PRN

## 2014-08-11 MED ORDER — ONDANSETRON HCL 4 MG PO TABS: 4.0000 mg | ORAL_TABLET | Freq: Four times a day (QID) | ORAL | Status: DC | PRN

## 2014-08-11 MED ORDER — MORPHINE SULFATE 2 MG/ML IJ SOLN
2.0000 mg | INTRAMUSCULAR | Status: DC | PRN
  Administered 2014-08-13 (×2): 2 mg via INTRAVENOUS
  Filled 2014-08-11 (×2): qty 1

## 2014-08-11 MED ORDER — ASPIRIN EC 81 MG PO TBEC
81.0000 mg | DELAYED_RELEASE_TABLET | Freq: Every day | ORAL | Status: DC
  Administered 2014-08-12 – 2014-08-13 (×2): 81 mg via ORAL
  Filled 2014-08-11 (×2): qty 1

## 2014-08-11 MED ORDER — AMIODARONE HCL IN DEXTROSE 360-4.14 MG/200ML-% IV SOLN
INTRAVENOUS | Status: AC
  Filled 2014-08-11: qty 200

## 2014-08-11 MED ORDER — ACETAMINOPHEN 325 MG PO TABS
650.0000 mg | ORAL_TABLET | Freq: Four times a day (QID) | ORAL | Status: DC | PRN
  Administered 2014-08-12 (×2): 650 mg via ORAL
  Filled 2014-08-11 (×2): qty 2

## 2014-08-11 MED ORDER — SODIUM CHLORIDE 0.9 % IV BOLUS (SEPSIS)
250.0000 mL | Freq: Once | INTRAVENOUS | Status: AC
  Administered 2014-08-11: 250 mL via INTRAVENOUS

## 2014-08-11 MED ORDER — AMIODARONE IV BOLUS ONLY 150 MG/100ML
150.0000 mg | Freq: Once | INTRAVENOUS | Status: AC
  Administered 2014-08-11: 150 mg via INTRAVENOUS

## 2014-08-11 MED ORDER — HEPARIN SODIUM (PORCINE) 5000 UNIT/ML IJ SOLN
5000.0000 [IU] | Freq: Three times a day (TID) | INTRAMUSCULAR | Status: DC
  Administered 2014-08-12 – 2014-08-13 (×4): 5000 [IU] via SUBCUTANEOUS
  Filled 2014-08-11 (×4): qty 1

## 2014-08-11 MED ORDER — ACETAMINOPHEN 650 MG RE SUPP: 650.0000 mg | Freq: Four times a day (QID) | RECTAL | Status: DC | PRN

## 2014-08-11 NOTE — ED Notes (Signed)
Attempted to call report at [phone]. Was informed the receiving nurse was tied up in a pt's room and would call for report when ready. This RNs ASCOM# given.

## 2014-08-11 NOTE — ED Provider Notes (Signed)
[facility] Emergency Department Provider Note    ____________________________________________  Time seen: On arrival to the emergency department  I have reviewed the triage vital signs and the nursing notes.   HISTORY  Chief Complaint Weakness; Arm Injury; Fatigue; and Fever       HPI Darrell Richardson is a 74 y.o. male history of CHF and renal failure with recent admission for Escherichia coli bacteremia presents today with persistent fever over the last 4 days. The patient says that he was discharged from [facility] last Thursday but has been having persistent low-grade fever weakness and fatigue. Per the paramedics he had a fever 100.4 at home prior to arrival this evening. He was given Tylenol. The patient denies no pain at this time. He is getting antibiotics daily through a PICC line in his right arm. He says that he has been having increased pain which is moderate to severe with movement. He says it has worsened since his PICC line was inserted. The patient is also complaining of dark urine which she said smells like "dead fish."     Past Medical History  Diagnosis Date  . Hearing loss   . Gout   . Diabetes   . Fatigue   . Hypertension   . Stroke     X 2  . Heart failure   . Muscle pain   . Hammertoe     Patient Active Problem List   Diagnosis Date Noted  . CHF (congestive heart failure), NYHA class I 08/07/2014  . CHF (congestive heart failure) 08/04/2014  . CAFL (chronic airflow limitation) 08/04/2014  . Arteriosclerosis of coronary artery 08/04/2014  . H/O: gout 08/04/2014  . Adiposity 08/04/2014  . Abnormal presence of protein in urine 08/04/2014  . History of colonic polyps 08/03/2014  . Hyperlipidemia 07/30/2014  . CAD in native artery   . Chronic combined systolic and diastolic heart failure 45/36/4680  . ESR raised 06/12/2014  . Elevated PSA 06/12/2014  . Debilitated 06/11/2014  . Palliative care encounter 06/09/2014  .  Myalgia   . Polyarthralgia   . Primary gout   . Secondary cardiomyopathy   . Diabetes type 2, uncontrolled   . History of CVA (cerebrovascular accident)   . CKD (chronic kidney disease), stage IV 05/31/2014  . Hypertension   . Acute MI 02/02/2014  . Benign neoplasm of skin 02/02/2014  . Stroke 02/02/2014  . Diabetes mellitus, type 2 02/02/2014  . Gout 02/02/2014  . Abdominal pain, lower 02/02/2014  . Avitaminosis D 02/02/2014    Past Surgical History  Procedure Laterality Date  . Stomach surgery    . Foot surgery Right   . Tee without cardioversion N/A 06/07/2014    Procedure: TRANSESOPHAGEAL ECHOCARDIOGRAM (TEE);  Surgeon: Jolaine Artist, MD;  Location: [facility] ENDOSCOPY;  Service: Cardiovascular;  Laterality: N/A;  . Cholecystectomy      Current Outpatient Rx  Name  Route  Sig  Dispense  Refill  . acidophilus (RISAQUAD) CAPS capsule   Oral   Take 2 capsules by mouth daily.   30 capsule   0   . allopurinol (ZYLOPRIM) 100 MG tablet   Oral   Take 1 tablet (100 mg total) by mouth daily.   30 tablet   1   . amLODipine (NORVASC) 5 MG tablet   Oral   Take 1 tablet (5 mg total) by mouth 2 (two) times daily.   60 tablet   0   . Calcium Carbonate-Vitamin D (CALCIUM-VITAMIN  D) 500-200 MG-UNIT per tablet   Oral   Take 1 tablet by mouth daily.         . carvedilol (COREG) 3.125 MG tablet   Oral   Take 1 tablet (3.125 mg total) by mouth 2 (two) times daily with a meal.   60 tablet   1   . clopidogrel (PLAVIX) 75 MG tablet   Oral   Take 1 tablet (75 mg total) by mouth daily with breakfast.   30 tablet   1   . docusate sodium (COLACE) 100 MG capsule   Oral   Take 100 mg by mouth at bedtime.         . ertapenem 1 g in sodium chloride 0.9 % 50 mL   Intravenous   Inject 1 g into the vein daily.   11 g   0   . ferrous sulfate 325 (65 FE) MG tablet   Oral   Take 1 tablet (325 mg total) by mouth every morning.   30 tablet   0   . furosemide (LASIX) 80 MG  tablet   Oral   Take 1 tablet (80 mg total) by mouth 2 (two) times daily as needed.   60 tablet   6   . HYDROcodone-acetaminophen (NORCO/VICODIN) 5-325 MG per tablet   Oral   Take 1-2 tablets by mouth every 4 (four) hours as needed for moderate pain.   90 tablet   0   . JANUVIA 50 MG tablet      1 tablet daily.           Dispense as written.   . Multiple Vitamin (MULTIVITAMIN) tablet   Oral   Take 1 tablet by mouth daily.         Marland Kitchen NOVOLOG 100 UNIT/ML injection                 Dispense as written.   . pravastatin (PRAVACHOL) 20 MG tablet      1 tablet daily.         . pregabalin (LYRICA) 75 MG capsule   Oral   Take 1 capsule (75 mg total) by mouth daily.   30 capsule   1   . sodium chloride 0.9 % infusion   Intravenous   Inject 5 mLs into the vein 2 (two) times daily.   120 mL   0     Allergies Cortisone; Ambien; Rocephin; and Levaquin  Family History  Problem Relation Age of Onset  . Dementia Mother   . Depression Mother     Social History History  Substance Use Topics  . Smoking status: Former Research scientist (life sciences)  . Smokeless tobacco: Not on file  . Alcohol Use: No    Review of Systems  Constitutional: Intermittent low-grade fever. Weakness Eyes: Negative for visual changes. ENT: Negative for sore throat. Cardiovascular: Negative for chest pain. Respiratory: Negative for shortness of breath. Gastrointestinal: Negative for abdominal pain, vomiting and diarrhea. Genitourinary: Foul-smelling dark urine  Musculoskeletal: Negative for back pain. Skin: Negative for rash. Neurological: Negative for headaches, focal weakness or numbness.   10-point ROS otherwise negative.  ____________________________________________   PHYSICAL EXAM:  VITAL SIGNS: ED Triage Vitals  Enc Vitals Group     BP 08/11/14 2008 155/74 mmHg     Pulse Rate 08/11/14 2008 82     Resp --      Temp 08/11/14 2008 98.5 F (36.9 C)     Temp Source 08/11/14 2008 Oral  SpO2 08/11/14 2008 96 %     Weight 08/11/14 2008 217 lb (98.431 kg)     Height 08/11/14 2008 _0  (1.727 m)     Head Cir --      Peak Flow --      Pain Score 08/11/14 2009 10     Pain Loc --      Pain Edu? --      Excl. in [place]? --      Constitutional: Alert and oriented. Well appearing and in no distress. Eyes: Conjunctivae are normal. PERRL. Normal extraocular movements. ENT   Head: Normocephalic and atraumatic.   Nose: No congestion/rhinnorhea.   Mouth/Throat: Dry mucous membranes   Neck: No stridor. Hematological/Lymphatic/Immunilogical: No cervical lymphadenopathy. Cardiovascular: Normal rate, regular rhythm. Normal and symmetric distal pulses are present in all extremities. No murmurs, rubs, or gallops. Respiratory: Normal respiratory effort without tachypnea nor retractions. Breath sounds are clear and equal bilaterally. No wheezes/rales/rhonchi. Gastrointestinal: Soft and nontender. No distention. No abdominal bruits. There is no CVA tenderness. Genitourinary: Deferred  Musculoskeletal: Nontender with normal range of motion in all extremities. No joint effusions. There is mild bilateral lower extremity edema. Right upper extremity with PICC line CDI. There is no swelling or edema to the upper extremity bilaterally. Neurologic:  Normal speech and language. No gross focal neurologic deficits are appreciated. Speech is normal. No gait instability. Skin:  Skin is warm, dry and intact. No rash noted. Psychiatric: Mood and affect are normal. Speech and behavior are normal. Patient exhibits appropriate insight and judgment.  ____________________________________________    LABS (pertinent positives/negatives)  Acute on chronic renal failure. Stable hemoglobin.  ____________________________________________   EKG  ED ECG REPORT   Date: 08/11/2014  EKG Time: 2010  Rate: 83  Rhythm: Normal sinus rhythm Axis: Rightward axis  Intervals:none  ST&T Change: T wave  inversions in 3 and aVF. There are no ST elevations or depressions. There is also a T-wave inversion in lead V6. EKG is unchanged from 08/01/2014. There are also Q waves in leads II, III, and F aVF on both EKGs.  ED ECG REPORT   Date: 08/11/2014  EKG Time: 2224  Rate: 100  Rhythm: normal EKG, normal sinus rhythm, unchanged from previous tracings  Axis: Rightward axis  Intervals:none  ST&T Change: 1 mm ST depression in V2 through V5 with inverted T waves which is new from previous EKG. This EKG is status post run of V. tach. STEMI. No elevation in aVR. Persistent T-wave inversions in II, III, and F aVF.  ____________________________________________    RADIOLOGY  No DVT to the right upper extremity.  ____________________________________________   PROCEDURES  Procedure(s) performed: Culture and for patient in ventricular tachycardia. Patient is mentating and has a pulse with a wide complex regular pattern on the monitor. The patient was asked to perform a vagal maneuver of a cough and then broke spontaneously back to sinus rhythm.  Critical Care performed: Critical care performed for stable ventricular tachycardia. 10 minutes.  Began amiodarone bolus followed by the drip. About 8 minutes and the bolus the patient had a short but sustained run of V. tach for about 10 seconds. He also remained conscious throughout this episode.  ____________________________________________   INITIAL IMPRESSION / ASSESSMENT AND PLAN / ED COURSE  Pertinent labs & imaging results that were available during my care of the patient were reviewed by me and considered in my medical decision making (see chart for details).  ----------------------------------------- 10:54 PM on 08/11/2014 -----------------------------------------  Called  to the bedside for patient and he sustained ventricular tachycardia. The patient had a wide complex regular rhythm on the monitor. The patient was awake and talking  throughout with a pulse. Denies any chest pain.  Patient will be admitted to the hospital. Amiodarone drip continues to run. As of 1125 the patient is stable without any further runs of ventricular tachycardia. He continues to be pain-free. I will give him a small bolus of IV normal saline due to the patient's acute renal failure but with a history of CHF. Pending urinalysis. Hospitalist follow-up with results.  ____________________________________________   FINAL CLINICAL IMPRESSION(S) / ED DIAGNOSES   Acute on chronic renal failure. Sustained ventricular tachycardia now resolved. Acute, return visit.  Doran Stabler, MD 08/11/14 [phone]

## 2014-08-11 NOTE — ED Notes (Signed)
Pt in [place] on monitor, HR 205, pt placed on code cart and monitor, MD at bedside, pt awake and alert with pulse, MD instructed pt to cough, pt spontaneously converted to NSR with PVCs, pt states dizziness and weakness, pt states "I feel like the room is going round and round", pt NSR at this time HR 95, 2nd IV access established

## 2014-08-11 NOTE — ED Notes (Signed)
Pt arrives via EMS with complaints of fever, lethargy, and weakness, pt complains of right arm pain, pt arrives with PICC line in place in right arm, pt states he was in the hospital for 4 weeks for ecoli abx

## 2014-08-12 ENCOUNTER — Inpatient Hospital Stay (HOSPITAL_COMMUNITY): Payer: Medicare Other

## 2014-08-12 ENCOUNTER — Encounter: Payer: Self-pay | Admitting: Internal Medicine

## 2014-08-12 ENCOUNTER — Inpatient Hospital Stay: Payer: Medicare Other

## 2014-08-12 DIAGNOSIS — I472 Ventricular tachycardia: Secondary | ICD-10-CM

## 2014-08-12 DIAGNOSIS — H9190 Unspecified hearing loss, unspecified ear: Secondary | ICD-10-CM

## 2014-08-12 DIAGNOSIS — R5383 Other fatigue: Secondary | ICD-10-CM

## 2014-08-12 DIAGNOSIS — R531 Weakness: Secondary | ICD-10-CM

## 2014-08-12 DIAGNOSIS — I5043 Acute on chronic combined systolic (congestive) and diastolic (congestive) heart failure: Secondary | ICD-10-CM

## 2014-08-12 DIAGNOSIS — D649 Anemia, unspecified: Secondary | ICD-10-CM

## 2014-08-12 DIAGNOSIS — I252 Old myocardial infarction: Secondary | ICD-10-CM

## 2014-08-12 DIAGNOSIS — F410 Panic disorder [episodic paroxysmal anxiety] without agoraphobia: Secondary | ICD-10-CM

## 2014-08-12 DIAGNOSIS — Z8673 Personal history of transient ischemic attack (TIA), and cerebral infarction without residual deficits: Secondary | ICD-10-CM

## 2014-08-12 DIAGNOSIS — N183 Chronic kidney disease, stage 3 (moderate): Secondary | ICD-10-CM

## 2014-08-12 DIAGNOSIS — I429 Cardiomyopathy, unspecified: Secondary | ICD-10-CM

## 2014-08-12 DIAGNOSIS — N179 Acute kidney failure, unspecified: Secondary | ICD-10-CM

## 2014-08-12 DIAGNOSIS — Z66 Do not resuscitate: Secondary | ICD-10-CM

## 2014-08-12 DIAGNOSIS — E119 Type 2 diabetes mellitus without complications: Secondary | ICD-10-CM

## 2014-08-12 DIAGNOSIS — N189 Chronic kidney disease, unspecified: Secondary | ICD-10-CM

## 2014-08-12 DIAGNOSIS — Z515 Encounter for palliative care: Secondary | ICD-10-CM

## 2014-08-12 DIAGNOSIS — I129 Hypertensive chronic kidney disease with stage 1 through stage 4 chronic kidney disease, or unspecified chronic kidney disease: Secondary | ICD-10-CM

## 2014-08-12 DIAGNOSIS — M79601 Pain in right arm: Secondary | ICD-10-CM

## 2014-08-12 DIAGNOSIS — I2510 Atherosclerotic heart disease of native coronary artery without angina pectoris: Secondary | ICD-10-CM

## 2014-08-12 DIAGNOSIS — A419 Sepsis, unspecified organism: Secondary | ICD-10-CM

## 2014-08-12 DIAGNOSIS — I340 Nonrheumatic mitral (valve) insufficiency: Secondary | ICD-10-CM

## 2014-08-12 DIAGNOSIS — K121 Other forms of stomatitis: Secondary | ICD-10-CM

## 2014-08-12 DIAGNOSIS — D631 Anemia in chronic kidney disease: Secondary | ICD-10-CM

## 2014-08-12 LAB — CBC
HCT: 27.9 % — ABNORMAL LOW (ref 40.0–52.0)
HEMATOCRIT: 28.7 % — AB (ref 40.0–52.0)
HEMOGLOBIN: 9.2 g/dL — AB (ref 13.0–18.0)
Hemoglobin: 9.3 g/dL — ABNORMAL LOW (ref 13.0–18.0)
MCH: 29.2 pg (ref 26.0–34.0)
MCH: 27.8 pg (ref 26.0–34.0)
MCHC: 33.1 g/dL (ref 32.0–36.0)
MCHC: 32.3 g/dL (ref 32.0–36.0)
MCV: 88.4 fL (ref 80.0–100.0)
MCV: 86.1 fL (ref 80.0–100.0)
PLATELETS: 292 10*3/uL (ref 150–440)
Platelets: 305 10*3/uL (ref 150–440)
RBC: 3.16 MIL/uL — ABNORMAL LOW (ref 4.40–5.90)
RBC: 3.33 MIL/uL — AB (ref 4.40–5.90)
RDW: 17.1 % — ABNORMAL HIGH (ref 11.5–14.5)
RDW: 16.9 % — ABNORMAL HIGH (ref 11.5–14.5)
WBC: 8.8 10*3/uL (ref 3.8–10.6)
WBC: 11.2 10*3/uL — AB (ref 3.8–10.6)

## 2014-08-12 LAB — BASIC METABOLIC PANEL
ANION GAP: 10 (ref 5–15)
BUN: 34 mg/dL — ABNORMAL HIGH (ref 6–20)
CO2: 22 mmol/L (ref 22–32)
Calcium: 8.4 mg/dL — ABNORMAL LOW (ref 8.9–10.3)
Chloride: 106 mmol/L (ref 101–111)
Creatinine, Ser: 2.43 mg/dL — ABNORMAL HIGH (ref 0.61–1.24)
GFR calc Af Amer: 29 mL/min — ABNORMAL LOW (ref >60)
GFR calc non Af Amer: 25 mL/min — ABNORMAL LOW (ref >60)
Glucose, Bld: 136 mg/dL — ABNORMAL HIGH (ref 65–99)
Potassium: 3.8 mmol/L (ref 3.5–5.1)
SODIUM: 138 mmol/L (ref 135–145)

## 2014-08-12 LAB — GLUCOSE, CAPILLARY
GLUCOSE-CAPILLARY: 121 mg/dL — AB (ref 70–99)
Glucose-Capillary: 126 mg/dL — ABNORMAL HIGH (ref 70–99)
Glucose-Capillary: 152 mg/dL — ABNORMAL HIGH (ref 70–99)
Glucose-Capillary: 156 mg/dL — ABNORMAL HIGH (ref 70–99)

## 2014-08-12 LAB — MAGNESIUM: Magnesium: 1.7 mg/dL (ref 1.7–2.4)

## 2014-08-12 LAB — TROPONIN I
Troponin I: 0.04 ng/mL — ABNORMAL HIGH (ref <0.031)
Troponin I: 0.04 ng/mL — ABNORMAL HIGH (ref <0.031)

## 2014-08-12 LAB — CREATININE, SERUM
Creatinine, Ser: 2.54 mg/dL — ABNORMAL HIGH (ref 0.61–1.24)
GFR calc Af Amer: 27 mL/min — ABNORMAL LOW (ref >60)
GFR calc non Af Amer: 23 mL/min — ABNORMAL LOW (ref >60)

## 2014-08-12 MED ORDER — VANCOMYCIN HCL IN DEXTROSE 1-5 GM/200ML-% IV SOLN
1000.0000 mg | INTRAVENOUS | Status: DC
Start: 2014-08-13 — End: 2014-08-13
  Filled 2014-08-12: qty 200

## 2014-08-12 MED ORDER — RISAQUAD PO CAPS
2.0000 | ORAL_CAPSULE | Freq: Every day | ORAL | Status: DC
  Administered 2014-08-12 – 2014-08-13 (×2): 2 via ORAL
  Filled 2014-08-12 (×2): qty 2

## 2014-08-12 MED ORDER — ERTAPENEM SODIUM 1 G IJ SOLR
1.0000 g | Freq: Once | INTRAMUSCULAR | Status: AC
  Administered 2014-08-12: 1 g via INTRAVENOUS
  Filled 2014-08-12: qty 1

## 2014-08-12 MED ORDER — INSULIN ASPART 100 UNIT/ML ~~LOC~~ SOLN
0.0000 [IU] | Freq: Three times a day (TID) | SUBCUTANEOUS | Status: DC
  Administered 2014-08-12: 1 [IU] via SUBCUTANEOUS
  Administered 2014-08-12: 2 [IU] via SUBCUTANEOUS
  Administered 2014-08-13: 1 [IU] via SUBCUTANEOUS
  Filled 2014-08-12: qty 2
  Filled 2014-08-12: qty 1

## 2014-08-12 MED ORDER — AMLODIPINE BESYLATE 5 MG PO TABS
5.0000 mg | ORAL_TABLET | Freq: Two times a day (BID) | ORAL | Status: DC
  Administered 2014-08-12 – 2014-08-13 (×3): 5 mg via ORAL
  Filled 2014-08-12 (×3): qty 1

## 2014-08-12 MED ORDER — SODIUM CHLORIDE 0.9 % IV SOLN
INTRAVENOUS | Status: DC
  Administered 2014-08-12 – 2014-08-13 (×3): via INTRAVENOUS

## 2014-08-12 MED ORDER — VANCOMYCIN HCL IN DEXTROSE 1-5 GM/200ML-% IV SOLN
1000.0000 mg | Freq: Once | INTRAVENOUS | Status: AC
  Administered 2014-08-12: 1000 mg via INTRAVENOUS
  Filled 2014-08-12: qty 200

## 2014-08-12 MED ORDER — ACETAMINOPHEN 325 MG PO TABS: 650.0000 mg | ORAL_TABLET | Freq: Four times a day (QID) | ORAL | Status: DC | PRN

## 2014-08-12 MED ORDER — HYDROCODONE-ACETAMINOPHEN 5-325 MG PO TABS
1.0000 | ORAL_TABLET | ORAL | Status: DC | PRN
  Administered 2014-08-12: 1 via ORAL
  Filled 2014-08-12: qty 1

## 2014-08-12 MED ORDER — CLOPIDOGREL BISULFATE 75 MG PO TABS
75.0000 mg | ORAL_TABLET | Freq: Every day | ORAL | Status: DC
  Administered 2014-08-12 – 2014-08-13 (×2): 75 mg via ORAL
  Filled 2014-08-12 (×2): qty 1

## 2014-08-12 MED ORDER — SODIUM CHLORIDE 0.9 % IV SOLN
1.0000 g | INTRAVENOUS | Status: DC
  Filled 2014-08-12 (×2): qty 1

## 2014-08-12 MED ORDER — PREGABALIN 75 MG PO CAPS
75.0000 mg | ORAL_CAPSULE | Freq: Every day | ORAL | Status: DC
  Administered 2014-08-12 – 2014-08-13 (×2): 75 mg via ORAL
  Filled 2014-08-12 (×2): qty 1

## 2014-08-12 MED ORDER — CALCIUM CARBONATE-VITAMIN D 500-200 MG-UNIT PO TABS
ORAL_TABLET | Freq: Every day | ORAL | Status: DC
  Administered 2014-08-12: 1 via ORAL
  Administered 2014-08-13: 10:00:00 via ORAL
  Filled 2014-08-12 (×2): qty 1

## 2014-08-12 MED ORDER — ONE-DAILY MULTI VITAMINS PO TABS
1.0000 | ORAL_TABLET | Freq: Every day | ORAL | Status: DC
  Administered 2014-08-12 – 2014-08-13 (×2): 1 via ORAL
  Filled 2014-08-12 (×2): qty 1

## 2014-08-12 MED ORDER — FERROUS SULFATE 325 (65 FE) MG PO TABS
325.0000 mg | ORAL_TABLET | Freq: Every morning | ORAL | Status: DC
  Administered 2014-08-12: 325 mg via ORAL
  Filled 2014-08-12 (×2): qty 1

## 2014-08-12 MED ORDER — DOCUSATE SODIUM 100 MG PO CAPS
100.0000 mg | ORAL_CAPSULE | Freq: Every day | ORAL | Status: DC
  Administered 2014-08-12 (×2): 100 mg via ORAL
  Filled 2014-08-12 (×2): qty 1

## 2014-08-12 MED ORDER — INSULIN ASPART 100 UNIT/ML ~~LOC~~ SOLN
0.0000 [IU] | Freq: Every day | SUBCUTANEOUS | Status: DC
  Filled 2014-08-12: qty 1

## 2014-08-12 MED ORDER — AMIODARONE HCL IN DEXTROSE 360-4.14 MG/200ML-% IV SOLN
60.0000 mg/h | INTRAVENOUS | Status: DC
  Filled 2014-08-12: qty 200

## 2014-08-12 MED ORDER — LORAZEPAM 2 MG/ML IJ SOLN
1.0000 mg | Freq: Once | INTRAMUSCULAR | Status: AC
  Administered 2014-08-12: 1 mg via INTRAVENOUS
  Filled 2014-08-12: qty 1

## 2014-08-12 MED ORDER — AMIODARONE HCL IN DEXTROSE 360-4.14 MG/200ML-% IV SOLN
30.0000 mg/h | INTRAVENOUS | Status: DC
  Administered 2014-08-12 – 2014-08-13 (×2): 30 mg/h via INTRAVENOUS
  Filled 2014-08-12 (×8): qty 200

## 2014-08-12 MED ORDER — ALLOPURINOL 100 MG PO TABS
100.0000 mg | ORAL_TABLET | Freq: Every day | ORAL | Status: DC
  Administered 2014-08-12 – 2014-08-13 (×2): 100 mg via ORAL
  Filled 2014-08-12 (×2): qty 1

## 2014-08-12 MED ORDER — CARVEDILOL 3.125 MG PO TABS
3.1250 mg | ORAL_TABLET | Freq: Two times a day (BID) | ORAL | Status: DC
  Administered 2014-08-12 – 2014-08-13 (×3): 3.125 mg via ORAL
  Filled 2014-08-12 (×3): qty 1

## 2014-08-12 NOTE — Progress Notes (Signed)
Amiodarone medication interaction consult  All medications reviewed. No interactions requiring therapeutic modifications identified.  Pharmacy will follow on.

## 2014-08-12 NOTE — Progress Notes (Signed)
MEDICATION RELATED CONSULT NOTE - FOLLOW UP   Pharmacy Consult for Medication Review Indication: drug-drug interaction with amiodarone  Allergies  Allergen Reactions  . Cortisone Anaphylaxis and Other (See Comments)    Reaction:  Cold, elevated BP, shaking all over, and fever Pts wife states that he tolerates hydrocortisone cream at home.    . Ambien [Zolpidem Tartrate] Other (See Comments)    Reaction:  Pt did not "tolerate well" or remember anything from the previous night.    . Rocephin [Ceftriaxone] Hives  . Levaquin [Levofloxacin In D5w] Rash    Patient Measurements: Height: 5\' 8"  (172.7 cm) Weight: 211 lb 6.4 oz (95.89 kg) IBW/kg (Calculated) : 68.4   Vital Signs: Temp: 99.2 F (37.3 C) (05/05 0436) Temp Source: Oral (05/05 0436) BP: 179/75 mmHg (05/05 0646) Pulse Rate: 84 (05/05 0553) Intake/Output from previous day: 05/04 0701 - 05/05 0700 In: 357.1 [I.V.:357.1] Out: 400 [Urine:400] Intake/Output from this shift:    Labs:  Recent Labs  08/11/14 2015 08/12/14 0303  WBC 7.2 8.8  HGB 9.7* 9.2*  HCT 29.0* 27.9*  PLT 310 292  CREATININE 2.76* 2.54*  MG  --  1.7   Estimated Creatinine Clearance: 28.7 mL/min (by C-G formula based on Cr of 2.54).   Microbiology: Recent Results (from the past 720 hour(s))  Culture, blood (single)     Status: None   Collection Time: 08/01/14  4:22 PM  Result Value Ref Range Status   Micro Text Report   Final       COMMENT                   NO GROWTH AEROBICALLY/ANAEROBICALLY IN 5 DAYS   ANTIBIOTIC                                                      Urine culture     Status: None   Collection Time: 08/01/14  5:12 PM  Result Value Ref Range Status   Micro Text Report   Final       SOURCE: CLEAN CATCH    ORGANISM 1                >100,000 CFU/ML Escherichia coli   COMMENT                   ESBL (Extended Spectrum Beta Lactamase)   COMMENT                   -   ANTIBIOTIC                    ORG#1     AMPICILLIN                     R         CEFAZOLIN                     R         CEFOXITIN                     S         CEFTRIAXONE                   R  CIPROFLOXACIN                 R         ERTAPENEM                     S         GENTAMICIN                    S         IMIPENEM                      S         LEVOFLOXACIN                  R         NITROFURANTOIN                S         ESBL                          POSITIVE  Trimethoprim/Sulfamethoxazole R           Culture, blood (single)     Status: None   Collection Time: 08/01/14  6:37 PM  Result Value Ref Range Status   Micro Text Report   Final       COMMENT                   NO GROWTH AEROBICALLY/ANAEROBICALLY IN 5 DAYS   ANTIBIOTIC                                                        Medications:  Scheduled:  . acidophilus  2 capsule Oral Daily  . allopurinol  100 mg Oral Daily  . amLODipine  5 mg Oral BID  . aspirin EC  81 mg Oral Daily  . calcium-vitamin D   Oral Daily  . carvedilol  3.125 mg Oral BID WC  . clopidogrel  75 mg Oral Q breakfast  . docusate sodium  100 mg Oral QHS  . ertapenem [facility]) IV  1 g Intravenous Once  . [START ON 08/13/2014] ertapenem  1 g Intravenous Q24H  . ferrous sulfate  325 mg Oral q morning - 10a  . heparin  5,000 Units Subcutaneous 3 times per day  . insulin aspart  0-5 Units Subcutaneous QHS  . insulin aspart  0-9 Units Subcutaneous TID WC  . multivitamin  1 tablet Oral Daily  . pregabalin  75 mg Oral Daily  . sodium chloride  3 mL Intravenous Q12H    Assessment:  Patient is a 74 yo male admitted for nonsustained ventricular tachycardia and started on Amiodarone drip.  Patient's current scheduled medications listed above.   Amiodarone may enhance the effects of other blood pressure lowering agents.   Monitor blood pressure closely as patient is also receiving amlodipine. Last BP measurement of 179/75.  Amiodarone may enhance the effects of beta blockers to cause bradycardia.   Monitor closely as patient is also receiving carvedilol. Last HR of 84  Goal of Therapy:  Modify medications if warranted based on drug interactions with warfarin.   Plan:  Monitor HR and BP closely while patient is receiving amiodarone  drip.  Pharmacy will continue to follow.   Murrell Converse, PharmD Clinical Pharmacist 08/12/2014

## 2014-08-12 NOTE — Progress Notes (Signed)
B/P reassessed and measures 179/75 in left arm lying while pt is asleep. Pt. Wakes easily continues to to respond to verbal commands. Prime MD paged awaiting return call.

## 2014-08-12 NOTE — Progress Notes (Signed)
Received pt from ER via stretcher.  Family at bedside. Assessment completed, pt. Repositioned in bed and sleeping peacefully.

## 2014-08-12 NOTE — Progress Notes (Signed)
ANTIBIOTIC CONSULT NOTE - INITIAL  Pharmacy Consult for Vancomycin Indication: rule out sepsis  Allergies  Allergen Reactions  . Cortisone Anaphylaxis and Other (See Comments)    Reaction:  Cold, elevated BP, shaking all over, and fever Pts wife states that he tolerates hydrocortisone cream at home.    . Ambien [Zolpidem Tartrate] Other (See Comments)    Reaction:  Pt did not "tolerate well" or remember anything from the previous night.    . Rocephin [Ceftriaxone] Hives  . Levaquin [Levofloxacin In D5w] Rash    Patient Measurements: Height: 5\' 8"  (172.7 cm) Weight: 211 lb 6.4 oz (95.89 kg) IBW/kg (Calculated) : 68.4 Adjusted Body Weight: 79.4 kg  Vital Signs: Temp: 102.2 F (39 C) (05/05 0910) Temp Source: Oral (05/05 0910) BP: 147/57 mmHg (05/05 0910) Pulse Rate: 95 (05/05 0910)   Intake/Output from previous day: 05/04 0701 - 05/05 0700 In: 357.1 [I.V.:357.1] Out: 400 [Urine:400]   Intake/Output from this shift:    Labs:  Recent Labs  08/11/14 2015 08/12/14 0303 08/12/14 0903  WBC 7.2 8.8 11.2*  HGB 9.7* 9.2* 9.3*  PLT 310 292 305  CREATININE 2.76* 2.54*  --    Estimated Creatinine Clearance: 28.7 mL/min (by C-G formula based on Cr of 2.54).  Microbiology: Recent Results (from the past 720 hour(s))  Culture, blood (single)     Status: None   Collection Time: 08/01/14  4:22 PM  Result Value Ref Range Status   Micro Text Report   Final       COMMENT                   NO GROWTH AEROBICALLY/ANAEROBICALLY IN 5 DAYS   ANTIBIOTIC                                                      Urine culture     Status: None   Collection Time: 08/01/14  5:12 PM  Result Value Ref Range Status   Micro Text Report   Final       SOURCE: CLEAN CATCH    ORGANISM 1                >100,000 CFU/ML Escherichia coli   COMMENT                   ESBL (Extended Spectrum Beta Lactamase)   COMMENT                   -   ANTIBIOTIC                    ORG#1     AMPICILLIN                     R         CEFAZOLIN                     R         CEFOXITIN                     S         CEFTRIAXONE                   R  CIPROFLOXACIN                 R         ERTAPENEM                     S         GENTAMICIN                    S         IMIPENEM                      S         LEVOFLOXACIN                  R         NITROFURANTOIN                S         ESBL                          POSITIVE  Trimethoprim/Sulfamethoxazole R           Culture, blood (single)     Status: None   Collection Time: 08/01/14  6:37 PM  Result Value Ref Range Status   Micro Text Report   Final       COMMENT                   NO GROWTH AEROBICALLY/ANAEROBICALLY IN 5 DAYS   ANTIBIOTIC                                                      Blood culture (routine x 2)     Status: None (Preliminary result)   Collection Time: 08/11/14  8:15 PM  Result Value Ref Range Status   Specimen Description BLOOD  Final   Special Requests NONE  Final   Culture NO GROWTH < 12 HOURS  Final   Report Status PENDING  Incomplete  Blood culture (routine x 2)     Status: None (Preliminary result)   Collection Time: 08/11/14  8:16 PM  Result Value Ref Range Status   Specimen Description BLOOD  Final   Special Requests NONE  Final   Culture NO GROWTH < 12 HOURS  Final   Report Status PENDING  Incomplete    Medical History: Past Medical History  Diagnosis Date  . Hearing loss   . Gout   . Diabetes   . Fatigue   . Hypertension   . Stroke     X 2  . Heart failure   . Muscle pain   . Hammertoe   . Myocardial infarction   . CHF (congestive heart failure)   . Shortness of breath dyspnea   . Depression   . Anxiety   . Chronic kidney disease   . Headache   . Arthritis   . Anemia     Medications:  Scheduled:  . acidophilus  2 capsule Oral Daily  . allopurinol  100 mg Oral Daily  . amLODipine  5 mg Oral BID  . aspirin EC  81 mg Oral Daily  . calcium-vitamin D   Oral Daily  . carvedilol   3.125 mg Oral BID WC  .  clopidogrel  75 mg Oral Q breakfast  . docusate sodium  100 mg Oral QHS  . ertapenem [facility]) IV  1 g Intravenous Once  . [START ON 08/13/2014] ertapenem  1 g Intravenous Q24H  . ferrous sulfate  325 mg Oral q morning - 10a  . heparin  5,000 Units Subcutaneous 3 times per day  . insulin aspart  0-5 Units Subcutaneous QHS  . insulin aspart  0-9 Units Subcutaneous TID WC  . LORazepam  1 mg Intravenous Once  . multivitamin  1 tablet Oral Daily  . pregabalin  75 mg Oral Daily  . sodium chloride  3 mL Intravenous Q12H  . vancomycin  1,000 mg Intravenous Once  . vancomycin  1,000 mg Intravenous Once  . [START ON 08/13/2014] vancomycin  1,000 mg Intravenous Q24H   Assessment:  Patient is a 74 yo male admitted with sustained v tach and started on amiodarone drip.  Patient previously admitted from 4/24-5/1 for treatment of ESBL UTI.  Discharged on Ertapenem 1 gm IV q24h and continued inhouse.  MD with new consult to start Vancomycin for possible sepsis/UTI.  ID consulted.    SCr: 2.54. Est CrCl~29 mL/min based on adjusted body weight, t1/2: 24.75 h, Vd: 55.6 L  Goal of Therapy:  Vancomycin trough level 15-20 mcg/ml  Plan:  Will order Vancomycin 1 gm IV once with next dose ~6 hours later for stacked dosing.  Will then implement Vancomycin 1 gm IV q24h for maintenance dosing.  Will check a trough on day 4 of therapy (not at steady state).  Trough ordered for 5/8 at 1600.  Pharmacy will continue to follow and adjust dosing as warranted.   Murrell Converse, PharmD Clinical Pharmacist 08/12/2014

## 2014-08-12 NOTE — Progress Notes (Signed)
Pt. Had an incontinent episode of the bowel and bladder around 0550, pt was clean and repositioned, vital signs were taken and B/P was out of the set parameter at 139/104. Pt. Was asymptomatic with no c/o pain, no sweating or nausea observed. Fifteen minutes later B/P was re-assessed and was 163/61 which at this time the systolic number was out of the parameter set. Will wait 15 more minute and retake B/P. Pt remains asymptomatic.

## 2014-08-12 NOTE — H&P (Signed)
[place] for Infectious Disease   Reason for Consult: ESBL E coli UTI, readmit with fevers    Referring Physician: Romero Belling  Principal Problem:   NSVT (nonsustained ventricular tachycardia) Active Problems:   Hypertension   CKD (chronic kidney disease), stage IV   Diabetes type 2, uncontrolled   Chronic combined systolic and diastolic heart failure   CAD in native artery   Hyperlipidemia   Diabetes mellitus, type 2   AKI (acute kidney injury)    HPI: GERAMY Darrell Richardson is a 74 y.o. male recently discharged on IV ertapenem for ESBL E coli UTI Readmitted with   Past Medical History  Diagnosis Date  . Hearing loss   . Gout   . Diabetes   . Fatigue   . Hypertension   . Stroke     X 2  . Heart failure   . Muscle pain   . Hammertoe   . Myocardial infarction 1970s  . Chronic combined systolic and diastolic congestive heart failure   . Shortness of breath dyspnea   . Depression   . Anxiety   . CKD (chronic kidney disease), stage IV   . Headache   . Arthritis   . Anemia    Past Surgical History  Procedure Laterality Date  . Stomach surgery    . Foot surgery Right   . Tee without cardioversion N/A 06/07/2014    Procedure: TRANSESOPHAGEAL ECHOCARDIOGRAM (TEE);  Surgeon: Jolaine Artist, MD;  Location: [facility] ENDOSCOPY;  Service: Cardiovascular;  Laterality: N/A;  . Cholecystectomy      Allergies:  Allergies  Allergen Reactions  . Cortisone Anaphylaxis and Other (See Comments)    Reaction:  Cold, elevated BP, shaking all over, and fever Pts wife states that he tolerates hydrocortisone cream at home.    . Ambien [Zolpidem Tartrate] Other (See Comments)    Reaction:  Pt did not "tolerate well" or remember anything from the previous night.    . Rocephin [Ceftriaxone] Hives  . Levaquin [Levofloxacin In D5w] Rash    Current antibiotics: Antibiotics Given (last 72 hours)    None      Antibiotics Given (last 72 hours)    None       MEDICATIONS: .  acidophilus  2 capsule Oral Daily  . allopurinol  100 mg Oral Daily  . amLODipine  5 mg Oral BID  . aspirin EC  81 mg Oral Daily  . calcium-vitamin D   Oral Daily  . carvedilol  3.125 mg Oral BID WC  . clopidogrel  75 mg Oral Q breakfast  . docusate sodium  100 mg Oral QHS  . ertapenem [facility]) IV  1 g Intravenous Once  . [START ON 08/13/2014] ertapenem  1 g Intravenous Q24H  . ferrous sulfate  325 mg Oral q morning - 10a  . heparin  5,000 Units Subcutaneous 3 times per day  . insulin aspart  0-5 Units Subcutaneous QHS  . insulin aspart  0-9 Units Subcutaneous TID WC  . multivitamin  1 tablet Oral Daily  . pregabalin  75 mg Oral Daily  . sodium chloride  3 mL Intravenous Q12H  . vancomycin  1,000 mg Intravenous Once  . vancomycin  1,000 mg Intravenous Once  . [START ON 08/13/2014] vancomycin  1,000 mg Intravenous Q24H    History  Substance Use Topics  . Smoking status: Former Research scientist (life sciences)  . Smokeless tobacco: Not on file  . Alcohol Use: No    Family History  Problem Relation  Age of Onset  . Dementia Mother   . Depression Mother     Review of Systems - 2 systems reviewed and negative per HPI   OBJECTIVE: Temp:  [98.5 F (36.9 C)-102.2 F (39 C)] 100.1 F (37.8 C) (05/05 1105) Pulse Rate:  [82-95] 85 (05/05 1105) Resp:  [17-24] 21 (05/05 1105) BP: (99-179)/(57-104) 99/61 mmHg (05/05 1105) SpO2:  [64 %-100 %] 96 % (05/05 1105) Weight:  [95.709 kg (211 lb)-98.431 kg (217 lb)] 95.709 kg (211 lb) (05/05 1200)  Physical Exam  Constitutional: He is oriented to person, place, and time.frail Mouth/Throat: Oropharynx is clear and moist. No oropharyngeal exudate.  Cardiovascular: Normal rate, regular rhythm and normal heart sounds. Exam reveals no gallop and no friction rub.  No murmur heard.  Pulmonary/Chest: Effort normal and breath sounds normal. No respiratory distress. He has no wheezes.  Abdominal: Soft. Bowel sounds are normal. He exhibits no distension. There is no  tenderness.  Lymphadenopathy:  He has no cervical adenopathy.  Neurological: He is alert and oriented to person, place, and time.  Skin: Skin is warm and dry. No rash noted. No erythema.  Psychiatric: He has a normal mood and affect. His behavior is normal.     LABS: Results for orders placed or performed during the hospital encounter of 08/11/14 (from the past 48 hour(s))  Urinalysis complete, with microscopic [facility])     Status: Abnormal   Collection Time: 08/11/14  8:11 PM  Result Value Ref Range   Color, Urine YELLOW (A) YELLOW   APPearance HAZY (A) CLEAR   Glucose, UA 150 (A) NEGATIVE mg/dL   Bilirubin Urine NEGATIVE NEGATIVE   Ketones, ur NEGATIVE NEGATIVE mg/dL   Specific Gravity, Urine 1.011 1.005 - 1.030   Hgb urine dipstick 2+ (A) NEGATIVE   pH 5.0 5.0 - 8.0   Protein, ur >500 (A) NEGATIVE mg/dL   Nitrite NEGATIVE NEGATIVE   Leukocytes, UA NEGATIVE NEGATIVE   RBC / HPF 0-5 0 - 5 RBC/hpf   WBC, UA 6-30 0 - 5 WBC/hpf   Bacteria, UA FEW (A) NONE SEEN   Squamous Epithelial / LPF 0-5 (A) NONE SEEN   Mucous PRESENT    Hyaline Casts, UA PRESENT    Amorphous Crystal PRESENT   Urine culture     Status: None (Preliminary result)   Collection Time: 08/11/14  8:12 PM  Result Value Ref Range   Specimen Description URINE, CLEAN CATCH    Special Requests NONE    Culture NO GROWTH < 12 HOURS    Report Status PENDING   Basic metabolic panel     Status: Abnormal   Collection Time: 08/11/14  8:15 PM  Result Value Ref Range   Sodium 139 135 - 145 mmol/L   Potassium 4.1 3.5 - 5.1 mmol/L   Chloride 104 101 - 111 mmol/L   CO2 22 22 - 32 mmol/L   Glucose, Bld 121 (H) 65 - 99 mg/dL   BUN 36 (H) 6 - 20 mg/dL   Creatinine, Ser 2.76 (H) 0.61 - 1.24 mg/dL   Calcium 9.0 8.9 - 10.3 mg/dL   GFR calc non Af Amer 21 (L) >60 mL/min   GFR calc Af Amer 24 (L) >60 mL/min    Comment: (NOTE) The eGFR has been calculated using the CKD EPI equation. This calculation has not been validated in all  clinical situations. eGFR's persistently <90 mL/min signify possible Chronic Kidney Disease.    Anion gap 13 5 - 15  CBC  Status: Abnormal   Collection Time: 08/11/14  8:15 PM  Result Value Ref Range   WBC 7.2 3.8 - 10.6 K/uL   RBC 3.26 (L) 4.40 - 5.90 MIL/uL   Hemoglobin 9.7 (L) 13.0 - 18.0 g/dL   HCT 29.0 (L) 40.0 - 52.0 %   MCV 88.9 80.0 - 100.0 fL   MCH 29.9 26.0 - 34.0 pg   MCHC 33.6 32.0 - 36.0 g/dL   RDW 17.2 (H) 11.5 - 14.5 %   Platelets 310 150 - 440 K/uL  Troponin I     Status: None   Collection Time: 08/11/14  8:15 PM  Result Value Ref Range   Troponin I 0.03 <0.031 ng/mL    Comment:        NO INDICATION OF MYOCARDIAL INJURY.   Blood culture (routine x 2)     Status: None (Preliminary result)   Collection Time: 08/11/14  8:15 PM  Result Value Ref Range   Specimen Description BLOOD    Special Requests NONE    Culture NO GROWTH < 12 HOURS    Report Status PENDING   Blood culture (routine x 2)     Status: None (Preliminary result)   Collection Time: 08/11/14  8:16 PM  Result Value Ref Range   Specimen Description BLOOD    Special Requests NONE    Culture NO GROWTH < 12 HOURS    Report Status PENDING   Magnesium     Status: None   Collection Time: 08/12/14  3:03 AM  Result Value Ref Range   Magnesium 1.7 1.7 - 2.4 mg/dL  CBC     Status: Abnormal   Collection Time: 08/12/14  3:03 AM  Result Value Ref Range   WBC 8.8 3.8 - 10.6 K/uL   RBC 3.16 (L) 4.40 - 5.90 MIL/uL   Hemoglobin 9.2 (L) 13.0 - 18.0 g/dL   HCT 27.9 (L) 40.0 - 52.0 %   MCV 88.4 80.0 - 100.0 fL   MCH 29.2 26.0 - 34.0 pg   MCHC 33.1 32.0 - 36.0 g/dL   RDW 17.1 (H) 11.5 - 14.5 %   Platelets 292 150 - 440 K/uL  Creatinine, serum     Status: Abnormal   Collection Time: 08/12/14  3:03 AM  Result Value Ref Range   Creatinine, Ser 2.54 (H) 0.61 - 1.24 mg/dL   GFR calc non Af Amer 23 (L) >60 mL/min   GFR calc Af Amer 27 (L) >60 mL/min    Comment: (NOTE) The eGFR has been calculated using the  CKD EPI equation. This calculation has not been validated in all clinical situations. eGFR's persistently <90 mL/min signify possible Chronic Kidney Disease.   Troponin I     Status: Abnormal   Collection Time: 08/12/14  3:03 AM  Result Value Ref Range   Troponin I 0.04 (H) <0.031 ng/mL    Comment: READ BACK AND VERIFIED C/ LISA ROMERO @0417  08/12/14 BY AJO        PERSISTENTLY INCREASED TROPONIN VALUES IN THE RANGE OF 0.04-0.49 ng/mL CAN BE SEEN IN:       -UNSTABLE ANGINA       -CONGESTIVE HEART FAILURE       -MYOCARDITIS       -CHEST TRAUMA       -ARRYHTHMIAS       -LATE PRESENTING MYOCARDIAL INFARCTION       -COPD   CLINICAL FOLLOW-UP RECOMMENDED.   Glucose, capillary     Status: Abnormal   Collection  Time: 08/12/14  7:27 AM  Result Value Ref Range   Glucose-Capillary 126 (H) 70 - 99 mg/dL  Basic metabolic panel     Status: Abnormal   Collection Time: 08/12/14  9:03 AM  Result Value Ref Range   Sodium 138 135 - 145 mmol/L   Potassium 3.8 3.5 - 5.1 mmol/L   Chloride 106 101 - 111 mmol/L   CO2 22 22 - 32 mmol/L   Glucose, Bld 136 (H) 65 - 99 mg/dL   BUN 34 (H) 6 - 20 mg/dL   Creatinine, Ser 2.43 (H) 0.61 - 1.24 mg/dL   Calcium 8.4 (L) 8.9 - 10.3 mg/dL   GFR calc non Af Amer 25 (L) >60 mL/min   GFR calc Af Amer 29 (L) >60 mL/min    Comment: (NOTE) The eGFR has been calculated using the CKD EPI equation. This calculation has not been validated in all clinical situations. eGFR's persistently <90 mL/min signify possible Chronic Kidney Disease.    Anion gap 10 5 - 15  CBC     Status: Abnormal   Collection Time: 08/12/14  9:03 AM  Result Value Ref Range   WBC 11.2 (H) 3.8 - 10.6 K/uL   RBC 3.33 (L) 4.40 - 5.90 MIL/uL   Hemoglobin 9.3 (L) 13.0 - 18.0 g/dL   HCT 28.7 (L) 40.0 - 52.0 %   MCV 86.1 80.0 - 100.0 fL   MCH 27.8 26.0 - 34.0 pg   MCHC 32.3 32.0 - 36.0 g/dL   RDW 16.9 (H) 11.5 - 14.5 %   Platelets 305 150 - 440 K/uL  Troponin I (q 6hr x 3)     Status:  Abnormal   Collection Time: 08/12/14  9:03 AM  Result Value Ref Range   Troponin I 0.04 (H) <0.031 ng/mL    Comment: CALLED TO AND READ BACK BY VICTORIA JONES ON 2A @ 1448 08/12/14 B[facility]        PERSISTENTLY INCREASED TROPONIN VALUES IN THE RANGE OF 0.04-0.49 ng/mL CAN BE SEEN IN:       -UNSTABLE ANGINA       -CONGESTIVE HEART FAILURE       -MYOCARDITIS       -CHEST TRAUMA       -ARRYHTHMIAS       -LATE PRESENTING MYOCARDIAL INFARCTION       -COPD   CLINICAL FOLLOW-UP RECOMMENDED.   Glucose, capillary     Status: Abnormal   Collection Time: 08/12/14 11:05 AM  Result Value Ref Range   Glucose-Capillary 152 (H) 70 - 99 mg/dL   No components found for: ESR, C REACTIVE PROTEIN MICRO:  IMAGING: US Venous Img Upper Uni Right  08/11/2014   CLINICAL DATA:  Right upper extremity pain.  Recent PICC line.  EXAM: Right UPPER EXTREMITY VENOUS DOPPLER ULTRASOUND  TECHNIQUE: Gray-scale sonography with graded compression, as well as color Doppler and duplex ultrasound were performed to evaluate the upper extremity deep venous system from the level of the subclavian vein and including the jugular, axillary, basilic, radial, ulnar and upper cephalic vein. Spectral Doppler was utilized to evaluate flow at rest and with distal augmentation maneuvers.  COMPARISON:  Chest radiograph 08/11/2014  FINDINGS: Contralateral Subclavian Vein: Respiratory phasicity is normal and symmetric with the symptomatic side. No evidence of thrombus. Normal compressibility.  Internal Jugular Vein: No evidence of thrombus. Normal compressibility, respiratory phasicity and response to augmentation.  Subclavian Vein: No evidence of thrombus. Normal compressibility, respiratory phasicity and response to augmentation.  Axillary  Vein: No evidence of thrombus. Normal compressibility, respiratory phasicity and response to augmentation.  Cephalic Vein: No evidence of thrombus. Normal compressibility, respiratory phasicity and response to  augmentation.  Basilic Vein: No evidence of thrombus. Normal compressibility, respiratory phasicity and response to augmentation.  Brachial Veins: No evidence of thrombus. Normal compressibility, respiratory phasicity and response to augmentation.  Radial Veins: No evidence of thrombus. Normal compressibility, respiratory phasicity and response to augmentation.  Ulnar Veins: No evidence of thrombus. Normal compressibility, respiratory phasicity and response to augmentation.  Other Findings: There is a PICC line in the proximal basilic vein extending into the axillary and subclavian vein.  IMPRESSION: No evidence of deep venous thrombosis.   Electronically Signed   By: Suzy Bouchard M.D.   On: 08/11/2014 21:53   Dg Chest Port 1 View  08/11/2014   CLINICAL DATA:  Weakness and right-sided chest pain  EXAM: PORTABLE CHEST - 1 VIEW  COMPARISON:  08/07/2014  FINDINGS: Cardiac shadow is stable. A right-sided PICC line is again noted. The lungs are well aerated with mild right basilar atelectasis. No focal confluent infiltrate is seen. Degenerative changes of the shoulder joints are noted.  IMPRESSION: Mild right basilar atelectasis.  Stable PICC line.   Electronically Signed   By: Inez Catalina M.D.   On: 08/11/2014 20:32    HISTORICAL MICRO/IMAGING Recent Results (from the past 720 hour(s))  Culture, blood (single)     Status: None   Collection Time: 08/01/14  4:22 PM  Result Value Ref Range Status   Micro Text Report   Final       COMMENT                   NO GROWTH AEROBICALLY/ANAEROBICALLY IN 5 DAYS   ANTIBIOTIC                                                      Urine culture     Status: None   Collection Time: 08/01/14  5:12 PM  Result Value Ref Range Status   Micro Text Report   Final       SOURCE: CLEAN CATCH    ORGANISM 1                >100,000 CFU/ML Escherichia coli   COMMENT                   ESBL (Extended Spectrum Beta Lactamase)   COMMENT                   -   ANTIBIOTIC                     ORG#1     AMPICILLIN                    R         CEFAZOLIN                     R         CEFOXITIN                     S         CEFTRIAXONE  R         CIPROFLOXACIN                 R         ERTAPENEM                     S         GENTAMICIN                    S         IMIPENEM                      S         LEVOFLOXACIN                  R         NITROFURANTOIN                S         ESBL                          POSITIVE  Trimethoprim/Sulfamethoxazole R           Culture, blood (single)     Status: None   Collection Time: 08/01/14  6:37 PM  Result Value Ref Range Status   Micro Text Report   Final       COMMENT                   NO GROWTH AEROBICALLY/ANAEROBICALLY IN 5 DAYS   ANTIBIOTIC                                                      Urine culture     Status: None (Preliminary result)   Collection Time: 08/11/14  8:12 PM  Result Value Ref Range Status   Specimen Description URINE, CLEAN CATCH  Final   Special Requests NONE  Final   Culture NO GROWTH < 12 HOURS  Final   Report Status PENDING  Incomplete  Blood culture (routine x 2)     Status: None (Preliminary result)   Collection Time: 08/11/14  8:15 PM  Result Value Ref Range Status   Specimen Description BLOOD  Final   Special Requests NONE  Final   Culture NO GROWTH < 12 HOURS  Final   Report Status PENDING  Incomplete  Blood culture (routine x 2)     Status: None (Preliminary result)   Collection Time: 08/11/14  8:16 PM  Result Value Ref Range Status   Specimen Description BLOOD  Final   Special Requests NONE  Final   Culture NO GROWTH < 12 HOURS  Final   Report Status PENDING  Incomplete   Assessment/Plan:   74 year old gentleman with history of congestive heart failure, chronic kidney disease, recent stroke, who was admitted in February to [facility] where he had a Foley catheter in place for congestive heart failure exacerbation and acute on chronic renal failure. Since  that time, he has had recurrent urinary symptoms. As an outpatient, he has been treated with several antibiotics without success. He was admitted last month with abdominal pain and acute on chronic renal failure. He had too numerous to count white cells and  white blood cell clumps in his urine and his urine culture  Grew ESBL Escherichia coli. Fosfomycin was sensitive - came back after dc. He was discharged on  ertapenm through  PICC with stop date  until 08/13/14 Readmitted with weakness, edema. Is ferbile.  UA and UCX neg. [place] pending. USS neg for DVT in R arm but just had MRI of shoulder due to pain.  He has no signs of aspiration per wife but may be an issue given how groggy he is now.  No diarrhea to suggest C diff. No obvious source of infection  Rec Cont vanco and ertapenem pending culture results.  Await MRI

## 2014-08-12 NOTE — Progress Notes (Signed)
Dr. Marcille Blanco called back with no new orders at this time.

## 2014-08-12 NOTE — Care Management Note (Signed)
Case Management Note  Patient Details  Name: CORRIN HINGLE MRN: [id number] Date of Birth: 1940-11-22  Subjective/Objective:                Presents from home with recent discharge home from  [facility]  w PICC on IV antibiotic therapy for ESBL Ecoli urinary tract infection.   [facility] provided the medication and [facility] was providing nursing.  He has stage IV kidney disease.  Patient admitted with nonsustained VT and currently on Anmiodarone drip.  Palliative care performed consult and made consult for hospice screen.  Agency preference is [facility].  Santiago Glad will meet with patient and his wife.   Action/Plan:   Expected Discharge Date:                  Expected Discharge Plan:     In-House Referral:     Discharge planning Services     Post Acute Care Choice:    Choice offered to:     DME Arranged:    DME Agency:     HH Arranged:    [place] Agency:     Status of Service:     Medicare Important Message Given:    Date Medicare IM Given:    Medicare IM give by:    Date Additional Medicare IM Given:    Additional Medicare Important Message give by:     If discussed at [place] of Stay Meetings, dates discussed:    Additional Comments:  Katrina Stack, RN 08/12/2014, 2:56 PM

## 2014-08-12 NOTE — Consult Note (Signed)
Palliative Medicine Inpatient Consult Follow Up Note   Name: Darrell Richardson Date: 08/12/2014 MRN: [id number]  DOB: January 23, 1941  Referring Physician: Gladstone Lighter, MD  Palliative Care consult requested for this 74 y.o. male for goals of medical therapy in patient with CKD III, CAD, CM, CVA, DM, admitted with A/CKD, NSVT. Pt just hospitalized 4/24-08/08/14 with ESBL producing E.coli. Discharged on ertapenem. Readmitted 08/11/14 with weakness and fatigue. Had NSVT in ER. Now on amiodorone drip. Pt confused. Wife at bedside.      REVIEW OF SYSTEMS:  Patient is not able to provide ROS  CODE STATUS: DNR   PAST MEDICAL HISTORY: Past Medical History  Diagnosis Date  . Hearing loss   . Gout   . Diabetes   . Fatigue   . Hypertension   . Stroke     X 2  . Heart failure   . Muscle pain   . Hammertoe   . Myocardial infarction 1970s  . Chronic combined systolic and diastolic congestive heart failure   . Shortness of breath dyspnea   . Depression   . Anxiety   . CKD (chronic kidney disease), stage IV   . Headache   . Arthritis   . Anemia     PAST SURGICAL HISTORY:  Past Surgical History  Procedure Laterality Date  . Stomach surgery    . Foot surgery Right   . Tee without cardioversion N/A 06/07/2014    Procedure: TRANSESOPHAGEAL ECHOCARDIOGRAM (TEE);  Surgeon: Jolaine Artist, MD;  Location: [facility] ENDOSCOPY;  Service: Cardiovascular;  Laterality: N/A;  . Cholecystectomy      Vital Signs: BP 99/61 mmHg  Pulse 85  Temp(Src) 100.1 F (37.8 C) (Oral)  Resp 21  Ht _0  (1.727 m)  Wt 95.89 kg (211 lb 6.4 oz)  BMI 32.15 kg/m2  SpO2 96% Filed Weights   08/11/14 2008 08/12/14 0040  Weight: 98.431 kg (217 lb) 95.89 kg (211 lb 6.4 oz)    Estimated body mass index is 32.15 kg/(m^2) as calculated from the following:   Height as of this encounter: _1  (1.727 m).   Weight as of this encounter: 95.89 kg (211 lb 6.4 oz).  PHYSICAL EXAM: General appearance: delirious Head:  Normocephalic, without obvious abnormality, atraumatic, OP clear Neck: supple, symmetrical, trachea midline Resp: clear ant fields Cardio: regular rate and rhythm, S1, S2 normal, no murmur, click, rub or gallop GI: soft, non-tender; bowel sounds normal; no masses,  no organomegaly Extremities: trace edema Neurologic: Grossly normal  Psychiatric: confused  LABS: CBC:    Component Value Date/Time   WBC 11.2* 08/12/2014 0903   WBC 5.3 08/03/2014 0525   HGB 9.3* 08/12/2014 0903   HGB 8.1* 08/04/2014 0449   HCT 28.7* 08/12/2014 0903   HCT 26.4* 08/03/2014 0525   PLT 305 08/12/2014 0903   PLT 185 08/03/2014 0525   MCV 86.1 08/12/2014 0903   MCV 88 08/03/2014 0525   NEUTROABS 3.4 08/03/2014 0525   NEUTROABS 7.9* 06/14/2014 0622   LYMPHSABS 0.7* 08/03/2014 0525   LYMPHSABS 1.0 06/14/2014 0622   MONOABS 0.7 08/03/2014 0525   MONOABS 0.8 06/14/2014 0622   EOSABS 0.3 08/03/2014 0525   EOSABS 0.1 06/14/2014 0622   BASOSABS 0.1 08/03/2014 0525   BASOSABS 0.0 06/14/2014 0622   Comprehensive Metabolic Panel:    Component Value Date/Time   NA 138 08/12/2014 0903   NA 138 08/07/2014 0318   K 3.8 08/12/2014 0903   K 4.3 08/07/2014 0318   CL 106 08/12/2014  0903   CL 111 08/07/2014 0318   CO2 22 08/12/2014 0903   CO2 21* 08/07/2014 0318   BUN 34* 08/12/2014 0903   BUN 29* 08/07/2014 0318   CREATININE 2.43* 08/12/2014 0903   CREATININE 1.93* 08/07/2014 0318   GLUCOSE 136* 08/12/2014 0903   GLUCOSE 116* 08/07/2014 0318   CALCIUM 8.4* 08/12/2014 0903   CALCIUM 8.5* 08/07/2014 0318   AST 16 06/14/2014 0622   AST 18 01/05/2012 2134   ALT 26 06/14/2014 0622   ALT 22 01/05/2012 2134   ALKPHOS 67 06/14/2014 0622   ALKPHOS 57 01/05/2012 2134   BILITOT 0.6 06/14/2014 0622   PROT 5.5* 06/14/2014 0622   PROT 6.3* 01/05/2012 2134   ALBUMIN 2.2* 06/14/2014 0622   ALBUMIN 3.1* 01/05/2012 2134    IMPRESSION: Darrell Richardson is a 74 y.o. man with CKD III, CAD, CM, CVA, DM, admitted with  A/CKD, NSVT. Pt just hospitalized 4/24-08/08/14 with ESBL producing E.coli. Discharged on ertapenem. Readmitted 08/11/14 with weakness and altered MS. He also complains of severe R.arm pain. MRI of arm pending.  I met with pt's wife. She describes a decline in pt's condition over the past month. She understands that pt may not return to his previous baseline but is determined to take pt home. We discussed home hospice and wife is in agreement. Hospice screen ordered. Wife confirms that pt would never want dialysis. She also confirms DNR status.  PLAN: Hospice screen  REFERRALS TO BE ORDERED:  Hospice   More than 50% of the visit was spent in counseling/coordination of care: YES  Time spent: 75 minutes

## 2014-08-12 NOTE — Consult Note (Addendum)
Cardiology Consultation Note  Patient ID: Darrell Richardson, MRN: [id number], DOB/AGE: November 29, 1940 74 y.o. Admit date: 08/11/2014   Date of Consult: 08/12/2014 Primary Physician: Margo Common, PA Primary Cardiologist: Dr. Rockey Situ, MD  Chief Complaint: Persistent abdominal pain x 2 weeks Reason for Consult: NSVT x 2 episodes overnight  HPI: 74 y.o. male with h/o CAD with old inferior MI in the 19070s with recent echo in 05/2014 confiriming inferior wall AK, chronic combined systolic and diastolic CHF, history of stroke x 2 in 2009 and 2010, prior history of respiratory failure in February 2016 requiring BiPAP, CKD stage IV, DM2, HTN, and remote tobacco abuse x 20 years who presented to [facility] on 5/4 with a prolonged history of abdominal pain was developed NSVT x approximately 30 seconds x 2 episodes overnight in the ED.   Patient recently discharged from [facility] on 5/1 in the setting of acute cystitis and acute diastolic CHF. Culture grew ESBL E coli. Per ID he was to receive IV Invanz x 14 days via PICC line. He was hydrated in the ED and required diuresis via IV Lasix while admitted.  He has been seeing his PCP x 2 weeks prior to the above admission for lower abdominal pain s/p treatment with Bactrim x 7 days followed by Macrobid x 14 days without significant improvement. He has been referred to Urology.   Patient with recent hospitalization at [facility] in February 2016 for combined systolic and diastolic CHF and respiratory failure requiring BiPAP. Echo during admission showed EF 40-45%, inferior wall akinesis, GR2DD, biatrial enlargement, trace MR, and a small pericardial effusion. His CKD stage IV precluded ischemic evaluation with cardiac cath as the patient and family did not want to be placed/want him placed on dialysis. He ultimately had an extensive work up including autoimmune processes that all turned out to be negative. NM bone scan negative. CT chest negative for acute process. He was diuresed and  discharged to rehab with outpatient follow up. He has general deconditioning, walks with a walker, and is not very active at baseline. In outpatient follow up his rehab weight was 203 at discharge. Weight had recently been as high as 210. He reported worsening foot swelling, itching, and SOB. It was recommended he take his Lasix 80 mg bid for weight more than 207 pounds, otherwise he was to take Lasix 80 mg once daily. His wife reports he had only been needing to take the Lasix 80 mg once daily until Monday 5/2 when his legs had swollen to more than 5 cm. At that time he took 80 mg bid. Since then he has gone back down to Lasix 80 mg daily as his leg swelling has improved, though still remains. He still remains some SOB, though improved from 5/2. No chest pain throughout any of the above.   On 5/4 he continued to have the above abdominal pain prompting him to come back to the ED. The lower extremity swelling and SOB had were improving as above. Upon his arrival to the ED he was found to have a SCr 3.69 (baseline 2.4), troponin negative initially, trended to 0.04. Hgb 9.7 (11.4 back in March 2016, otherwise stable in the 9 range). EKG at 22:10 showed NSR, 83 bpm, nonspecific st/t changes leads I, II, V4, TWI III, aVF, V6, 1 mm st depression V3-V4. EKG taken at 22:24 showed NSR, 100 bpm, TWI I, II, III, aVF, V2-V6. While down in the ED he developed NSVT with a rate of approximately 100-150 x  2 episodes (first occuring at 10:18 PM and the second at 10:41 PM). He reported "blacking out" and never feeling anything like that before. He denies feeling any chest pain. He was started on amiodarone gtt. Labs were checked that showed K+ 4.1 and magnesium 1.7. He was not given any magnesium repletion. Since then he has remained in NSR with a HR in the 90s on telemetry.     Past Medical History  Diagnosis Date  . Hearing loss   . Gout   . Diabetes   . Fatigue   . Hypertension   . Stroke     X 2  . Heart failure     . Muscle pain   . Hammertoe   . Myocardial infarction 1970s  . Chronic combined systolic and diastolic congestive heart failure   . Shortness of breath dyspnea   . Depression   . Anxiety   . CKD (chronic kidney disease), stage IV   . Headache   . Arthritis   . Anemia       Most Recent Cardiac Studies: TTE 05/31/2014:  Study Conclusions  - Left ventricle: The cavity size was mildly dilated. Wall thickness was increased in a pattern of mild LVH. Systolic function was mildly to moderately reduced. The estimated ejection fraction was in the range of 40% to 45%. There is akinesis of the inferolateral and inferior myocardium. Features are consistent with a pseudonormal left ventricular filling pattern, with concomitant abnormal relaxation and increased filling pressure (grade 2 diastolic dysfunction). Doppler parameters are consistent with high ventricular filling pressure. - Left atrium: The atrium was severely dilated. - Right atrium: The atrium was mildly dilated. - Pericardium, extracardiac: A small pericardial effusion was identified.  Impressions:  - Akinesis of the inferior and inferior lateral walls with overall mild to moderate reduction in LV function; grade 2 diastolic dysfunction; biatrial enlargement; trace MR; small pericardial effusion.   Surgical History:  Past Surgical History  Procedure Laterality Date  . Stomach surgery    . Foot surgery Right   . Tee without cardioversion N/A 06/07/2014    Procedure: TRANSESOPHAGEAL ECHOCARDIOGRAM (TEE);  Surgeon: Jolaine Artist, MD;  Location: [facility] ENDOSCOPY;  Service: Cardiovascular;  Laterality: N/A;  . Cholecystectomy       Home Meds: Prior to Admission medications   Medication Sig Start Date End Date Taking? Authorizing Provider  acetaminophen (TYLENOL) 325 MG tablet Take 650 mg by mouth every 6 (six) hours as needed for fever.   Yes Historical Provider, MD  acidophilus (RISAQUAD) CAPS  capsule Take 2 capsules by mouth daily. 06/23/14  Yes Daniel J Angiulli, PA-C  allopurinol (ZYLOPRIM) 100 MG tablet Take 1 tablet (100 mg total) by mouth daily. 06/23/14  Yes Daniel J Angiulli, PA-C  amLODipine (NORVASC) 5 MG tablet Take 1 tablet (5 mg total) by mouth 2 (two) times daily. 08/08/14  Yes Loletha Grayer, MD  Calcium Carbonate-Vitamin D (CALCIUM-VITAMIN D) 500-200 MG-UNIT per tablet Take 1 tablet by mouth daily.   Yes Historical Provider, MD  carvedilol (COREG) 3.125 MG tablet Take 1 tablet (3.125 mg total) by mouth 2 (two) times daily with a meal. 06/23/14  Yes Lavon Paganini Angiulli, PA-C  clopidogrel (PLAVIX) 75 MG tablet Take 1 tablet (75 mg total) by mouth daily with breakfast. 06/23/14  Yes Lavon Paganini Angiulli, PA-C  docusate sodium (COLACE) 100 MG capsule Take 100 mg by mouth at bedtime.   Yes Historical Provider, MD  ertapenem 1 g in sodium chloride 0.9 % 50  mL Inject 1 g into the vein daily. 08/08/14  Yes Loletha Grayer, MD  ferrous sulfate 325 (65 FE) MG tablet Take 1 tablet (325 mg total) by mouth every morning. 08/08/14  Yes Loletha Grayer, MD  furosemide (LASIX) 80 MG tablet Take 1 tablet (80 mg total) by mouth 2 (two) times daily as needed. Patient taking differently: Take 80 mg by mouth 2 (two) times daily as needed for edema.  07/30/14  Yes Minna Merritts, MD  HYDROcodone-acetaminophen (NORCO/VICODIN) 5-325 MG per tablet Take 1-2 tablets by mouth every 4 (four) hours as needed for moderate pain. 06/23/14  Yes Daniel J Angiulli, PA-C  linagliptin (TRADJENTA) 5 MG TABS tablet Take 5 mg by mouth daily.   Yes Historical Provider, MD  Multiple Vitamin (MULTIVITAMIN) tablet Take 1 tablet by mouth daily.   Yes Historical Provider, MD  pregabalin (LYRICA) 75 MG capsule Take 1 capsule (75 mg total) by mouth daily. 06/23/14  Yes Daniel J Angiulli, PA-C  sodium chloride 0.9 % infusion Inject 5 mLs into the vein 2 (two) times daily. 08/08/14  Yes Loletha Grayer, MD    Inpatient Medications:  .  acidophilus  2 capsule Oral Daily  . allopurinol  100 mg Oral Daily  . amLODipine  5 mg Oral BID  . aspirin EC  81 mg Oral Daily  . calcium-vitamin D   Oral Daily  . carvedilol  3.125 mg Oral BID WC  . clopidogrel  75 mg Oral Q breakfast  . docusate sodium  100 mg Oral QHS  . ertapenem [facility]) IV  1 g Intravenous Once  . [START ON 08/13/2014] ertapenem  1 g Intravenous Q24H  . ferrous sulfate  325 mg Oral q morning - 10a  . heparin  5,000 Units Subcutaneous 3 times per day  . insulin aspart  0-5 Units Subcutaneous QHS  . insulin aspart  0-9 Units Subcutaneous TID WC  . LORazepam  1 mg Intravenous Once  . multivitamin  1 tablet Oral Daily  . pregabalin  75 mg Oral Daily  . sodium chloride  3 mL Intravenous Q12H  . vancomycin  1,000 mg Intravenous Once  . vancomycin  1,000 mg Intravenous Once  . [START ON 08/13/2014] vancomycin  1,000 mg Intravenous Q24H   . sodium chloride 50 mL/hr at 08/12/14 0155  . amiodarone 30 mg/hr (08/12/14 0504)    Allergies:  Allergies  Allergen Reactions  . Cortisone Anaphylaxis and Other (See Comments)    Reaction:  Cold, elevated BP, shaking all over, and fever Pts wife states that he tolerates hydrocortisone cream at home.    . Ambien [Zolpidem Tartrate] Other (See Comments)    Reaction:  Pt did not "tolerate well" or remember anything from the previous night.    . Rocephin [Ceftriaxone] Hives  . Levaquin [Levofloxacin In D5w] Rash    History   Social History  . Marital Status: Married    Spouse Name: N/A  . Number of Children: N/A  . Years of Education: N/A   Occupational History  . Not on file.   Social History Main Topics  . Smoking status: Former Research scientist (life sciences)  . Smokeless tobacco: Not on file  . Alcohol Use: No  . Drug Use: No  . Sexual Activity: Not on file   Other Topics Concern  . Not on file   Social History Narrative     Family History  Problem Relation Age of Onset  . Dementia Mother   . Depression Mother  Review of  Systems: Review of Systems  Constitutional: Positive for fever, chills, weight loss and malaise/fatigue. Negative for diaphoresis.       T max 102.2 this morning at 9:10 AM   HENT: Negative for congestion, ear pain, hearing loss, nosebleeds and tinnitus.   Eyes: Negative for blurred vision, double vision, photophobia and pain.  Respiratory: Positive for cough and shortness of breath. Negative for hemoptysis, sputum production and wheezing.   Cardiovascular: Positive for leg swelling. Negative for chest pain, palpitations, orthopnea, claudication and PND.  Gastrointestinal: Positive for nausea and abdominal pain. Negative for heartburn, vomiting, diarrhea, constipation, blood in stool and melena.  Genitourinary: Positive for dysuria, urgency, frequency and flank pain. Negative for hematuria.       Right-sided flank pain Foul smelling urine  Musculoskeletal: Positive for myalgias and back pain. Negative for falls.  Skin: Negative for itching and rash.  Neurological: Positive for dizziness and weakness. Negative for loss of consciousness and headaches.  Psychiatric/Behavioral: Positive for memory loss. The patient is not nervous/anxious.      Labs:  Recent Labs  08/11/14 2015 08/12/14 0303 08/12/14 0903  TROPONINI 0.03 0.04* 0.04*   Lab Results  Component Value Date   WBC 11.2* 08/12/2014   HGB 9.3* 08/12/2014   HCT 28.7* 08/12/2014   MCV 86.1 08/12/2014   PLT 305 08/12/2014     Recent Labs Lab 08/12/14 0903  NA 138  K 3.8  CL 106  CO2 22  BUN 34*  CREATININE 2.43*  CALCIUM 8.4*  GLUCOSE 136*   No results found for: CHOL, HDL, LDLCALC, TRIG No results found for: DDIMER  Radiology/Studies:  Ct Abdomen Pelvis Wo Contrast  08/02/2014   CLINICAL DATA:  Lower abdominal pain, acute on chronic kidney disease, anemia  EXAM: CT ABDOMEN AND PELVIS WITHOUT CONTRAST  TECHNIQUE: Multidetector CT imaging of the abdomen and pelvis was performed following the standard protocol  without IV contrast.  COMPARISON:  06/11/2014  FINDINGS: Lower chest:  Lung bases are essentially clear.  Cardiomegaly.  Hepatobiliary: Unenhanced liver is unremarkable.  Status post cholecystectomy. No intrahepatic or extrahepatic ductal dilatation.  Pancreas: Within normal limits.  Spleen: Within normal limits.  Adrenals/Urinary Tract: Adrenal glands are within normal limits.  Kidneys are notable for nonspecific stranding. Punctate nonobstructing bilateral renal calculi, measuring 1-2 mm in the right upper and lower poles (coronal images 92 and 100) and 1-2 mm in the left interpolar region (coronal image 89) and left lower pole (coronal images 74-75).  No ureteral or bladder calculi.  Thick-walled bladder.  Stomach/Bowel: Stomach is within normal limits.  No evidence of bowel obstruction.  Normal appendix.  Sigmoid diverticulosis, without evidence of diverticulitis.  Vascular/Lymphatic: Atherosclerotic calcifications of the abdominal aorta and branch vessels.  No suspicious abdominopelvic lymphadenopathy.  Reproductive: Prostatomegaly.  Other: No abdominopelvic ascites.  Moderate fat containing right inguinal hernia.  Fat within the left inguinal canal.  Musculoskeletal: Degenerative changes of the visualized thoracolumbar spine.  IMPRESSION: Bilateral nonobstructing renal calculi measuring up to 2 mm. No ureteral or bladder calculi. Mildly thick-walled bladder.  No evidence of bowel obstruction. Normal appendix. Colonic diverticulosis, without evidence of diverticulitis.  Moderate fat containing right inguinal hernia.   Electronically Signed   By: Julian Hy M.D.   On: 08/02/2014 18:51   US Venous Img Upper Uni Right  08/11/2014   CLINICAL DATA:  Right upper extremity pain.  Recent PICC line.  EXAM: Right UPPER EXTREMITY VENOUS DOPPLER ULTRASOUND  TECHNIQUE: Gray-scale sonography with graded  compression, as well as color Doppler and duplex ultrasound were performed to evaluate the upper extremity deep  venous system from the level of the subclavian vein and including the jugular, axillary, basilic, radial, ulnar and upper cephalic vein. Spectral Doppler was utilized to evaluate flow at rest and with distal augmentation maneuvers.  COMPARISON:  Chest radiograph 08/11/2014  FINDINGS: Contralateral Subclavian Vein: Respiratory phasicity is normal and symmetric with the symptomatic side. No evidence of thrombus. Normal compressibility.  Internal Jugular Vein: No evidence of thrombus. Normal compressibility, respiratory phasicity and response to augmentation.  Subclavian Vein: No evidence of thrombus. Normal compressibility, respiratory phasicity and response to augmentation.  Axillary Vein: No evidence of thrombus. Normal compressibility, respiratory phasicity and response to augmentation.  Cephalic Vein: No evidence of thrombus. Normal compressibility, respiratory phasicity and response to augmentation.  Basilic Vein: No evidence of thrombus. Normal compressibility, respiratory phasicity and response to augmentation.  Brachial Veins: No evidence of thrombus. Normal compressibility, respiratory phasicity and response to augmentation.  Radial Veins: No evidence of thrombus. Normal compressibility, respiratory phasicity and response to augmentation.  Ulnar Veins: No evidence of thrombus. Normal compressibility, respiratory phasicity and response to augmentation.  Other Findings: There is a PICC line in the proximal basilic vein extending into the axillary and subclavian vein.  IMPRESSION: No evidence of deep venous thrombosis.   Electronically Signed   By: Suzy Bouchard M.D.   On: 08/11/2014 21:53   Dg Chest Port 1 View  08/11/2014   CLINICAL DATA:  Weakness and right-sided chest pain  EXAM: PORTABLE CHEST - 1 VIEW  COMPARISON:  08/07/2014  FINDINGS: Cardiac shadow is stable. A right-sided PICC line is again noted. The lungs are well aerated with mild right basilar atelectasis. No focal confluent infiltrate is  seen. Degenerative changes of the shoulder joints are noted.  IMPRESSION: Mild right basilar atelectasis.  Stable PICC line.   Electronically Signed   By: Inez Catalina M.D.   On: 08/11/2014 20:32   Dg Chest Port 1 View  08/07/2014   CLINICAL DATA:  Acute onset of shortness of breath. Initial encounter.  EXAM: PORTABLE CHEST - 1 VIEW  COMPARISON:  Chest radiograph performed 08/06/2014  FINDINGS: The lungs are well-aerated. Vascular congestion is noted, with bibasilar airspace opacities, concerning for mild interstitial edema. There is no evidence of pleural effusion or pneumothorax.  The cardiomediastinal silhouette is borderline normal in size. No acute osseous abnormalities are seen. A right PICC is noted ending about the distal SVC.  IMPRESSION: Vascular congestion, with bibasilar airspace opacities, concerning for mild interstitial edema.   Electronically Signed   By: Garald Balding M.D.   On: 08/07/2014 02:50   Dg Chest Port 1 View  08/06/2014   CLINICAL DATA:  PICC line placement.  EXAM: PORTABLE CHEST - 1 VIEW  COMPARISON:  08/01/2014  FINDINGS: Right PICC line is in place. The tip is at the cavoatrial junction. Heart is mildly enlarged. Mild vascular congestion. Mild bibasilar atelectasis, improving since prior study. No effusions. No acute bony abnormality. Degenerative changes in the shoulders bilaterally.  IMPRESSION: Right PICC line tip at the cavoatrial junction.  Improving bibasilar atelectasis.  Mild cardiomegaly, vascular congestion.   Electronically Signed   By: Rolm Baptise M.D.   On: 08/06/2014 10:50   Dg Chest Port 1 View  08/01/2014   CLINICAL DATA:  Dyspnea/short of breath.  EXAM: PORTABLE CHEST - 1 VIEW  COMPARISON:  06/06/2014.  FINDINGS: Cardiopericardial silhouette is enlarged. Perihilar and basilar opacity is  present, most compatible with pulmonary edema and volume overload. Bilateral basilar atelectasis is present. Aortic arch atherosclerosis. Mediastinal contours are unchanged.  Monitoring leads project over the chest.  IMPRESSION: Cardiomegaly and basilar airspace disease most compatible with mild CHF.   Electronically Signed   By: Dereck Ligas M.D.   On: 08/01/2014 17:26   US Kidneys ([facility] Hx)  08/02/2014   CLINICAL DATA:  Acute renal failure  EXAM: RENAL / URINARY TRACT ULTRASOUND COMPLETE  COMPARISON:  None.  FINDINGS: Right Kidney:  Length: 13.0 cm. 1.8 x 1.4 x 1.5 cm upper pole renal cyst. No hydronephrosis.  Left Kidney:  Length: 12.4 cm.  No mass or hydronephrosis.  Bladder:  Within normal limits.  IMPRESSION: 1.8 cm right upper pole renal cyst.  No hydronephrosis.   Electronically Signed   By: Julian Hy M.D.   On: 08/02/2014 17:09    EKG: EKG at 22:10 showed NSR, 83 bpm, nonspecific st/t changes leads I, II, V4, TWI III, aVF, V6, 1 mm st depression V3-V4. EKG taken at 22:24 showed NSR, 100 bpm, TWI I, II, III, aVF, V2-V6.  Weights: Filed Weights   08/11/14 2008 08/12/14 0040  Weight: 217 lb (98.431 kg) 211 lb 6.4 oz (95.89 kg)     Physical Exam: Blood pressure 99/61, pulse 85, temperature 100.1 F (37.8 C), temperature source Oral, resp. rate 21, height 5\' 8"  (1.727 m), weight 211 lb 6.4 oz (95.89 kg), SpO2 96 %. Body mass index is 32.15 kg/(m^2). General: Well developed, well nourished, chronically ill appearing, in no acute distress. Head: Normocephalic, atraumatic, sclera non-icteric, no xanthomas, nares are without discharge. Dry mucus membranes.   Neck: Negative for carotid bruits. JVD not elevated. Lungs: Clear bilaterally to auscultation without wheezes, rales, or rhonchi. Breathing is unlabored. Heart: RRR with S1 S2. No murmurs, rubs, or gallops appreciated. Abdomen: Soft, non-tender, non-distended with normoactive bowel sounds. No hepatomegaly. No rebound/guarding. No obvious abdominal masses. Msk:  Strength and tone appear normal for age. Extremities: No clubbing or cyanosis.  Distal pedal pulses are 2+ and equal bilaterally. 1+  bilateral lower extremity pitting edema to the mid shin.  Neuro: Alert and oriented X 3. No facial asymmetry. No focal deficit. Moves all extremities spontaneously. Psych:  Responds to questions appropriately with a normal affect. Lethargic.     Assessment and Plan:  1. NSVT: -2 episodes occuring overnight while in the ED with a heart rate of approximately 150 -Continue amiodarone gtt at this time given most recently cycled blood pressure of 99/61 (previously 147/57) -Hypotension precludes titration of Coreg any higher than 3.125 mg bid -Would ideally like to advance to po amiodarone when vitals are stable -Check echo to evaluate LV function and wall motion  -Patient, his wife, and their children all do not want cardiac catheterization to evaluate for ischemia. He and they do not want long term dialysis  -Precipitating factor could certainly be ischemia and this was discussed with the patient and his wife. They are aware and wish to defer ischemic evaluation. Other etiology includes acute cystis/pyelonephritis/sepsis/atelectasis    2. Sepsis: -Patient with fever, hypotension, leukocytosis, UTI/pyelonephritis -ID has been consulted  -On ertapenem and vancomycin per IM -May need transfer to CCU and pressors (currently off the floor)  -Poor prognosis   3. Acute on chronic combined CHF: -Apparently had severely lower extremity edema on 5/2, currently improved -SOB is improved -SCr is back at baseline -#2 precludes diuresis at this time  4. Acute on CKD: -Improved -Watch for  ATN in the setting of #2 -They do not want dialysis  5. Anemia: -Stable over the past 2 months -Maintain hgb >8.5   Melvern Banker PA-C 08/12/2014, 11:07 AM   Patient seen and agree with above,  Presentation and plan discussed on rounds.  Amiodarone infusion started for NSVT, No further episodes noted. Echo this PM showing EF 50%, no new findings compared to prior study Cardiac enz neg, though unable  to exclude ischemia. Arrhythmia most likely exacerbated by sepsis/infection. His main complinat is hie right arm, likely a musculoskeletal issue that can be managed as an outpt. Will monitor and can likely change to amiodarone PO tomorrow if tele shows no significant arrhythmia.  Esmond Plants  M.D.

## 2014-08-12 NOTE — Progress Notes (Signed)
[facility] NAME: Darrell Richardson    MR#:  [id number]  DATE OF BIRTH:  July 01, 1940  SUBJECTIVE:  CHIEF COMPLAINT:   Chief Complaint  Patient presents with  . Weakness  . Arm Injury  . Fatigue  . Fever   Patient admitted with weakness, right arm pain. Confused. Wife at bedside. Worried about his overall prognosis due to his recent hospitalisations. Pt has significant pain of right arm, reecnt PICC line- but pain going on for > 6-7 months now- much worse for 2 days. Spiking fevers.  REVIEW OF SYSTEMS:  CONSTITUTIONAL:  Spiking fever as high as 102F. EYES: No blurred or double vision.  EARS, NOSE, AND THROAT: No tinnitus or ear pain.  RESPIRATORY: No cough, shortness of breath, wheezing or hemoptysis.  CARDIOVASCULAR: No chest pain, orthopnea, edema.  GASTROINTESTINAL: No nausea, vomiting, diarrhea or abdominal pain.  GENITOURINARY: No dysuria, hematuria.  ENDOCRINE: No polyuria, nocturia,  HEMATOLOGY: No anemia, easy bruising or bleeding SKIN: No rash or lesion. MUSCULOSKELETAL: No joint pain or arthritis.  EXTREMITIES: 1+ pedal edema, right arm significant pain and tenerness all over.  NEUROLOGIC: prior h/o CVA, with minimal residual right sided weakness, but much weak now and unable to move due to pain. Sensation intact  PSYCHIATRY: No anxiety or depression. Confused, oriented x 3  DRUG ALLERGIES:   Allergies  Allergen Reactions  . Cortisone Anaphylaxis and Other (See Comments)    Reaction:  Cold, elevated BP, shaking all over, and fever Pts wife states that he tolerates hydrocortisone cream at home.    . Ambien [Zolpidem Tartrate] Other (See Comments)    Reaction:  Pt did not "tolerate well" or remember anything from the previous night.    . Rocephin [Ceftriaxone] Hives  . Levaquin [Levofloxacin In D5w] Rash    VITALS:  Blood pressure 147/57, pulse 95, temperature 102.2 F (39 C), temperature source Oral, resp.  rate 17, height 5\' 8"  (1.727 m), weight 95.89 kg (211 lb 6.4 oz), SpO2 98 %.  PHYSICAL EXAMINATION:   GENERAL:  74 y.o.-year-old patient lying in the bed with no acute distress.  EYES: Pupils equal, round, reactive to light and accommodation. No scleral icterus. Extraocular muscles intact.  HEENT: Head atraumatic, normocephalic. Oropharynx and nasopharynx clear.  NECK:  Supple, no jugular venous distention. No thyroid enlargement, no tenderness.  LUNGS: Normal breath sounds bilaterally, no wheezing, rales,rhonchi or crepitation. No use of accessory muscles of respiration. Decreased bibasilar breath sounds CARDIOVASCULAR: S1, S2 normal. No murmurs, rubs, or gallops.  ABDOMEN: Soft, nontender, nondistended. Bowel sounds present. No organomegaly or mass.  EXTREMITIES:1+edal edema, cyanosis, or clubbing.  NEUROLOGIC: Cranial nerves II through XII are intact. Sensation intact. Gait not checked.  Right arm with minimal movement due to pain all over the arm.  worse at the shoulder. Has a PICC line in the arm PSYCHIATRIC: The patient is alert and oriented x 3 but intermittent confusion SKIN: No obvious rash, lesion, or ulcer.    LABORATORY PANEL:   CBC  Recent Labs Lab 08/12/14 0903  WBC 11.2*  HGB 9.3*  HCT 28.7*  PLT 305   ------------------------------------------------------------------------------------------------------------------  Chemistries   Recent Labs Lab 08/11/14 2015 08/12/14 0303  NA 139  --   K 4.1  --   CL 104  --   CO2 22  --   GLUCOSE 121*  --   BUN 36*  --   CREATININE 2.76* 2.54*  CALCIUM 9.0  --  MG  --  1.7   ------------------------------------------------------------------------------------------------------------------  Cardiac Enzymes  Recent Labs Lab 08/12/14 0303  TROPONINI 0.04*   ------------------------------------------------------------------------------------------------------------------  RADIOLOGY:  US Venous Img Upper Uni  Right  08/11/2014   CLINICAL DATA:  Right upper extremity pain.  Recent PICC line.  EXAM: Right UPPER EXTREMITY VENOUS DOPPLER ULTRASOUND  TECHNIQUE: Gray-scale sonography with graded compression, as well as color Doppler and duplex ultrasound were performed to evaluate the upper extremity deep venous system from the level of the subclavian vein and including the jugular, axillary, basilic, radial, ulnar and upper cephalic vein. Spectral Doppler was utilized to evaluate flow at rest and with distal augmentation maneuvers.  COMPARISON:  Chest radiograph 08/11/2014  FINDINGS: Contralateral Subclavian Vein: Respiratory phasicity is normal and symmetric with the symptomatic side. No evidence of thrombus. Normal compressibility.  Internal Jugular Vein: No evidence of thrombus. Normal compressibility, respiratory phasicity and response to augmentation.  Subclavian Vein: No evidence of thrombus. Normal compressibility, respiratory phasicity and response to augmentation.  Axillary Vein: No evidence of thrombus. Normal compressibility, respiratory phasicity and response to augmentation.  Cephalic Vein: No evidence of thrombus. Normal compressibility, respiratory phasicity and response to augmentation.  Basilic Vein: No evidence of thrombus. Normal compressibility, respiratory phasicity and response to augmentation.  Brachial Veins: No evidence of thrombus. Normal compressibility, respiratory phasicity and response to augmentation.  Radial Veins: No evidence of thrombus. Normal compressibility, respiratory phasicity and response to augmentation.  Ulnar Veins: No evidence of thrombus. Normal compressibility, respiratory phasicity and response to augmentation.  Other Findings: There is a PICC line in the proximal basilic vein extending into the axillary and subclavian vein.  IMPRESSION: No evidence of deep venous thrombosis.   Electronically Signed   By: Suzy Bouchard M.D.   On: 08/11/2014 21:53   Dg Chest Port 1  View  08/11/2014   CLINICAL DATA:  Weakness and right-sided chest pain  EXAM: PORTABLE CHEST - 1 VIEW  COMPARISON:  08/07/2014  FINDINGS: Cardiac shadow is stable. A right-sided PICC line is again noted. The lungs are well aerated with mild right basilar atelectasis. No focal confluent infiltrate is seen. Degenerative changes of the shoulder joints are noted.  IMPRESSION: Mild right basilar atelectasis.  Stable PICC line.   Electronically Signed   By: Inez Catalina M.D.   On: 08/11/2014 20:32    EKG:   Orders placed or performed during the hospital encounter of 08/11/14  . ED EKG  . 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 In the emergency department noted to have a run of nonsustained ventricular tachycardia.  * Sepsis- with fevers, tachycardia Blood cultures done, pending. On invanz for recent ESBL E.coli UA this time is not impressive, spiking fevers- added vanc ID consulted May be PICC line needs to be removed  *. Nonsustained ventricular tachycardia: On amiodarone IV, cards consulted Likely change to PO today Also on coreg Known cardiomyopathy EF 40%  * Acute kidney injury on chronic kidney disease: baseline cr 2.1, now at 2.7 on adm Likely ATN from sepsis and prerenal on adm Gentle hydration Consult nephrology, hold diuretics and further nephrotoxic agents  * Right arm pain- unable to move and tender to touch- tender at shoulder mostly, recent  PICC- but u/s negative for any clot. MRI arm ordered Cont lyrica for now.  *. Type 2 diabetes uncomplicated: on insulin sliding scale with every 6 hours Accu-Cheks.  *. Hypertension, essential: on norvasc and coreg  *. Venous thromboembolism prophylactic:  Heparin subcutaneous  Patient is DNR.  wife wondering about goals of care. Palliative care consult ordered    All the records are reviewed and case discussed with Care Management/Social Workerr. Management plans discussed with the patient, family and they are in agreement.  CODE STATUS: DNR  TOTAL TIME TAKING CARE OF THIS PATIENT: 40 minutes.   POSSIBLE D/C IN 2-3  DAYS, DEPENDING ON CLINICAL CONDITION.   Gladstone Lighter M.D on 08/12/2014 at 10:23 AM  Between 7am to 6pm - Pager - [phone]  After 6pm go to [email or web address] - password [email or web address]  Office  [phone]  CC: Primary care physician; Chrismon, DENNIS E, PA

## 2014-08-12 NOTE — Progress Notes (Signed)
Pt is a&o, NSR on tele, Amio drip maintained. Pt went down for MRI of R Arm due to complaints of pain. Ativan given prior to exam and pt has rested remainder of the day. VSS, will continue to assess

## 2014-08-12 NOTE — Progress Notes (Signed)
History: chronic kidney disease, DM II, hyperlipidemia, CAD  High fall risk bundle, yellow wrist band, yellow socks, bed exit, fall sign and hourly toileting offered on rounding.  Alert and oriented. Rested quietly through the night. No signs or c/o pain noted. No acute distress observed. Urinated clear amber urine. Amarodian drip continues.  Still dyspneic on exertion. No SOB noted. Will continue to monitor pt.

## 2014-08-12 NOTE — Consult Note (Signed)
[facility]  CONSULT NOTE    Date: 08/12/2014                  Patient Name:  Darrell Richardson  MRN: [id number]  DOB: 1940-04-13  Age / Sex: 74 y.o., male         PCP: Chrismon, DENNIS E, PA                 Service Requesting Consult: Dr. Lavetta Nielsen, [facility]                 Reason for Consult: Acute Renal Failure on chronic kidney disease stage III.            History of Present Illness: Darrell Richardson is a 74 y.o. white male with diabetes mellitus type II, diabetic neuropathy, hypertension, CVA, hyperlipidemia, congestive heart failure, coronary artery disease, gout, depression, bowel resection and cholecystectomy.  Patient was admitted to [facility]  08/11/2014 for increasing weakness. Patient was admitted to [facility] from 4/24- 5/1 for urinary tract infection. He was sent home with PICC line and meropenem. However he returns with confusion, right arm pain, and developed ventricular tachycardia in the emergency room.   Wife is at bedside who assists with history taking. Patient is somewhat confused and is hard of hearing. Denies any pain with urination. Denies any shortness of breath.   Patient was referred to nephrology several times but has missed these appointments. Currently has an appointment with Dr. Holley Raring on 5/13. Baseline creatinine of 2.1, eGFR of 30 from 10/12/2013.    Medications: Outpatient medications: Prescriptions prior to admission  Medication Sig Dispense Refill Last Dose  . acetaminophen (TYLENOL) 325 MG tablet Take 650 mg by mouth every 6 (six) hours as needed for fever.   08/11/2014 at 1700  . acidophilus (RISAQUAD) CAPS capsule Take 2 capsules by mouth daily. 30 capsule 0 08/11/2014 at unknown  . allopurinol (ZYLOPRIM) 100 MG tablet Take 1 tablet (100 mg total) by mouth daily. 30 tablet 1 08/11/2014 at 0900  . amLODipine (NORVASC) 5 MG tablet Take 1 tablet (5 mg total) by mouth 2 (two) times daily. 60 tablet 0 08/11/2014 at 0900  . Calcium Carbonate-Vitamin  D (CALCIUM-VITAMIN D) 500-200 MG-UNIT per tablet Take 1 tablet by mouth daily.   Past Week at unknown  . carvedilol (COREG) 3.125 MG tablet Take 1 tablet (3.125 mg total) by mouth 2 (two) times daily with a meal. 60 tablet 1 08/11/2014 at 0830  . clopidogrel (PLAVIX) 75 MG tablet Take 1 tablet (75 mg total) by mouth daily with breakfast. 30 tablet 1 08/11/2014 at unknown  . docusate sodium (COLACE) 100 MG capsule Take 100 mg by mouth at bedtime.   08/11/2014 at unknown  . ertapenem 1 g in sodium chloride 0.9 % 50 mL Inject 1 g into the vein daily. 11 g 0 08/11/2014 at unknown  . ferrous sulfate 325 (65 FE) MG tablet Take 1 tablet (325 mg total) by mouth every morning. 30 tablet 0 08/11/2014 at unknown  . furosemide (LASIX) 80 MG tablet Take 1 tablet (80 mg total) by mouth 2 (two) times daily as needed. (Patient taking differently: Take 80 mg by mouth 2 (two) times daily as needed for edema. ) 60 tablet 6 08/11/2014 at unknown   . HYDROcodone-acetaminophen (NORCO/VICODIN) 5-325 MG per tablet Take 1-2 tablets by mouth every 4 (four) hours as needed for moderate pain. 90 tablet 0 08/11/2014 at 1500  . linagliptin (TRADJENTA) 5 MG TABS  tablet Take 5 mg by mouth daily.   08/11/2014 at Unknown time  . Multiple Vitamin (MULTIVITAMIN) tablet Take 1 tablet by mouth daily.   08/11/2014 at unknown  . pregabalin (LYRICA) 75 MG capsule Take 1 capsule (75 mg total) by mouth daily. 30 capsule 1 08/11/2014 at unknown  . sodium chloride 0.9 % infusion Inject 5 mLs into the vein 2 (two) times daily. 120 mL 0 08/11/2014 at unknown    Current medications: Current Facility-Administered Medications  Medication Dose Route Frequency Provider Last Rate Last Dose  . 0.9 %  sodium chloride infusion   Intravenous Continuous Lytle Butte, MD 50 mL/hr at 08/12/14 0155    . acetaminophen (TYLENOL) tablet 650 mg  650 mg Oral Q6H PRN Lytle Butte, MD   650 mg at 08/12/14 6073   Or  . acetaminophen (TYLENOL) suppository 650 mg  650 mg Rectal Q6H PRN  Lytle Butte, MD      . acidophilus (RISAQUAD) capsule 2 capsule  2 capsule Oral Daily Lytle Butte, MD      . allopurinol (ZYLOPRIM) tablet 100 mg  100 mg Oral Daily Lytle Butte, MD   100 mg at 08/12/14 0915  . amiodarone (NEXTERONE PREMIX) 360 MG/200ML (1.8 mg/mL) IV infusion  30 mg/hr Intravenous Continuous Lytle Butte, MD 16.7 mL/hr at 08/12/14 0504 30 mg/hr at 08/12/14 0504  . amLODipine (NORVASC) tablet 5 mg  5 mg Oral BID Lytle Butte, MD   5 mg at 08/12/14 7106  . aspirin EC tablet 81 mg  81 mg Oral Daily Lytle Butte, MD   81 mg at 08/12/14 0915  . calcium-vitamin D (OSCAL WITH D) 500-200 MG-UNIT per tablet   Oral Daily Lytle Butte, MD   1 tablet at 08/12/14 0914  . carvedilol (COREG) tablet 3.125 mg  3.125 mg Oral BID WC Lytle Butte, MD   3.125 mg at 08/12/14 0914  . clopidogrel (PLAVIX) tablet 75 mg  75 mg Oral Q breakfast Lytle Butte, MD   75 mg at 08/12/14 0914  . docusate sodium (COLACE) capsule 100 mg  100 mg Oral QHS Lytle Butte, MD   100 mg at 08/12/14 2694  . ertapenem (INVANZ) 1 g in sodium chloride 0.9 % 50 mL IVPB  1 g Intravenous Once Lytle Butte, MD      . Derrill Memo ON 08/13/2014] ertapenem [facility]) 1 g in sodium chloride 0.9 % 50 mL IVPB  1 g Intravenous Q24H Lytle Butte, MD      . ferrous sulfate tablet 325 mg  325 mg Oral q morning - 10a Lytle Butte, MD   325 mg at 08/12/14 0915  . heparin injection 5,000 Units  5,000 Units Subcutaneous 3 times per day Lytle Butte, MD   5,000 Units at 08/12/14 [phone]  . HYDROcodone-acetaminophen (NORCO/VICODIN) 5-325 MG per tablet 1-2 tablet  1-2 tablet Oral Q4H PRN Lytle Butte, MD   1 tablet at 08/12/14 0915  . insulin aspart (novoLOG) injection 0-5 Units  0-5 Units Subcutaneous QHS Lytle Butte, MD   0 Units at 08/12/14 0030  . insulin aspart (novoLOG) injection 0-9 Units  0-9 Units Subcutaneous TID WC Lytle Butte, MD   1 Units at 08/12/14 [phone]  . morphine 2 MG/ML injection 2 mg  2 mg Intravenous Q4H PRN Lytle Butte, MD      . multivitamin tablet 1 tablet  1 tablet Oral Daily  Lytle Butte, MD      . ondansetron [facility]) tablet 4 mg  4 mg Oral Q6H PRN Lytle Butte, MD       Or  . ondansetron [facility]) injection 4 mg  4 mg Intravenous Q6H PRN Lytle Butte, MD      . pregabalin (LYRICA) capsule 75 mg  75 mg Oral Daily Lytle Butte, MD   75 mg at 08/12/14 9379  . sodium chloride 0.9 % injection 3 mL  3 mL Intravenous Q12H Lytle Butte, MD   3 mL at 08/12/14 0915      Allergies: Allergies  Allergen Reactions  . Cortisone Anaphylaxis and Other (See Comments)    Reaction:  Cold, elevated BP, shaking all over, and fever Pts wife states that he tolerates hydrocortisone cream at home.    . Ambien [Zolpidem Tartrate] Other (See Comments)    Reaction:  Pt did not "tolerate well" or remember anything from the previous night.    . Rocephin [Ceftriaxone] Hives  . Levaquin [Levofloxacin In D5w] Rash      Past Medical History: Past Medical History  Diagnosis Date  . Hearing loss   . Gout   . Diabetes   . Fatigue   . Hypertension   . Stroke     X 2  . Heart failure   . Muscle pain   . Hammertoe   . Myocardial infarction   . CHF (congestive heart failure)   . Shortness of breath dyspnea   . Depression   . Anxiety   . Chronic kidney disease   . Headache   . Arthritis   . Anemia      Past Surgical History: Past Surgical History  Procedure Laterality Date  . Stomach surgery    . Foot surgery Right   . Tee without cardioversion N/A 06/07/2014    Procedure: TRANSESOPHAGEAL ECHOCARDIOGRAM (TEE);  Surgeon: Jolaine Artist, MD;  Location: [facility] ENDOSCOPY;  Service: Cardiovascular;  Laterality: N/A;  . Cholecystectomy       Family History: Family History  Problem Relation Age of Onset  . Dementia Mother   . Depression Mother      Social History: History   Social History  . Marital Status: Married    Spouse Name: N/A  . Number of Children: N/A  . Years of Education: N/A    Occupational History  . Not on file.   Social History Main Topics  . Smoking status: Former Research scientist (life sciences)  . Smokeless tobacco: Not on file  . Alcohol Use: No  . Drug Use: No  . Sexual Activity: Not on file   Other Topics Concern  . Not on file   Social History Narrative     Review of Systems: Review of Systems  Constitutional: Positive for fever, chills, weight loss and malaise/fatigue.  HENT: Positive for hearing loss. Negative for congestion, ear discharge, ear pain, nosebleeds, sore throat and tinnitus.   Eyes: Negative for blurred vision, double vision, photophobia, pain, discharge and redness.  Respiratory: Negative for cough, hemoptysis, sputum production, shortness of breath, wheezing and stridor.   Cardiovascular: Negative for chest pain, palpitations, orthopnea, claudication, leg swelling and PND.  Gastrointestinal: Positive for constipation. Negative for heartburn, nausea, vomiting, abdominal pain, diarrhea, blood in stool and melena.  Genitourinary: Negative for dysuria, urgency, frequency, hematuria and flank pain.  Musculoskeletal: Positive for myalgias and falls.  Skin: Negative for itching and rash.  Neurological: Positive for tingling and weakness. Negative for dizziness, tremors,  sensory change, speech change, focal weakness, seizures, loss of consciousness and headaches.  Endo/Heme/Allergies: Negative.   Psychiatric/Behavioral: Positive for depression. Negative for suicidal ideas, hallucinations, memory loss and substance abuse. The patient is not nervous/anxious and does not have insomnia.     Vital Signs: Blood pressure 147/57, pulse 95, temperature 102.2 F (39 C), temperature source Oral, resp. rate 17, height _0  (1.727 m), weight 95.89 kg (211 lb 6.4 oz), SpO2 98 %.  Weight trends: Filed Weights   08/11/14 2008 08/12/14 0040  Weight: 98.431 kg (217 lb) 95.89 kg (211 lb 6.4 oz)   Physical Exam: General: NAD,   Head: Normocephalic, atraumatic. Moist  oral mucosal membranes  Eyes: Anicteric, PERRL  Neck: Supple, trachea midline  Lungs:  Clear to auscultation  Heart: Regular rate and rhythm  Abdomen:  Soft, distended  Extremities: no peripheral edema.  Neurologic: Nonfocal, moving all four extremities  Skin: No lesions  Access: Right arm PICC   Lab results: Basic Metabolic Panel:  Recent Labs Lab 08/07/14 0318 08/08/14 0409 08/11/14 2015 08/12/14 0303  NA 138 139 139  --   K 4.3 4.0 4.1  --   CL 111 111 104  --   CO2 21* 21* 22  --   GLUCOSE 116* 117* 121*  --   BUN 29* 26* 36*  --   CREATININE 1.93* 1.85* 2.76* 2.54*  CALCIUM 8.5* 8.6* 9.0  --   MG  --   --   --  1.7    Liver Function Tests: No results for input(s): AST, ALT, ALKPHOS, BILITOT, PROT, ALBUMIN in the last 168 hours. No results for input(s): LIPASE, AMYLASE in the last 168 hours. No results for input(s): AMMONIA in the last 168 hours.  CBC:  Recent Labs Lab 08/08/14 0409 08/11/14 2015 08/12/14 0303  WBC  --  7.2 8.8  HGB  --  9.7* 9.2*  HCT  --  29.0* 27.9*  MCV  --  88.9 88.4  PLT 256 310 292    Cardiac Enzymes:  Recent Labs Lab 08/11/14 2015 08/12/14 0303  TROPONINI 0.03 0.04*    BNP: Invalid input(s): POCBNP  CBG:  Recent Labs Lab 08/08/14 0823 08/08/14 1129 08/12/14 0727  GLUCAP 148* 95 126*    Microbiology: Results for orders placed or performed during the hospital encounter of 08/11/14  Blood culture (routine x 2)     Status: None (Preliminary result)   Collection Time: 08/11/14  8:15 PM  Result Value Ref Range Status   Specimen Description BLOOD  Final   Special Requests NONE  Final   Culture NO GROWTH < 12 HOURS  Final   Report Status PENDING  Incomplete  Blood culture (routine x 2)     Status: None (Preliminary result)   Collection Time: 08/11/14  8:16 PM  Result Value Ref Range Status   Specimen Description BLOOD  Final   Special Requests NONE  Final   Culture NO GROWTH < 12 HOURS  Final   Report Status  PENDING  Incomplete    Coagulation Studies: No results for input(s): LABPROT, INR in the last 72 hours.  Urinalysis:  Recent Labs  08/11/14 2011  COLORURINE YELLOW*  LABSPEC 1.011  PHURINE 5.0  GLUCOSEU 150*  HGBUR 2+*  BILIRUBINUR NEGATIVE  KETONESUR NEGATIVE  PROTEINUR >500*  NITRITE NEGATIVE  LEUKOCYTESUR NEGATIVE      Imaging: US Venous Img Upper Uni Right  08/11/2014   CLINICAL DATA:  Right upper extremity pain.  Recent PICC  line.  EXAM: Right UPPER EXTREMITY VENOUS DOPPLER ULTRASOUND  TECHNIQUE: Gray-scale sonography with graded compression, as well as color Doppler and duplex ultrasound were performed to evaluate the upper extremity deep venous system from the level of the subclavian vein and including the jugular, axillary, basilic, radial, ulnar and upper cephalic vein. Spectral Doppler was utilized to evaluate flow at rest and with distal augmentation maneuvers.  COMPARISON:  Chest radiograph 08/11/2014  FINDINGS: Contralateral Subclavian Vein: Respiratory phasicity is normal and symmetric with the symptomatic side. No evidence of thrombus. Normal compressibility.  Internal Jugular Vein: No evidence of thrombus. Normal compressibility, respiratory phasicity and response to augmentation.  Subclavian Vein: No evidence of thrombus. Normal compressibility, respiratory phasicity and response to augmentation.  Axillary Vein: No evidence of thrombus. Normal compressibility, respiratory phasicity and response to augmentation.  Cephalic Vein: No evidence of thrombus. Normal compressibility, respiratory phasicity and response to augmentation.  Basilic Vein: No evidence of thrombus. Normal compressibility, respiratory phasicity and response to augmentation.  Brachial Veins: No evidence of thrombus. Normal compressibility, respiratory phasicity and response to augmentation.  Radial Veins: No evidence of thrombus. Normal compressibility, respiratory phasicity and response to augmentation.   Ulnar Veins: No evidence of thrombus. Normal compressibility, respiratory phasicity and response to augmentation.  Other Findings: There is a PICC line in the proximal basilic vein extending into the axillary and subclavian vein.  IMPRESSION: No evidence of deep venous thrombosis.   Electronically Signed   By: Suzy Bouchard M.D.   On: 08/11/2014 21:53   Dg Chest Port 1 View  08/11/2014   CLINICAL DATA:  Weakness and right-sided chest pain  EXAM: PORTABLE CHEST - 1 VIEW  COMPARISON:  08/07/2014  FINDINGS: Cardiac shadow is stable. A right-sided PICC line is again noted. The lungs are well aerated with mild right basilar atelectasis. No focal confluent infiltrate is seen. Degenerative changes of the shoulder joints are noted.  IMPRESSION: Mild right basilar atelectasis.  Stable PICC line.   Electronically Signed   By: Inez Catalina M.D.   On: 08/11/2014 20:32      Assessment & Plan: Mr. Barreiro is a 74 y.o. white male with diabetes mellitus type II, diabetic neuropathy, hypertension, CVA, hyperlipidemia, congestive heart failure, coronary artery disease, gout, depression, bowel resection and cholecystectomy.  Mr. Olarte is a 74 y.o. white male with diabetes mellitus type II, diabetic neuropathy, hypertension, CVA, hyperlipidemia, congestive heart failure, coronary artery disease, gout, depression, bowel resection and cholecystectomy.  1. Acute renal failure on chronic kidney disease stage III: acute renal failure seems secondary to prerenal azotemia secondary to poor PO intake due to underlying urinary tract infection.  Baseline creatinine of 2.1, eGFR of 30. Chronic kidney disease is secondary to diabetic nephropathy.  - Agree with gentle IV fluids. Continue to monitor volume status, urine output and serum electrolytes. - Renal ultrasound reviewed from 2/16. Consistent with CKD.   2. Urinary Tract Infection: empiric antibiotics: ertapenem and vanocmycin - cultures pending.  - Consult ID  3.  Ventricular tachycardia: now on amiodarone gtt. Appreciate cards input.   4. Hypertension: elevated, but now better controlled.  Currently on amlodipine, carvedilol  5. Diabetes Mellitus type II with renal manifestions: continue glucose control.   6. Anemia of chronic kidney disease: hemoglobin of 9.3.  - continue to monitor, no acute indication for epo.

## 2014-08-12 NOTE — H&P (Signed)
[facility] NAME: Darrell Richardson    MR#:  [id number]  DATE OF BIRTH:  Mar 19, 1941   DATE OF ADMISSION:  08/11/2014  PRIMARY CARE PHYSICIAN: Chrismon, DENNIS E, PA   REQUESTING/REFERRING PHYSICIAN: Schaevitz  CHIEF COMPLAINT:   Chief Complaint  Patient presents with  . Weakness  . Arm Injury  . Fatigue  . Fever    HISTORY OF PRESENT ILLNESS:  Darrell Richardson  is a 74 y.o. 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 Who is presenting with generalized weakness and fatigue progressively worsening for 2 days' duration. Patient is somewhat poor historian history aided from wife who is at bedside. She states that the patient has had gradually worsening weakness to the point where he could not ambulate today. He has also been complaining of right-sided shoulder pain 6-7 out of 10 in intensity, pain, nonradiating, worse with activity, no relieving factors. With the gradually worsening weakness decided to present to the hospital for further workup and evaluation. In the emergency department he was noted to have a run of nonsustained VT, lasting approximately 30 seconds. He was subsequently started on amiodarone infusion.  PAST MEDICAL HISTORY:   Past Medical History  Diagnosis Date  . Hearing loss   . Gout   . Diabetes   . Fatigue   . Hypertension   . Stroke     X 2  . Heart failure   . Muscle pain   . Hammertoe     PAST SURGICAL HISTORY:   Past Surgical History  Procedure Laterality Date  . Stomach surgery    . Foot surgery Right   . Tee without cardioversion N/A 06/07/2014    Procedure: TRANSESOPHAGEAL ECHOCARDIOGRAM (TEE);  Surgeon: Jolaine Artist, MD;  Location: [facility];  Service: Cardiovascular;  Laterality: N/A;  . Cholecystectomy      SOCIAL HISTORY:   History  Substance Use Topics   . Smoking status: Former Research scientist (life sciences)  . Smokeless tobacco: Not on file  . Alcohol Use: No    FAMILY HISTORY:   Family History  Problem Relation Age of Onset  . Dementia Mother   . Depression Mother     DRUG ALLERGIES:   Allergies  Allergen Reactions  . Cortisone Anaphylaxis and Other (See Comments)    Reaction:  Cold, elevated BP, shaking all over, and fever Pts wife states that he tolerates hydrocortisone cream at home.    . Ambien [Zolpidem Tartrate] Other (See Comments)    Reaction:  Pt did not "tolerate well" or remember anything from the previous night.    . Rocephin [Ceftriaxone] Hives  . Levaquin [Levofloxacin In D5w] Rash    REVIEW OF SYSTEMS:  REVIEW OF SYSTEMS:  CONSTITUTIONAL: Denies fevers, chills, positive for fatigue, weakness.  EYES: Denies blurred vision, double vision, or eye pain.  EARS, NOSE, THROAT: Denies tinnitus, ear pain, hearing loss.  RESPIRATORY: denies cough, shortness of breath, wheezing  CARDIOVASCULAR: Denies chest pain, palpitations, positive for edema.  GASTROINTESTINAL: Denies nausea, vomiting, diarrhea, abdominal pain.  GENITOURINARY: Denies dysuria, hematuria.  ENDOCRINE: Denies nocturia or thyroid problems. HEMATOLOGIC AND LYMPHATIC: Denies easy bruising or bleeding.  SKIN: Denies rash or lesions.  MUSCULOSKELETAL: Positive right shoulder pain, Denies pain in neck, back, knees, hips, or further arthritic symptoms.  NEUROLOGIC: Denies paralysis, paresthesias.  PSYCHIATRIC: Denies anxiety or depressive symptoms. Otherwise full  review of systems performed by me is negative.   MEDICATIONS AT HOME:   Prior to Admission medications   Medication Sig Start Date End Date Taking? Authorizing Provider  acetaminophen (TYLENOL) 325 MG tablet Take 650 mg by mouth every 6 (six) hours as needed for fever.   Yes Historical Provider, MD  acidophilus (RISAQUAD) CAPS capsule Take 2 capsules by mouth daily. 06/23/14  Yes Daniel J Angiulli, PA-C   allopurinol (ZYLOPRIM) 100 MG tablet Take 1 tablet (100 mg total) by mouth daily. 06/23/14  Yes Daniel J Angiulli, PA-C  amLODipine (NORVASC) 5 MG tablet Take 1 tablet (5 mg total) by mouth 2 (two) times daily. 08/08/14  Yes Loletha Grayer, MD  Calcium Carbonate-Vitamin D (CALCIUM-VITAMIN D) 500-200 MG-UNIT per tablet Take 1 tablet by mouth daily.   Yes Historical Provider, MD  carvedilol (COREG) 3.125 MG tablet Take 1 tablet (3.125 mg total) by mouth 2 (two) times daily with a meal. 06/23/14  Yes Lavon Paganini Angiulli, PA-C  clopidogrel (PLAVIX) 75 MG tablet Take 1 tablet (75 mg total) by mouth daily with breakfast. 06/23/14  Yes Lavon Paganini Angiulli, PA-C  docusate sodium (COLACE) 100 MG capsule Take 100 mg by mouth at bedtime.   Yes Historical Provider, MD  ertapenem 1 g in sodium chloride 0.9 % 50 mL Inject 1 g into the vein daily. 08/08/14  Yes Loletha Grayer, MD  ferrous sulfate 325 (65 FE) MG tablet Take 1 tablet (325 mg total) by mouth every morning. 08/08/14  Yes Loletha Grayer, MD  furosemide (LASIX) 80 MG tablet Take 1 tablet (80 mg total) by mouth 2 (two) times daily as needed. Patient taking differently: Take 80 mg by mouth 2 (two) times daily as needed for edema.  07/30/14  Yes Minna Merritts, MD  HYDROcodone-acetaminophen (NORCO/VICODIN) 5-325 MG per tablet Take 1-2 tablets by mouth every 4 (four) hours as needed for moderate pain. 06/23/14  Yes Daniel J Angiulli, PA-C  linagliptin (TRADJENTA) 5 MG TABS tablet Take 5 mg by mouth daily.   Yes Historical Provider, MD  Multiple Vitamin (MULTIVITAMIN) tablet Take 1 tablet by mouth daily.   Yes Historical Provider, MD  pregabalin (LYRICA) 75 MG capsule Take 1 capsule (75 mg total) by mouth daily. 06/23/14  Yes Daniel J Angiulli, PA-C  sodium chloride 0.9 % infusion Inject 5 mLs into the vein 2 (two) times daily. 08/08/14  Yes Loletha Grayer, MD      VITAL SIGNS:  Blood pressure 158/76, pulse 89, temperature 98.5 F (36.9 C), temperature source Oral,  resp. rate 18, height 5\' 8"  (1.727 m), weight 217 lb (98.431 kg), SpO2 100 %.  PHYSICAL EXAMINATION:  VITAL SIGNS: Filed Vitals:   08/11/14 2313  BP: 158/76  Pulse: 89  Temp:   Resp: 18   GENERAL:74 y.o.male who appears chronically ill.  HEAD: Normocephalic, atraumatic.  EYES: Pupils equal, round, reactive to light. Extraocular muscles intact. No scleral icterus.  MOUTH: Dry mucosal membrane. Dentition poor. No abscess noted.  EAR, NOSE, THROAT: Clear without exudates. No external lesions.  NECK: Supple. No thyromegaly. No nodules. No JVD.  PULMONARY: Clear to ascultation, without wheeze rails or rhonci. No use of accessory muscles, Good respiratory effort. good air entry bilaterally CHEST: Nontender to palpation.  CARDIOVASCULAR: S1 and S2. Regular rate and rhythm. No murmurs, rubs, or gallops. 2+ edema to shins bilaterally. Pedal pulses 2+ bilaterally.  GASTROINTESTINAL: Soft, nontender, nondistended. No masses. Positive bowel sounds. No hepatosplenomegaly.  MUSCULOSKELETAL: No swelling, clubbing, or edema. Range  of motion full in all extremities.  NEUROLOGIC: Cranial nerves II through XII are intact. No gross focal neurological deficits. Sensation intact. Reflexes intact.  SKIN: No ulceration, lesions, rashes, or cyanosis. Skin warm and dry. Turgor intact.  PSYCHIATRIC: Mood, affect within normal limits. The patient is awake, alert and oriented x 3. Insight, judgment intact.    LABORATORY PANEL:   CBC  Recent Labs Lab 08/11/14 2015  WBC 7.2  HGB 9.7*  HCT 29.0*  PLT 310   ------------------------------------------------------------------------------------------------------------------  Chemistries   Recent Labs Lab 08/11/14 2015  NA 139  K 4.1  CL 104  CO2 22  GLUCOSE 121*  BUN 36*  CREATININE 2.76*  CALCIUM 9.0   ------------------------------------------------------------------------------------------------------------------  Cardiac Enzymes  Recent  Labs Lab 08/11/14 2015  TROPONINI 0.03   ------------------------------------------------------------------------------------------------------------------  RADIOLOGY:  US Venous Img Upper Uni Right  08/11/2014   CLINICAL DATA:  Right upper extremity pain.  Recent PICC line.  EXAM: Right UPPER EXTREMITY VENOUS DOPPLER ULTRASOUND  TECHNIQUE: Gray-scale sonography with graded compression, as well as color Doppler and duplex ultrasound were performed to evaluate the upper extremity deep venous system from the level of the subclavian vein and including the jugular, axillary, basilic, radial, ulnar and upper cephalic vein. Spectral Doppler was utilized to evaluate flow at rest and with distal augmentation maneuvers.  COMPARISON:  Chest radiograph 08/11/2014  FINDINGS: Contralateral Subclavian Vein: Respiratory phasicity is normal and symmetric with the symptomatic side. No evidence of thrombus. Normal compressibility.  Internal Jugular Vein: No evidence of thrombus. Normal compressibility, respiratory phasicity and response to augmentation.  Subclavian Vein: No evidence of thrombus. Normal compressibility, respiratory phasicity and response to augmentation.  Axillary Vein: No evidence of thrombus. Normal compressibility, respiratory phasicity and response to augmentation.  Cephalic Vein: No evidence of thrombus. Normal compressibility, respiratory phasicity and response to augmentation.  Basilic Vein: No evidence of thrombus. Normal compressibility, respiratory phasicity and response to augmentation.  Brachial Veins: No evidence of thrombus. Normal compressibility, respiratory phasicity and response to augmentation.  Radial Veins: No evidence of thrombus. Normal compressibility, respiratory phasicity and response to augmentation.  Ulnar Veins: No evidence of thrombus. Normal compressibility, respiratory phasicity and response to augmentation.  Other Findings: There is a PICC line in the proximal basilic vein  extending into the axillary and subclavian vein.  IMPRESSION: No evidence of deep venous thrombosis.   Electronically Signed   By: Suzy Bouchard M.D.   On: 08/11/2014 21:53   Dg Chest Port 1 View  08/11/2014   CLINICAL DATA:  Weakness and right-sided chest pain  EXAM: PORTABLE CHEST - 1 VIEW  COMPARISON:  08/07/2014  FINDINGS: Cardiac shadow is stable. A right-sided PICC line is again noted. The lungs are well aerated with mild right basilar atelectasis. No focal confluent infiltrate is seen. Degenerative changes of the shoulder joints are noted.  IMPRESSION: Mild right basilar atelectasis.  Stable PICC line.   Electronically Signed   By: Inez Catalina M.D.   On: 08/11/2014 20:32    EKG:   Orders placed or performed during the hospital encounter of 08/11/14  . ED EKG  . 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 In the emergency department noted to have a run of nonsustained ventricular tachycardia.  1. Nonsustained ventricular tachycardia: He has been started on amiodarone infusion by the emergency department staff. Check  magnesium levels, with goal potassium 4-5 and magnesium greater than 2, placed on telemetry, trend cardiac enzymes 3 consult cardiology follows with Dr. Rockey Situ.  2. Acute kidney injury on chronic kidney disease: Consult nephrology, hold diuretics and further nephrotoxic agents, provide gentle IV fluid hydration.  3. Type 2 diabetes uncomplicated: Hold by mouth agents and insulin sliding scale with every 6 hours Accu-Cheks.  4. Hypertension, essential: Continue home medications, hold diuretics as stated above.  5. Venous thromboembolism prophylactic: Heparin subcutaneous    All the records are reviewed and case discussed with ED provider. Management plans discussed with the patient, family and they are in  agreement.  CODE STATUS: Full code  TOTAL TIME TAKING CARE OF THIS PATIENT: 45  minutes.    Hower,  Karenann Cai.D on 08/12/2014 at 12:07 AM     [facility]  Office  [phone]  CC: Primary care physician; Chrismon, Vickki Muff, PA

## 2014-08-12 NOTE — Progress Notes (Signed)
New referral for [facility] services at home after discharge following a Palliative Care consult. Notified by CM Joni Reining. Patient is a 74 yo old man admitted on 08/11/14 with weakness, fatigue and confusion. He was found to have  NSVT in ED. He is now on an amiodorone drip and IV antibiotics, this is his 4th admission in 6 months. His last admission was 4-24-5/1 with treatment requiring discharge on IV antibiotics.  He has a PMH of DM II, HTN, CHF w/ejection fraction of 40-45% (05/2014), CKD stage IV(does not want dialysis), MI, CVA, Arthritis, gout and anemia. He is a DNR code. Writer met with patient's wife Matias Thurman in the room, also present was a cousin, Anne Ng. Education initiated regarding hospice services, philosophy and team approach to care with good understanding voiced by Vickii Chafe. She also had questions regarding services at the [facility]. Questions answered, hospice information and contact information given. DME needs to be determined prior to discharge. Patient remained asleep through out the visit, breathing dyssynchronous. Per chart review and Vickii Chafe he was given lorazepam prior to his MRI earlier in the day. Hospice Liaison to follow through discharge to assess for further patient/family needs. CM and Dr. Ermalinda Memos made aware of visit. Information faxed to referral intake.Thank you for the opportunity to serve Darrell Richardson and his family. Flo Shanks RN, BSN, [facility] [phone])

## 2014-08-13 LAB — BASIC METABOLIC PANEL
ANION GAP: 10 (ref 5–15)
BUN: 38 mg/dL — AB (ref 6–20)
CHLORIDE: 106 mmol/L (ref 101–111)
CO2: 24 mmol/L (ref 22–32)
CREATININE: 2.73 mg/dL — AB (ref 0.61–1.24)
Calcium: 8.5 mg/dL — ABNORMAL LOW (ref 8.9–10.3)
GFR calc Af Amer: 25 mL/min — ABNORMAL LOW (ref >60)
GFR calc non Af Amer: 21 mL/min — ABNORMAL LOW (ref >60)
GLUCOSE: 140 mg/dL — AB (ref 65–99)
Potassium: 3.7 mmol/L (ref 3.5–5.1)
Sodium: 140 mmol/L (ref 135–145)

## 2014-08-13 LAB — URINE CULTURE: Culture: NO GROWTH

## 2014-08-13 LAB — CBC
HEMATOCRIT: 27.6 % — AB (ref 40.0–52.0)
Hemoglobin: 8.9 g/dL — ABNORMAL LOW (ref 13.0–18.0)
MCH: 27.9 pg (ref 26.0–34.0)
MCHC: 32.2 g/dL (ref 32.0–36.0)
MCV: 86.7 fL (ref 80.0–100.0)
Platelets: 269 10*3/uL (ref 150–440)
RBC: 3.19 MIL/uL — ABNORMAL LOW (ref 4.40–5.90)
RDW: 16.6 % — AB (ref 11.5–14.5)
WBC: 11.9 10*3/uL — ABNORMAL HIGH (ref 3.8–10.6)

## 2014-08-13 LAB — GLUCOSE, CAPILLARY
GLUCOSE-CAPILLARY: 131 mg/dL — AB (ref 70–99)
GLUCOSE-CAPILLARY: 135 mg/dL — AB (ref 70–99)
GLUCOSE-CAPILLARY: 137 mg/dL — AB (ref 70–99)

## 2014-08-13 MED ORDER — MAGIC MOUTHWASH
5.0000 mL | Freq: Four times a day (QID) | ORAL | Status: DC
  Administered 2014-08-13: 5 mL via ORAL
  Filled 2014-08-13 (×3): qty 10
  Filled 2014-08-13 (×2): qty 5
  Filled 2014-08-13: qty 10
  Filled 2014-08-13: qty 5
  Filled 2014-08-13 (×4): qty 10

## 2014-08-13 MED ORDER — ACETAMINOPHEN 650 MG RE SUPP: 650.0000 mg | Freq: Four times a day (QID) | RECTAL | Status: AC | PRN

## 2014-08-13 MED ORDER — LORAZEPAM 1 MG PO TABS: 1.0000 mg | ORAL_TABLET | ORAL | Status: DC | PRN

## 2014-08-13 MED ORDER — MENTHOL 3 MG MT LOZG
1.0000 | LOZENGE | OROMUCOSAL | Status: DC | PRN
  Filled 2014-08-13: qty 9

## 2014-08-13 MED ORDER — METHYLPREDNISOLONE SODIUM SUCC 125 MG IJ SOLR
125.0000 mg | Freq: Once | INTRAMUSCULAR | Status: DC
  Filled 2014-08-13: qty 2

## 2014-08-13 MED ORDER — SODIUM CHLORIDE 0.9 % IV SOLN
1000.0000 mg | Freq: Every day | INTRAVENOUS | Status: DC
  Filled 2014-08-13 (×2): qty 8

## 2014-08-13 MED ORDER — AMIODARONE HCL 200 MG PO TABS
ORAL_TABLET | ORAL | Status: AC
  Filled 2014-08-13: qty 2

## 2014-08-13 MED ORDER — AMIODARONE HCL 200 MG PO TABS: 400.0000 mg | ORAL_TABLET | Freq: Every day | ORAL | Status: DC

## 2014-08-13 MED ORDER — MORPHINE SULFATE 2 MG/ML IJ SOLN
2.0000 mg | Freq: Once | INTRAMUSCULAR | Status: DC
  Administered 2014-08-13: 2 mg via INTRAVENOUS
  Filled 2014-08-13: qty 1

## 2014-08-13 MED ORDER — SODIUM CHLORIDE 0.9 % IV SOLN
500.0000 mg | INTRAVENOUS | Status: DC
  Administered 2014-08-13: 0.5 g via INTRAVENOUS
  Filled 2014-08-13 (×2): qty 0.5

## 2014-08-13 MED ORDER — MORPHINE SULFATE 20 MG/5ML PO SOLN
5.0000 mg | ORAL | Status: AC | PRN
Start: 2014-08-13 — End: ?

## 2014-08-13 NOTE — Progress Notes (Signed)
[facility]  ROUNDING NOTE   Subjective:   Wife at bedside. Patient continues to be confused. Cultures pending.  UOP oliguric.  Complains of right mouth pain  Objective:  Vital signs in last 24 hours:  Temp:  [98.7 F (37.1 C)-101.6 F (38.7 C)] 99.3 F (37.4 C) (05/06 0528) Pulse Rate:  [85-94] 87 (05/06 0528) Resp:  [20-21] 20 (05/06 0528) BP: (99-148)/(61-66) 148/66 mmHg (05/06 0528) SpO2:  [94 %-98 %] 94 % (05/06 1001) Weight:  [95.074 kg (209 lb 9.6 oz)-95.709 kg (211 lb)] 95.074 kg (209 lb 9.6 oz) (05/06 0528)  Weight change: -3.357 kg (-7 lb 6.4 oz) Filed Weights   08/11/14 2008 08/12/14 0040 08/13/14 0528  Weight: 98.431 kg (217 lb) 95.89 kg (211 lb 6.4 oz) 95.074 kg (209 lb 9.6 oz)    Intake/Output: I/O last 3 completed shifts: In: 557.1 [I.V.:357.1; IV Piggyback:200] Out: 725 [Urine:725]   Intake/Output this shift:     Physical Exam: General: NAD, confused   Head: Normocephalic, atraumatic. Moist oral mucosal membranes, no lesions  Eyes: Anicteric, PERRL  Neck: Supple, trachea midline  Lungs:  Clear to auscultation  Heart: Regular rate and rhythm  Abdomen:  Soft, nontender, +obese  Extremities: 1+ peripheral edema.  Neurologic: Nonfocal, moving all four extremities  Skin: No lesions       Basic Metabolic Panel:  Recent Labs Lab 08/07/14 0318  08/08/14 0409 08/11/14 2015 08/12/14 0303 08/12/14 0903 08/13/14 0500  NA 138  --  139 139  --  138 140  K 4.3  --  4.0 4.1  --  3.8 3.7  CL 111  --  111 104  --  106 106  CO2 21*  --  21* 22  --  22 24  GLUCOSE 116*  --  117* 121*  --  136* 140*  BUN 29*  --  26* 36*  --  34* 38*  CREATININE 1.93*  --  1.85* 2.76* 2.54* 2.43* 2.73*  CALCIUM 8.5*  < > 8.6* 9.0  --  8.4* 8.5*  MG  --   --   --   --  1.7  --   --   < > = values in this interval not displayed.  Liver Function Tests: No results for input(s): AST, ALT, ALKPHOS, BILITOT, PROT, ALBUMIN in the last 168 hours. No results for  input(s): LIPASE, AMYLASE in the last 168 hours. No results for input(s): AMMONIA in the last 168 hours.  CBC:  Recent Labs Lab 08/08/14 0409 08/11/14 2015 08/12/14 0303 08/12/14 0903 08/13/14 0500  WBC  --  7.2 8.8 11.2* 11.9*  HGB  --  9.7* 9.2* 9.3* 8.9*  HCT  --  29.0* 27.9* 28.7* 27.6*  MCV  --  88.9 88.4 86.1 86.7  PLT 256 310 292 305 269    Cardiac Enzymes:  Recent Labs Lab 08/11/14 2015 08/12/14 0303 08/12/14 0903  TROPONINI 0.03 0.04* 0.04*    BNP: Invalid input(s): POCBNP  CBG:  Recent Labs Lab 08/12/14 1105 08/12/14 1600 08/12/14 2144 08/13/14 0036 08/13/14 0719  GLUCAP 152* 156* 121* 131* 135*    Microbiology: Results for orders placed or performed during the hospital encounter of 08/11/14  Urine culture     Status: None (Preliminary result)   Collection Time: 08/11/14  8:12 PM  Result Value Ref Range Status   Specimen Description URINE, CLEAN CATCH  Final   Special Requests NONE  Final   Culture NO GROWTH < 12 HOURS  Final  Report Status PENDING  Incomplete  Blood culture (routine x 2)     Status: None (Preliminary result)   Collection Time: 08/11/14  8:15 PM  Result Value Ref Range Status   Specimen Description BLOOD  Final   Special Requests NONE  Final   Culture NO GROWTH 1 DAY  Final   Report Status PENDING  Incomplete  Blood culture (routine x 2)     Status: None (Preliminary result)   Collection Time: 08/11/14  8:16 PM  Result Value Ref Range Status   Specimen Description BLOOD  Final   Special Requests NONE  Final   Culture NO GROWTH 2 DAYS  Final   Report Status PENDING  Incomplete    Coagulation Studies: No results for input(s): LABPROT, INR in the last 72 hours.  Urinalysis:  Recent Labs  08/11/14 2011  COLORURINE YELLOW*  LABSPEC 1.011  PHURINE 5.0  GLUCOSEU 150*  HGBUR 2+*  BILIRUBINUR NEGATIVE  KETONESUR NEGATIVE  PROTEINUR >500*  NITRITE NEGATIVE  LEUKOCYTESUR NEGATIVE      Imaging: Mr Shoulder  Right Wo Contrast  08/12/2014   CLINICAL DATA:  Chronic right shoulder pain with limited range of motion.  EXAM: MRI OF THE RIGHT SHOULDER WITHOUT CONTRAST  TECHNIQUE: Multiplanar, multisequence MR imaging of the shoulder was performed. No intravenous contrast was administered.  COMPARISON:  CT of the chest from 2010  FINDINGS: Examination is very limited due to patient motion.  Rotator cuff: Significant rotator cuff tendinopathy/ tendinosis but no obvious large full-thickness retracted tear.  Muscles: Mild edema like signal abnormality in the shoulder musculature may reflect muscle strains, partial tears or more likely diffuse nonspecific myositis.  Biceps long head:  Grossly intact.  Acromioclavicular Joint: Moderate degenerative changes. The acromion is type 2-3 in shape. Mild lateral downsloping.  Glenohumeral Joint: Severe glenohumeral joint degenerative changes with joint space narrowing, osteophytic spurring and subchondral cystic change. The glenoid is markedly widened. There is a small joint effusion and moderate synovitis.  Labrum:  Difficult to assess but likely degenerated and torn.  Bones:  No acute findings.  Other: Moderate subacromial/ subdeltoid bursitis.  IMPRESSION: Very limited examination due to patient motion.  Significant rotator cuff tendinopathy/ tendinosis but no obvious large full-thickness retracted tear.  Diffuse muscle edema likely nonspecific myositis.  Severe glenohumeral joint degenerative changes.   Electronically Signed   By: Marijo Sanes M.D.   On: 08/12/2014 13:18   US Venous Img Upper Uni Right  08/11/2014   CLINICAL DATA:  Right upper extremity pain.  Recent PICC line.  EXAM: Right UPPER EXTREMITY VENOUS DOPPLER ULTRASOUND  TECHNIQUE: Gray-scale sonography with graded compression, as well as color Doppler and duplex ultrasound were performed to evaluate the upper extremity deep venous system from the level of the subclavian vein and including the jugular, axillary, basilic,  radial, ulnar and upper cephalic vein. Spectral Doppler was utilized to evaluate flow at rest and with distal augmentation maneuvers.  COMPARISON:  Chest radiograph 08/11/2014  FINDINGS: Contralateral Subclavian Vein: Respiratory phasicity is normal and symmetric with the symptomatic side. No evidence of thrombus. Normal compressibility.  Internal Jugular Vein: No evidence of thrombus. Normal compressibility, respiratory phasicity and response to augmentation.  Subclavian Vein: No evidence of thrombus. Normal compressibility, respiratory phasicity and response to augmentation.  Axillary Vein: No evidence of thrombus. Normal compressibility, respiratory phasicity and response to augmentation.  Cephalic Vein: No evidence of thrombus. Normal compressibility, respiratory phasicity and response to augmentation.  Basilic Vein: No evidence of thrombus. Normal  compressibility, respiratory phasicity and response to augmentation.  Brachial Veins: No evidence of thrombus. Normal compressibility, respiratory phasicity and response to augmentation.  Radial Veins: No evidence of thrombus. Normal compressibility, respiratory phasicity and response to augmentation.  Ulnar Veins: No evidence of thrombus. Normal compressibility, respiratory phasicity and response to augmentation.  Other Findings: There is a PICC line in the proximal basilic vein extending into the axillary and subclavian vein.  IMPRESSION: No evidence of deep venous thrombosis.   Electronically Signed   By: Suzy Bouchard M.D.   On: 08/11/2014 21:53   Dg Chest Port 1 View  08/11/2014   CLINICAL DATA:  Weakness and right-sided chest pain  EXAM: PORTABLE CHEST - 1 VIEW  COMPARISON:  08/07/2014  FINDINGS: Cardiac shadow is stable. A right-sided PICC line is again noted. The lungs are well aerated with mild right basilar atelectasis. No focal confluent infiltrate is seen. Degenerative changes of the shoulder joints are noted.  IMPRESSION: Mild right basilar  atelectasis.  Stable PICC line.   Electronically Signed   By: Inez Catalina M.D.   On: 08/11/2014 20:32     Medications:   . sodium chloride 50 mL/hr at 08/13/14 0029  . amiodarone 30 mg/hr (08/13/14 0241)   . acidophilus  2 capsule Oral Daily  . allopurinol  100 mg Oral Daily  . amLODipine  5 mg Oral BID  . aspirin EC  81 mg Oral Daily  . calcium-vitamin D   Oral Daily  . carvedilol  3.125 mg Oral BID WC  . clopidogrel  75 mg Oral Q breakfast  . docusate sodium  100 mg Oral QHS  . ertapenem  500 mg Intravenous Q24H  . ferrous sulfate  325 mg Oral q morning - 10a  . heparin  5,000 Units Subcutaneous 3 times per day  . insulin aspart  0-5 Units Subcutaneous QHS  . insulin aspart  0-9 Units Subcutaneous TID WC  . multivitamin  1 tablet Oral Daily  . pregabalin  75 mg Oral Daily  . sodium chloride  3 mL Intravenous Q12H  . vancomycin  1,000 mg Intravenous Q24H   acetaminophen **OR** acetaminophen, HYDROcodone-acetaminophen, morphine injection, ondansetron **OR** ondansetron (ZOFRAN) IV  Assessment/ Plan:  Mr. Sargent is a 74 y.o. white male with diabetes mellitus type II, diabetic neuropathy, hypertension, CVA, hyperlipidemia, congestive heart failure, coronary artery disease, gout, depression, bowel resection and cholecystectomy.  Admitted on 08/11/2014  1. Acute renal failure on chronic kidney disease stage III: acute renal failure seems secondary to prerenal azotemia secondary to poor PO intake due to underlying urinary tract infection.  Baseline creatinine of 2.1, eGFR of 30. Chronic kidney disease is secondary to diabetic nephropathy.  - Agree with gentle IV fluids. Continue to monitor volume status, urine output and serum electrolytes. - Renal ultrasound reviewed from 2/16. Consistent with CKD.  - No acute indication for dialysis. Family does not seem interested in long term dailysis.   2. Urinary Tract Infection: empiric antibiotics: ertapenem and vanocmycin - cultures  pending.  - Appreciate ID  3. Ventricular tachycardia: now on amiodarone gtt. Appreciate cards input.   4. Hypertension: elevated, but now better controlled.  Currently on amlodipine, carvedilol  5. Diabetes Mellitus type II with renal manifestions: continue glucose control.   6. Anemia of chronic kidney disease: hemoglobin of 8.9 - continue to monitor, no acute indication for epo.    LOS: 2 Kennth [place] 5/6/201610:27 AM

## 2014-08-13 NOTE — Progress Notes (Signed)
   08/13/14 1300  Clinical Encounter Type  Visited With Patient and family together  Visit Type Spiritual support;Psychological support  Referral From Nurse;Physician  Spiritual Encounters  Spiritual Needs Emotional  Stress Factors  Patient Stress Factors Health changes  Family Stress Factors Major life changes  Was paged/requested by palliative care staff to speak to patient and family regarding decision to move patient to hospice home.  Patient said he accepted it, but spouse was having a difficult time and cried a bit while I was present.  Provided emotional and pastoral support to patient and family.  Spouse said she was grateful for my visit.  [place] [phone]

## 2014-08-13 NOTE — Progress Notes (Signed)
Pt discharged to [facility], PIVs pulled, PICC left in per Hospice laiason, tele monitor turned in, pt will transport via EMS.

## 2014-08-13 NOTE — Discharge Summary (Signed)
[facility] NAME: Darrell Richardson    MR#:  [id number]  DATE OF BIRTH:  December 28, 1940  DATE OF ADMISSION:  08/11/2014 ADMITTING PHYSICIAN: Lytle Butte, MD  DATE OF DISCHARGE: No discharge date for patient encounter.  PRIMARY CARE PHYSICIAN: Chrismon, DENNIS E, PA    ADMISSION DIAGNOSIS:  Ventricular tachycardia [I47.2] Acute on chronic renal failure [N17.9, N18.9] Pain of right upper extremity [M79.601]  DISCHARGE DIAGNOSIS:  Principal Problem:   NSVT (nonsustained ventricular tachycardia) Active Problems:   Hypertension   CKD (chronic kidney disease), stage IV   Diabetes type 2, uncontrolled   Chronic combined systolic and diastolic heart failure   CAD in native artery   Hyperlipidemia   Diabetes mellitus, type 2   AKI (acute kidney injury)  progressive myositis Sepsis  SECONDARY DIAGNOSIS:   Past Medical History  Diagnosis Date  . Hearing loss   . Gout   . Diabetes   . Fatigue   . Hypertension   . Stroke     X 2  . Heart failure   . Muscle pain   . Hammertoe   . Myocardial infarction 1970s  . Chronic combined systolic and diastolic congestive heart failure   . Shortness of breath dyspnea   . Depression   . Anxiety   . CKD (chronic kidney disease), stage IV   . Headache   . Arthritis   . Anemia     HOSPITAL COURSE:   74 year old Caucasian gentleman with history of combined systolic and diastolic heart failure ejection fraction 40%, CKD stage 3-4, baseline cr of 2.1, CAD, CVA, recent hospitalisation for ESBL E.coli discharged on invanz and a PICC line comes with weakness, fevers and right arm pain. In the emergency department noted to have a run of nonsustained ventricular tachycardia.  * Sepsis- with fevers, tachycardia Blood cultures negative. Was on invanz and vancomycin here for recent ESBL E.coli but has ongoing fevers UA this time is not impressive ID consulted Fever could be from  inflammation vs infection  *. Nonsustained ventricular tachycardia: On amiodarone and coreg, cards consulted Known cardiomyopathy EF 40%  * Acute kidney injury on chronic kidney disease: baseline cr 2.1, continued to get worse Likely ATN from sepsis and prerenal on adm Gentle hydration Consulted nephrology, held diuretics and further nephrotoxic agents Family not interested in dialysis  * Myositis- significant muscle pain/tenderness, now spreading Discussed with wife about further work up for it vs symptomatic trt- family did not want further aggressive care and so given a dose of IV steroid MRI showing muscle inflamm of right arm. recent PICC- but u/s negative for any clot. Cont lyrica for now. Also pain meds  *. Hypertension, essential: on norvasc and coreg  *. Venous thromboembolism prophylactic: Heparin subcutaneous  Patient is DNR. Palliative care consult ordered Overall Poor prognosis Pt is comfort care and is being discharged to Hospice home for symptom management  DISCHARGE CONDITIONS:   Guarded  CONSULTS OBTAINED:  Treatment Team:  Rise Mu, PA-C Minna Merritts, MD Adrian Prows, MD [place], MD  DRUG ALLERGIES:   Allergies  Allergen Reactions  . Cortisone Anaphylaxis and Other (See Comments)    Reaction:  Cold, elevated BP, shaking all over, and fever Pts wife states that he tolerates hydrocortisone cream at home.    . Ambien [Zolpidem Tartrate] Other (See Comments)    Reaction:  Pt did not "tolerate well" or remember anything from the previous night.    Marland Kitchen  Rocephin [Ceftriaxone] Hives  . Levaquin [Levofloxacin In D5w] Rash    DISCHARGE MEDICATIONS:   Current Discharge Medication List    START taking these medications   Details  acetaminophen (TYLENOL) 650 MG suppository Place 1 suppository (650 mg total) rectally every 6 (six) hours as needed for mild pain (or Fever >/= 101). Qty: 12 suppository, Refills: 0    LORazepam (ATIVAN) 1 MG  tablet Take 1 tablet (1 mg total) by mouth every hour as needed for anxiety. Qty: 30 tablet, Refills: 0    morphine 20 MG/5ML solution Take 1.3 mLs (5.2 mg total) by mouth every hour as needed for pain. Qty: 30 mL, Refills: 0      CONTINUE these medications which have NOT CHANGED   Details  allopurinol (ZYLOPRIM) 100 MG tablet Take 1 tablet (100 mg total) by mouth daily. Qty: 30 tablet, Refills: 1    docusate sodium (COLACE) 100 MG capsule Take 100 mg by mouth at bedtime.      STOP taking these medications     acetaminophen (TYLENOL) 325 MG tablet      acidophilus (RISAQUAD) CAPS capsule      amLODipine (NORVASC) 5 MG tablet      Calcium Carbonate-Vitamin D (CALCIUM-VITAMIN D) 500-200 MG-UNIT per tablet      carvedilol (COREG) 3.125 MG tablet      clopidogrel (PLAVIX) 75 MG tablet      ertapenem 1 g in sodium chloride 0.9 % 50 mL      ferrous sulfate 325 (65 FE) MG tablet      furosemide (LASIX) 80 MG tablet      HYDROcodone-acetaminophen (NORCO/VICODIN) 5-325 MG per tablet      linagliptin (TRADJENTA) 5 MG TABS tablet      Multiple Vitamin (MULTIVITAMIN) tablet      pregabalin (LYRICA) 75 MG capsule      sodium chloride 0.9 % infusion          DISCHARGE INSTRUCTIONS:    BEING TRANSFERRED TO HOSPICE HOME  If you experience worsening of your admission symptoms, develop shortness of breath, life threatening emergency, suicidal or homicidal thoughts you must seek medical attention immediately by calling 911 or calling your MD immediately  if symptoms less severe.  You Must read complete instructions/literature along with all the possible adverse reactions/side effects for all the Medicines you take and that have been prescribed to you. Take any new Medicines after you have completely understood and accept all the possible adverse reactions/side effects.   Please note  You were cared for by a hospitalist during your hospital stay. If you have any questions  about your discharge medications or the care you received while you were in the hospital after you are discharged, you can call the unit and asked to speak with the hospitalist on call if the hospitalist that took care of you is not available. Once you are discharged, your primary care physician will handle any further medical issues. Please note that NO REFILLS for any discharge medications will be authorized once you are discharged, as it is imperative that you return to your primary care physician (or establish a relationship with a primary care physician if you do not have one) for your aftercare needs so that they can reassess your need for medications and monitor your lab values.    Today   CHIEF COMPLAINT:   Chief Complaint  Patient presents with  . Weakness  . Arm Injury  . Fatigue  . Fever  VITAL SIGNS:  Blood pressure 137/67, pulse 79, temperature 99.3 F (37.4 C), temperature source Oral, resp. rate 18, height 5\' 8"  (1.727 m), weight 95.074 kg (209 lb 9.6 oz), SpO2 93 %.  I/O:   Intake/Output Summary (Last 24 hours) at 08/13/14 1523 Last data filed at 08/13/14 1200  Gross per 24 hour  Intake 3354.17 ml  Output    325 ml  Net 3029.17 ml    PHYSICAL EXAMINATION:   GENERAL: 74 y.o.-year-old patient lying in the bed with no acute distress.  EYES: Pupils equal, round, reactive to light and accommodation. No scleral icterus. Extraocular muscles intact.  HEENT: Head atraumatic, normocephalic. Oropharynx and nasopharynx clear.  NECK: Supple, no jugular venous distention. No thyroid enlargement, no tenderness.  LUNGS: Normal breath sounds bilaterally, no wheezing, rales,rhonchi or crepitation. No use of accessory muscles of respiration. Decreased bibasilar breath sounds CARDIOVASCULAR: S1, S2 normal. No murmurs, rubs, or gallops.  ABDOMEN: Soft, nontender, nondistended. Bowel sounds present. No organomegaly or mass.  EXTREMITIES:1+edal edema, cyanosis, or clubbing.   NEUROLOGIC: Cranial nerves II through XII are intact. Sensation intact. Gait not checked.  Right arm with minimal movement , left arm significant pain/tenderness to touch. PSYCHIATRIC: The patient is alert and oriented x 3 but intermittent confusion SKIN: No obvious rash, lesion, or ulcer.   DATA REVIEW:   CBC  Recent Labs Lab 08/13/14 0500  WBC 11.9*  HGB 8.9*  HCT 27.6*  PLT 269    Chemistries   Recent Labs Lab 08/12/14 0303  08/13/14 0500  NA  --   < > 140  K  --   < > 3.7  CL  --   < > 106  CO2  --   < > 24  GLUCOSE  --   < > 140*  BUN  --   < > 38*  CREATININE 2.54*  < > 2.73*  CALCIUM  --   < > 8.5*  MG 1.7  --   --   < > = values in this interval not displayed.  Cardiac Enzymes  Recent Labs Lab 08/12/14 0903  TROPONINI 0.04*    Microbiology Results  Results for orders placed or performed during the hospital encounter of 08/11/14  Urine culture     Status: None   Collection Time: 08/11/14  8:12 PM  Result Value Ref Range Status   Specimen Description URINE, CLEAN CATCH  Final   Special Requests NONE  Final   Culture NO GROWTH 1 DAY  Final   Report Status 08/13/2014 FINAL  Final  Blood culture (routine x 2)     Status: None (Preliminary result)   Collection Time: 08/11/14  8:15 PM  Result Value Ref Range Status   Specimen Description BLOOD  Final   Special Requests NONE  Final   Culture NO GROWTH 1 DAY  Final   Report Status PENDING  Incomplete  Blood culture (routine x 2)     Status: None (Preliminary result)   Collection Time: 08/11/14  8:16 PM  Result Value Ref Range Status   Specimen Description BLOOD  Final   Special Requests NONE  Final   Culture NO GROWTH 2 DAYS  Final   Report Status PENDING  Incomplete    RADIOLOGY:  Mr Shoulder Right Wo Contrast  08/12/2014   CLINICAL DATA:  Chronic right shoulder pain with limited range of motion.  EXAM: MRI OF THE RIGHT SHOULDER WITHOUT CONTRAST  TECHNIQUE: Multiplanar, multisequence MR imaging  of the shoulder was  performed. No intravenous contrast was administered.  COMPARISON:  CT of the chest from 2010  FINDINGS: Examination is very limited due to patient motion.  Rotator cuff: Significant rotator cuff tendinopathy/ tendinosis but no obvious large full-thickness retracted tear.  Muscles: Mild edema like signal abnormality in the shoulder musculature may reflect muscle strains, partial tears or more likely diffuse nonspecific myositis.  Biceps long head:  Grossly intact.  Acromioclavicular Joint: Moderate degenerative changes. The acromion is type 2-3 in shape. Mild lateral downsloping.  Glenohumeral Joint: Severe glenohumeral joint degenerative changes with joint space narrowing, osteophytic spurring and subchondral cystic change. The glenoid is markedly widened. There is a small joint effusion and moderate synovitis.  Labrum:  Difficult to assess but likely degenerated and torn.  Bones:  No acute findings.  Other: Moderate subacromial/ subdeltoid bursitis.  IMPRESSION: Very limited examination due to patient motion.  Significant rotator cuff tendinopathy/ tendinosis but no obvious large full-thickness retracted tear.  Diffuse muscle edema likely nonspecific myositis.  Severe glenohumeral joint degenerative changes.   Electronically Signed   By: Marijo Sanes M.D.   On: 08/12/2014 13:18   US Venous Img Upper Uni Right  08/11/2014   CLINICAL DATA:  Right upper extremity pain.  Recent PICC line.  EXAM: Right UPPER EXTREMITY VENOUS DOPPLER ULTRASOUND  TECHNIQUE: Gray-scale sonography with graded compression, as well as color Doppler and duplex ultrasound were performed to evaluate the upper extremity deep venous system from the level of the subclavian vein and including the jugular, axillary, basilic, radial, ulnar and upper cephalic vein. Spectral Doppler was utilized to evaluate flow at rest and with distal augmentation maneuvers.  COMPARISON:  Chest radiograph 08/11/2014  FINDINGS: Contralateral  Subclavian Vein: Respiratory phasicity is normal and symmetric with the symptomatic side. No evidence of thrombus. Normal compressibility.  Internal Jugular Vein: No evidence of thrombus. Normal compressibility, respiratory phasicity and response to augmentation.  Subclavian Vein: No evidence of thrombus. Normal compressibility, respiratory phasicity and response to augmentation.  Axillary Vein: No evidence of thrombus. Normal compressibility, respiratory phasicity and response to augmentation.  Cephalic Vein: No evidence of thrombus. Normal compressibility, respiratory phasicity and response to augmentation.  Basilic Vein: No evidence of thrombus. Normal compressibility, respiratory phasicity and response to augmentation.  Brachial Veins: No evidence of thrombus. Normal compressibility, respiratory phasicity and response to augmentation.  Radial Veins: No evidence of thrombus. Normal compressibility, respiratory phasicity and response to augmentation.  Ulnar Veins: No evidence of thrombus. Normal compressibility, respiratory phasicity and response to augmentation.  Other Findings: There is a PICC line in the proximal basilic vein extending into the axillary and subclavian vein.  IMPRESSION: No evidence of deep venous thrombosis.   Electronically Signed   By: Suzy Bouchard M.D.   On: 08/11/2014 21:53   Dg Chest Port 1 View  08/11/2014   CLINICAL DATA:  Weakness and right-sided chest pain  EXAM: PORTABLE CHEST - 1 VIEW  COMPARISON:  08/07/2014  FINDINGS: Cardiac shadow is stable. A right-sided PICC line is again noted. The lungs are well aerated with mild right basilar atelectasis. No focal confluent infiltrate is seen. Degenerative changes of the shoulder joints are noted.  IMPRESSION: Mild right basilar atelectasis.  Stable PICC line.   Electronically Signed   By: Inez Catalina M.D.   On: 08/11/2014 20:32    EKG:   Orders placed or performed during the hospital encounter of 08/11/14  . ED EKG  . ED EKG       Management plans discussed  with the patient, family and they are in agreement.  CODE STATUS:     Code Status Orders        Start     Ordered   08/11/14 2329  Do not attempt resuscitation (DNR)   Continuous    Question Answer Comment  In the event of cardiac or respiratory ARREST Do not call a "code blue"   In the event of cardiac or respiratory ARREST Do not perform Intubation, CPR, defibrillation or ACLS   In the event of cardiac or respiratory ARREST Use medication by any route, position, wound care, and other measures to relive pain and suffering. May use oxygen, suction and manual treatment of airway obstruction as needed for comfort.      08/11/14 2330    Advance Directive Documentation        Most Recent Value   Type of Advance Directive  Healthcare Power of Attorney, Living will   Pre-existing out of facility DNR order (yellow form or pink MOST form)     "MOST" Form in Place?        TOTAL TIME TAKING CARE OF THIS PATIENT: 38 minutes.    Roark Rufo M.D on 08/13/2014 at 3:23 PM  Between 7am to 6pm - Pager - [phone]  After 6pm go to [email or web address] - password [email or web address] [facility]  Office  [phone]  CC: Primary care physician; Chrismon, DENNIS E, PA

## 2014-08-13 NOTE — Consult Note (Signed)
Hospice Home Discharge Orders   Patient:  Darrell Richardson is an 74 y.o., male MRN:  [id number] DOB:  [birth year] Patient phone:  [phone] (home)  Patient address:   [place] [address],     Admit: Admit to Hospice Home  Code Status: DNR  Diet: as tolerated  Activity: as tolerated  Oxygen: use for comfort  Foley: Leave foley cath for urinary incontinence/previous skin breakdown   Date of Admission:  08/11/2014   Principal Problem: NSVT (nonsustained ventricular tachycardia) Discharge Diagnoses: Patient Active Problem List   Diagnosis Date Noted  . NSVT (nonsustained ventricular tachycardia) [I47.2] 08/11/2014  . AKI (acute kidney injury) [N17.9] 08/11/2014  . CAFL (chronic airflow limitation) [J44.9] 08/04/2014  . Arteriosclerosis of coronary artery [I25.10] 08/04/2014  . H/O: gout [Z86.39] 08/04/2014  . Adiposity [E66.9] 08/04/2014  . Abnormal presence of protein in urine [R80.9] 08/04/2014  . History of colonic polyps [Z86.010] 08/03/2014  . Hyperlipidemia [E78.5] 07/30/2014  . CAD in native artery [I25.10]   . Chronic combined systolic and diastolic heart failure [I94.85] 06/12/2014  . ESR raised [R70.0] 06/12/2014  . Elevated PSA [R97.2] 06/12/2014  . Debilitated [R53.81] 06/11/2014  . Palliative care encounter [Z51.5] 06/09/2014  . Myalgia [M79.1]   . Polyarthralgia [M25.50]   . 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 Diabetes type 2, uncontrolled [E11.65]   . History of CVA (cerebrovascular accident) [Z86.73]   . CKD (chronic kidney disease), stage IV [N18.4] 05/31/2014  . Hypertension [I10]   . Benign neoplasm of skin [D23.9] 02/02/2014  . Stroke [I63.9] 02/02/2014  . Diabetes mellitus, type 2 [E11.9] 02/02/2014  . Gout [M10.9] 02/02/2014  . Abdominal pain, lower [R10.30] 02/02/2014  . Avitaminosis D [E55.9] 02/02/2014      Medications: May crush or use liquid when appropriate. May change to rectal route  if unable to swallow.    Medication List    STOP taking these medications        acetaminophen 325 MG tablet  Commonly known as:  TYLENOL  Replaced by:  acetaminophen 650 MG suppository     acidophilus Caps capsule     amLODipine 5 MG tablet  Commonly known as:  NORVASC     calcium-vitamin D 500-200 MG-UNIT per tablet     carvedilol 3.125 MG tablet  Commonly known as:  COREG     clopidogrel 75 MG tablet  Commonly known as:  PLAVIX     ertapenem 1 g in sodium chloride 0.9 % 50 mL     ferrous sulfate 325 (65 FE) MG tablet     furosemide 80 MG tablet  Commonly known as:  LASIX     HYDROcodone-acetaminophen 5-325 MG per tablet  Commonly known as:  NORCO/VICODIN     linagliptin 5 MG Tabs tablet  Commonly known as:  TRADJENTA     multivitamin tablet     pregabalin 75 MG capsule  Commonly known as:  LYRICA     sodium chloride 0.9 % infusion      TAKE these medications        acetaminophen 650 MG suppository  Commonly known as:  TYLENOL  Place 1 suppository (650 mg total) rectally every 6 (six) hours as needed for mild pain (or Fever >/= 101).     allopurinol 100 MG tablet  Commonly known as:  ZYLOPRIM  Take 1 tablet (100 mg total) by mouth daily.     docusate sodium 100 MG capsule  Commonly known as:  COLACE  Take 100 mg by mouth at bedtime.     LORazepam 1 MG tablet  Commonly known as:  ATIVAN  Take 1 tablet (1 mg total) by mouth every hour as needed for anxiety.     morphine 20 MG/5ML solution  Take 1.3 mLs (5.2 mg total) by mouth every hour as needed for pain.           Comments:    SignedGrayland Jack Coree Brame 08/13/2014, 1:15 PM

## 2014-08-13 NOTE — Consult Note (Signed)
At wife's request, I spoke with pt. We discussed transfer to the [facility]. Pt wants to return to his home but realizes that his wife cannot care for him. He is agreeable to going to the Hospice Home. Orders entered. Updated wife on my discussion with pt.

## 2014-08-13 NOTE — Care Management Note (Signed)
Case Management Note  Patient Details  Name: Darrell Richardson MRN: [id number] Date of Birth: 1941-02-02  Subjective/Objective:                    Action/Plan:   Expected Discharge Date:                  Expected Discharge Plan:     In-House Referral:     Discharge planning Services     Post Acute Care Choice:    Choice offered to:     DME Arranged:    DME Agency:     HH Arranged:    [place] Agency:     Status of Service:     Medicare Important Message Given:   yes Date Medicare IM Given:   08/13/14 Medicare IM give by:   Joni Reining Date Additional Medicare IM Given:    Additional Medicare Important Message give by:     If discussed at [place] of Stay Meetings, dates discussed:    Additional Comments:  Katrina Stack, RN 08/13/2014, 8:57 AM

## 2014-08-13 NOTE — Progress Notes (Signed)
Follow up visit made on new referral received 08/12/14  for services of [facility] after discharge. Patient's wife requested that writer speak with her outside of the patient room. She reports that patient heard the word Hospice earlier and became upset. Mrs. Hattabaugh reports that patient is now having difficulty swallowing, needing his pills crushed this morning and is unable to suck with a straw, he did not eat breakfast this morning or dinner last evening. She also reports that he is now having mouth pain. Per chart review there is a new order for [facility] mouth wash, also patient has received 4 mg of IV morphine for pain since 5 am today. He continues to be febrile, WBC elevated at 11.9, blood cultures are pending. Mrs. Grell remains hopeful that her husband will show some improvement with current IV antibiotics, she does also remain realistic about his prognosis. Emotional support given. Mrs. Ponder aware that Probation officer will follow up on Monday. Thank you. Flo Shanks RN, BSN, [place] and [facility] Liaison [phone]

## 2014-08-13 NOTE — Progress Notes (Signed)
ANTIBIOTIC CONSULT NOTE - INITIAL  Pharmacy Consult for Vancomycin Indication: rule out sepsis  Allergies  Allergen Reactions  . Cortisone Anaphylaxis and Other (See Comments)    Reaction:  Cold, elevated BP, shaking all over, and fever Pts wife states that he tolerates hydrocortisone cream at home.    . Ambien [Zolpidem Tartrate] Other (See Comments)    Reaction:  Pt did not "tolerate well" or remember anything from the previous night.    . Rocephin [Ceftriaxone] Hives  . Levaquin [Levofloxacin In D5w] Rash    Patient Measurements: Height: 5\' 8"  (172.7 cm) Weight: 209 lb 9.6 oz (95.074 kg) IBW/kg (Calculated) : 68.4 Adjusted Body Weight: 79.4 kg  Vital Signs: Temp: 99.3 F (37.4 C) (05/06 0528) Temp Source: Oral (05/06 0528) BP: 148/66 mmHg (05/06 0528) Pulse Rate: 87 (05/06 0528)   Intake/Output from previous day: 05/05 0701 - 05/06 0700 In: 200 [IV Piggyback:200] Out: 325 [Urine:325]   Intake/Output from this shift:    Labs:  Recent Labs  08/12/14 0303 08/12/14 0903 08/13/14 0500  WBC 8.8 11.2* 11.9*  HGB 9.2* 9.3* 8.9*  PLT 292 305 269  CREATININE 2.54* 2.43* 2.73*   Estimated Creatinine Clearance: 26.6 mL/min (by C-G formula based on Cr of 2.73).  Microbiology: Recent Results (from the past 720 hour(s))  Culture, blood (single)     Status: None   Collection Time: 08/01/14  4:22 PM  Result Value Ref Range Status   Micro Text Report   Final       COMMENT                   NO GROWTH AEROBICALLY/ANAEROBICALLY IN 5 DAYS   ANTIBIOTIC                                                      Urine culture     Status: None   Collection Time: 08/01/14  5:12 PM  Result Value Ref Range Status   Micro Text Report   Final       SOURCE: CLEAN CATCH    ORGANISM 1                >100,000 CFU/ML Escherichia coli   COMMENT                   ESBL (Extended Spectrum Beta Lactamase)   COMMENT                   -   ANTIBIOTIC                    ORG#1      AMPICILLIN                    R         CEFAZOLIN                     R         CEFOXITIN                     S         CEFTRIAXONE                   R         CIPROFLOXACIN  R         ERTAPENEM                     S         GENTAMICIN                    S         IMIPENEM                      S         LEVOFLOXACIN                  R         NITROFURANTOIN                S         ESBL                          POSITIVE  Trimethoprim/Sulfamethoxazole R           Culture, blood (single)     Status: None   Collection Time: 08/01/14  6:37 PM  Result Value Ref Range Status   Micro Text Report   Final       COMMENT                   NO GROWTH AEROBICALLY/ANAEROBICALLY IN 5 DAYS   ANTIBIOTIC                                                      Urine culture     Status: None (Preliminary result)   Collection Time: 08/11/14  8:12 PM  Result Value Ref Range Status   Specimen Description URINE, CLEAN CATCH  Final   Special Requests NONE  Final   Culture NO GROWTH < 12 HOURS  Final   Report Status PENDING  Incomplete  Blood culture (routine x 2)     Status: None (Preliminary result)   Collection Time: 08/11/14  8:15 PM  Result Value Ref Range Status   Specimen Description BLOOD  Final   Special Requests NONE  Final   Culture NO GROWTH < 12 HOURS  Final   Report Status PENDING  Incomplete  Blood culture (routine x 2)     Status: None (Preliminary result)   Collection Time: 08/11/14  8:16 PM  Result Value Ref Range Status   Specimen Description BLOOD  Final   Special Requests NONE  Final   Culture NO GROWTH < 12 HOURS  Final   Report Status PENDING  Incomplete    Medical History: Past Medical History  Diagnosis Date  . Hearing loss   . Gout   . Diabetes   . Fatigue   . Hypertension   . Stroke     X 2  . Heart failure   . Muscle pain   . Hammertoe   . Myocardial infarction 1970s  . Chronic combined systolic and diastolic congestive heart failure   .  Shortness of breath dyspnea   . Depression   . Anxiety   . CKD (chronic kidney disease), stage IV   . Headache   . Arthritis   . Anemia     Medications:  Scheduled:  .  acidophilus  2 capsule Oral Daily  . allopurinol  100 mg Oral Daily  . amLODipine  5 mg Oral BID  . aspirin EC  81 mg Oral Daily  . calcium-vitamin D   Oral Daily  . carvedilol  3.125 mg Oral BID WC  . clopidogrel  75 mg Oral Q breakfast  . docusate sodium  100 mg Oral QHS  . ertapenem  1 g Intravenous Q24H  . ferrous sulfate  325 mg Oral q morning - 10a  . heparin  5,000 Units Subcutaneous 3 times per day  . insulin aspart  0-5 Units Subcutaneous QHS  . insulin aspart  0-9 Units Subcutaneous TID WC  . multivitamin  1 tablet Oral Daily  . pregabalin  75 mg Oral Daily  . sodium chloride  3 mL Intravenous Q12H  . vancomycin  1,000 mg Intravenous Q24H   Assessment:  Patient is a 74 yo male admitted with sustained v tach and started on amiodarone drip.  Patient previously admitted from 4/24-5/1 for treatment of ESBL UTI.  Discharged on Ertapenem 1 gm IV q24h and continued inhouse.  MD with new consult to start Vancomycin for possible sepsis/UTI.   SCr increased today to 2.73.   SCr: 2.73. Est CrCl~26.6 mL/min based on adjusted body weight, t1/2: 26.6 h, Vd: 55.4 L  Kinetic values yield a trough of 19.    Per ID, continue Vancomycin and Ertapenem until culture data available.   Goal of Therapy:  Vancomycin trough level 15-20 mcg/ml  Plan:  Will continue Vancomycin 1 gm IV q24h.  Will need to closely follow SCr for potential changes in dosing.  Will check a trough on day 4 of therapy (not at steady state).  Trough ordered for 5/8 at 1600.  Will check SCr in AM for monitoring.  Pharmacy will continue to follow and adjust dosing as warranted.   Murrell Converse, PharmD Clinical Pharmacist 08/13/2014

## 2014-08-13 NOTE — Progress Notes (Signed)
Patient: Darrell Richardson / Admit Date: 08/11/2014 / Date of Encounter: 08/13/2014, 12:11 PM   Subjective: Complains of mouth pain. Difficulty swallowing. Requiring pills to be crushed this morning. No SOB, chest pain, or palpitations. Echo is pending.   Review of Systems: Review of Systems  Constitutional: Positive for fever and malaise/fatigue. Negative for chills and diaphoresis.  HENT: Negative for congestion and sore throat.        Sore mouth  Respiratory: Negative for cough, hemoptysis, sputum production, shortness of breath, wheezing and stridor.   Cardiovascular: Negative for chest pain, palpitations, orthopnea, claudication, leg swelling and PND.  Neurological: Positive for weakness.    Objective: Telemetry: NSR, 70s Physical Exam: Blood pressure 137/67, pulse 79, temperature 99.3 F (37.4 C), temperature source Oral, resp. rate 18, height 5\' 8"  (1.727 m), weight 209 lb 9.6 oz (95.074 kg), SpO2 93 %. Body mass index is 31.88 kg/(m^2). General: Well developed, well nourished, in no acute distress. Head: Normocephalic, atraumatic, sclera non-icteric, no xanthomas, nares are without discharge. Neck: Negative for carotid bruits. JVP not elevated. Lungs: Clear bilaterally to auscultation without wheezes, rales, or rhonchi. Breathing is unlabored. Heart: RRR S1 S2 without murmurs, rubs, or gallops.  Abdomen: Soft, non-tender, non-distended with normoactive bowel sounds. No rebound/guarding. Extremities: No clubbing or cyanosis. No edema. Distal pedal pulses are 2+ and equal bilaterally. Neuro: Alert and oriented X 3. Moves all extremities spontaneously. Psych:  Responds to questions appropriately with a normal affect.   Intake/Output Summary (Last 24 hours) at 08/13/14 1211 Last data filed at 08/13/14 0830  Gross per 24 hour  Intake    200 ml  Output    325 ml  Net   -125 ml    Inpatient Medications:  . acidophilus  2 capsule Oral Daily  . allopurinol  100 mg Oral Daily    . amLODipine  5 mg Oral BID  . aspirin EC  81 mg Oral Daily  . calcium-vitamin D   Oral Daily  . carvedilol  3.125 mg Oral BID WC  . clopidogrel  75 mg Oral Q breakfast  . docusate sodium  100 mg Oral QHS  . ertapenem  500 mg Intravenous Q24H  . ferrous sulfate  325 mg Oral q morning - 10a  . heparin  5,000 Units Subcutaneous 3 times per day  . insulin aspart  0-5 Units Subcutaneous QHS  . insulin aspart  0-9 Units Subcutaneous TID WC  . magic mouthwash  5 mL Oral QID  . methylPREDNISolone (SOLU-MEDROL) injection  1,000 mg Intravenous Daily  . multivitamin  1 tablet Oral Daily  . pregabalin  75 mg Oral Daily  . sodium chloride  3 mL Intravenous Q12H  . vancomycin  1,000 mg Intravenous Q24H   Infusions:  . sodium chloride 50 mL/hr at 08/13/14 0029  . amiodarone 30 mg/hr (08/13/14 0241)    Labs:  Recent Labs  08/12/14 0303 08/12/14 0903 08/13/14 0500  NA  --  138 140  K  --  3.8 3.7  CL  --  106 106  CO2  --  22 24  GLUCOSE  --  136* 140*  BUN  --  34* 38*  CREATININE 2.54* 2.43* 2.73*  CALCIUM  --  8.4* 8.5*  MG 1.7  --   --    No results for input(s): AST, ALT, ALKPHOS, BILITOT, PROT, ALBUMIN in the last 72 hours.  Recent Labs  08/12/14 0903 08/13/14 0500  WBC 11.2* 11.9*  HGB 9.3*  8.9*  HCT 28.7* 27.6*  MCV 86.1 86.7  PLT 305 269    Recent Labs  08/11/14 2015 08/12/14 0303 08/12/14 0903  TROPONINI 0.03 0.04* 0.04*   Invalid input(s): POCBNP No results for input(s): HGBA1C in the last 72 hours.   Weights: Filed Weights   08/11/14 2008 08/12/14 0040 08/13/14 0528  Weight: 217 lb (98.431 kg) 211 lb 6.4 oz (95.89 kg) 209 lb 9.6 oz (95.074 kg)     Radiology/Studies:  Ct Abdomen Pelvis Wo Contrast  08/02/2014   CLINICAL DATA:  Lower abdominal pain, acute on chronic kidney disease, anemia  EXAM: CT ABDOMEN AND PELVIS WITHOUT CONTRAST  TECHNIQUE: Multidetector CT imaging of the abdomen and pelvis was performed following the standard protocol without  IV contrast.  COMPARISON:  06/11/2014  FINDINGS: Lower chest:  Lung bases are essentially clear.  Cardiomegaly.  Hepatobiliary: Unenhanced liver is unremarkable.  Status post cholecystectomy. No intrahepatic or extrahepatic ductal dilatation.  Pancreas: Within normal limits.  Spleen: Within normal limits.  Adrenals/Urinary Tract: Adrenal glands are within normal limits.  Kidneys are notable for nonspecific stranding. Punctate nonobstructing bilateral renal calculi, measuring 1-2 mm in the right upper and lower poles (coronal images 92 and 100) and 1-2 mm in the left interpolar region (coronal image 89) and left lower pole (coronal images 74-75).  No ureteral or bladder calculi.  Thick-walled bladder.  Stomach/Bowel: Stomach is within normal limits.  No evidence of bowel obstruction.  Normal appendix.  Sigmoid diverticulosis, without evidence of diverticulitis.  Vascular/Lymphatic: Atherosclerotic calcifications of the abdominal aorta and branch vessels.  No suspicious abdominopelvic lymphadenopathy.  Reproductive: Prostatomegaly.  Other: No abdominopelvic ascites.  Moderate fat containing right inguinal hernia.  Fat within the left inguinal canal.  Musculoskeletal: Degenerative changes of the visualized thoracolumbar spine.  IMPRESSION: Bilateral nonobstructing renal calculi measuring up to 2 mm. No ureteral or bladder calculi. Mildly thick-walled bladder.  No evidence of bowel obstruction. Normal appendix. Colonic diverticulosis, without evidence of diverticulitis.  Moderate fat containing right inguinal hernia.   Electronically Signed   By: Julian Hy M.D.   On: 08/02/2014 18:51   Mr Shoulder Right Wo Contrast  08/12/2014   CLINICAL DATA:  Chronic right shoulder pain with limited range of motion.  EXAM: MRI OF THE RIGHT SHOULDER WITHOUT CONTRAST  TECHNIQUE: Multiplanar, multisequence MR imaging of the shoulder was performed. No intravenous contrast was administered.  COMPARISON:  CT of the chest from 2010   FINDINGS: Examination is very limited due to patient motion.  Rotator cuff: Significant rotator cuff tendinopathy/ tendinosis but no obvious large full-thickness retracted tear.  Muscles: Mild edema like signal abnormality in the shoulder musculature may reflect muscle strains, partial tears or more likely diffuse nonspecific myositis.  Biceps long head:  Grossly intact.  Acromioclavicular Joint: Moderate degenerative changes. The acromion is type 2-3 in shape. Mild lateral downsloping.  Glenohumeral Joint: Severe glenohumeral joint degenerative changes with joint space narrowing, osteophytic spurring and subchondral cystic change. The glenoid is markedly widened. There is a small joint effusion and moderate synovitis.  Labrum:  Difficult to assess but likely degenerated and torn.  Bones:  No acute findings.  Other: Moderate subacromial/ subdeltoid bursitis.  IMPRESSION: Very limited examination due to patient motion.  Significant rotator cuff tendinopathy/ tendinosis but no obvious large full-thickness retracted tear.  Diffuse muscle edema likely nonspecific myositis.  Severe glenohumeral joint degenerative changes.   Electronically Signed   By: Marijo Sanes M.D.   On: 08/12/2014 13:18   US  Venous Img Upper Uni Right  08/11/2014   CLINICAL DATA:  Right upper extremity pain.  Recent PICC line.  EXAM: Right UPPER EXTREMITY VENOUS DOPPLER ULTRASOUND  TECHNIQUE: Gray-scale sonography with graded compression, as well as color Doppler and duplex ultrasound were performed to evaluate the upper extremity deep venous system from the level of the subclavian vein and including the jugular, axillary, basilic, radial, ulnar and upper cephalic vein. Spectral Doppler was utilized to evaluate flow at rest and with distal augmentation maneuvers.  COMPARISON:  Chest radiograph 08/11/2014  FINDINGS: Contralateral Subclavian Vein: Respiratory phasicity is normal and symmetric with the symptomatic side. No evidence of thrombus.  Normal compressibility.  Internal Jugular Vein: No evidence of thrombus. Normal compressibility, respiratory phasicity and response to augmentation.  Subclavian Vein: No evidence of thrombus. Normal compressibility, respiratory phasicity and response to augmentation.  Axillary Vein: No evidence of thrombus. Normal compressibility, respiratory phasicity and response to augmentation.  Cephalic Vein: No evidence of thrombus. Normal compressibility, respiratory phasicity and response to augmentation.  Basilic Vein: No evidence of thrombus. Normal compressibility, respiratory phasicity and response to augmentation.  Brachial Veins: No evidence of thrombus. Normal compressibility, respiratory phasicity and response to augmentation.  Radial Veins: No evidence of thrombus. Normal compressibility, respiratory phasicity and response to augmentation.  Ulnar Veins: No evidence of thrombus. Normal compressibility, respiratory phasicity and response to augmentation.  Other Findings: There is a PICC line in the proximal basilic vein extending into the axillary and subclavian vein.  IMPRESSION: No evidence of deep venous thrombosis.   Electronically Signed   By: Suzy Bouchard M.D.   On: 08/11/2014 21:53   Dg Chest Port 1 View  08/11/2014   CLINICAL DATA:  Weakness and right-sided chest pain  EXAM: PORTABLE CHEST - 1 VIEW  COMPARISON:  08/07/2014  FINDINGS: Cardiac shadow is stable. A right-sided PICC line is again noted. The lungs are well aerated with mild right basilar atelectasis. No focal confluent infiltrate is seen. Degenerative changes of the shoulder joints are noted.  IMPRESSION: Mild right basilar atelectasis.  Stable PICC line.   Electronically Signed   By: Inez Catalina M.D.   On: 08/11/2014 20:32   Dg Chest Port 1 View  08/07/2014   CLINICAL DATA:  Acute onset of shortness of breath. Initial encounter.  EXAM: PORTABLE CHEST - 1 VIEW  COMPARISON:  Chest radiograph performed 08/06/2014  FINDINGS: The lungs are  well-aerated. Vascular congestion is noted, with bibasilar airspace opacities, concerning for mild interstitial edema. There is no evidence of pleural effusion or pneumothorax.  The cardiomediastinal silhouette is borderline normal in size. No acute osseous abnormalities are seen. A right PICC is noted ending about the distal SVC.  IMPRESSION: Vascular congestion, with bibasilar airspace opacities, concerning for mild interstitial edema.   Electronically Signed   By: Garald Balding M.D.   On: 08/07/2014 02:50   Dg Chest Port 1 View  08/06/2014   CLINICAL DATA:  PICC line placement.  EXAM: PORTABLE CHEST - 1 VIEW  COMPARISON:  08/01/2014  FINDINGS: Right PICC line is in place. The tip is at the cavoatrial junction. Heart is mildly enlarged. Mild vascular congestion. Mild bibasilar atelectasis, improving since prior study. No effusions. No acute bony abnormality. Degenerative changes in the shoulders bilaterally.  IMPRESSION: Right PICC line tip at the cavoatrial junction.  Improving bibasilar atelectasis.  Mild cardiomegaly, vascular congestion.   Electronically Signed   By: Rolm Baptise M.D.   On: 08/06/2014 10:50   Dg Chest [facility]  1 View  08/01/2014   CLINICAL DATA:  Dyspnea/short of breath.  EXAM: PORTABLE CHEST - 1 VIEW  COMPARISON:  06/06/2014.  FINDINGS: Cardiopericardial silhouette is enlarged. Perihilar and basilar opacity is present, most compatible with pulmonary edema and volume overload. Bilateral basilar atelectasis is present. Aortic arch atherosclerosis. Mediastinal contours are unchanged. Monitoring leads project over the chest.  IMPRESSION: Cardiomegaly and basilar airspace disease most compatible with mild CHF.   Electronically Signed   By: Dereck Ligas M.D.   On: 08/01/2014 17:26   US Kidneys ([facility] Hx)  08/02/2014   CLINICAL DATA:  Acute renal failure  EXAM: RENAL / URINARY TRACT ULTRASOUND COMPLETE  COMPARISON:  None.  FINDINGS: Right Kidney:  Length: 13.0 cm. 1.8 x 1.4 x 1.5 cm upper  pole renal cyst. No hydronephrosis.  Left Kidney:  Length: 12.4 cm.  No mass or hydronephrosis.  Bladder:  Within normal limits.  IMPRESSION: 1.8 cm right upper pole renal cyst.  No hydronephrosis.   Electronically Signed   By: Julian Hy M.D.   On: 08/02/2014 17:09     Assessment and Plan  1. NSVT: -2 episodes occuring overnight on the night of admision while in the ED with a heart rate of approximately 150 -Transition from amiodarone gtt to po amiodarone 400 mg bid x 1 week, 200 mg bid x 1 week, 200 mg daily -Continue Coreg 3.125 mg bid -Would ideally like to advance to po amiodarone when vitals are stable -Check echo to evaluate LV function and wall motion, results pending -Patient, his wife, and their children all do not want cardiac catheterization to evaluate for ischemia. He and they do not want long term dialysis  -Precipitating factor could certainly be ischemia and this was discussed with the patient and his wife. They are aware and wish to defer ischemic evaluation. Other etiology includes acute cystis/pyelonephritis/sepsis/atelectasis   2. Sepsis: -Patient with fever, hypotension-->improved, leukocytosis, UTI/pyelonephritis -ID has been consulted  -On ertapenem and vancomycin per IM -Poor prognosis (upset with Hospice discussion previously, though he stated to me on 5/5 "if it's my time to go then it's my time to go.")  3. Acute on chronic combined CHF: -Apparently had severely lower extremity edema on 5/2, currently improved -SOB is improved -SCr is back at baseline -#2 precludes diuresis at this time  4. Acute on CKD: -SCr bumped to 2.73 this morning, possible ATN in the setting of #2 -Close monitoring now with improved blood pressure and continued ABX -They do not want dialysis  5. Anemia: -Stable over the past 2 months -Maintain hgb >8.5    Melvern Banker, PA-C 08/13/2014 12:19 PM

## 2014-08-13 NOTE — Progress Notes (Addendum)
Pt is lethargic during shift. Pt speech seem garbled and incoherent. Pt report pain once during shift and was administered morphine. Pt had 1 bowel movement during shift. Pt was sweaty during shift. Pt was febrile at shift change and was administered tylenol. Pt is currently afebrile. Pt BS was 130s after sweaty episode. No other signs of distress noted. Will continue to monitor.

## 2014-08-13 NOTE — Care Management (Signed)
Patient is for transfer to the [facility] today

## 2014-08-13 NOTE — Progress Notes (Signed)
[facility] NAME: Darrell Richardson    MR#:  [id number]  DATE OF BIRTH:  1940/06/09  SUBJECTIVE:  CHIEF COMPLAINT:   Chief Complaint  Patient presents with  . Weakness  . Arm Injury  . Fatigue  . Fever   Appears critically ill and miserable. Right arm pain with minimal improvement, but left arm pain, and significant oral pain.   REVIEW OF SYSTEMS:  CONSTITUTIONAL:  Continues to spike fevers EYES: No blurred or double vision.  EARS, NOSE, AND THROAT: Oral pain, dry tongue, not opening mouth as grimacing with pain. RESPIRATORY: No cough, shortness of breath, wheezing or hemoptysis.  CARDIOVASCULAR: No chest pain, orthopnea, edema.  GASTROINTESTINAL: No nausea, vomiting, diarrhea or abdominal pain.  GENITOURINARY: No dysuria, hematuria.  ENDOCRINE: No polyuria, nocturia,  HEMATOLOGY: No anemia, easy bruising or bleeding SKIN: No rash or lesion. MUSCULOSKELETAL: No joint pain or arthritis.  EXTREMITIES: 1+ pedal edema, right arm significant pain and tenerness all over.  NEUROLOGIC: unable to move arms due to significant pain. PSYCHIATRY: No anxiety or depression. Confused, oriented x 3  DRUG ALLERGIES:   Allergies  Allergen Reactions  . Cortisone Anaphylaxis and Other (See Comments)    Reaction:  Cold, elevated BP, shaking all over, and fever Pts wife states that he tolerates hydrocortisone cream at home.    . Ambien [Zolpidem Tartrate] Other (See Comments)    Reaction:  Pt did not "tolerate well" or remember anything from the previous night.    . Rocephin [Ceftriaxone] Hives  . Levaquin [Levofloxacin In D5w] Rash    VITALS:  Blood pressure 137/67, pulse 79, temperature 99.3 F (37.4 C), temperature source Oral, resp. rate 18, height 5\' 8"  (1.727 m), weight 95.074 kg (209 lb 9.6 oz), SpO2 93 %.  PHYSICAL EXAMINATION:   GENERAL:  74 y.o.-year-old patient lying in the bed with no acute distress.  EYES: Pupils equal,  round, reactive to light and accommodation. No scleral icterus. Extraocular muscles intact.  HEENT: Head atraumatic, normocephalic. Oropharynx and nasopharynx clear.  NECK:  Supple, no jugular venous distention. No thyroid enlargement, no tenderness.  LUNGS: Normal breath sounds bilaterally, no wheezing, rales,rhonchi or crepitation. No use of accessory muscles of respiration. Decreased bibasilar breath sounds CARDIOVASCULAR: S1, S2 normal. No murmurs, rubs, or gallops.  ABDOMEN: Soft, nontender, nondistended. Bowel sounds present. No organomegaly or mass.  EXTREMITIES:1+edal edema, cyanosis, or clubbing.  NEUROLOGIC: Cranial nerves II through XII are intact. Sensation intact. Gait not checked.  Right arm with minimal movement , left arm significant pain/tenderness to touch. PSYCHIATRIC: The patient is alert and oriented x 3 but intermittent confusion SKIN: No obvious rash, lesion, or ulcer.    LABORATORY PANEL:   CBC  Recent Labs Lab 08/13/14 0500  WBC 11.9*  HGB 8.9*  HCT 27.6*  PLT 269   ------------------------------------------------------------------------------------------------------------------  Chemistries   Recent Labs Lab 08/12/14 0303  08/13/14 0500  NA  --   < > 140  K  --   < > 3.7  CL  --   < > 106  CO2  --   < > 24  GLUCOSE  --   < > 140*  BUN  --   < > 38*  CREATININE 2.54*  < > 2.73*  CALCIUM  --   < > 8.5*  MG 1.7  --   --   < > = values in this interval not displayed. ------------------------------------------------------------------------------------------------------------------  Cardiac Enzymes  Recent Labs  Lab 08/12/14 0903  TROPONINI 0.04*   ------------------------------------------------------------------------------------------------------------------  RADIOLOGY:  Mr Shoulder Right Wo Contrast  08/12/2014   CLINICAL DATA:  Chronic right shoulder pain with limited range of motion.  EXAM: MRI OF THE RIGHT SHOULDER WITHOUT CONTRAST   TECHNIQUE: Multiplanar, multisequence MR imaging of the shoulder was performed. No intravenous contrast was administered.  COMPARISON:  CT of the chest from 2010  FINDINGS: Examination is very limited due to patient motion.  Rotator cuff: Significant rotator cuff tendinopathy/ tendinosis but no obvious large full-thickness retracted tear.  Muscles: Mild edema like signal abnormality in the shoulder musculature may reflect muscle strains, partial tears or more likely diffuse nonspecific myositis.  Biceps long head:  Grossly intact.  Acromioclavicular Joint: Moderate degenerative changes. The acromion is type 2-3 in shape. Mild lateral downsloping.  Glenohumeral Joint: Severe glenohumeral joint degenerative changes with joint space narrowing, osteophytic spurring and subchondral cystic change. The glenoid is markedly widened. There is a small joint effusion and moderate synovitis.  Labrum:  Difficult to assess but likely degenerated and torn.  Bones:  No acute findings.  Other: Moderate subacromial/ subdeltoid bursitis.  IMPRESSION: Very limited examination due to patient motion.  Significant rotator cuff tendinopathy/ tendinosis but no obvious large full-thickness retracted tear.  Diffuse muscle edema likely nonspecific myositis.  Severe glenohumeral joint degenerative changes.   Electronically Signed   By: Marijo Sanes M.D.   On: 08/12/2014 13:18   US Venous Img Upper Uni Right  08/11/2014   CLINICAL DATA:  Right upper extremity pain.  Recent PICC line.  EXAM: Right UPPER EXTREMITY VENOUS DOPPLER ULTRASOUND  TECHNIQUE: Gray-scale sonography with graded compression, as well as color Doppler and duplex ultrasound were performed to evaluate the upper extremity deep venous system from the level of the subclavian vein and including the jugular, axillary, basilic, radial, ulnar and upper cephalic vein. Spectral Doppler was utilized to evaluate flow at rest and with distal augmentation maneuvers.  COMPARISON:  Chest  radiograph 08/11/2014  FINDINGS: Contralateral Subclavian Vein: Respiratory phasicity is normal and symmetric with the symptomatic side. No evidence of thrombus. Normal compressibility.  Internal Jugular Vein: No evidence of thrombus. Normal compressibility, respiratory phasicity and response to augmentation.  Subclavian Vein: No evidence of thrombus. Normal compressibility, respiratory phasicity and response to augmentation.  Axillary Vein: No evidence of thrombus. Normal compressibility, respiratory phasicity and response to augmentation.  Cephalic Vein: No evidence of thrombus. Normal compressibility, respiratory phasicity and response to augmentation.  Basilic Vein: No evidence of thrombus. Normal compressibility, respiratory phasicity and response to augmentation.  Brachial Veins: No evidence of thrombus. Normal compressibility, respiratory phasicity and response to augmentation.  Radial Veins: No evidence of thrombus. Normal compressibility, respiratory phasicity and response to augmentation.  Ulnar Veins: No evidence of thrombus. Normal compressibility, respiratory phasicity and response to augmentation.  Other Findings: There is a PICC line in the proximal basilic vein extending into the axillary and subclavian vein.  IMPRESSION: No evidence of deep venous thrombosis.   Electronically Signed   By: Suzy Bouchard M.D.   On: 08/11/2014 21:53   Dg Chest Port 1 View  08/11/2014   CLINICAL DATA:  Weakness and right-sided chest pain  EXAM: PORTABLE CHEST - 1 VIEW  COMPARISON:  08/07/2014  FINDINGS: Cardiac shadow is stable. A right-sided PICC line is again noted. The lungs are well aerated with mild right basilar atelectasis. No focal confluent infiltrate is seen. Degenerative changes of the shoulder joints are noted.  IMPRESSION: Mild right basilar atelectasis.  Stable PICC line.   Electronically Signed   By: Inez Catalina M.D.   On: 08/11/2014 20:32    EKG:   Orders placed or performed during the  hospital encounter of 08/11/14  . ED EKG  . 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 In the emergency department noted to have a run of nonsustained ventricular tachycardia.  * Sepsis- with fevers, tachycardia Blood cultures done, pending. On invanz for recent ESBL E.coli and also on vancomycin for ongoing fevers UA this time is not impressive ID consulted Fever could be from inflammation vs infection  *. Nonsustained ventricular tachycardia: On amiodarone, cards consulted Also on coreg Known cardiomyopathy EF 40%  * Acute kidney injury on chronic kidney disease: baseline cr 2.1, continues to get worse Likely ATN from sepsis and prerenal on adm Gentle hydration Consulted nephrology, hold diuretics and further nephrotoxic agents Family not interested in dialysis  * Myositis- significant muscle pain/tenderness, now spreading Discussed with wife about further work up for it vs symptomatic trt- family discussing about comfort care and hospice at this point- so will go ahead and start IV steroids and can be discharged on po prednisone MRI showing muscle inflamm of right arm.  recent PICC- but u/s negative for any clot. Cont lyrica for now. Also pain meds  *. Type 2 diabetes uncomplicated: on insulin sliding scale with every 6 hours Accu-Cheks.  *. Hypertension, essential: on norvasc and coreg  *. Venous thromboembolism prophylactic: Heparin subcutaneous  Patient is DNR.  Palliative care consult ordered Overall Poor prognosis, likely hospice home today   All the records are reviewed and case discussed with Care Management/Social Workerr. Management plans discussed with the patient, family and they are in  agreement.  CODE STATUS: DNR  TOTAL TIME TAKING CARE OF THIS PATIENT: 45 minutes.   POSSIBLE D/C TO HOSPICE HOME TODAY, DEPENDING ON CLINICAL CONDITION.   Nobuo Nunziata M.D on 08/13/2014 at 12:58 PM  Between 7am to 6pm - Pager - [phone]  After 6pm go to [email or web address] - password [email or web address] [facility]  Office  [phone]  CC: Primary care physician; Chrismon, DENNIS E, PA

## 2014-08-13 NOTE — Progress Notes (Signed)
Notified kollaru of mouth/throat pain,  Ordered dukes

## 2014-08-13 NOTE — Progress Notes (Signed)
Update: Call received from Palliative Medicine physician Dr. Ermalinda Memos stating that patient was appropriate for and family was in agreement with transfer to the Hospice Home due to continued decline in status. Writer met with Mrs. Weatherall, son Durene Fruits and daughter in Associate [employer], services explained, questions answered. Mrs. Teale shared that both her mother and father had died at the [facility] and she/family is well versed in care provided. Signed consents and updated information faxed to referral intake, report called to hospice home, MD, CSW, CM and RN all aware of plan. Signed DNR in place on chart. EMS notified. Thank you for the opportunity to serve Darrell Richardson and his family. Flo Shanks RN, BSN, [facility] [place], [facility] [phone]

## 2014-08-13 NOTE — Progress Notes (Signed)
MEDICATION RELATED CONSULT NOTE - FOLLOW UP   Pharmacy Consult for Medication Review Indication: drug-drug interaction with amiodarone  Allergies  Allergen Reactions  . Cortisone Anaphylaxis and Other (See Comments)    Reaction:  Cold, elevated BP, shaking all over, and fever Pts wife states that he tolerates hydrocortisone cream at home.    . Ambien [Zolpidem Tartrate] Other (See Comments)    Reaction:  Pt did not "tolerate well" or remember anything from the previous night.    . Rocephin [Ceftriaxone] Hives  . Levaquin [Levofloxacin In D5w] Rash    Patient Measurements: Height: 5\' 8"  (172.7 cm) Weight: 209 lb 9.6 oz (95.074 kg) IBW/kg (Calculated) : 68.4   Vital Signs: Temp: 99.3 F (37.4 C) (05/06 0528) Temp Source: Oral (05/06 0528) BP: 148/66 mmHg (05/06 0528) Pulse Rate: 87 (05/06 0528) Intake/Output from previous day: 05/05 0701 - 05/06 0700 In: 200 [IV Piggyback:200] Out: 325 [Urine:325] Intake/Output from this shift:    Labs:  Recent Labs  08/12/14 0303 08/12/14 0903 08/13/14 0500  WBC 8.8 11.2* 11.9*  HGB 9.2* 9.3* 8.9*  HCT 27.9* 28.7* 27.6*  PLT 292 305 269  CREATININE 2.54* 2.43* 2.73*  MG 1.7  --   --    Estimated Creatinine Clearance: 26.6 mL/min (by C-G formula based on Cr of 2.73).   Microbiology: Recent Results (from the past 720 hour(s))  Culture, blood (single)     Status: None   Collection Time: 08/01/14  4:22 PM  Result Value Ref Range Status   Micro Text Report   Final       COMMENT                   NO GROWTH AEROBICALLY/ANAEROBICALLY IN 5 DAYS   ANTIBIOTIC                                                      Urine culture     Status: None   Collection Time: 08/01/14  5:12 PM  Result Value Ref Range Status   Micro Text Report   Final       SOURCE: CLEAN CATCH    ORGANISM 1                >100,000 CFU/ML Escherichia coli   COMMENT                   ESBL (Extended Spectrum Beta Lactamase)   COMMENT                   -    ANTIBIOTIC                    ORG#1     AMPICILLIN                    R         CEFAZOLIN                     R         CEFOXITIN                     S         CEFTRIAXONE  R         CIPROFLOXACIN                 R         ERTAPENEM                     S         GENTAMICIN                    S         IMIPENEM                      S         LEVOFLOXACIN                  R         NITROFURANTOIN                S         ESBL                          POSITIVE  Trimethoprim/Sulfamethoxazole R           Culture, blood (single)     Status: None   Collection Time: 08/01/14  6:37 PM  Result Value Ref Range Status   Micro Text Report   Final       COMMENT                   NO GROWTH AEROBICALLY/ANAEROBICALLY IN 5 DAYS   ANTIBIOTIC                                                      Urine culture     Status: None (Preliminary result)   Collection Time: 08/11/14  8:12 PM  Result Value Ref Range Status   Specimen Description URINE, CLEAN CATCH  Final   Special Requests NONE  Final   Culture NO GROWTH < 12 HOURS  Final   Report Status PENDING  Incomplete  Blood culture (routine x 2)     Status: None (Preliminary result)   Collection Time: 08/11/14  8:15 PM  Result Value Ref Range Status   Specimen Description BLOOD  Final   Special Requests NONE  Final   Culture NO GROWTH < 12 HOURS  Final   Report Status PENDING  Incomplete  Blood culture (routine x 2)     Status: None (Preliminary result)   Collection Time: 08/11/14  8:16 PM  Result Value Ref Range Status   Specimen Description BLOOD  Final   Special Requests NONE  Final   Culture NO GROWTH < 12 HOURS  Final   Report Status PENDING  Incomplete    Medications:  Scheduled:  . acidophilus  2 capsule Oral Daily  . allopurinol  100 mg Oral Daily  . amLODipine  5 mg Oral BID  . aspirin EC  81 mg Oral Daily  . calcium-vitamin D   Oral Daily  . carvedilol  3.125 mg Oral BID WC  . clopidogrel  75 mg Oral Q  breakfast  . docusate sodium  100 mg Oral QHS  . ertapenem  1 g Intravenous Q24H  . ferrous sulfate  325 mg  Oral q morning - 10a  . heparin  5,000 Units Subcutaneous 3 times per day  . insulin aspart  0-5 Units Subcutaneous QHS  . insulin aspart  0-9 Units Subcutaneous TID WC  . multivitamin  1 tablet Oral Daily  . pregabalin  75 mg Oral Daily  . sodium chloride  3 mL Intravenous Q12H  . vancomycin  1,000 mg Intravenous Q24H    Assessment:  Patient is a 75 yo male admitted for nonsustained ventricular tachycardia and started on Amiodarone drip.  Patient's current scheduled medications listed above.   Amiodarone may enhance the effects of other blood pressure lowering agents.   Monitor blood pressure closely as patient is also receiving amlodipine. Last BP measurement of 179/75.  Amiodarone may enhance the effects of beta blockers to cause bradycardia.  Monitor closely as patient is also receiving carvedilol. Last HR of 84  Goal of Therapy:  Modify medications if warranted based on drug interactions with amiodarone.  Plan:  Monitor HR and BP closely while patient is receiving amiodarone drip.  Pharmacy will continue to follow.   Murrell Converse, PharmD Clinical Pharmacist 08/13/2014

## 2014-08-13 NOTE — Consult Note (Signed)
Palliative Medicine Inpatient Consult Follow Up Note   Name: Darrell Richardson Date: 08/13/2014 MRN: [id number]  DOB: 1940/04/19  Referring Physician: Gladstone Lighter, MD  Palliative Care consult requested for this 74 y.o. male for goals of medical therapy in patient with CKD III, CAD, CM, CVA, DM, admitted with A/CKD, NSVT.   Darrell Richardson is lying in bed. Appears very uncomfortable. Says he just wants to go home. Wife at bedside.    REVIEW OF SYSTEMS:  Pain: Severe and L.arm>R.arm, mouth ulcer Dyspnea:  No Nausea/Vomiting:  anorexia Diarrhea:  No Constipation:   No Depression:   Yes Anxiety:   No Fatigue:   Yes  CODE STATUS: DNR   PAST MEDICAL HISTORY: Past Medical History  Diagnosis Date  . Hearing loss   . Gout   . Diabetes   . Fatigue   . Hypertension   . Stroke     X 2  . Heart failure   . Muscle pain   . Hammertoe   . Myocardial infarction 1970s  . Chronic combined systolic and diastolic congestive heart failure   . Shortness of breath dyspnea   . Depression   . Anxiety   . CKD (chronic kidney disease), stage IV   . Headache   . Arthritis   . Anemia     PAST SURGICAL HISTORY:  Past Surgical History  Procedure Laterality Date  . Stomach surgery    . Foot surgery Right   . Tee without cardioversion N/A 06/07/2014    Procedure: TRANSESOPHAGEAL ECHOCARDIOGRAM (TEE);  Surgeon: Jolaine Artist, MD;  Location: [facility] ENDOSCOPY;  Service: Cardiovascular;  Laterality: N/A;  . Cholecystectomy      Vital Signs: BP 137/67 mmHg  Pulse 79  Temp(Src) 99.3 F (37.4 C) (Oral)  Resp 18  Ht _0  (1.727 m)  Wt 95.074 kg (209 lb 9.6 oz)  BMI 31.88 kg/m2  SpO2 93% Filed Weights   08/11/14 2008 08/12/14 0040 08/13/14 0528  Weight: 98.431 kg (217 lb) 95.89 kg (211 lb 6.4 oz) 95.074 kg (209 lb 9.6 oz)    Estimated body mass index is 31.88 kg/(m^2) as calculated from the following:   Height as of this encounter: _1  (1.727 m).   Weight as of this encounter: 95.074 kg  (209 lb 9.6 oz).  PHYSICAL EXAM: [place]: ill-appearing HEENT: unable to examine OP due to pain Neck: Trachea midline  Cardiovascular: RRR Pulmonary/Chest: coarse BS's ant fields Abdominal: Soft, NTTP. Hypoactive bowel sounds GU: No SP tenderness Extremities: + edema  Neurological: Grossly nonfocal Skin: no lesions  Psychiatric: A&O x 3, depressed    LABS: CBC:    Component Value Date/Time   WBC 11.9* 08/13/2014 0500   WBC 5.3 08/03/2014 0525   HGB 8.9* 08/13/2014 0500   HGB 8.1* 08/04/2014 0449   HCT 27.6* 08/13/2014 0500   HCT 26.4* 08/03/2014 0525   PLT 269 08/13/2014 0500   PLT 185 08/03/2014 0525   MCV 86.7 08/13/2014 0500   MCV 88 08/03/2014 0525   NEUTROABS 3.4 08/03/2014 0525   NEUTROABS 7.9* 06/14/2014 0622   LYMPHSABS 0.7* 08/03/2014 0525   LYMPHSABS 1.0 06/14/2014 0622   MONOABS 0.7 08/03/2014 0525   MONOABS 0.8 06/14/2014 0622   EOSABS 0.3 08/03/2014 0525   EOSABS 0.1 06/14/2014 0622   BASOSABS 0.1 08/03/2014 0525   BASOSABS 0.0 06/14/2014 0622   Comprehensive Metabolic Panel:    Component Value Date/Time   NA 140 08/13/2014 0500   NA 138 08/07/2014 0318  K 3.7 08/13/2014 0500   K 4.3 08/07/2014 0318   CL 106 08/13/2014 0500   CL 111 08/07/2014 0318   CO2 24 08/13/2014 0500   CO2 21* 08/07/2014 0318   BUN 38* 08/13/2014 0500   BUN 29* 08/07/2014 0318   CREATININE 2.73* 08/13/2014 0500   CREATININE 1.93* 08/07/2014 0318   GLUCOSE 140* 08/13/2014 0500   GLUCOSE 116* 08/07/2014 0318   CALCIUM 8.5* 08/13/2014 0500   CALCIUM 8.5* 08/07/2014 0318   AST 16 06/14/2014 0622   AST 18 01/05/2012 2134   ALT 26 06/14/2014 0622   ALT 22 01/05/2012 2134   ALKPHOS 67 06/14/2014 0622   ALKPHOS 57 01/05/2012 2134   BILITOT 0.6 06/14/2014 0622   PROT 5.5* 06/14/2014 0622   PROT 6.3* 01/05/2012 2134   ALBUMIN 2.2* 06/14/2014 0622   ALBUMIN 3.1* 01/05/2012 2134    IMPRESSION:  Darrell Richardson is a 74 y.o. man with CKD III, CAD, CM, CVA, DM, admitted with  A/CKD, NSVT. Pt just hospitalized 4/24-08/08/14 with ESBL producing E.coli. Discharged on ertapenem. Readmitted 08/11/14 with weakness and altered MS. He also complains of severe B.arm pain.  I spoke with pt, met separately with pt's wife and spoke with pt's daughter by phone. Wife now understands that pt may be approaching the end of life. She knows that she cannot care for him at home. She and daughter would like for pt to go to the Hospice Home. They ask that I speak with pt.   MRI of R.arm c/w myositis. Today complains of pain in L.arm and mouth ulcer. Discussed with Dr Tressia Miners. Will try empiric steroids.    PLAN: Hospice Home  REFERRALS TO BE ORDERED:  Hospice   More than 50% of the visit was spent in counseling/coordination of care: YES  Time spent: 45 minutes

## 2014-08-14 ENCOUNTER — Encounter: Payer: Self-pay | Admitting: Physician Assistant

## 2014-08-16 LAB — CULTURE, BLOOD (ROUTINE X 2): CULTURE: NO GROWTH

## 2014-08-16 MED FILL — Medication: Qty: 1 | Status: AC

## 2014-08-17 LAB — CULTURE, BLOOD (ROUTINE X 2): CULTURE: NO GROWTH

## 2014-08-20 LAB — URINE CULTURE

## 2014-08-27 ENCOUNTER — Ambulatory Visit: Payer: Medicare Other | Admitting: Family

## 2014-09-28 ENCOUNTER — Other Ambulatory Visit: Payer: Self-pay | Admitting: Family Medicine

## 2014-09-28 NOTE — Telephone Encounter (Signed)
Darrell Richardson I believe you follow this patient.  Thanks

## 2014-11-01 ENCOUNTER — Ambulatory Visit: Payer: Medicare Other | Admitting: Cardiovascular Disease

## 2015-02-22 ENCOUNTER — Encounter: Payer: Self-pay | Admitting: Nurse Practitioner

## 2015-02-22 ENCOUNTER — Other Ambulatory Visit: Payer: Self-pay

## 2015-02-22 ENCOUNTER — Ambulatory Visit (INDEPENDENT_AMBULATORY_CARE_PROVIDER_SITE_OTHER): Payer: Medicare Other | Admitting: Nurse Practitioner

## 2015-02-22 VITALS — BP 160/80 | HR 61 | Ht 68.0 in | Wt 214.5 lb

## 2015-02-22 DIAGNOSIS — I5032 Chronic diastolic (congestive) heart failure: Secondary | ICD-10-CM | POA: Diagnosis not present

## 2015-02-22 DIAGNOSIS — I10 Essential (primary) hypertension: Secondary | ICD-10-CM

## 2015-02-22 DIAGNOSIS — I2510 Atherosclerotic heart disease of native coronary artery without angina pectoris: Secondary | ICD-10-CM

## 2015-02-22 MED ORDER — CARVEDILOL 3.125 MG PO TABS: 3.1250 mg | ORAL_TABLET | Freq: Two times a day (BID) | ORAL | Status: DC

## 2015-02-22 MED ORDER — AMLODIPINE BESYLATE 2.5 MG PO TABS: 2.5000 mg | ORAL_TABLET | Freq: Every day | ORAL | Status: DC

## 2015-02-22 MED ORDER — AMLODIPINE BESYLATE 2.5 MG PO TABS: 2.5000 mg | ORAL_TABLET | Freq: Every day | ORAL | Status: AC

## 2015-02-22 MED ORDER — CARVEDILOL 3.125 MG PO TABS: 3.1250 mg | ORAL_TABLET | Freq: Two times a day (BID) | ORAL | Status: AC

## 2015-02-22 NOTE — Patient Instructions (Signed)
Medication Instructions:  Your physician has recommended you make the following change in your medication:  START taking norvasc 2.5mg  once per day   Labwork: none  Testing/Procedures: none  Follow-Up: Your physician recommends that you schedule a follow-up appointment as needed.   Any Other Special Instructions Will Be Listed Below (If Applicable).     If you need a refill on your cardiac medications before your next appointment, please call your pharmacy.

## 2015-02-22 NOTE — Progress Notes (Addendum)
Patient Name: Darrell Richardson Date of Encounter: 02/22/2015  Primary Care Provider:  Vernie Murders, PA Primary Cardiologist:  Johnny Bridge, MD   Chief Complaint  74 y/o male with a h/o diast CHF, HTN, remote MI, and CKD IV, who presents for office f/u today.  Past Medical History   Past Medical History  Diagnosis Date  . Hearing loss   . Gout   . Diabetes ([place])   . Fatigue   . Essential hypertension   . Stroke ([place])     X 2  . Chronic diastolic CHF (congestive heart failure) ([place])     a. echo 08/2014: 55-60%, mild MR, PASP 45 mm Hg.  . Muscle pain   . Hammertoe   . Myocardial infarction ([place])     a. Remote inf MI in 1970's;  b. 05/2014 MV: scar w/ mild peri-infarct ischemia, inf AK, EF 34%->Med Rx.  . Depression   . Anxiety   . CKD (chronic kidney disease), stage IV ([place])   . Headache   . Arthritis   . Anemia    Past Surgical History  Procedure Laterality Date  . Stomach surgery    . Foot surgery Right   . Tee without cardioversion N/A 06/07/2014    Procedure: TRANSESOPHAGEAL ECHOCARDIOGRAM (TEE);  Surgeon: Jolaine Artist, MD;  Location: [facility] ENDOSCOPY;  Service: Cardiovascular;  Laterality: N/A;  . Cholecystectomy     Allergies  Allergies  Allergen Reactions  . Cortisone Anaphylaxis and Other (See Comments)    Reaction:  Cold, elevated BP, shaking all over, and fever Pts wife states that he tolerates hydrocortisone cream at home.    . Ambien [Zolpidem Tartrate] Other (See Comments)    Reaction:  Pt did not "tolerate well" or remember anything from the previous night.    . Rocephin [Ceftriaxone] Hives  . Levaquin [Levofloxacin In D5w] Rash   HPI  74 y/o male with the above complex problem list.  He has a h/o remote MI in the 44's with low risk MV in 05/2014.  He was readmitted to [facility] in 07/2014 with sepsis and renal failure complicated by myositis.  He was eventually transferred to inpatient hospice where he did reasonably well and was subsequently discharged.   He has been @ home with home hospice.  He has done reasonably well.  He denies chest pain, palpitations, dyspnea, pnd, orthopnea, n, v, dizziness, syncope, edema, weight gain, or early satiety. He does tire easily however.  Hospice staff has been monitoring his BP and have noted that he's trending mostly in the 150's to 170's.  This appt was established in order to address his BP regimen.  Home Medications  Prior to Admission medications   Medication Sig Start Date End Date Taking? Authorizing Provider  acetaminophen (TYLENOL) 650 MG suppository Place 1 suppository (650 mg total) rectally every 6 (six) hours as needed for mild pain (or Fever >/= 101). 08/13/14  Yes Grayland Jack Phifer, MD  allopurinol (ZYLOPRIM) 100 MG tablet Take 1 tablet (100 mg total) by mouth daily. 06/23/14  Yes Daniel J Angiulli, PA-C  carvedilol (COREG) 3.125 MG tablet Take 1 tablet (3.125 mg total) by mouth 2 (two) times daily with a meal. 02/22/15  Yes Rogelia Mire, NP  docusate sodium (COLACE) 100 MG capsule Take 100 mg by mouth at bedtime.   Yes Historical Provider, MD  furosemide (LASIX) 80 MG tablet Take 80 mg by mouth daily.  02/15/15  Yes Historical Provider, MD  hydrOXYzine (ATARAX/VISTARIL) 25  MG tablet TAKE 1 TABLET BY MOUTH EVERY 4 HOURS AS NEEDED FOR ITCHING 09/28/14  Yes Dennis E Chrismon, PA  LORazepam (ATIVAN) 1 MG tablet Take 1 tablet (1 mg total) by mouth every hour as needed for anxiety. 08/13/14  Yes Grayland Jack Phifer, MD  morphine 20 MG/5ML solution Take 1.3 mLs (5.2 mg total) by mouth every hour as needed for pain. 08/13/14  Yes Grayland Jack Phifer, MD  triamcinolone cream (KENALOG) 0.1 % APPLY TO AFFECTED AREA TWICE A DAY FOR 7 DAYS 11/16/14  Yes Historical Provider, MD  amLODipine (NORVASC) 2.5 MG tablet Take 1 tablet (2.5 mg total) by mouth daily. 02/22/15   Rogelia Mire, NP    Review of Systems  Tires easily.  Fairly sedentary.  He denies chest pain, palpitations, dyspnea, pnd, orthopnea, n, v, dizziness,  syncope, edema, weight gain, or early satiety.  All other systems reviewed and are otherwise negative except as noted above.  Physical Exam  VS:  BP 160/80 mmHg  Pulse 61  Ht 5\' 8"  (1.727 m)  Wt 214 lb 8 oz (97.297 kg)  BMI 32.62 kg/m2 , BMI Body mass index is 32.62 kg/(m^2). GEN: Well nourished, well developed, in no acute distress. HEENT: normal. Neck: Supple, no JVD, bilat carotid bruits L>R. Cardiac: RRR, no murmurs, rubs, or gallops. No clubbing, cyanosis, edema.  Radials/DP/PT 2+ and equal bilaterally.  Respiratory:  Respirations regular and unlabored, clear to auscultation bilaterally. GI: Soft, nontender, nondistended, BS + x 4. MS: no deformity or atrophy. Skin: warm and dry, no rash. Neuro:  Strength and sensation are intact. Psych: Normal affect.  Accessory Clinical Findings  None  Assessment & Plan  1.  Essential HTN:  BP's have been trending in the 150-170 range.  He's 160/80 today.  He is asymptomatic.  He is on coreg 3.125 bid and lasix 80 daily.  His baseline HR is 61, thus further titration of BB @ this time is not really feasible.  No ACEI/ARB 2/2 CKD IV.  I will add amlodipine 2.5 mg daily.  He and his wife will continue to follow his BP's @ home and will notify us if he continues to trend above 140 mmHg.  If so, then we can titrate his amlodipine to 5 mg and subsequently 10 mg daily if necessary.  2.  Chronic Diastolic CHF:  Euvolemic on exam.  Wts relatively stable @ home.  No recent dyspnea, orthopnea, edema.  HR stable but BP up.  Adding amlodipine 2.5mg  today as above.  Discussed possible side effect of ankle edema.  Pt and wife will follow and let us know if it develops.  3.  CKD IV:  Followed by primary care.  Stable as far as pt knows.  4.  Bilateral Carotid Bruits:  He is not interested in carotid u/s.   5.  Dispo:  Pt prefers to f/u prn and will call if BP's remain elevated.  [name], NP 02/22/2015, 1:55 PM

## 2015-04-07 DIAGNOSIS — J449 Chronic obstructive pulmonary disease, unspecified: Secondary | ICD-10-CM | POA: Insufficient documentation

## 2015-04-12 ENCOUNTER — Encounter: Payer: Self-pay | Admitting: Family Medicine

## 2015-04-12 ENCOUNTER — Ambulatory Visit (INDEPENDENT_AMBULATORY_CARE_PROVIDER_SITE_OTHER): Payer: Medicare Other | Admitting: Family Medicine

## 2015-04-12 VITALS — BP 172/68 | HR 60 | Temp 97.8°F | Resp 14 | Wt 216.0 lb

## 2015-04-12 DIAGNOSIS — E1122 Type 2 diabetes mellitus with diabetic chronic kidney disease: Secondary | ICD-10-CM | POA: Diagnosis not present

## 2015-04-12 DIAGNOSIS — I10 Essential (primary) hypertension: Secondary | ICD-10-CM

## 2015-04-12 DIAGNOSIS — N184 Chronic kidney disease, stage 4 (severe): Secondary | ICD-10-CM

## 2015-04-12 DIAGNOSIS — I5042 Chronic combined systolic (congestive) and diastolic (congestive) heart failure: Secondary | ICD-10-CM

## 2015-04-12 DIAGNOSIS — I509 Heart failure, unspecified: Secondary | ICD-10-CM | POA: Insufficient documentation

## 2015-04-12 MED ORDER — LORAZEPAM 1 MG PO TABS: 1.0000 mg | ORAL_TABLET | Freq: Three times a day (TID) | ORAL | Status: AC | PRN

## 2015-04-12 MED ORDER — FUROSEMIDE 80 MG PO TABS: 80.0000 mg | ORAL_TABLET | Freq: Every day | ORAL | Status: DC

## 2015-04-12 MED ORDER — HYDROCODONE-ACETAMINOPHEN 5-325 MG PO TABS: 1.0000 | ORAL_TABLET | Freq: Four times a day (QID) | ORAL | Status: DC | PRN

## 2015-04-12 NOTE — Progress Notes (Signed)
Patient ID: Darrell Richardson, male   DOB: [birth year], 75 y.o.   MRN: [id number]   Patient: Darrell Richardson Male    DOB: February 08, 1941   75 y.o.   MRN: [id number] Visit Date: 04/12/2015  Today's Provider: Vernie Murders, PA   Chief Complaint  Patient presents with  . Hospitalization Follow-up  . Hypertension  . Follow-up   Subjective:    HPI Patient is following up after being discharged from Hospice care for CHF, HTN, remote MI and CKD stage IV. Had MI in the 70's 05-2014. Was readmitted to [facility] in 07-2014 with sepsis and renal failure complicated by myositis. Was eventually transferred to inpatient hospice and did reasonably well. Was discharged home with hospice care and discharged from their care before Christmas 2016. Cardiologist (Dr. Candis Musa) continues to follow up on CHF and HTN. Presently on Amlodipine, Lasix and Coreg. Has been able to out hunting some recently. Denies dyspnea, edema and off all diabetes medications. States BS averages 140-150 at home (random). Uses Lorazepam and Morphine prn BP elevation, chest discomfort and "feeling bad" (last doses 4 days ago) per hospice instructions. Has DNR and does not want to go back to any hospital.   Patient Active Problem List   Diagnosis Date Noted  . CHF (congestive heart failure) ([place]) 04/12/2015  . Chronic obstructive pulmonary disease ([place]) 04/07/2015  . Essential hypertension   . Chronic diastolic CHF (congestive heart failure) ([place])   . NSVT (nonsustained ventricular tachycardia) ([place]) 08/11/2014  . AKI (acute kidney injury) ([place]) 08/11/2014  . CAFL (chronic airflow limitation) ([place]) 08/04/2014  . Arteriosclerosis of coronary artery 08/04/2014  . H/O: gout 08/04/2014  . Adiposity 08/04/2014  . Abnormal presence of protein in urine 08/04/2014  . History of colonic polyps 08/03/2014  . Hyperlipidemia 07/30/2014  . CAD in native artery   . Chronic combined systolic and diastolic heart failure ([place]) 06/12/2014  . ESR raised 06/12/2014  .  Elevated PSA 06/12/2014  . Debilitated 06/11/2014  . Palliative care encounter 06/09/2014  . Myalgia   . Polyarthralgia   . Primary gout   . Secondary cardiomyopathy ([place])   . Diabetes type 2, uncontrolled ([place])   . History of CVA (cerebrovascular accident)   . CKD (chronic kidney disease), stage IV ([place]) 05/31/2014  . Hypertension   . Benign neoplasm of skin 02/02/2014  . Stroke ([place]) 02/02/2014  . Diabetes mellitus, type 2 ([place]) 02/02/2014  . Gout 02/02/2014  . Abdominal pain, lower 02/02/2014  . Avitaminosis D 02/02/2014  . Acute myocardial infarction [facility]) 02/02/2014   Past Surgical History  Procedure Laterality Date  . Stomach surgery    . Foot surgery Right   . Tee without cardioversion N/A 06/07/2014    Procedure: TRANSESOPHAGEAL ECHOCARDIOGRAM (TEE);  Surgeon: Jolaine Artist, MD;  Location: [facility] ENDOSCOPY;  Service: Cardiovascular;  Laterality: N/A;  . Cholecystectomy     Family History  Problem Relation Age of Onset  . Dementia Mother   . Depression Mother       Allergies  Allergen Reactions  . Cortisone Anaphylaxis and Other (See Comments)    Reaction:  Cold, elevated BP, shaking all over, and fever Pts wife states that he tolerates hydrocortisone cream at home.    . Ambien [Zolpidem Tartrate] Other (See Comments)    Reaction:  Pt did not "tolerate well" or remember anything from the previous night.    . Rocephin [Ceftriaxone] Hives  . Levaquin [Levofloxacin In D5w] Rash    Previous Medications  ACETAMINOPHEN (TYLENOL) 650 MG SUPPOSITORY    Place 1 suppository (650 mg total) rectally every 6 (six) hours as needed for mild pain (or Fever >/= 101).   ALLOPURINOL (ZYLOPRIM) 100 MG TABLET    Take 1 tablet (100 mg total) by mouth daily.   ALUM & MAG HYDROXIDE-SIMETH (MYLANTA) 209-470-96 MG/5ML SUSPENSION    Take by mouth as needed for indigestion or heartburn.   AMLODIPINE (NORVASC) 2.5 MG TABLET    Take 1 tablet (2.5 mg total) by mouth daily.   CARVEDILOL  (COREG) 3.125 MG TABLET    Take 1 tablet (3.125 mg total) by mouth 2 (two) times daily with a meal.   DOCUSATE SODIUM (COLACE) 100 MG CAPSULE    Take 100 mg by mouth at bedtime.   FUROSEMIDE (LASIX) 80 MG TABLET    Take 80 mg by mouth daily.    HALOPERIDOL (HALDOL) 2 MG/ML SOLUTION    Take by mouth as needed for agitation.   HYDROXYZINE (ATARAX/VISTARIL) 25 MG TABLET    TAKE 1 TABLET BY MOUTH EVERY 4 HOURS AS NEEDED FOR ITCHING   LORAZEPAM (ATIVAN) 1 MG TABLET    Take 1 tablet (1 mg total) by mouth every hour as needed for anxiety.   MORPHINE 20 MG/5ML SOLUTION    Take 1.3 mLs (5.2 mg total) by mouth every hour as needed for pain.   NYSTATIN (MYCOSTATIN) POWDER    Apply topically as needed.   SENNOSIDES-DOCUSATE SODIUM (SENOKOT-S) 8.6-50 MG TABLET    Take 1-3 tablets by mouth as needed for constipation.   TRIAMCINOLONE CREAM (KENALOG) 0.1 %    APPLY TO AFFECTED AREA TWICE A DAY FOR 7 DAYS   ZINC OXIDE PO    Take by mouth.    Review of Systems  Constitutional: Negative.   HENT: Negative.   Eyes: Negative.   Respiratory: Negative.   Cardiovascular: Negative.   Endocrine: Negative.   Genitourinary: Negative.   Musculoskeletal: Negative.   Skin: Negative.   Allergic/Immunologic: Negative.   Neurological: Negative.   Hematological: Negative.   Psychiatric/Behavioral: Negative.     Social History  Substance Use Topics  . Smoking status: Former Research scientist (life sciences)  . Smokeless tobacco: Not on file  . Alcohol Use: No   Objective:   BP 172/68 mmHg  Pulse 60  Temp(Src) 97.8 F (36.6 C) (Oral)  Resp 14  Wt 216 lb (97.977 kg) Wt Readings from Last 3 Encounters:  04/12/15 216 lb (97.977 kg)  02/22/15 214 lb 8 oz (97.297 kg)  08/13/14 209 lb 9.6 oz (95.074 kg)    Physical Exam  Constitutional: He is oriented to person, place, and time. He appears well-developed and well-nourished.  HENT:  Head: Normocephalic.  Eyes: Conjunctivae and EOM are normal.  Neck: Normal range of motion. Neck  supple.  Cardiovascular: Normal rate, regular rhythm and normal heart sounds.   Hx carotid bruits followed by cardiologist.  Pulmonary/Chest: Effort normal and breath sounds normal.  Abdominal: Soft. Bowel sounds are normal.  Musculoskeletal: He exhibits no edema.  Intermittent foot pain with history of gout in left first MTP.  Neurological: He is alert and oriented to person, place, and time.  Psychiatric: He has a normal mood and affect. His behavior is normal.      Assessment & Plan:     1. Essential hypertension Fairly stable BP (diastolic reading elevated today). Presently on Amlodipine, Lasix and Coreg for CHF and BP control. Encouraged to continue regular follow up with Dr. Rockey Situ and recheck pending  lab reports.  2. Chronic combined systolic and diastolic heart failure ([place]) Followed routinely by cardiologist (Dr. Rockey Situ) with history of EF 55-60% on 08/2014 with history of MI in 1970's. No significant edema today. Has improved enough to be discharged from Hospice home care. Will refill Lasix and encouraged to continue routine follow up with Dr. Rockey Situ. Has brought in his DNR and Living will with his wife's approval today. States he wants to stay at home as much as possible and not go to any drastic measures to sustain his life. - furosemide (LASIX) 80 MG tablet; Take 1 tablet (80 mg total) by mouth daily.  Dispense: 30 tablet; Refill: 3  3. Type 2 diabetes mellitus with stage 4 chronic kidney disease, without long-term current use of insulin (HCC) Has stopped all insulin/diabetic medications. States he feels better and BS averaging 140-150 now (random checks). Denies hypoglycemic episodes. Will recheck labs. May need to restart a little medication pending reports. Continue diabetic diet and some walking exercise. - CBC with Differential/Platelet - COMPLETE METABOLIC PANEL WITH GFR - Hemoglobin A1c

## 2015-04-13 LAB — CBC WITH DIFFERENTIAL/PLATELET
BASOS ABS: 0.1 10*3/uL (ref 0.0–0.2)
Basos: 1 %
EOS (ABSOLUTE): 0.4 10*3/uL (ref 0.0–0.4)
Eos: 6 %
HEMOGLOBIN: 12.4 g/dL — AB (ref 12.6–17.7)
Hematocrit: 36.4 % — ABNORMAL LOW (ref 37.5–51.0)
Immature Grans (Abs): 0 10*3/uL (ref 0.0–0.1)
Immature Granulocytes: 0 %
LYMPHS ABS: 1.5 10*3/uL (ref 0.7–3.1)
Lymphs: 25 %
MCH: 30.5 pg (ref 26.6–33.0)
MCHC: 34.1 g/dL (ref 31.5–35.7)
MCV: 89 fL (ref 79–97)
Monocytes Absolute: 0.5 10*3/uL (ref 0.1–0.9)
Monocytes: 9 %
Neutrophils Absolute: 3.6 10*3/uL (ref 1.4–7.0)
Neutrophils: 59 %
PLATELETS: 221 10*3/uL (ref 150–379)
RBC: 4.07 x10E6/uL — AB (ref 4.14–5.80)
RDW: 13.4 % (ref 12.3–15.4)
WBC: 6.1 10*3/uL (ref 3.4–10.8)

## 2015-04-13 LAB — COMPREHENSIVE METABOLIC PANEL
A/G RATIO: 1.5 (ref 1.1–2.5)
ALT: 13 IU/L (ref 0–44)
AST: 13 IU/L (ref 0–40)
Albumin: 3.8 g/dL (ref 3.5–4.8)
Alkaline Phosphatase: 85 IU/L (ref 39–117)
BILIRUBIN TOTAL: 0.2 mg/dL (ref 0.0–1.2)
BUN/Creatinine Ratio: 16 (ref 10–22)
BUN: 45 mg/dL — ABNORMAL HIGH (ref 8–27)
CALCIUM: 9 mg/dL (ref 8.6–10.2)
CHLORIDE: 96 mmol/L (ref 96–106)
CO2: 24 mmol/L (ref 18–29)
Creatinine, Ser: 2.85 mg/dL — ABNORMAL HIGH (ref 0.76–1.27)
GFR, EST AFRICAN AMERICAN: 24 mL/min/{1.73_m2} — AB (ref >59)
GFR, EST NON AFRICAN AMERICAN: 21 mL/min/{1.73_m2} — AB (ref >59)
GLOBULIN, TOTAL: 2.6 g/dL (ref 1.5–4.5)
Glucose: 127 mg/dL — ABNORMAL HIGH (ref 65–99)
POTASSIUM: 4.9 mmol/L (ref 3.5–5.2)
SODIUM: 136 mmol/L (ref 134–144)
TOTAL PROTEIN: 6.4 g/dL (ref 6.0–8.5)

## 2015-04-13 LAB — HEMOGLOBIN A1C
Est. average glucose Bld gHb Est-mCnc: 160 mg/dL
HEMOGLOBIN A1C: 7.2 % — AB (ref 4.8–5.6)

## 2015-04-14 ENCOUNTER — Telehealth: Payer: Self-pay

## 2015-04-14 NOTE — Telephone Encounter (Signed)
-----   Message from Margo Common, [place] sent at 04/14/2015  1:31 AM EST ----- Red blood cells and hgb much improved. No significant anemia now. Blood sugar in good shape with hgb A1C nearly down to goal of 7.0. Kidney function no better but not significantly worse, either. Continue present diet and medication regimen. Recheck progress in 3 months.

## 2015-04-14 NOTE — Telephone Encounter (Signed)
Patient's wife Darrell Richardson advised as directed below. Peggy verbalized understanding, and states she will call back to schedule a follow up appointment.

## 2015-05-12 ENCOUNTER — Telehealth: Payer: Self-pay | Admitting: Family Medicine

## 2015-05-12 NOTE — Telephone Encounter (Signed)
Pt needs refill on his HYDROcodone-acetaminophen (NORCO) 5-325 MG tablet 04/12/15 -- Vickki Muff Chrismon, PA Take 1 tablet by mouth every 6 (six) hours as needed for moderate pain.   Thanks Con Memos

## 2015-05-13 MED ORDER — HYDROCODONE-ACETAMINOPHEN 5-325 MG PO TABS: 1.0000 | ORAL_TABLET | Freq: Four times a day (QID) | ORAL | Status: DC | PRN

## 2015-05-13 NOTE — Telephone Encounter (Signed)
Weaning off the Morphine (has not used it in 3 weeks - was only using it once at night while under Hospice Care - has been discharged). Refilled Hydrocodone but must not use it together with the Morphine. Was in [place] due to CHF. Patient agrees with plan.

## 2015-06-06 ENCOUNTER — Other Ambulatory Visit: Payer: Self-pay | Admitting: Family Medicine

## 2015-06-06 MED ORDER — HYDROCODONE-ACETAMINOPHEN 5-325 MG PO TABS: 1.0000 | ORAL_TABLET | Freq: Four times a day (QID) | ORAL | Status: DC | PRN

## 2015-06-06 NOTE — Telephone Encounter (Signed)
Pt's wife contacted office for refill request on the following medications: HYDROcodone-acetaminophen (NORCO) 5-325 MG tablet. Last written 05/13/15. Last OV 04/12/15. Thanks TNP

## 2015-06-06 NOTE — Telephone Encounter (Signed)
Advise wife prescriptions is ready for pick up and should schedule follow up appointment in the next 2-3 months.

## 2015-06-07 NOTE — Telephone Encounter (Signed)
Patient's wife Darrell Richardson advised as directed below. Darrell Richardson states she will call back to schedule a follow up appointment.

## 2015-06-14 ENCOUNTER — Ambulatory Visit
Admission: RE | Admit: 2015-06-14 | Discharge: 2015-06-14 | Disposition: A | Discharge status: Home / Self Care | Payer: Medicare Other | Source: Ambulatory Visit | Attending: Family Medicine | Admitting: Family Medicine

## 2015-06-14 ENCOUNTER — Ambulatory Visit (INDEPENDENT_AMBULATORY_CARE_PROVIDER_SITE_OTHER): Payer: Medicare Other | Admitting: Family Medicine

## 2015-06-14 ENCOUNTER — Encounter: Payer: Self-pay | Admitting: Family Medicine

## 2015-06-14 VITALS — BP 142/60 | HR 69 | Temp 98.5°F | Resp 16 | Wt 216.8 lb

## 2015-06-14 DIAGNOSIS — R0781 Pleurodynia: Secondary | ICD-10-CM | POA: Insufficient documentation

## 2015-06-14 NOTE — Progress Notes (Signed)
Patient: Darrell Richardson Male    DOB: [birth year]   75 y.o.   MRN: [id number] Visit Date: 06/14/2015  Today's Provider: Vernie Murders, PA   Chief Complaint  Patient presents with  . Fall   Subjective:    Fall The accident occurred more than 1 week ago (it will be 3 weeks Friday). The fall occurred while walking (tripped over while walking at home.). He landed on grass. There was no blood loss. Point of impact: Left side by the chest and his left rib is hurting. Pain location: Left rib. The pain is at a severity of 8/10. The pain is moderate. The symptoms are aggravated by movement, pressure on injury and rotation (while eating.). Pertinent negatives include no bowel incontinence, loss of consciousness, nausea, numbness, tingling or vomiting. He has tried acetaminophen (Hydrocodone) for the symptoms. The treatment provided no relief.       Allergies  Allergen Reactions  . Cortisone Anaphylaxis and Other (See Comments)    Reaction:  Cold, elevated BP, shaking all over, and fever Pts wife states that he tolerates hydrocortisone cream at home.    . Ambien [Zolpidem Tartrate] Other (See Comments)    Reaction:  Pt did not "tolerate well" or remember anything from the previous night.    . Rocephin [Ceftriaxone] Hives  . Levaquin [Levofloxacin In D5w] Rash   Previous Medications   ACETAMINOPHEN (TYLENOL) 650 MG SUPPOSITORY    Place 1 suppository (650 mg total) rectally every 6 (six) hours as needed for mild pain (or Fever >/= 101).   ALLOPURINOL (ZYLOPRIM) 100 MG TABLET    Take 1 tablet (100 mg total) by mouth daily.   ALUM & MAG HYDROXIDE-SIMETH (MYLANTA) [id number] MG/5ML SUSPENSION    Take by mouth as needed for indigestion or heartburn. Reported on 06/14/2015   AMLODIPINE (NORVASC) 2.5 MG TABLET    Take 1 tablet (2.5 mg total) by mouth daily.   CARVEDILOL (COREG) 3.125 MG TABLET    Take 1 tablet (3.125 mg total) by mouth 2 (two) times daily with a meal.   DOCUSATE SODIUM (COLACE)  100 MG CAPSULE    Take 100 mg by mouth at bedtime.   FUROSEMIDE (LASIX) 80 MG TABLET    Take 1 tablet (80 mg total) by mouth daily.   HALOPERIDOL (HALDOL) 2 MG/ML SOLUTION    Take by mouth as needed for agitation.   HYDROCODONE-ACETAMINOPHEN (NORCO) 5-325 MG TABLET    Take 1 tablet by mouth every 6 (six) hours as needed for moderate pain.   HYDROXYZINE (ATARAX/VISTARIL) 25 MG TABLET    TAKE 1 TABLET BY MOUTH EVERY 4 HOURS AS NEEDED FOR ITCHING   LORAZEPAM (ATIVAN) 1 MG TABLET    Take 1 tablet (1 mg total) by mouth every 8 (eight) hours as needed for anxiety.   MORPHINE 20 MG/5ML SOLUTION    Take 1.3 mLs (5.2 mg total) by mouth every hour as needed for pain.   NYSTATIN (MYCOSTATIN) POWDER    Apply topically as needed. Reported on 06/14/2015   SENNOSIDES-DOCUSATE SODIUM (SENOKOT-S) 8.6-50 MG TABLET    Take 1-3 tablets by mouth as needed for constipation.   TRIAMCINOLONE CREAM (KENALOG) 0.1 %    Reported on 06/14/2015   ZINC OXIDE PO    Take by mouth. Reported on 06/14/2015    Review of Systems  Gastrointestinal: Negative for nausea, vomiting and bowel incontinence.  Neurological: Negative for tingling, loss of consciousness and numbness.    Social History  Substance Use Topics  . Smoking status: Former Research scientist (life sciences)  . Smokeless tobacco: Not on file  . Alcohol Use: No   Objective:   BP 142/60 mmHg  Pulse 69  Temp(Src) 98.5 F (36.9 C) (Oral)  Resp 16  Wt 216 lb 12.8 oz (98.34 kg)  Physical Exam  Constitutional: He is oriented to person, place, and time. He appears well-developed and well-nourished.  HENT:  Head: Normocephalic.  Cardiovascular: Normal rate and regular rhythm.   Pulmonary/Chest: Effort normal and breath sounds normal. He exhibits tenderness.  Very tender left axillary ribs.  Neurological: He is alert and oriented to person, place, and time.      Assessment & Plan:     1. Rib pain on left side [place] in his yard at home when he tripped over a tree limb on the ground 2-3  weeks ago. Landed on his left side with his elbow under him. Very tender to palpate 5th or 6th rib in the axilla. Lungs are clear but has sharp pain with deep breath. Pain stops with splinting ribs. Recommend using abdominal binder he has at home to stabilize ribs and use Norco prn pain at bedtime. Scheduled for x-ray and recheck pending report. - DG Ribs Unilateral Left       Vernie Murders, PA  [facility]

## 2015-06-14 NOTE — Patient Instructions (Signed)

## 2015-06-15 ENCOUNTER — Telehealth: Payer: Self-pay | Admitting: Family Medicine

## 2015-06-15 NOTE — Telephone Encounter (Signed)
Dr. Caryn Section, this is a Darrell Richardson patient. Could you read his xray from yesterday? Thanks!

## 2015-06-15 NOTE — Telephone Encounter (Signed)
Wife advised-aa 

## 2015-06-15 NOTE — Telephone Encounter (Signed)
Pt's wife requested some one call back with the Xray results. I advised Darrell Richardson was out of office she requested someone else look at the results. Please advise. Thanks TNP

## 2015-06-15 NOTE — Telephone Encounter (Signed)
LMTCB

## 2015-06-15 NOTE — Telephone Encounter (Signed)
No rib fractures. There is severe arthritis in shoulder.

## 2015-06-16 NOTE — Telephone Encounter (Signed)
-----   Message from Margo Common, [place] sent at 06/16/2015  1:44 PM EST ----- No specific rib fracture seen. Sometime cracks in the cartilage can respond with the same pain of rib bone fracture but not show up on x-ray. Would continue rib support with elastic binder during the day and recheck lungs in 2-3 weeks if pain persists.

## 2015-06-16 NOTE — Telephone Encounter (Signed)
Ana advised wife of lab results on 06/15/2015

## 2015-06-22 ENCOUNTER — Other Ambulatory Visit: Payer: Self-pay | Admitting: Family Medicine

## 2015-06-22 NOTE — Telephone Encounter (Signed)
Pt contacted office for refill request on the following medications:  HYDROcodone-acetaminophen (NORCO) 5-325 MG tablet.  CB#[phone]/MW

## 2015-06-24 MED ORDER — HYDROCODONE-ACETAMINOPHEN 5-325 MG PO TABS: 1.0000 | ORAL_TABLET | Freq: Four times a day (QID) | ORAL | Status: DC | PRN

## 2015-06-24 NOTE — Telephone Encounter (Signed)
Patient's wife advised RX is ready for pick up.

## 2015-07-11 ENCOUNTER — Encounter: Payer: Self-pay | Admitting: Family Medicine

## 2015-07-11 ENCOUNTER — Ambulatory Visit (INDEPENDENT_AMBULATORY_CARE_PROVIDER_SITE_OTHER): Payer: Medicare Other | Admitting: Family Medicine

## 2015-07-11 VITALS — BP 138/80 | HR 58 | Temp 97.8°F | Resp 16 | Wt 219.0 lb

## 2015-07-11 DIAGNOSIS — I5042 Chronic combined systolic (congestive) and diastolic (congestive) heart failure: Secondary | ICD-10-CM | POA: Diagnosis not present

## 2015-07-11 DIAGNOSIS — I10 Essential (primary) hypertension: Secondary | ICD-10-CM

## 2015-07-11 DIAGNOSIS — E1122 Type 2 diabetes mellitus with diabetic chronic kidney disease: Secondary | ICD-10-CM

## 2015-07-11 DIAGNOSIS — N184 Chronic kidney disease, stage 4 (severe): Secondary | ICD-10-CM

## 2015-07-11 DIAGNOSIS — Z8639 Personal history of other endocrine, nutritional and metabolic disease: Secondary | ICD-10-CM

## 2015-07-11 DIAGNOSIS — E1142 Type 2 diabetes mellitus with diabetic polyneuropathy: Secondary | ICD-10-CM | POA: Diagnosis not present

## 2015-07-11 DIAGNOSIS — Z8739 Personal history of other diseases of the musculoskeletal system and connective tissue: Secondary | ICD-10-CM

## 2015-07-11 MED ORDER — ALLOPURINOL 100 MG PO TABS: 100.0000 mg | ORAL_TABLET | Freq: Every day | ORAL | Status: DC

## 2015-07-11 MED ORDER — HYDROCODONE-ACETAMINOPHEN 5-325 MG PO TABS: 1.0000 | ORAL_TABLET | Freq: Four times a day (QID) | ORAL | Status: DC | PRN

## 2015-07-11 NOTE — Progress Notes (Signed)
Patient ID: Darrell Richardson, male   DOB: [birth year], 75 y.o.   MRN: [id number]       Patient: Darrell Richardson Male    DOB: [birth year]   75 y.o.   MRN: [id number] Visit Date: 07/11/2015  Today's Provider: Vernie Murders, PA   Chief Complaint  Patient presents with  . Hypertension  . Congestive Heart Failure  . Diabetes   Subjective:    HPI  Diabetes Mellitus Type II, Follow-up:   Lab Results  Component Value Date   HGBA1C 7.2* 04/12/2015   HGBA1C 6.1* 08/03/2014   HGBA1C 7.5* 05/31/2014   Last seen for diabetes 3 months ago.  Management since then includes none. He reports good compliance with treatment. He is not having side effects.  Current symptoms include none and have been unchanged. Home blood sugar records: 150's-170's  Episodes of hypoglycemia? no   Current Insulin Regimen: n/a Most Recent Eye Exam: 3 years Weight trend: has gained  Current exercise: yard work  ------------------------------------------------------------------------   Hypertension, follow-up:  BP Readings from Last 3 Encounters:  07/11/15 138/80  06/14/15 142/60  04/12/15 172/68    He was last seen for hypertension 3 months ago.  BP at that visit was 142/60. Management since that visit includes none .He reports good compliance with treatment. He is not having side effects.  He is not exercising. He is adherent to low salt diet.   Outside blood pressures are 150's/80's. He is experiencing none.  Patient denies chest pain, chest pressure/discomfort, claudication, dyspnea, exertional chest pressure/discomfort, fatigue, irregular heart beat, lower extremity edema, near-syncope, orthopnea, palpitations, paroxysmal nocturnal dyspnea and syncope.   Cardiovascular risk factors include advanced age (older than 26 for men, 79 for women), diabetes mellitus, dyslipidemia, hypertension and male gender.  ------------------------------------------------------------------------   Patient Active  Problem List   Diagnosis Date Noted  . CHF (congestive heart failure) ([place]) 04/12/2015  . Chronic obstructive pulmonary disease ([place]) 04/07/2015  . Essential hypertension   . Chronic diastolic CHF (congestive heart failure) ([place])   . NSVT (nonsustained ventricular tachycardia) ([place]) 08/11/2014  . AKI (acute kidney injury) ([place]) 08/11/2014  . CAFL (chronic airflow limitation) ([place]) 08/04/2014  . Arteriosclerosis of coronary artery 08/04/2014  . H/O: gout 08/04/2014  . Adiposity 08/04/2014  . Abnormal presence of protein in urine 08/04/2014  . History of colonic polyps 08/03/2014  . Hyperlipidemia 07/30/2014  . CAD in native artery   . Chronic combined systolic and diastolic heart failure ([place]) 06/12/2014  . ESR raised 06/12/2014  . Elevated PSA 06/12/2014  . Debilitated 06/11/2014  . Palliative care encounter 06/09/2014  . Myalgia   . Polyarthralgia   . Primary gout   . Secondary cardiomyopathy ([place])   . Diabetes type 2, uncontrolled ([place])   . History of CVA (cerebrovascular accident)   . CKD (chronic kidney disease), stage IV ([place]) 05/31/2014  . Hypertension   . Benign neoplasm of skin 02/02/2014  . Stroke ([place]) 02/02/2014  . Diabetes mellitus, type 2 ([place]) 02/02/2014  . Gout 02/02/2014  . Abdominal pain, lower 02/02/2014  . Avitaminosis D 02/02/2014  . Acute myocardial infarction [facility]) 02/02/2014   Past Surgical History  Procedure Laterality Date  . Stomach surgery    . Foot surgery Right   . Tee without cardioversion N/A 06/07/2014    Procedure: TRANSESOPHAGEAL ECHOCARDIOGRAM (TEE);  Surgeon: Jolaine Artist, MD;  Location: [facility] ENDOSCOPY;  Service: Cardiovascular;  Laterality: N/A;  . Cholecystectomy     Family History  Problem Relation  Age of Onset  . Dementia Mother   . Depression Mother    Allergies  Allergen Reactions  . Cortisone Anaphylaxis and Other (See Comments)    Reaction:  Cold, elevated BP, shaking all over, and fever Pts wife states that he  tolerates hydrocortisone cream at home.    . Ambien [Zolpidem Tartrate] Other (See Comments)    Reaction:  Pt did not "tolerate well" or remember anything from the previous night.    . Rocephin [Ceftriaxone] Hives  . Levaquin [Levofloxacin In D5w] Rash   Previous Medications   ACETAMINOPHEN (TYLENOL) 650 MG SUPPOSITORY    Place 1 suppository (650 mg total) rectally every 6 (six) hours as needed for mild pain (or Fever >/= 101).   ALLOPURINOL (ZYLOPRIM) 100 MG TABLET    Take 1 tablet (100 mg total) by mouth daily.   ALUM & MAG HYDROXIDE-SIMETH (MYLANTA) [phone] MG/5ML SUSPENSION    Take by mouth as needed for indigestion or heartburn. Reported on 06/14/2015   AMLODIPINE (NORVASC) 2.5 MG TABLET    Take 1 tablet (2.5 mg total) by mouth daily.   ASPIRIN (ASPIRIN EC) 81 MG EC TABLET    Take 81 mg by mouth daily. Swallow whole.   CARVEDILOL (COREG) 3.125 MG TABLET    Take 1 tablet (3.125 mg total) by mouth 2 (two) times daily with a meal.   DOCUSATE SODIUM (COLACE) 100 MG CAPSULE    Take 100 mg by mouth at bedtime.   FUROSEMIDE (LASIX) 80 MG TABLET    Take 1 tablet (80 mg total) by mouth daily.   HALOPERIDOL (HALDOL) 2 MG/ML SOLUTION    Take by mouth as needed for agitation.   HYDROCODONE-ACETAMINOPHEN (NORCO) 5-325 MG TABLET    Take 1 tablet by mouth every 6 (six) hours as needed for moderate pain.   HYDROXYZINE (ATARAX/VISTARIL) 25 MG TABLET    TAKE 1 TABLET BY MOUTH EVERY 4 HOURS AS NEEDED FOR ITCHING   LORAZEPAM (ATIVAN) 1 MG TABLET    Take 1 tablet (1 mg total) by mouth every 8 (eight) hours as needed for anxiety.   MORPHINE 20 MG/5ML SOLUTION    Take 1.3 mLs (5.2 mg total) by mouth every hour as needed for pain.   NYSTATIN (MYCOSTATIN) POWDER    Apply topically as needed. Reported on 06/14/2015   SENNOSIDES-DOCUSATE SODIUM (SENOKOT-S) 8.6-50 MG TABLET    Take 1-3 tablets by mouth as needed for constipation.   TRIAMCINOLONE CREAM (KENALOG) 0.1 %    Reported on 06/14/2015   ZINC OXIDE PO    Take by  mouth. Reported on 06/14/2015    Review of Systems  Constitutional: Positive for fatigue.  HENT: Negative.   Eyes: Negative.   Respiratory: Negative.   Cardiovascular: Negative.   Gastrointestinal: Negative.   Endocrine: Negative.   Genitourinary: Negative.   Musculoskeletal: Negative.   Skin: Negative.   Allergic/Immunologic: Negative.   Neurological: Negative.   Hematological: Negative.   Psychiatric/Behavioral: Negative.     Social History  Substance Use Topics  . Smoking status: Former Research scientist (life sciences)  . Smokeless tobacco: Not on file  . Alcohol Use: No   Objective:   BP 138/80 mmHg  Pulse 58  Temp(Src) 97.8 F (36.6 C) (Oral)  Resp 16  Wt 219 lb (99.338 kg)  SpO2 99% Wt Readings from Last 3 Encounters:  07/11/15 219 lb (99.338 kg)  06/14/15 216 lb 12.8 oz (98.34 kg)  04/12/15 216 lb (97.977 kg)    Physical Exam  Constitutional: He  is oriented to person, place, and time. He appears well-developed and well-nourished.  HENT:  Head: Normocephalic.  Right Ear: External ear normal.  Left Ear: External ear normal.  Nose: Nose normal.  Mouth/Throat: Oropharynx is clear and moist.  Eyes: Conjunctivae and EOM are normal.  Neck: Neck supple.  Cardiovascular: Normal rate, regular rhythm and normal heart sounds.   Pulmonary/Chest: Effort normal and breath sounds normal.  Abdominal: Soft. Bowel sounds are normal.  Neurological: He is alert and oriented to person, place, and time.  Balance and depth perception poor when going down stairs.   Skin:  Two sebaceous cysts on the left side of neck.  Psychiatric: He has a normal mood and affect. His behavior is normal.      Assessment & Plan:     1. Type 2 diabetes mellitus with stage 4 chronic kidney disease, without long-term current use of insulin (HCC) Feeling improved and continues to use diet to control BS. Hgb A1C was 7.2 on 04-12-15 with average estimated BS of 127. No longer on any insulin or oral diabetes medications. Will  check labs and encouraged to watch diet. May need follow up with nephrologist. History of renal failure and hospitalization May 2016. - CBC with Differential/Platelet - Comprehensive metabolic panel - Hemoglobin A1c  2. Essential hypertension Good BP reading today. No significant peripheral edema recently. Still on Amlodipine 2.5 mg qd and Carvedilol 3.150m BID. Recheck labs and continue efforts to control weight, limit fats in diet and restrict sodium intake. - CBC with Differential/Platelet - Comprehensive metabolic panel  3. Chronic combined systolic and diastolic heart failure (HCC) Echocardiogram showed EF of 40-45% with inferior wall akinesis Feb. 2016. No significant edema recently and keeps Lasix 80 mg to use prn weight gain/edema. Will recheck routine labs and should consider follow up with cardiologist (Dr. GRockey Situ. - Comprehensive metabolic panel  4. H/O: gout No recent flares/attacks. Will recheck uric acid level and refill Allopurinol. - Uric acid - allopurinol (ZYLOPRIM) 100 MG tablet; Take 1 tablet (100 mg total) by mouth daily.  Dispense: 30 tablet; Refill: 1  5. Diabetic peripheral neuropathy (HCC) Unchanged decreased sensation with pins and needles in feet and lower legs. No help from Lyrica in the past. Also, feels legs are restless at night. One Norco at bedtime usually will allow him to sleep without return of neuropathy until the next day. Will refill Norco and recheck routine labs. Recheck routinely in 3 months. - HYDROcodone-acetaminophen (NORCO) 5-325 MG tablet; Take 1 tablet by mouth every 6 (six) hours as needed for moderate pain.  Dispense: 30 tablet; Refill: 0[place] PA  [facility]

## 2015-07-12 ENCOUNTER — Telehealth: Payer: Self-pay | Admitting: Family Medicine

## 2015-07-12 LAB — COMPREHENSIVE METABOLIC PANEL
ALT: 14 IU/L (ref 0–44)
AST: 15 IU/L (ref 0–40)
Albumin/Globulin Ratio: 1.5 (ref 1.2–2.2)
Albumin: 3.7 g/dL (ref 3.5–4.8)
Alkaline Phosphatase: 76 IU/L (ref 39–117)
BUN/Creatinine Ratio: 15 (ref 10–24)
BUN: 48 mg/dL — ABNORMAL HIGH (ref 8–27)
Bilirubin Total: <0.2 mg/dL (ref 0.0–1.2)
CHLORIDE: 98 mmol/L (ref 96–106)
CO2: 23 mmol/L (ref 18–29)
CREATININE: 3.16 mg/dL — AB (ref 0.76–1.27)
Calcium: 8.8 mg/dL (ref 8.6–10.2)
GFR calc Af Amer: 21 mL/min/{1.73_m2} — ABNORMAL LOW (ref >59)
GFR calc non Af Amer: 18 mL/min/{1.73_m2} — ABNORMAL LOW (ref >59)
GLOBULIN, TOTAL: 2.5 g/dL (ref 1.5–4.5)
Glucose: 103 mg/dL — ABNORMAL HIGH (ref 65–99)
Potassium: 5.5 mmol/L — ABNORMAL HIGH (ref 3.5–5.2)
SODIUM: 138 mmol/L (ref 134–144)
Total Protein: 6.2 g/dL (ref 6.0–8.5)

## 2015-07-12 LAB — CBC WITH DIFFERENTIAL/PLATELET
BASOS ABS: 0.1 10*3/uL (ref 0.0–0.2)
Basos: 1 %
EOS (ABSOLUTE): 0.3 10*3/uL (ref 0.0–0.4)
Eos: 5 %
Hematocrit: 37.8 % (ref 37.5–51.0)
Hemoglobin: 12.9 g/dL (ref 12.6–17.7)
Immature Grans (Abs): 0 10*3/uL (ref 0.0–0.1)
Immature Granulocytes: 0 %
LYMPHS ABS: 1.4 10*3/uL (ref 0.7–3.1)
Lymphs: 27 %
MCH: 30.5 pg (ref 26.6–33.0)
MCHC: 34.1 g/dL (ref 31.5–35.7)
MCV: 89 fL (ref 79–97)
MONOS ABS: 0.4 10*3/uL (ref 0.1–0.9)
Monocytes: 7 %
Neutrophils Absolute: 3.2 10*3/uL (ref 1.4–7.0)
Neutrophils: 60 %
Platelets: 271 10*3/uL (ref 150–379)
RBC: 4.23 x10E6/uL (ref 4.14–5.80)
RDW: 13.4 % (ref 12.3–15.4)
WBC: 5.3 10*3/uL (ref 3.4–10.8)

## 2015-07-12 LAB — HEMOGLOBIN A1C
Est. average glucose Bld gHb Est-mCnc: 143 mg/dL
HEMOGLOBIN A1C: 6.6 % — AB (ref 4.8–5.6)

## 2015-07-12 LAB — URIC ACID: URIC ACID: 8.6 mg/dL (ref 3.7–8.6)

## 2015-07-12 NOTE — Telephone Encounter (Signed)
Pt's wife Vickii Chafe wanted to see if pt's labs results from 07/11/15 were back. Please advise. Thanks TNP

## 2015-07-13 ENCOUNTER — Other Ambulatory Visit: Payer: Self-pay | Admitting: Family Medicine

## 2015-07-13 ENCOUNTER — Telehealth: Payer: Self-pay

## 2015-07-13 DIAGNOSIS — N184 Chronic kidney disease, stage 4 (severe): Secondary | ICD-10-CM

## 2015-07-13 NOTE — Telephone Encounter (Signed)
Patient's wife Vickii Chafe advised that we will call when lab results are ready.

## 2015-07-13 NOTE — Telephone Encounter (Signed)
LMTCB

## 2015-07-13 NOTE — Telephone Encounter (Signed)
Patient's wife Vickii Chafe (on consent) advised as directed below. Peggy verbalized understanding. Nephrology referral in progress. Lab results printed at front desk for pick up per wife's request.

## 2015-07-13 NOTE — Telephone Encounter (Signed)
-----   Message from Margo Common, [place] sent at 07/13/2015  9:01 AM EDT ----- No sign of anemia or infection. Blood sugar and Hgb A1C in very good shape. Uric acid level high but kidney function worsening and probably part of the reason for fatigue issues.  Need evaluation by nephrologist. (order for referral placed in chart).

## 2015-07-19 ENCOUNTER — Other Ambulatory Visit: Payer: Self-pay | Admitting: Nephrology

## 2015-07-19 DIAGNOSIS — N184 Chronic kidney disease, stage 4 (severe): Secondary | ICD-10-CM

## 2015-07-22 ENCOUNTER — Telehealth: Payer: Self-pay | Admitting: Family Medicine

## 2015-07-22 ENCOUNTER — Other Ambulatory Visit: Payer: Self-pay | Admitting: Family Medicine

## 2015-07-22 DIAGNOSIS — E1142 Type 2 diabetes mellitus with diabetic polyneuropathy: Secondary | ICD-10-CM

## 2015-07-22 MED ORDER — HYDROCODONE-ACETAMINOPHEN 5-325 MG PO TABS: 1.0000 | ORAL_TABLET | Freq: Four times a day (QID) | ORAL | Status: DC | PRN

## 2015-07-22 NOTE — Telephone Encounter (Signed)
Pt contacted office for refill request on the following medications:  HYDROcodone-acetaminophen (NORCO) 5-325 MG tablet.  CB#[phone]/MW

## 2015-07-22 NOTE — Progress Notes (Signed)
Patient's wife advised RX is ready for pick up.

## 2015-07-26 ENCOUNTER — Ambulatory Visit: Payer: Medicare Other

## 2015-08-03 ENCOUNTER — Telehealth: Payer: Self-pay | Admitting: Family Medicine

## 2015-08-03 DIAGNOSIS — E1142 Type 2 diabetes mellitus with diabetic polyneuropathy: Secondary | ICD-10-CM

## 2015-08-03 NOTE — Telephone Encounter (Signed)
Last refill on 07/22/2015

## 2015-08-03 NOTE — Telephone Encounter (Signed)
Pt needs refill on his HYDROcodone-acetaminophen (NORCO) 5-325 MG tablet 07/22/15 -- Vickki Muff Chrismon, PA Take 1 tablet by mouth every 6 (six) hours as needed for moderate pain  Thanks Con Memos

## 2015-08-04 MED ORDER — HYDROCODONE-ACETAMINOPHEN 5-325 MG PO TABS: 1.0000 | ORAL_TABLET | Freq: Four times a day (QID) | ORAL | Status: DC | PRN

## 2015-08-04 NOTE — Telephone Encounter (Signed)
Refill of Norco printed and placed in the front desk file for pick up.

## 2015-08-19 ENCOUNTER — Other Ambulatory Visit: Payer: Self-pay | Admitting: Family Medicine

## 2015-08-19 DIAGNOSIS — E1142 Type 2 diabetes mellitus with diabetic polyneuropathy: Secondary | ICD-10-CM

## 2015-08-19 MED ORDER — HYDROCODONE-ACETAMINOPHEN 5-325 MG PO TABS: 1.0000 | ORAL_TABLET | Freq: Four times a day (QID) | ORAL | Status: DC | PRN

## 2015-08-19 NOTE — Telephone Encounter (Signed)
Advise patient medication refilled and the written prescription is at the front desk for pick up.

## 2015-08-19 NOTE — Telephone Encounter (Signed)
Please review. Thanks!  

## 2015-08-19 NOTE — Telephone Encounter (Signed)
Pt contacted office for refill request on the following medications:  HYDROcodone-acetaminophen (NORCO) 5-325 MG tablet.  CB#[phone]/MW

## 2015-08-22 NOTE — Telephone Encounter (Signed)
Patient's wife advised RX is at front desk for pick up.

## 2015-09-06 ENCOUNTER — Other Ambulatory Visit: Payer: Self-pay | Admitting: Family Medicine

## 2015-09-06 DIAGNOSIS — E1142 Type 2 diabetes mellitus with diabetic polyneuropathy: Secondary | ICD-10-CM

## 2015-09-06 NOTE — Telephone Encounter (Signed)
Please review, Simona Huh out this week-aa

## 2015-09-06 NOTE — Telephone Encounter (Signed)
Pt contacted office for refill request on the following medications:  HYDROcodone-acetaminophen (NORCO) 5-325 MG tablet.  CB#[phone]/MW  This is Dennis's pt.

## 2015-09-07 NOTE — Telephone Encounter (Signed)
Pt's wife called to see if RX for HYDROcodone-acetaminophen (NORCO) 5-325 MG tablet is ready. Wife stated that pt would take his last dose tonight. Please advise. Thanks TNP

## 2015-09-07 NOTE — Telephone Encounter (Signed)
Ok to rf for 1 month. 

## 2015-09-08 MED ORDER — HYDROCODONE-ACETAMINOPHEN 5-325 MG PO TABS: 1.0000 | ORAL_TABLET | Freq: Four times a day (QID) | ORAL | Status: DC | PRN

## 2015-09-08 NOTE — Telephone Encounter (Signed)
Done and called patient to let him know-aa

## 2015-09-13 ENCOUNTER — Other Ambulatory Visit: Payer: Self-pay

## 2015-09-13 DIAGNOSIS — I5042 Chronic combined systolic (congestive) and diastolic (congestive) heart failure: Secondary | ICD-10-CM

## 2015-09-13 MED ORDER — FUROSEMIDE 80 MG PO TABS: 80.0000 mg | ORAL_TABLET | Freq: Every day | ORAL | Status: AC

## 2015-09-13 NOTE — Telephone Encounter (Signed)
Pharmacy requesting refills. Thanks!  

## 2015-09-19 ENCOUNTER — Other Ambulatory Visit: Payer: Self-pay | Admitting: Family Medicine

## 2015-09-22 ENCOUNTER — Telehealth: Payer: Self-pay | Admitting: Family Medicine

## 2015-09-22 DIAGNOSIS — E1142 Type 2 diabetes mellitus with diabetic polyneuropathy: Secondary | ICD-10-CM

## 2015-09-22 MED ORDER — HYDROCODONE-ACETAMINOPHEN 5-325 MG PO TABS: 1.0000 | ORAL_TABLET | Freq: Four times a day (QID) | ORAL | Status: DC | PRN

## 2015-09-22 NOTE — Telephone Encounter (Signed)
Wife advised-aa 

## 2015-09-22 NOTE — Telephone Encounter (Signed)
Advise patient medication prescription printed and ready for pick up at the front desk.

## 2015-09-22 NOTE — Telephone Encounter (Signed)
Pt's wife is calling for med refill on HYDROcodone-acetaminophen (NORCO) 5-325 MG tablet.  He will be out over the weekend.

## 2015-10-04 LAB — BASIC METABOLIC PANEL
CREATININE: 3.8 mg/dL — AB (ref <=1.3)
GLUCOSE: 211 mg/dL

## 2015-10-06 ENCOUNTER — Telehealth: Payer: Self-pay | Admitting: Family Medicine

## 2015-10-06 DIAGNOSIS — E1142 Type 2 diabetes mellitus with diabetic polyneuropathy: Secondary | ICD-10-CM

## 2015-10-06 NOTE — Telephone Encounter (Signed)
Pt needs refill of HYDROcodone-acetaminophen (NORCO) 5-325 MG tablet, pt only has 6 left

## 2015-10-07 MED ORDER — HYDROCODONE-ACETAMINOPHEN 5-325 MG PO TABS: 1.0000 | ORAL_TABLET | Freq: Four times a day (QID) | ORAL | Status: AC | PRN

## 2015-10-17 ENCOUNTER — Ambulatory Visit (INDEPENDENT_AMBULATORY_CARE_PROVIDER_SITE_OTHER): Payer: Medicare Other | Admitting: Family Medicine

## 2015-10-17 ENCOUNTER — Encounter: Payer: Self-pay | Admitting: Family Medicine

## 2015-10-17 VITALS — BP 128/70 | HR 60 | Temp 97.5°F | Resp 16 | Wt 215.0 lb

## 2015-10-17 DIAGNOSIS — N184 Chronic kidney disease, stage 4 (severe): Secondary | ICD-10-CM

## 2015-10-17 DIAGNOSIS — E1122 Type 2 diabetes mellitus with diabetic chronic kidney disease: Secondary | ICD-10-CM

## 2015-10-17 DIAGNOSIS — I10 Essential (primary) hypertension: Secondary | ICD-10-CM | POA: Diagnosis not present

## 2015-10-17 DIAGNOSIS — G6289 Other specified polyneuropathies: Secondary | ICD-10-CM

## 2015-10-17 LAB — POCT GLYCOSYLATED HEMOGLOBIN (HGB A1C)
Est. average glucose Bld gHb Est-mCnc: 160
HEMOGLOBIN A1C: 7.2

## 2015-10-17 NOTE — Progress Notes (Signed)
Patient: Darrell Richardson Male    DOB: 07-07-1940   75 y.o.   MRN: [id number] Visit Date: 10/17/2015  Today's Provider: Vernie Murders, PA   Chief Complaint  Patient presents with  . Diabetes  . Hypertension   Subjective:    HPI  Hypertension, follow-up:  BP Readings from Last 3 Encounters:  10/17/15 128/70  07/11/15 138/80  06/14/15 142/60    He was last seen for hypertension 3 months ago.  BP at that visit was 138/80. Management since that visit includes no changes. He reports good compliance with treatment. He is not having side effects.  He is not exercising. He is adherent to low salt diet.   Outside blood pressures are checked occasionally. Patient denies chest pain, irregular heart beat, palpitations and tachypnea.    Weight trend: stable Wt Readings from Last 3 Encounters:  10/17/15 215 lb (97.523 kg)  07/11/15 219 lb (99.338 kg)  06/14/15 216 lb 12.8 oz (98.34 kg)    Current diet: well balanced    Diabetes Mellitus Type II, Follow-up:   Lab Results  Component Value Date   HGBA1C 6.6* 07/11/2015   HGBA1C 7.2* 04/12/2015   HGBA1C 6.1* 08/03/2014    Last seen for diabetes 3 months ago.  Management since then includes no changes. He reports good compliance with treatment. He is not having side effects.   Episodes of hypoglycemia? no   Weight trend: stable Prior visit with dietician: no Current diet: well balanced Current exercise: none  Pertinent Labs:    Component Value Date/Time   CHOL 221* 01/07/2012 0552   TRIG 232* 01/07/2012 0552   HDL 31* 01/07/2012 0552   LDLCALC 144* 01/07/2012 0552   CREATININE 3.16* 07/11/2015 1441   CREATININE 1.93* 08/07/2014 0318    Wt Readings from Last 3 Encounters:  10/17/15 215 lb (97.523 kg)  07/11/15 219 lb (99.338 kg)  06/14/15 216 lb 12.8 oz (98.34 kg)    ------------------------------------------------------------------------   Past Medical History  Diagnosis Date  . Hearing loss    . Gout   . Diabetes ([place])   . Fatigue   . Essential hypertension   . Stroke ([place])     X 2  . Chronic diastolic CHF (congestive heart failure) ([place])     a. echo 08/2014: 55-60%, mild MR, PASP 45 mm Hg.  . Muscle pain   . Hammertoe   . Myocardial infarction ([place])     a. Remote inf MI in 1970's;  b. 05/2014 MV: scar w/ mild peri-infarct ischemia, inf AK, EF 34%->Med Rx.  . Depression   . Anxiety   . CKD (chronic kidney disease), stage IV ([place])   . Headache   . Arthritis   . Anemia    Past Surgical History  Procedure Laterality Date  . Stomach surgery    . Foot surgery Right   . Tee without cardioversion N/A 06/07/2014    Procedure: TRANSESOPHAGEAL ECHOCARDIOGRAM (TEE);  Surgeon: Jolaine Artist, MD;  Location: [facility] ENDOSCOPY;  Service: Cardiovascular;  Laterality: N/A;  . Cholecystectomy     Family History  Problem Relation Age of Onset  . Dementia Mother   . Depression Mother    Allergies  Allergen Reactions  . Cortisone Anaphylaxis and Other (See Comments)    Reaction:  Cold, elevated BP, shaking all over, and fever Pts wife states that he tolerates hydrocortisone cream at home.    Marland Kitchen Ambien [Zolpidem Tartrate] Other (See Comments)    Reaction:  Pt did not "tolerate well" or remember anything from the previous night.    . Rocephin [Ceftriaxone] Hives  . Levaquin [Levofloxacin In D5w] Rash   Current Meds  Medication Sig  . allopurinol (ZYLOPRIM) 100 MG tablet TAKE ONE (1) TABLET BY MOUTH EVERY DAY  . amLODipine (NORVASC) 2.5 MG tablet Take 1 tablet (2.5 mg total) by mouth daily.  Marland Kitchen aspirin (ASPIRIN EC) 81 MG EC tablet Take 81 mg by mouth daily. Swallow whole.  . carvedilol (COREG) 3.125 MG tablet Take 1 tablet (3.125 mg total) by mouth 2 (two) times daily with a meal.  . docusate sodium (COLACE) 100 MG capsule Take 100 mg by mouth at bedtime.  . ergocalciferol (VITAMIN D2) 50000 units capsule Take 50,000 Units by mouth every 30 (thirty) days.  . furosemide (LASIX) 80  MG tablet Take 1 tablet (80 mg total) by mouth daily.  . haloperidol (HALDOL) 2 MG/ML solution Take by mouth as needed for agitation.  Marland Kitchen HYDROcodone-acetaminophen (NORCO) 5-325 MG tablet Take 1 tablet by mouth every 6 (six) hours as needed for moderate pain.  Marland Kitchen LORazepam (ATIVAN) 1 MG tablet Take 1 tablet (1 mg total) by mouth every 8 (eight) hours as needed for anxiety.  Marland Kitchen morphine 20 MG/5ML solution Take 1.3 mLs (5.2 mg total) by mouth every hour as needed for pain.  Marland Kitchen nystatin (MYCOSTATIN) powder Apply topically as needed. Reported on 06/14/2015  . sennosides-docusate sodium (SENOKOT-S) 8.6-50 MG tablet Take 1-3 tablets by mouth as needed for constipation.  . sodium bicarbonate 650 MG tablet Take 650 mg by mouth 2 (two) times daily.  Marland Kitchen triamcinolone cream (KENALOG) 0.1 % Reported on 06/14/2015  . ZINC OXIDE PO Take by mouth. Reported on 06/14/2015    Review of Systems  Constitutional: Negative.   Respiratory: Negative.   Cardiovascular: Negative.   Endocrine: Negative.   Musculoskeletal: Negative.     Social History  Substance Use Topics  . Smoking status: Former Research scientist (life sciences)  . Smokeless tobacco: Not on file  . Alcohol Use: No   Objective:   BP 128/70 mmHg  Pulse 60  Temp(Src) 97.5 F (36.4 C)  Resp 16  Wt 215 lb (97.523 kg)  Physical Exam  Constitutional: He is oriented to person, place, and time. He appears well-developed and well-nourished.  HENT:  Head: Normocephalic.  Right Ear: External ear normal.  Left Ear: External ear normal.  Nose: Nose normal.  Mouth/Throat: Oropharynx is clear and moist.  Eyes: Conjunctivae are normal.  Unchanged pterygium left encroaching on medial cornea.  Neck: Neck supple.  2 cm cystic lesion on left side of neck near mastoid. No tenderness or redness.  Cardiovascular: Normal rate and regular rhythm.   Pulmonary/Chest: Effort normal and breath sounds normal.  Abdominal: Soft. Bowel sounds are normal.  Musculoskeletal:  Peripheral neuropathy  both feet with hammer toes right 2nd and 3rd toes. Good pulses both feet.  Neurological: He is alert and oriented to person, place, and time.  Psychiatric:  Sad mood with resignation of poor prognosis. Decided to not go onto dialysis.      Assessment & Plan:     1. Type 2 diabetes mellitus with stage 4 chronic kidney disease, without long-term current use of insulin (HCC) Hgb A1C is 7.2% with estimated average glucose of 160 today. Labs from nephrologist on 10-03-15 showed glucose of 211, hgb 11.5 with creatinine of 3.79. Eating habits have been lax since he has declined dialysis and states nephrologist's prognosis is poor. He has a  DNR and nephrologist restarted Hospice visits at home. Declines additional medications and states he is "ready to go". Wife seems to be agreeable with his decision. Recheck in 3-4 months. - POCT glycosylated hemoglobin (Hb A1C)  2. Essential hypertension Very well controlled today. Readings at home have been much higher occasionally with episodes of tachycardia. Again declines additional medications.   3. Other polyneuropathy (HCC) Pain in feet with occasional bouts of gout controlled by Norco 1-2 tablets at bedtime only and Allopurinol. Hospice has Morphine solution to use prn.     Vernie Murders, PA  [facility]

## 2015-11-03 ENCOUNTER — Encounter: Payer: Self-pay | Admitting: Family Medicine

## 2016-01-08 DEATH — deceased

## 2016-02-16 ENCOUNTER — Ambulatory Visit: Payer: Medicare Other | Admitting: Family Medicine

## 2017-01-08 IMAGING — CR DG CHEST 1V PORT
1 series · 1 of 1 positions shown · non-contrast
Comparison: Prior chest x-ray 06/03/2014

CLINICAL DATA: 73-year-old male with congestive heart failure

EXAM:
PORTABLE CHEST - 1 VIEW

[AP]
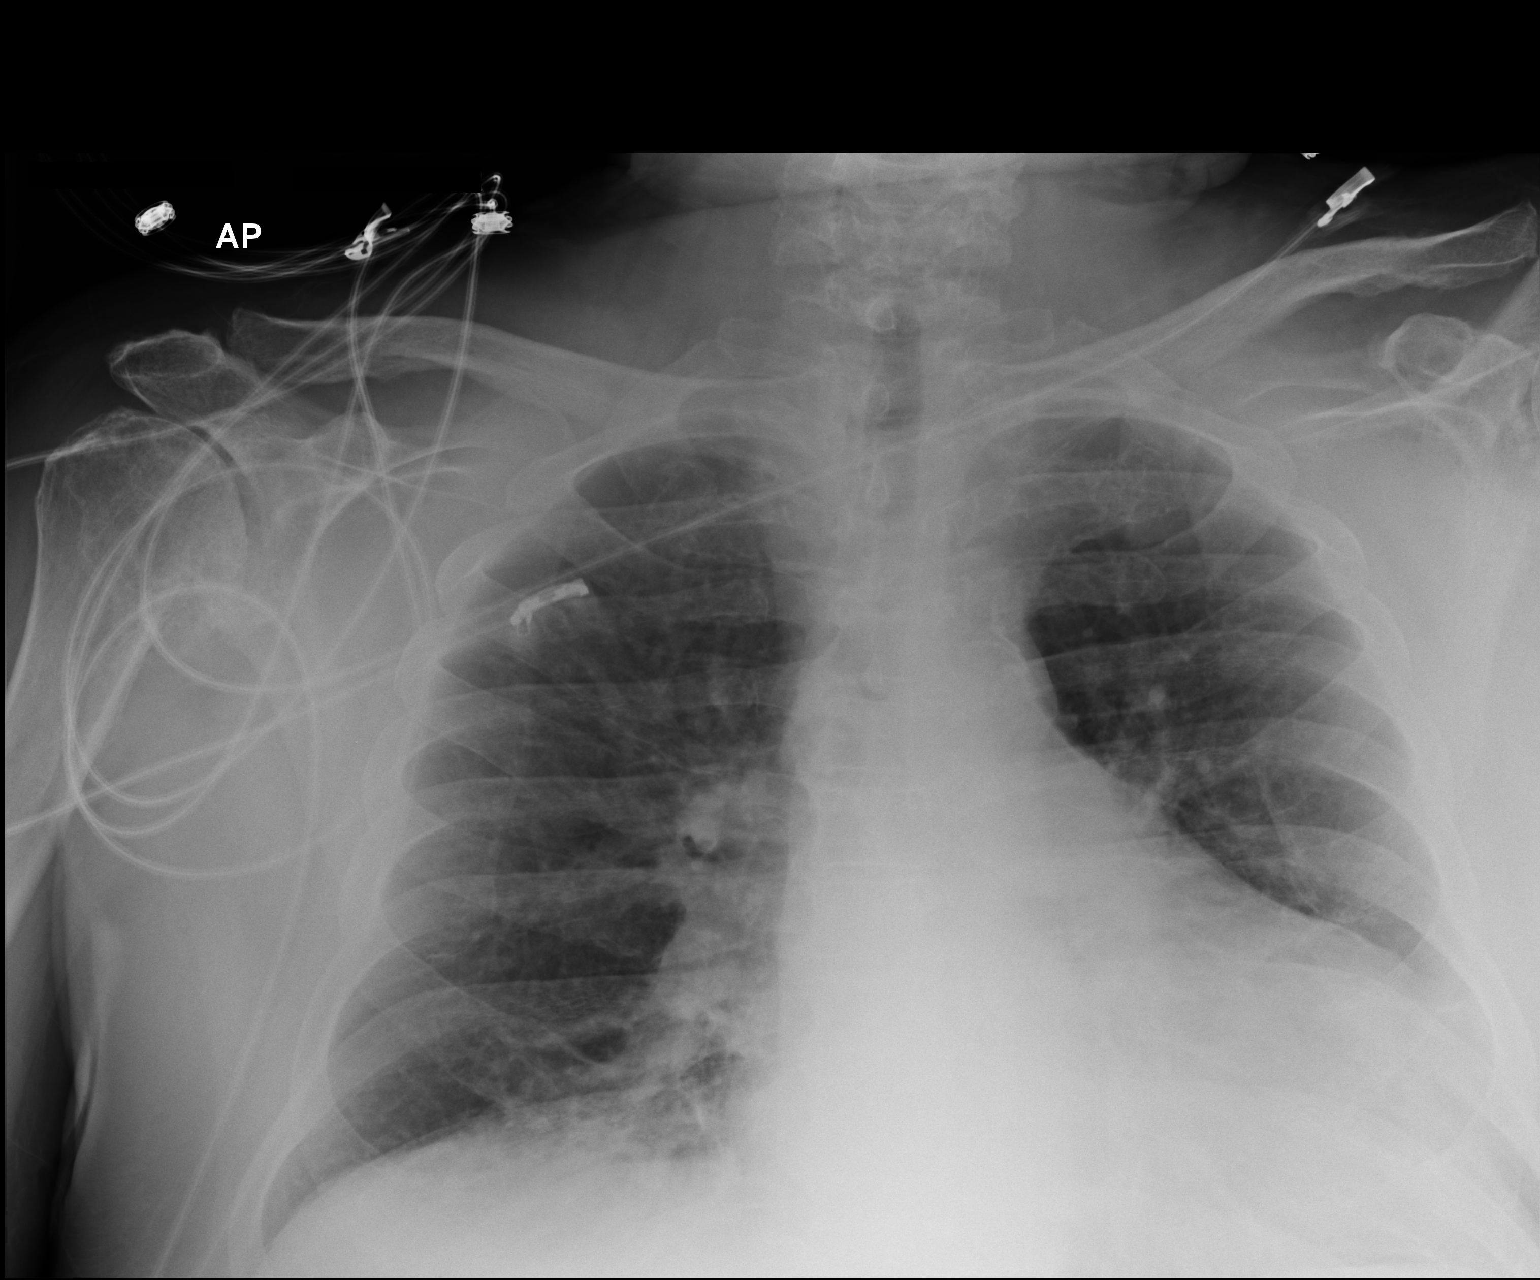

[1 of 1 positions shown; findings below may reference images not displayed]

FINDINGS: Stable cardiomegaly with left heart prominence. Decreased
interstitial pulmonary edema but persistent pulmonary vascular
congestion. Left retrocardiac opacity remains unchanged. No large
pleural effusion and no evidence of pneumothorax. No acute osseous
abnormality.
IMPRESSION: 1. Resolving pulmonary interstitial edema with persistent vascular
congestion.
2. Unchanged left retrocardiac opacity favored to reflect a
combination of atelectasis and perhaps pleural effusion.

## 2017-03-05 IMAGING — CR DG CHEST 1V PORT
1 series · 1 of 1 positions shown · non-contrast
Comparison: 06/06/2014.

CLINICAL DATA: Dyspnea/short of breath.

EXAM:
PORTABLE CHEST - 1 VIEW

[ap]
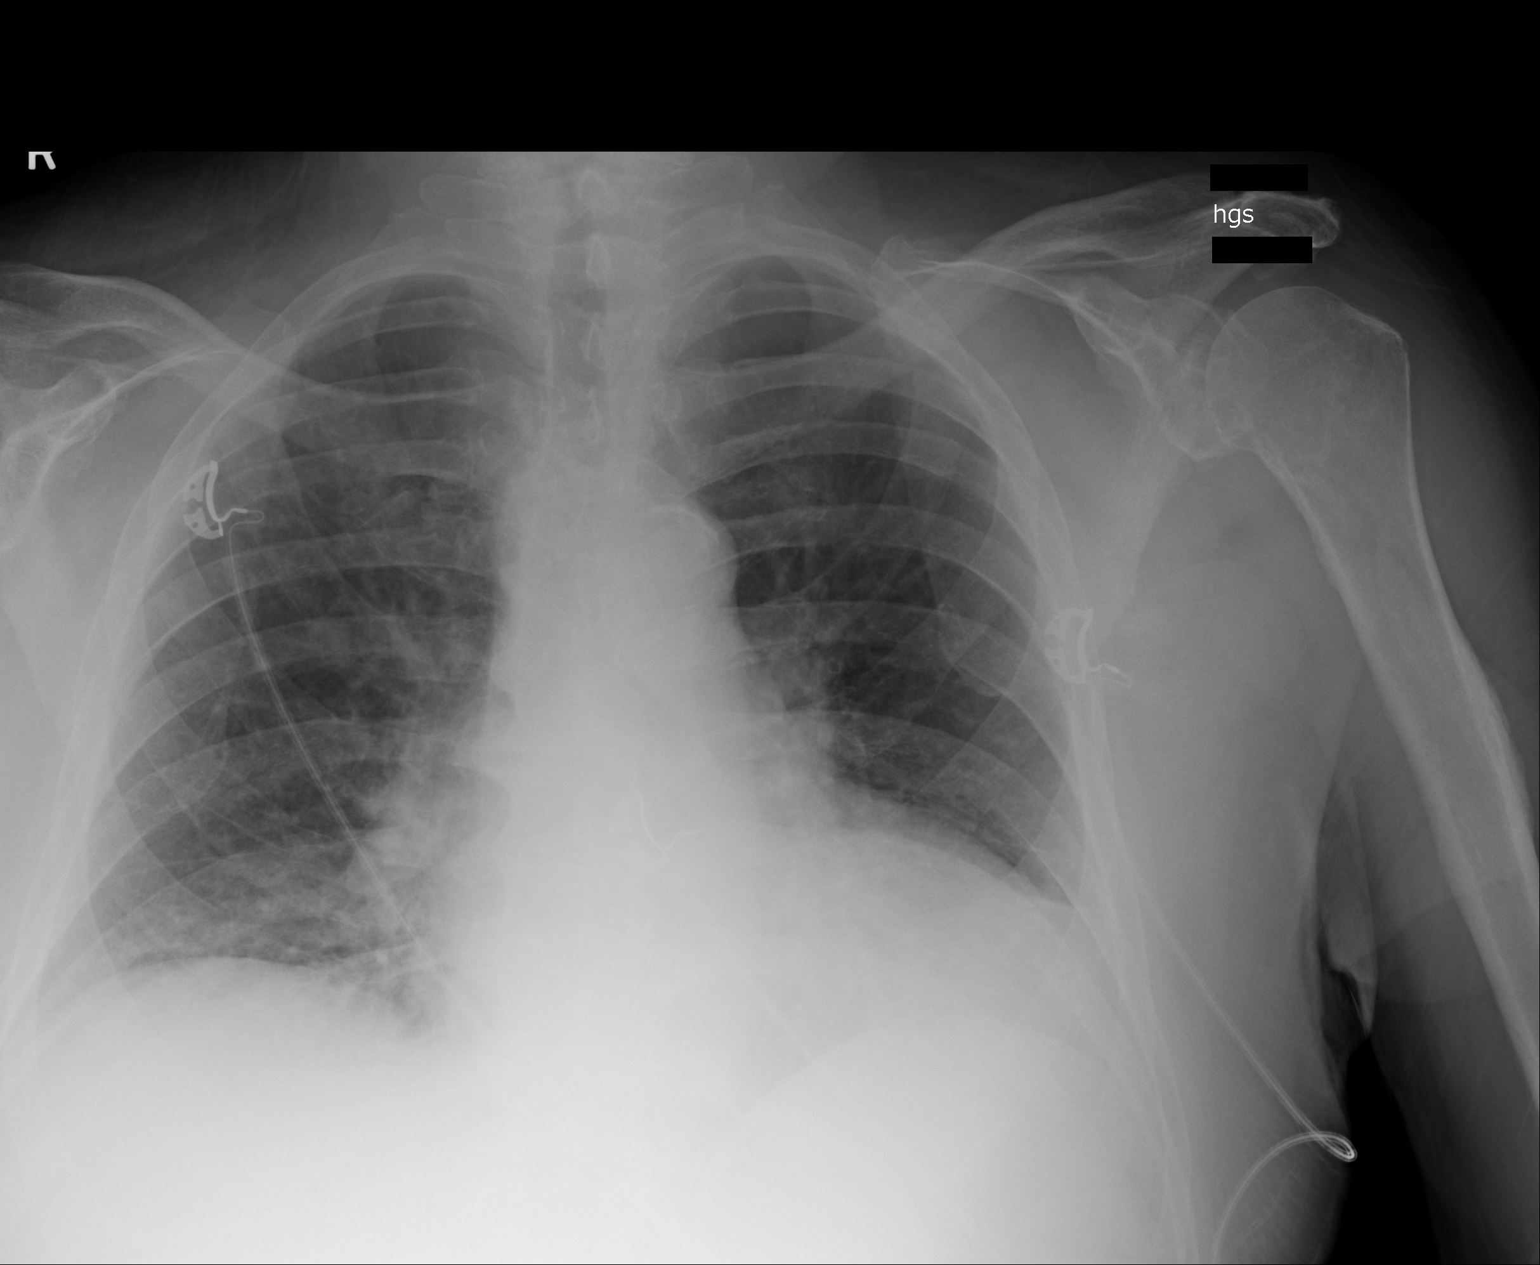

[1 of 1 positions shown; findings below may reference images not displayed]

FINDINGS: Cardiopericardial silhouette is enlarged. Perihilar and basilar
opacity is present, most compatible with pulmonary edema and volume
overload. Bilateral basilar atelectasis is present. Aortic arch
atherosclerosis. Mediastinal contours are unchanged. Monitoring
leads project over the chest.
IMPRESSION: Cardiomegaly and basilar airspace disease most compatible with mild
CHF.
